# Patient Record
Sex: Male | Born: 1960 | Race: White | Hispanic: No | Smoking: Former smoker
Health system: Southern US, Community
[De-identification: ages and names within clinical notes are randomized; demographics above are authoritative.]

## PROBLEM LIST (undated history)

## (undated) DIAGNOSIS — F32A Depression, unspecified: Secondary | ICD-10-CM

## (undated) DIAGNOSIS — K219 Gastro-esophageal reflux disease without esophagitis: Secondary | ICD-10-CM

## (undated) DIAGNOSIS — E785 Hyperlipidemia, unspecified: Secondary | ICD-10-CM

## (undated) DIAGNOSIS — K449 Diaphragmatic hernia without obstruction or gangrene: Secondary | ICD-10-CM

## (undated) DIAGNOSIS — I2510 Atherosclerotic heart disease of native coronary artery without angina pectoris: Secondary | ICD-10-CM

## (undated) DIAGNOSIS — G4730 Sleep apnea, unspecified: Secondary | ICD-10-CM

## (undated) DIAGNOSIS — F419 Anxiety disorder, unspecified: Secondary | ICD-10-CM

## (undated) DIAGNOSIS — F1910 Other psychoactive substance abuse, uncomplicated: Secondary | ICD-10-CM

## (undated) DIAGNOSIS — G8929 Other chronic pain: Secondary | ICD-10-CM

## (undated) DIAGNOSIS — J449 Chronic obstructive pulmonary disease, unspecified: Secondary | ICD-10-CM

## (undated) DIAGNOSIS — M1990 Unspecified osteoarthritis, unspecified site: Secondary | ICD-10-CM

## (undated) DIAGNOSIS — I10 Essential (primary) hypertension: Secondary | ICD-10-CM

## (undated) DIAGNOSIS — D869 Sarcoidosis, unspecified: Secondary | ICD-10-CM

## (undated) DIAGNOSIS — F329 Major depressive disorder, single episode, unspecified: Secondary | ICD-10-CM

## (undated) DIAGNOSIS — G35 Multiple sclerosis: Secondary | ICD-10-CM

## (undated) DIAGNOSIS — K222 Esophageal obstruction: Secondary | ICD-10-CM

## (undated) HISTORY — DX: Anxiety disorder, unspecified: F41.9

## (undated) HISTORY — DX: Sleep apnea, unspecified: G47.30

## (undated) HISTORY — DX: Diaphragmatic hernia without obstruction or gangrene: K44.9

## (undated) HISTORY — DX: Hyperlipidemia, unspecified: E78.5

## (undated) HISTORY — DX: Major depressive disorder, single episode, unspecified: F32.9

## (undated) HISTORY — DX: Depression, unspecified: F32.A

## (undated) HISTORY — DX: Esophageal obstruction: K22.2

## (undated) HISTORY — DX: Atherosclerotic heart disease of native coronary artery without angina pectoris: I25.10

## (undated) HISTORY — DX: Sarcoidosis, unspecified: D86.9

## (undated) HISTORY — DX: Other chronic pain: G89.29

---

## 2001-07-06 ENCOUNTER — Ambulatory Visit (HOSPITAL_COMMUNITY): Admission: RE | Admit: 2001-07-06 | Discharge: 2001-07-06 | Payer: Self-pay | Admitting: Internal Medicine

## 2001-07-06 ENCOUNTER — Encounter: Payer: Self-pay | Admitting: Internal Medicine

## 2003-10-05 HISTORY — PX: CARDIAC CATHETERIZATION: SHX172

## 2003-10-11 ENCOUNTER — Inpatient Hospital Stay (HOSPITAL_COMMUNITY): Admission: EM | Admit: 2003-10-11 | Discharge: 2003-10-14 | Payer: Self-pay | Admitting: Emergency Medicine

## 2005-05-01 ENCOUNTER — Emergency Department (HOSPITAL_COMMUNITY): Admission: EM | Admit: 2005-05-01 | Discharge: 2005-05-01 | Payer: Self-pay | Admitting: Emergency Medicine

## 2006-01-20 ENCOUNTER — Ambulatory Visit: Payer: Self-pay | Admitting: Orthopedic Surgery

## 2006-02-17 ENCOUNTER — Ambulatory Visit: Payer: Self-pay | Admitting: Orthopedic Surgery

## 2006-04-11 ENCOUNTER — Ambulatory Visit (HOSPITAL_COMMUNITY): Admission: RE | Admit: 2006-04-11 | Discharge: 2006-04-11 | Payer: Self-pay | Admitting: Family Medicine

## 2007-01-13 ENCOUNTER — Inpatient Hospital Stay (HOSPITAL_COMMUNITY): Admission: EM | Admit: 2007-01-13 | Discharge: 2007-01-14 | Payer: Self-pay | Admitting: Emergency Medicine

## 2007-01-20 ENCOUNTER — Emergency Department (HOSPITAL_COMMUNITY): Admission: EM | Admit: 2007-01-20 | Discharge: 2007-01-20 | Payer: Self-pay | Admitting: Emergency Medicine

## 2008-05-20 ENCOUNTER — Encounter: Payer: Self-pay | Admitting: Orthopedic Surgery

## 2008-10-04 DIAGNOSIS — G35 Multiple sclerosis: Secondary | ICD-10-CM

## 2008-10-04 HISTORY — DX: Multiple sclerosis: G35

## 2009-01-26 ENCOUNTER — Inpatient Hospital Stay (HOSPITAL_COMMUNITY): Admission: EM | Admit: 2009-01-26 | Discharge: 2009-01-30 | Payer: Self-pay | Admitting: Emergency Medicine

## 2009-02-05 ENCOUNTER — Encounter (HOSPITAL_COMMUNITY): Admission: RE | Admit: 2009-02-05 | Discharge: 2009-03-07 | Payer: Self-pay | Admitting: Neurology

## 2009-10-04 HISTORY — PX: COLONOSCOPY: SHX174

## 2009-12-15 ENCOUNTER — Encounter: Payer: Self-pay | Admitting: Internal Medicine

## 2009-12-17 ENCOUNTER — Ambulatory Visit: Payer: Self-pay | Admitting: Internal Medicine

## 2009-12-17 ENCOUNTER — Ambulatory Visit (HOSPITAL_COMMUNITY): Admission: RE | Admit: 2009-12-17 | Discharge: 2009-12-17 | Payer: Self-pay | Admitting: Internal Medicine

## 2010-09-15 ENCOUNTER — Encounter
Admission: RE | Admit: 2010-09-15 | Discharge: 2010-09-15 | Payer: Self-pay | Source: Home / Self Care | Attending: Neurology | Admitting: Neurology

## 2010-10-05 ENCOUNTER — Encounter
Admission: RE | Admit: 2010-10-05 | Discharge: 2010-11-03 | Payer: Self-pay | Source: Home / Self Care | Attending: Neurology | Admitting: Neurology

## 2010-11-03 NOTE — Letter (Signed)
Summary: TRIAGE ORDER  TRIAGE ORDER   Imported By: Ave Filter 12/15/2009 11:53:12  _____________________________________________________________________  External Attachment:    Type:   Image     Comment:   External Document

## 2011-01-13 LAB — CBC
HCT: 39.4 % (ref 39.0–52.0)
HCT: 40.6 % (ref 39.0–52.0)
HCT: 41.5 % (ref 39.0–52.0)
Hemoglobin: 14.3 g/dL (ref 13.0–17.0)
MCHC: 35 g/dL (ref 30.0–36.0)
MCHC: 35 g/dL (ref 30.0–36.0)
MCHC: 36 g/dL (ref 30.0–36.0)
MCV: 93 fL (ref 78.0–100.0)
MCV: 93.8 fL (ref 78.0–100.0)
Platelets: 175 10*3/uL (ref 150–400)
Platelets: 202 10*3/uL (ref 150–400)
RBC: 4.33 MIL/uL (ref 4.22–5.81)
RDW: 13.2 % (ref 11.5–15.5)
RDW: 13.3 % (ref 11.5–15.5)
WBC: 7.7 10*3/uL (ref 4.0–10.5)

## 2011-01-13 LAB — COMPREHENSIVE METABOLIC PANEL
ALT: 22 U/L (ref 0–53)
AST: 23 U/L (ref 0–37)
Albumin: 3.7 g/dL (ref 3.5–5.2)
Albumin: 3.9 g/dL (ref 3.5–5.2)
BUN: 11 mg/dL (ref 6–23)
BUN: 14 mg/dL (ref 6–23)
Calcium: 9.2 mg/dL (ref 8.4–10.5)
Chloride: 108 mEq/L (ref 96–112)
Chloride: 110 mEq/L (ref 96–112)
GFR calc Af Amer: >60 mL/min (ref >60)
GFR calc non Af Amer: >60 mL/min (ref >60)
Glucose, Bld: 147 mg/dL — ABNORMAL HIGH (ref 70–99)
Potassium: 3.4 mEq/L — ABNORMAL LOW (ref 3.5–5.1)
Potassium: 3.7 mEq/L (ref 3.5–5.1)
Sodium: 141 mEq/L (ref 135–145)
Total Protein: 6.1 g/dL (ref 6.0–8.3)

## 2011-01-13 LAB — MULTIPLE SCLEROSIS PANEL 2
Albumin Index: 5 (ref <9.0)
IgA MSPROF: <0.001 mg/dL (ref <0.010)
IgG Total CSF: 5.6 mg/dL (ref 0.5–6.1)
IgM Total: 57.8 mg/dL (ref 48.0–271.0)
IgM-CSF: 0.07 mg/dL (ref <0.10)
Myelin basic protein, csf: <2 ng/mL (ref <4.1)

## 2011-01-13 LAB — DIFFERENTIAL
Basophils Absolute: 0 10*3/uL (ref 0.0–0.1)
Basophils Relative: 0 % (ref 0–1)
Basophils Relative: 0 % (ref 0–1)
Basophils Relative: 0 % (ref 0–1)
Eosinophils Absolute: 0 10*3/uL (ref 0.0–0.7)
Eosinophils Absolute: 0.1 10*3/uL (ref 0.0–0.7)
Eosinophils Absolute: 0.1 10*3/uL (ref 0.0–0.7)
Eosinophils Relative: 0 % (ref 0–5)
Eosinophils Relative: 1 % (ref 0–5)
Lymphocytes Relative: 20 % (ref 12–46)
Lymphocytes Relative: 38 % (ref 12–46)
Lymphs Abs: 0.7 10*3/uL (ref 0.7–4.0)
Lymphs Abs: 2.5 10*3/uL (ref 0.7–4.0)
Monocytes Absolute: 0.6 10*3/uL (ref 0.1–1.0)
Monocytes Relative: 2 % — ABNORMAL LOW (ref 3–12)
Neutro Abs: 3.4 10*3/uL (ref 1.7–7.7)
Neutrophils Relative %: 50 % (ref 43–77)
Neutrophils Relative %: 93 % — ABNORMAL HIGH (ref 43–77)

## 2011-01-13 LAB — PROTIME-INR: INR: 1 (ref 0.00–1.49)

## 2011-01-13 LAB — BASIC METABOLIC PANEL
BUN: 11 mg/dL (ref 6–23)
BUN: 8 mg/dL (ref 6–23)
CO2: 23 mEq/L (ref 19–32)
CO2: 26 mEq/L (ref 19–32)
Chloride: 109 mEq/L (ref 96–112)
Creatinine, Ser: 0.92 mg/dL (ref 0.4–1.5)
GFR calc non Af Amer: >60 mL/min (ref >60)
Glucose, Bld: 104 mg/dL — ABNORMAL HIGH (ref 70–99)
Potassium: 3.6 mEq/L (ref 3.5–5.1)
Potassium: 3.7 mEq/L (ref 3.5–5.1)

## 2011-01-13 LAB — ETHANOL: Alcohol, Ethyl (B): <5 mg/dL (ref 0–10)

## 2011-01-13 LAB — APTT: aPTT: 28 seconds (ref 24–37)

## 2011-01-13 LAB — URINALYSIS, ROUTINE W REFLEX MICROSCOPIC
Hgb urine dipstick: NEGATIVE
Nitrite: NEGATIVE
Protein, ur: NEGATIVE mg/dL
Urobilinogen, UA: 0.2 mg/dL (ref 0.0–1.0)
pH: 5.5 (ref 5.0–8.0)

## 2011-01-13 LAB — RPR: RPR Ser Ql: NONREACTIVE

## 2011-01-13 LAB — RAPID URINE DRUG SCREEN, HOSP PERFORMED
Barbiturates: NOT DETECTED
Cocaine: POSITIVE — AB
Opiates: NOT DETECTED

## 2011-01-13 LAB — PROTEIN AND GLUCOSE, CSF
Glucose, CSF: 75 mg/dL (ref 43–76)
Total  Protein, CSF: 41 mg/dL (ref 15–45)

## 2011-01-13 LAB — FERRITIN: Ferritin: 184 ng/mL (ref 22–322)

## 2011-01-13 LAB — IRON: Iron: 69 ug/dL (ref 42–135)

## 2011-01-13 LAB — CSF CELL COUNT WITH DIFFERENTIAL

## 2011-01-13 LAB — ANA: Anti Nuclear Antibody(ANA): NEGATIVE

## 2011-01-13 LAB — TSH: TSH: 1.844 u[IU]/mL (ref 0.350–4.500)

## 2011-01-18 ENCOUNTER — Ambulatory Visit (HOSPITAL_COMMUNITY)
Admission: RE | Admit: 2011-01-18 | Discharge: 2011-01-18 | Disposition: A | Discharge status: Home / Self Care | Payer: Medicare Other | Source: Ambulatory Visit | Attending: Family Medicine | Admitting: Family Medicine

## 2011-01-18 ENCOUNTER — Other Ambulatory Visit (HOSPITAL_COMMUNITY): Payer: Self-pay | Admitting: Family Medicine

## 2011-01-18 DIAGNOSIS — M25511 Pain in right shoulder: Secondary | ICD-10-CM

## 2011-01-18 DIAGNOSIS — T1490XA Injury, unspecified, initial encounter: Secondary | ICD-10-CM

## 2011-01-18 DIAGNOSIS — M25519 Pain in unspecified shoulder: Secondary | ICD-10-CM | POA: Insufficient documentation

## 2011-02-16 NOTE — Consult Note (Signed)
NAME:  Chad Vasquez, Chad Vasquez               ACCOUNT NO.:  [id number]   MEDICAL RECORD NO.:  [id number]          PATIENT TYPE:  INP   LOCATION:  A332                          FACILITY:  [facility]   PHYSICIAN:  Kofi A. Gerilyn Pilgrim, M.D. DATE OF BIRTH:  30-Dec-1960   DATE OF CONSULTATION:  DATE OF DISCHARGE:                                 CONSULTATION   REASON FOR CONSULTATION:  Weakness of the upper extremities and legs.   The patient is a 50 year old white male who reports pain and pain up in  her right shoulder and the right upper extremity.  Additionally he  complains of flexure of the fingers on the right, symptoms have been  present for 3 days.  The patient does report over a 3-year history of  gait impairment which has impaired due his ability to be employed.  He  believes that the gait impairment has gotten worse.   PAST MEDICAL HISTORY:  Significant for:  1. Hypertension.  2. Pneumonia.   ADMISSION MEDICATION:  Atenolol 50 mg once daily.   ALLERGIES:  None known.   SOCIAL HISTORY:  Smokes, occasional drinker.  Apparently no illicit drug  use, although his urine drug screen shows positive marijuana.  The  patient is married.   REVIEW OF SYSTEMS:  As stated in the history of present illness,  otherwise unrevealing.   PHYSICAL EXAMINATION:  Shows a tall somewhat unkempt man in no acute  distress.  Temperature 97.4 pulse 64, respirations 20, blood pressure  136/83.  HEENT EVALUATION:  Neck is supple.  Head is normocephalic, atraumatic.  ABDOMEN:  Soft.  EXTREMITIES:  Some varicosities noted in the legs.  There may also be  evidence of track marks that raise the possibility of IV drug use.  MENTATION:  The patient is awake and alert.  Speech, language and  cognition are intact.  Cranial nerve evaluation:  Pupils are equal,  round, and reactive to light and accommodation.  Extraocular movements  are full.  Facial muscle strength is symmetric.  Tongue is midline.  Uvula midline.  Shoulder  shrugs normal.  MOTOR EXAMINATION:  Shows normal tone, bulk and strength of the upper  extremities.  Legs:  He has a significant left foot drop.  Strength is  graded at 3- on dorsiflexion.  Other motor groups of the legs are  normal.  Reflexes shows brisk responses, especially in the upper  extremities with finger flexures noted indicate spread.  Plantar  reflexes are, however, equivocal.  Coordination shows no rigidity.  There is no parkinsonism.  No tremors of dysmetria.  Gait is wide based,  slightly spastic,   LABORATORY EVALUATION:  Sodium 141, potassium 3.4, chloride 108, CO2 23,  glucose 147, BUN 14, creatinine 0.8, calcium 9.2.  Liver enzymes normal.  Urine drug screen positive for cocaine.  Urinalysis negative.  Head CT  scan is reviewed in person and shows left frontal hypodensity.   ASSESSMENT:  1. Gait impairment.  2. Acute right upper extremity weakness which seems mostly due to      pain, although the suggestion of upper and lower extremity weakness  suggests the possibility of cervical myelopathy.  Issue of stroke      was raised, but I do not think the presentation makes this high on      the differential, although it is always a possibility.  The patient      does seem to have significant hypodensity on imaging, especially in      the left frontal area.  This may indicate an old stroke, chronic      ischemia, or even demyelination process.   RECOMMENDATION:  We will order an MRI of the head and neck, and further  testing will depend on the initial workup.  We will also do extensive  blood testing which has been started already.  We will also add  homocysteine.      Kofi A. Gerilyn Pilgrim, M.D.  Electronically Signed     KAD/MEDQ  D:  01/27/2009  T:  01/27/2009  Job:  [id number]

## 2011-02-16 NOTE — Consult Note (Signed)
NAME:  Chad Vasquez, Chad Vasquez               ACCOUNT NO.:  [id number]   MEDICAL RECORD NO.:  [id number]          PATIENT TYPE:  INP   LOCATION:  A332                          FACILITY:  [facility]   PHYSICIAN:  Kofi A. Gerilyn Pilgrim, M.D. DATE OF BIRTH:  Jan 17, 1961   DATE OF CONSULTATION:  01/28/2009  DATE OF DISCHARGE:                                 CONSULTATION   FOLLOWUP NOTE:   SUBJECTIVE:  The patient has no new complaints, but he continues to have  weakness of the legs, difficulty ambulating.  The right arm pain has  improved, but testing vitamin B12 level it is somewhat on the lower side  at 301, PRP nonreactive, homocysteine level 8.9, ferritin 184, TSH  1.8244.   OBJECTIVE:  Temperature 97.7, respirations 18, heart rate 59, blood  pressure 126/72.  MRI of the cervical spine and brain is reviewed.   IMPRESSION:  There is a large single scene on flare imaging involving he  cervical spine extending the entire C3-C4 segment, more towards the  right.  There are multiple T1 signals seen in the brain with typical  orientation suggestive of MS plaque.  Several of these line up in a  linear fashion perpendicular to the ventricle.  There is also  periventricular lesions seen, especially on posterior horns bilaterally.  A total of approximately 17 discrete white matter signals are seen.  There are also hypodensities noted on T1 imaging, especially involving  the left frontal area where there is a large black hole seen there, seen  on at least three cuts.  There is also one seen in the posterior  occipital parietal area adjacent to the posterior horn.  The findings  are versutus of a multiple sclerosis.   ASSESSMENT/PLAN:  Multiple sclerosis.  Findings were discussed at length  with the patient. We will arrange for the patient to undergo a lumbar  spinal tap.  Would also start the patient on Solu-Medrol 1 g for three  days IV.  He will eventually need to be placed on a long-term  immunomodulating  agent which should be arranged, but there is going to  be significant limitation as the patient does not have insurance.  For  now, we will continue with steroids.      Kofi A. Gerilyn Pilgrim, M.D.  Electronically Signed     KAD/MEDQ  D:  01/28/2009  T:  01/28/2009  Job:  [id number]

## 2011-02-16 NOTE — Discharge Summary (Signed)
NAMEPRENTICE, SACKRIDER               ACCOUNT NO.:  [id number]   MEDICAL RECORD NO.:  [id number]          PATIENT TYPE:  INP   LOCATION:  A332                          FACILITY:  [facility]   PHYSICIAN:  Dorris Singh, DO    DATE OF BIRTH:  11/20/1960   DATE OF ADMISSION:  01/26/2009  DATE OF DISCHARGE:  04/29/2010LH                               DISCHARGE SUMMARY   PRIMARY CARE PHYSICIAN:  Dr. Nobie Putnam.   CONSULTS THAT WERE MADE:  Include Dr. Gerilyn Pilgrim.   ADMISSION DIAGNOSES:  Include:  1. Generalized body weakness.  2. Unsteady gait for several years' duration.  3. History of poorly controlled hypertension.  4. History of polysubstance abuse including tobacco, alcohol, cocaine,      marijuana.  A urine screen was positive for acute cocaine.   DISCHARGE DIAGNOSES:  1. Multiple sclerosis.  2. Unsteady gait.  3. History of poorly controlled hypertension.  4. History of polysubstance abuse.   TESTING THAT WAS DONE:  Includes radiology testing.  CT of the head  without contrast which demonstrated hypoattenuation and subcortical  periventricular deep white matter that could be due to chronic  microvascular disease or demyelinating process.  The appearance has not  markedly changes and there is no acute finding.  He had MRI of the spine  which shows findings suspicious for MS for the cervical cord of C3-C4.  Additional area of suspicion for cord involvement dorsally of T3-T2.  No  significant compressive disk pathology.  MRI of the brain on January 27, 2009, same thing and he had a fluoroscopic-guided lumbar puncture on  January 28, 2009.   HOSPITAL COURSE:  Patient was admitted to the service of [facility] with  the above diagnoses.  Dr. Gerilyn Pilgrim was consulted.  An MRI and MRA were  obtained as mentioned above.  Also, patient received a spinal tap while  he was here.   PERTINENT FINDINGS FOR HIS LABS:  He had a positive urine drug screen  for cocaine and had an RPR done which was  nonreactive.  An ESR which was  2.  His cerebrospinal fluid was within normal range.   HOSPITAL COURSE:  He was started on high-dose steroids.  Dr. Gerilyn Pilgrim  put him on IV Solu-Medrol 1000 mg every 24 hours.  At one point in time,  patient was concerned and he wanted to go home.  Discussed with him the  possibility of him staying for his last dose.  He was on it for 3 days.  Today, patient states that he is feeling much better.  He feels a little  bit more steady in his gait.  Also, he has talked with home health care  and case management.  They have decided to set him up for outpatient PT  here at the hospital.  They have more equipment for him to be able to  work at regaining his gait.   CURRENT VITALS:  For today are as follows:  Temperature 98.4.  Pulse 66.  Respirations 20.  Blood pressure 141/84.  GENERAL:  Patient is a Caucasian male who is well developed, well  nourished, no acute distress.  HEART:  Regular rate and rhythm.  LUNGS:  Clear to auscultation bilaterally.  ABDOMEN:  Soft, nontender, nondistended.  EXTREMITIES:  Positive pulses.   There are no labs ordered for today.   MEDICATIONS THAT HE WILL BE SENT HOME ON:  Include his atenolol 50 mg  p.o. daily.   DISCHARGE INSTRUCTIONS:  Are to increase his activity slowly, to be on a  low-sodium, heart-healthy diet, to see Dr. Gerilyn Pilgrim in 2 to 4 weeks and  Dr. Nobie Putnam within 1 to 7 days.  He is to continue outpatient PT and to  follow up with neurology as recommended.  The patient also mentioned  that he will probably try to obtain disability and he can do this  through his primary care office.   CONDITION AT DISCHARGE:  Stable.   DISPOSITION:  Will be to home.      Dorris Singh, DO  Electronically Signed     CB/MEDQ  D:  01/30/2009  T:  01/30/2009  Job:  [id number]

## 2011-02-16 NOTE — Group Therapy Note (Signed)
NAME:  Chad Vasquez, Chad Vasquez               ACCOUNT NO.:  [id number]   MEDICAL RECORD NO.:  [id number]          PATIENT TYPE:  INP   LOCATION:  A332                          FACILITY:  [facility]   PHYSICIAN:  Kofi A. Gerilyn Pilgrim, M.D. DATE OF BIRTH:  June 20, 1961   DATE OF PROCEDURE:  DATE OF DISCHARGE:                                 PROGRESS NOTE   The patient is sitting up in a chair.  He is awake and alert and does  not have any new complaints, although he reports having a lot of pain  involving the ankles essentially where he has significant varicose veins  there.  He is awake and alert and converses fairly well.  Again, the  ankles had significant varicose veins especially along the left  posterior area.  Spinal fluid results are reviewed and protein is 41,  glucose is 78, WBC 6, RBC 3, other CSF results are pending.  C-reactive  protein 0.   ASSESSMENT AND PLAN:  Multiple sclerosis.  Continue with the Solu-  Medrol.  He is going to get the second dose today, third dose tomorrow.  He could be discharged afterwards.  Continue with physical therapy.      Kofi A. Gerilyn Pilgrim, M.D.  Electronically Signed     KAD/MEDQ  D:  01/29/2009  T:  01/30/2009  Job:  [id number]

## 2011-02-16 NOTE — Group Therapy Note (Signed)
Chad Vasquez, Chad Vasquez               ACCOUNT NO.:  [id number]   MEDICAL RECORD NO.:  [id number]          PATIENT TYPE:  INP   LOCATION:  A332                          FACILITY:  [facility]   PHYSICIAN:  Dorris Singh, DO    DATE OF BIRTH:  06/02/1961   DATE OF PROCEDURE:  01/29/2009  DATE OF DISCHARGE:                                 PROGRESS NOTE   The patient was seen today.  Earlier, he was sitting.  He was  threatening to leave out against medical advice.  I discussed the case  with Dr. Gerilyn Pilgrim who stated that if he did want to leave, he could go  and have his steroid treatments done in the specialty clinic.  I gave  this option to the patient.  He stated that he would stay because he  lives in [place] and did not want to have to travel between here and  [place], so he will go ahead and stay.  I also discussed with him his  treatment plan for MS, and he will need to follow up with Dr. Gerilyn Pilgrim,  as well.  The patient stated an understanding.   CURRENT PHYSICAL EXAMINATION:  VITAL SIGNS:  Temperature 97.9, pulse 93,  respirations 20, blood pressure 90/50.  GENERAL:  The patient is a well-developed, well-nourished Caucasian male  who is in no acute distress.  Upon entering the room, the patient was  trying to ambulate.  He is unsteady in his gait.  HEART:  Regular rate and rhythm.  LUNGS:  Clear to auscultation bilaterally.  ABDOMEN:  Soft, nontender.  EXTREMITIES:  Positive pulses.  No edema, ecchymosis or cyanosis.   CURRENT LABORATORIES:  White count 13.0, hemoglobin 14.2, hematocrit  40.6, platelet count of 202.  Sodium 139, potassium 3.6, chloride 109,  CO2 of 23, glucose 178, BUN 8, creatinine 0.92.   ASSESSMENT AND PLAN:  Multiple sclerosis.  This was found on MRI.  The  patient will continue with current high-dose steroids IV as per  recommended by Dr. Gerilyn Pilgrim.  Tomorrow is his last day.  Also, we will  set up an appointment with him to see Dr. Gerilyn Pilgrim outpatient for his  long-term therapy.  Will continue to monitor him and change any therapy  as necessary.      Dorris Singh, DO  Electronically Signed     CB/MEDQ  D:  01/29/2009  T:  01/29/2009  Job:  [id number]

## 2011-02-16 NOTE — Group Therapy Note (Signed)
NAME:  Chad Vasquez, Chad Vasquez               ACCOUNT NO.:  [id number]   MEDICAL RECORD NO.:  [id number]          PATIENT TYPE:  INP   LOCATION:  A332                          FACILITY:  [facility]   PHYSICIAN:  Kofi A. Gerilyn Pilgrim, M.D. DATE OF BIRTH:  [birth year]   DATE OF PROCEDURE:  DATE OF DISCHARGE:  01/30/2009                                 PROGRESS NOTE   The patient reports that he actually has improved with the Solu-Medrol.  He still continues to have problems ambulating.  He is to receive his  last dose of Solu-Medrol today.  I did discuss with the patient again  that he should follow up for disability as MS is a progressive disease.  He does report that he has had what appears to be multiple spells of  weakness of the legs and upper extremities and with each case he has  gotten worse each time, which is pretty typical and classic for MS.  He  is to follow up in the office less than about 2-4 weeks, so we can  arrange for the patient to get on an immunomodulating therapy.      Kofi A. Gerilyn Pilgrim, M.D.  Electronically Signed     KAD/MEDQ  D:  01/30/2009  T:  01/30/2009  Job:  [id number]

## 2011-02-16 NOTE — H&P (Signed)
NAMEDAESEAN, LAZARZ               ACCOUNT NO.:  [id number]   MEDICAL RECORD NO.:  [id number]          PATIENT TYPE:  INP   LOCATION:  A332                          FACILITY:  [facility]   PHYSICIAN:  Margaretmary Dys, M.D.DATE OF BIRTH:  1961-08-09   DATE OF ADMISSION:  01/26/2009  DATE OF DISCHARGE:  LH                              HISTORY & PHYSICAL   ADMISSION DIAGNOSES:  1. Generalized body weakness.  2. Unsteady gait, of several years' duration.  3. History of poorly controlled hypertension.  4. History of polysubstance abuse, including tobacco, alcohol, cocaine      and marijuana.  Urine drug screen positive for cocaine today.   CHIEF COMPLAINT:  Generalized weakness.   HISTORY OF PRESENT ILLNESS:  Mr. Minahan is a 50 year old male who  presented to the emergency room complaining of generalized weakness, and  sometimes numbness in his lower extremities and also upper extremities.  He also describes, what appears to be, radiating pain of his right  shoulder and his right upper extremity.  Also, he feels that his legs go  numb on him; this all started about 3 years ago when he lost his job at  [employer].   The patient reports that he is really been having a lot of difficulty  getting around.  The patient is currently unemployed.   He has been collecting unemployment checks on and off.   The patient presented to the emergency room today.  Basically, he could  barely walk or get out the bed.   REVIEW OF SYSTEMS:  Is as mentioned.   HISTORY OF PRESENT ILLNESS:  The patient denies any weight loss.  No  headaches, dizziness or lightheadedness.  No nausea or vomiting.  The  patient denies any visual changes or spots in his eyes.  Denies any  double vision.  He has had no fevers or chills.  He denies any cough.   PAST MEDICAL HISTORY:  1. Hypertension.  2. History of pneumonia.   MEDICATIONS:  Tylenol 50 mg p.o. once a day.   ALLERGIES:  NO KNOWN DRUG  ALLERGIES.   SOCIAL HISTORY:  The patient smokes about 1-2 packs of cigarettes a day.  He does use marijuana and cocaine.  The patient lives with a male  partner.  He denies any history of homosexual activities.   FAMILY HISTORY:  No history of multiple sclerosis or ALS in the family.   PHYSICAL EXAMINATION:  The patient was conscious, alert, although  somewhat unkempt and chronically ill-looking.  VITAL SIGNS:  Blood pressure 136/83 with a pulse of 66, respiratory rate  was 18, temperature 97.5 degrees Fahrenheit, oxygen saturation was 98%  on room air.  HEENT:  Normocephalic, atraumatic.  Oral mucosa was moist.  No exudates  were noted.  NECK:  Supple.  No JVD or lymphadenopathy.  LUNGS:  Clear clinically.  Good air entry bilaterally.  HEART:  S1 and S2 regular.  No excessive gallops or rubs.  ABDOMEN:  Soft, nondistended.  Bowel sounds positive.  No masses  palpable.  EXTREMITIES:  Showed some varicose veins bilaterally.  CNS EXAM:  The patient was conscious, alert; was well oriented in time,  place and person.  He had no focal neurological deficits.  The patient  did give a subjective loss of sensation in the left lower extremity  greater than the right.  His reflexes were brisk in both lower  extremities.  The patient does not have any evidence of tremors.  He had  no pronator drift.   LABORATORY/DIAGNOSTIC DATA:  White blood cell count 7.7, hemoglobin  14.6, hematocrit 41.5, platelet count 223.  PT 13.5, INR 1.0.  Sodium  141, potassium 3.4, chloride 108, CO2 23, glucose 147, BUN 14,  creatinine 0.95.  AST 23, ALT 25.  Albumin 3.9, calcium 9.2.  Ammonia  level was reported to be 54.  Urine drug screen was positive for  cocaine.  Alcohol level was noted to be less than 5.  Urinalysis was  negative.  A CT scan of the brain suggestive of demyelinating disease.  There was  also some hypoattenuation in subcortical periventricular deep white  matter.   ASSESSMENT AND PLAN:   This is a 50 year old male who presented to the  emergency room with unsteady gait and generalized body weakness.  1. The patient will be admitted to medical floor.  My concerns are      especially for multiple sclerosis; symptoms are highly suggestive.  2. Will obtain a MRI/MRA of the brain and the neck to further evaluate      this.  3. Will request neurology to see the patient, as the patient may      require a spinal tap for oligoclonal bands.   Will put the patient on SCDs for DVT prophylaxis for now, in case the  patient will have a spinal tap.  Would defer to neurology, Dr. Gerilyn Pilgrim, for any additional testing that  is thus required.  I explained the plan to the patient and he verbalized  full understanding.      Margaretmary Dys, M.D.  Electronically Signed     AM/MEDQ  D:  01/27/2009  T:  01/27/2009  Job:  [id number]

## 2011-02-19 NOTE — Discharge Summary (Signed)
Chad Vasquez, Chad Vasquez               ACCOUNT NO.:  [id number]   MEDICAL RECORD NO.:  [id number]          PATIENT TYPE:  INP   LOCATION:  A220                          FACILITY:  [facility]   PHYSICIAN:  Corrie Mckusick, M.D.  DATE OF BIRTH:  06-Apr-1961   DATE OF ADMISSION:  01/13/2007  DATE OF DISCHARGE:  04/12/2008LH                               DISCHARGE SUMMARY   HISTORY PRESENTING ILLNESS AND PAST MEDICAL HISTORY:  Please see  admission H&P.   HOSPITAL COURSE:  A 50 year old gentleman with cough and questionable  bronchitis in a heavy smoker.  He is admitted to 2A for close monitoring  in telemetry due to the chest tightness.  We ruled him out from a  cardiac standpoint.  EKG was normal on admission.  We restarted his  Vasotec for hypertension.  We gave him Avelox 400 mg IV daily.  He was  started on Xopenex and Spiriva daily worker.  Worked on him not to  smoke.   The day after admission, seen by his primary doctor, Dr. Sherwood Gambler, who felt  like the patient was greatly improved and was ready for discharge.  He  was discharged home on:   1. Avelox 400 mg daily for 10 days.  2. Ventolin tablets two t.i.d.  3. Steroid Dosepak.  4. Albuterol.  5. He also was discharged on his Vasotec 10 mg daily.   DISCHARGE CONDITION:  Improved and stable.  Please see Dr. Sharyon Medicus note  on the day of discharge for details.      Corrie Mckusick, M.D.  Electronically Signed     JCG/MEDQ  D:  02/08/2007  T:  02/08/2007  Job:  [id number]

## 2011-02-19 NOTE — H&P (Signed)
NAMECLEBURN, MAIOLO               ACCOUNT NO.:  [id number]   MEDICAL RECORD NO.:  [id number]          PATIENT TYPE:  INP   LOCATION:  A220                          FACILITY:  [facility]   PHYSICIAN:  Corrie Mckusick, M.D.  DATE OF BIRTH:  1960-12-08   DATE OF ADMISSION:  01/13/2007  DATE OF DISCHARGE:  LH                              HISTORY & PHYSICAL   ADMITTING DIAGNOSIS:  Bronchitis and hypoxia.   HISTORY OF PRESENTING ILLNESS:  This is a 50 year old gentleman with  heavy smoking history who has also got hypertension who presents with  now 6-8 hours of cough, fever and achiness.  He said he woke up this  morning with quite a bit of congestion and cough and felt his whole body  aching.  He also had some mid chest pain that seemed to be more related  to when he coughed.  There was some mild shortness of breath.  There was  no radiation of this pain to the arm or to the neck.  No associated  nausea.  He said it was just more or less tightness and some mild  shortness of breath.   No sick contacts really, although he thought he might have been exposed  to someone with bronchitis a couple of weeks ago.  He did not have the  influenza vaccine this year.  He has also not been taking his  antihypertensives regularly.  He does smoke heavily as stated.   GI, GU review of systems are otherwise negative.   PAST MEDICAL HISTORY:  Hypertension.   PAST SURGICAL HISTORY:  Heart catheterization in January 2005 which  showed some partial, really nonsignificant disease with some mild  narrowing in around the 20% range.   MEDICATIONS:  Vasotec 10 mg daily.   ALLERGIES:  KEFLEX.   He smokes two packs a day as well as cigars, has cut down on his alcohol  intake although he does drink some alcohol, he states.  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   FAMILY HISTORY:  Significant for heart disease with his father having a  CABG in his 19s.  There is also some diabetes in the  family.   PHYSICAL EXAMINATION:  VITAL SIGNS:  Temperature 98.5, blood pressure  154/74 off medications, heart rate 92, respiratory rate 22, O2  saturation 93% on room air.  GENERAL:  When I saw him he was pleasant, talkative, felt somewhat  better after his antibiotics.  HEENT:  Nasopharynx clear with moist mucous membranes.  NECK:  Supple.  No lymphadenopathy.  CHEST:  There are some upper rhonchi heard throughout.  There are no  areas of rales or consolidation.  CARDIOVASCULAR:  Regular rate and rhythm, normal S1 and S2, no murmur,  gallops, rubs.  ABDOMEN:  Soft, nontender, nondistended.  EXTREMITIES:  No edema.  The pulses are +1 bilaterally.   LABORATORIES:  Sodium 135, potassium 3.6, chloride 105, bicarb 23,  glucose 126, BUN 6, creatinine 0.84.  Point-of-care markers were  negative.  White count is elevated at 22.9, hemoglobin of 14.4,  platelets  of 272, ANC of 21.7.  Urinalysis overall clear.  Blood gas on  room air showed a pH of 7.45, pCO2 of 35, pO2 of 59, bicarb of 24.   RADIOLOGY:  The chest x-ray was read as negative.  We decided to a chest  CT to rule out PE which was reportedly negative by the emergency  department.   ASSESSMENT AND PLAN:  This is a 50 year old gentleman with body aches,  cough and questionable bronchitis, possibly due to heavy smoking.   PLAN:  1. Admit to 2A for close monitoring including telemetry due to the      chest tightness.  2. Will rule out from a cardiac standpoint.  It should be noted that      his EKG in the emergency department was completely normal per the      ER physician.  3. Restart his Vasotec at 10 mg daily.  4. On the antibiotics he received Rocephin and azithromycin initially      but will change this to Avelox 400 mg daily due to a CEPHALOSPORIN      allergy.  5. Will start Xopenex nebulizers q.8h.  6. Start Spiriva daily.  7. With do an influenza test as this could be quite possibly the flu.      Will hold off on  using antivirals until this result returns.  8. Will work on him to not smoke.  9. Have talked to him about his alcohol intake.  He has had no history      of DTs and says that he is drinking much less now, so he is not      that concerned that he would have any signs or symptoms of      withdrawal, but we will watch this closely of course.   Will continue follow closely.      Corrie Mckusick, M.D.  Electronically Signed     JCG/MEDQ  D:  01/13/2007  T:  01/14/2007  Job:  [id number]

## 2011-02-19 NOTE — Discharge Summary (Signed)
NAME:  Chad Vasquez, Chad Vasquez                         ACCOUNT NO.:  [id number]   MEDICAL RECORD NO.:  [id number]                   PATIENT TYPE:  INP   LOCATION:  3734                                 FACILITY:  [facility]   PHYSICIAN:  Vida Roller, M.D.                DATE OF BIRTH:  08-23-1961   DATE OF ADMISSION:  10/11/2003  DATE OF DISCHARGE:  10/14/2003                           DISCHARGE SUMMARY - REFERRING   REASON FOR ADMISSION:  Please refer to dictated admission history noted.   LABORATORY DATA:  Serial cardiac markers normal.  Lipid profile:  Total  cholesterol 138, triglycerides 162, HDL 35, LDL 71 (cholesterol/HDL ratio  3.9).  CBC normal at discharge.  Electrolytes and renal function normal.  Normal liver enzymes.   ADMISSION CHEST X-RAY:  Left base atelectasis.  Tiny right upper lobe nodule  (probable granuloma).  Followup 59-month chest x-ray recommended.   HOSPITAL COURSE:  Following presentation to the emergency room with a  complaint of a one-week history of intermittent chest tightness and  progressive exertional dyspnea, patient ruled out for myocardial infarction  with all serial cardiac markers within normal limits.  He was maintained on  aspirin, beta blocker, and intravenous heparin with plans to proceed with  diagnostic coronary angiography.  He was also started on low-dose statin.   The patient remained stable over the weekend with no complaint of chest pain  and underwent coronary angiography on Monday, January 10th (see report for  full details) with procedure performed with Dr. Veneda Melter.  The patient  was found to have mild, noncritical coronary artery disease with preserved  left ventricular function.  There was 20% distal along the LCX, mild LAD and  first diagonal disease, and 50% proximal with 30% mid and distal RCA  disease.  Dr. Chales Abrahams recommended medical therapy and treatment with a proton  pump inhibitor for six weeks.   The patient was cleared for  discharge later that same day, ambulating, and  with no complications of the right groin incision site.   DISCHARGE MEDICATIONS:  1. Protonix 40 mg daily x6 weeks.  2. Coated aspirin 81 mg daily.  3. Metoprolol 50 mg daily.  4. Lotrel 5/10 mg daily.  5. Lipitor 10 mg daily.  6. Nitrostat 0.4 mg p.r.n.   INSTRUCTIONS:  No heavy lifting x2 days.  Maintain a low fat, low  cholesterol diet.  Call the office if there is any swelling/bleeding of the  groin.   The patient is instructed to arrange followup with his primary care  physician, Dr. Patrica Duel, in the next two weeks.  He will return to Dr.  Vida Roller on an as-needed basis.   DISCHARGE DIAGNOSES:  1. Nonischemic chest pain.     A. Normal serial cardiac markers.     B. Nonobstructive coronary artery disease, preserved left ventricular        function.  Coronary angiogram on January  10th.  2. Tiny right apical nodular opacity.     A. Probable calcified granuloma.  Recommend follow-up chest x-ray in        three months.  3. History of hypertension.  4. History of palpitations.     A. Treated with Lopressor.  5. Longstanding tobacco.  6. Mixed dyslipidemia.      Gene Serpe, P.A. [facility]                      Vida Roller, M.D.    GS/MEDQ  D:  10/14/2003  T:  10/14/2003  Job:  [id number]   cc:   Patrica Duel, M.D.  [address]  [place]  [place] [address]  Fax: [phone]
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
## 2011-02-19 NOTE — Cardiovascular Report (Signed)
NAME:  TANIS, BURNLEY                         ACCOUNT NO.:  [id number]   MEDICAL RECORD NO.:  [id number]                   PATIENT TYPE:  INP   LOCATION:  3734                                 FACILITY:  [facility]   PHYSICIAN:  Veneda Melter, M.D.                   DATE OF BIRTH:  [birth year]   DATE OF PROCEDURE:  10/14/2003  DATE OF DISCHARGE:                              CARDIAC CATHETERIZATION   PROCEDURES PERFORMED:  1. Left heart catheterization.  2. Left ventriculogram.  3. Selective coronary angiography.  4. AngioSeal right femoral artery.   DIAGNOSES:  1. Mild coronary artery disease by angiogram.  2. Normal left ventricular systolic function.   HISTORY:  Mr. Ferrando is a 50 year old gentleman with history of tobacco  use, strong family history of coronary disease who presents with severe  substernal chest discomfort.  The patient was admitted to the hospital,  ruled out for acute myocardial infarction.  Presents now for further  assessment.   TECHNIQUE:  Informed consent was obtained.  The patient brought to the  catheterization laboratory.  A 6-French sheath placed in the right femoral  artery using modified Seldinger technique.  A 6-French JL4 and JR4 catheter  was then used to engage the left and right coronary arteries.  Selective  angiography performed in various projections using manual injections of  contrast.  A 6-French pigtail catheter was then advanced in the left  ventricle and left ventriculogram performed using power injections of  contrast.  At the termination of case catheters and sheath were removed and  AngioSeal suture closure device deployed to the right femoral artery until  adequate hemostasis achieved.  The patient tolerated procedure well and was  transferred to floor in stable condition.   FINDINGS:  1. Left main trunk:  Medium caliber vessel.  Mild distal narrowing of 20%.  2. LAD:  This is a medium caliber vessel.  Provides a large first  diagonal     branch in the proximal segment.  The LAD has mild diffuse disease of 20%.  3. Left circumflex artery:  Codominant.  This is a large caliber vessel.     Provides three distal posterior ventricular branches.  The left     circumflex has mild disease of 30% in the mid AV segment.  4. Ramus intermedius:  Small caliber vessel with moderate irregularities.  5. Right coronary artery is dominant.  This is a large caliber vessel that     provides a posterior descending artery in its terminal segment.  The     right coronary artery has moderate concentric narrowing of 50% in the     proximal segment.  There is then further disease of 30% in the mid and     distal segments.  6. LV:  Normal end-systolic and end-diastolic dimensions.  Overall left     ventricular function is well preserved.  Ejection fraction greater than  55%.  No mitral regurgitation.  LV pressure is 120/10.  Aortic is 120/80.     LVEDP equals 20.   ASSESSMENT/PLAN:  Mr. Santucci is a 50 year old gentleman with noncritical  coronary artery disease and well preserved left ventricular function.  Continued medical therapy will be pursued and other cause of chest pain  investigated.                                               Veneda Melter, M.D.    NG/MEDQ  D:  10/14/2003  T:  10/14/2003  Job:  [id number]   cc:   Patrica Duel, M.D.  [address]  [place]  [place] [address]  Fax: [phone]

## 2011-06-14 ENCOUNTER — Other Ambulatory Visit (HOSPITAL_COMMUNITY): Payer: Self-pay | Admitting: Family Medicine

## 2011-06-14 ENCOUNTER — Ambulatory Visit (HOSPITAL_COMMUNITY)
Admission: RE | Admit: 2011-06-14 | Discharge: 2011-06-14 | Disposition: A | Discharge status: Home / Self Care | Payer: Medicare Other | Source: Ambulatory Visit | Attending: Family Medicine | Admitting: Family Medicine

## 2011-06-14 DIAGNOSIS — R079 Chest pain, unspecified: Secondary | ICD-10-CM

## 2011-06-14 DIAGNOSIS — F411 Generalized anxiety disorder: Secondary | ICD-10-CM

## 2011-06-14 DIAGNOSIS — R05 Cough: Secondary | ICD-10-CM | POA: Insufficient documentation

## 2011-06-14 DIAGNOSIS — R0789 Other chest pain: Secondary | ICD-10-CM | POA: Insufficient documentation

## 2011-06-14 DIAGNOSIS — R0602 Shortness of breath: Secondary | ICD-10-CM | POA: Insufficient documentation

## 2011-06-14 DIAGNOSIS — R059 Cough, unspecified: Secondary | ICD-10-CM | POA: Insufficient documentation

## 2011-06-14 DIAGNOSIS — I10 Essential (primary) hypertension: Secondary | ICD-10-CM

## 2011-10-05 DIAGNOSIS — F1910 Other psychoactive substance abuse, uncomplicated: Secondary | ICD-10-CM

## 2011-10-05 HISTORY — DX: Other psychoactive substance abuse, uncomplicated: F19.10

## 2012-02-23 ENCOUNTER — Other Ambulatory Visit (HOSPITAL_COMMUNITY): Payer: Self-pay | Admitting: Family Medicine

## 2012-02-23 DIAGNOSIS — M545 Low back pain, unspecified: Secondary | ICD-10-CM

## 2012-02-23 DIAGNOSIS — I10 Essential (primary) hypertension: Secondary | ICD-10-CM

## 2012-02-23 DIAGNOSIS — E119 Type 2 diabetes mellitus without complications: Secondary | ICD-10-CM

## 2012-02-23 DIAGNOSIS — E785 Hyperlipidemia, unspecified: Secondary | ICD-10-CM

## 2012-02-25 ENCOUNTER — Ambulatory Visit (HOSPITAL_COMMUNITY)
Admission: RE | Admit: 2012-02-25 | Discharge: 2012-02-25 | Disposition: A | Discharge status: Home / Self Care | Payer: Medicare Other | Source: Ambulatory Visit | Attending: Family Medicine | Admitting: Family Medicine

## 2012-02-25 ENCOUNTER — Other Ambulatory Visit (HOSPITAL_COMMUNITY): Payer: Self-pay | Admitting: Family Medicine

## 2012-02-25 DIAGNOSIS — M545 Low back pain, unspecified: Secondary | ICD-10-CM

## 2012-02-25 DIAGNOSIS — M5126 Other intervertebral disc displacement, lumbar region: Secondary | ICD-10-CM | POA: Insufficient documentation

## 2012-02-25 DIAGNOSIS — R209 Unspecified disturbances of skin sensation: Secondary | ICD-10-CM | POA: Insufficient documentation

## 2012-02-25 DIAGNOSIS — M48061 Spinal stenosis, lumbar region without neurogenic claudication: Secondary | ICD-10-CM | POA: Insufficient documentation

## 2012-02-25 MED ORDER — GADOBENATE DIMEGLUMINE 529 MG/ML IV SOLN
20.0000 mL | Freq: Once | INTRAVENOUS | Status: AC | PRN
  Administered 2012-02-25: 20 mL via INTRAVENOUS

## 2012-03-14 ENCOUNTER — Ambulatory Visit: Payer: Medicare Other | Attending: Neurosurgery | Admitting: Physical Therapy

## 2012-03-14 DIAGNOSIS — IMO0001 Reserved for inherently not codable concepts without codable children: Secondary | ICD-10-CM | POA: Insufficient documentation

## 2012-03-14 DIAGNOSIS — M545 Low back pain, unspecified: Secondary | ICD-10-CM | POA: Insufficient documentation

## 2012-03-14 DIAGNOSIS — R5381 Other malaise: Secondary | ICD-10-CM | POA: Insufficient documentation

## 2012-05-07 ENCOUNTER — Emergency Department (HOSPITAL_COMMUNITY): Payer: Medicare Other

## 2012-05-07 ENCOUNTER — Emergency Department (HOSPITAL_COMMUNITY)
Admission: EM | Admit: 2012-05-07 | Discharge: 2012-05-07 | Disposition: A | Discharge status: Home / Self Care | Payer: Medicare Other | Attending: Emergency Medicine | Admitting: Emergency Medicine

## 2012-05-07 ENCOUNTER — Encounter (HOSPITAL_COMMUNITY): Payer: Self-pay | Admitting: Emergency Medicine

## 2012-05-07 ENCOUNTER — Inpatient Hospital Stay (HOSPITAL_COMMUNITY)
Admission: EM | Admit: 2012-05-07 | Discharge: 2012-05-20 | DRG: 167 | Disposition: A | Discharge status: Skilled Nursing Facility | Payer: Medicare Other | Attending: Internal Medicine | Admitting: Internal Medicine

## 2012-05-07 DIAGNOSIS — F1410 Cocaine abuse, uncomplicated: Secondary | ICD-10-CM | POA: Diagnosis present

## 2012-05-07 DIAGNOSIS — K5900 Constipation, unspecified: Secondary | ICD-10-CM | POA: Diagnosis present

## 2012-05-07 DIAGNOSIS — G8929 Other chronic pain: Secondary | ICD-10-CM | POA: Diagnosis present

## 2012-05-07 DIAGNOSIS — R509 Fever, unspecified: Secondary | ICD-10-CM | POA: Insufficient documentation

## 2012-05-07 DIAGNOSIS — G609 Hereditary and idiopathic neuropathy, unspecified: Secondary | ICD-10-CM | POA: Diagnosis present

## 2012-05-07 DIAGNOSIS — Z720 Tobacco use: Secondary | ICD-10-CM

## 2012-05-07 DIAGNOSIS — R531 Weakness: Secondary | ICD-10-CM

## 2012-05-07 DIAGNOSIS — I10 Essential (primary) hypertension: Secondary | ICD-10-CM | POA: Diagnosis present

## 2012-05-07 DIAGNOSIS — M79606 Pain in leg, unspecified: Secondary | ICD-10-CM | POA: Diagnosis present

## 2012-05-07 DIAGNOSIS — W19XXXA Unspecified fall, initial encounter: Secondary | ICD-10-CM | POA: Insufficient documentation

## 2012-05-07 DIAGNOSIS — J449 Chronic obstructive pulmonary disease, unspecified: Secondary | ICD-10-CM | POA: Diagnosis present

## 2012-05-07 DIAGNOSIS — D869 Sarcoidosis, unspecified: Principal | ICD-10-CM | POA: Diagnosis present

## 2012-05-07 DIAGNOSIS — F3289 Other specified depressive episodes: Secondary | ICD-10-CM | POA: Diagnosis present

## 2012-05-07 DIAGNOSIS — R0682 Tachypnea, not elsewhere classified: Secondary | ICD-10-CM | POA: Diagnosis not present

## 2012-05-07 DIAGNOSIS — K739 Chronic hepatitis, unspecified: Secondary | ICD-10-CM | POA: Diagnosis present

## 2012-05-07 DIAGNOSIS — F329 Major depressive disorder, single episode, unspecified: Secondary | ICD-10-CM | POA: Diagnosis present

## 2012-05-07 DIAGNOSIS — G35 Multiple sclerosis: Secondary | ICD-10-CM | POA: Diagnosis present

## 2012-05-07 DIAGNOSIS — F1210 Cannabis abuse, uncomplicated: Secondary | ICD-10-CM | POA: Diagnosis present

## 2012-05-07 DIAGNOSIS — IMO0002 Reserved for concepts with insufficient information to code with codable children: Secondary | ICD-10-CM

## 2012-05-07 DIAGNOSIS — F17200 Nicotine dependence, unspecified, uncomplicated: Secondary | ICD-10-CM | POA: Diagnosis present

## 2012-05-07 DIAGNOSIS — Y92009 Unspecified place in unspecified non-institutional (private) residence as the place of occurrence of the external cause: Secondary | ICD-10-CM | POA: Insufficient documentation

## 2012-05-07 DIAGNOSIS — F1910 Other psychoactive substance abuse, uncomplicated: Secondary | ICD-10-CM | POA: Diagnosis present

## 2012-05-07 DIAGNOSIS — R000 Tachycardia, unspecified: Secondary | ICD-10-CM | POA: Diagnosis not present

## 2012-05-07 DIAGNOSIS — G8220 Paraplegia, unspecified: Secondary | ICD-10-CM | POA: Diagnosis present

## 2012-05-07 DIAGNOSIS — R5381 Other malaise: Secondary | ICD-10-CM | POA: Diagnosis present

## 2012-05-07 DIAGNOSIS — R51 Headache: Secondary | ICD-10-CM

## 2012-05-07 DIAGNOSIS — Z79899 Other long term (current) drug therapy: Secondary | ICD-10-CM | POA: Insufficient documentation

## 2012-05-07 DIAGNOSIS — E876 Hypokalemia: Secondary | ICD-10-CM | POA: Diagnosis not present

## 2012-05-07 HISTORY — DX: Essential (primary) hypertension: I10

## 2012-05-07 HISTORY — DX: Other psychoactive substance abuse, uncomplicated: F19.10

## 2012-05-07 HISTORY — DX: Multiple sclerosis: G35

## 2012-05-07 LAB — CBC WITH DIFFERENTIAL/PLATELET
Band Neutrophils: 0 % (ref 0–10)
Blasts: 0 %
Lymphocytes Relative: 9 % — ABNORMAL LOW (ref 12–46)
Lymphocytes Relative: 8 % — ABNORMAL LOW (ref 12–46)
Lymphs Abs: 1.1 10*3/uL (ref 0.7–4.0)
Lymphs Abs: 0.9 10*3/uL (ref 0.7–4.0)
MCH: 31.8 pg (ref 26.0–34.0)
MCHC: 34.1 g/dL (ref 30.0–36.0)
MCV: 93.1 fL (ref 78.0–100.0)
Myelocytes: 0 %
Neutro Abs: 10.3 10*3/uL — ABNORMAL HIGH (ref 1.7–7.7)
Neutro Abs: 9.7 10*3/uL — ABNORMAL HIGH (ref 1.7–7.7)
Neutrophils Relative %: 82 % — ABNORMAL HIGH (ref 43–77)
Neutrophils Relative %: 86 % — ABNORMAL HIGH (ref 43–77)
Platelets: 180 10*3/uL (ref 150–400)
Promyelocytes Absolute: 0 %
RBC: 4.2 MIL/uL — ABNORMAL LOW (ref 4.22–5.81)
RDW: 13.3 % (ref 11.5–15.5)
WBC: 12.6 10*3/uL — ABNORMAL HIGH (ref 4.0–10.5)

## 2012-05-07 LAB — URINALYSIS, ROUTINE W REFLEX MICROSCOPIC
Bilirubin Urine: NEGATIVE
Leukocytes, UA: NEGATIVE
Nitrite: NEGATIVE
Specific Gravity, Urine: 1.02 (ref 1.005–1.030)
Urobilinogen, UA: 0.2 mg/dL (ref 0.0–1.0)
pH: 6.5 (ref 5.0–8.0)

## 2012-05-07 LAB — COMPREHENSIVE METABOLIC PANEL
ALT: 37 U/L (ref 0–53)
Alkaline Phosphatase: 69 U/L (ref 39–117)
CO2: 25 mEq/L (ref 19–32)
Chloride: 102 mEq/L (ref 96–112)
GFR calc Af Amer: 89 mL/min — ABNORMAL LOW (ref >90)
GFR calc non Af Amer: 77 mL/min — ABNORMAL LOW (ref >90)
Glucose, Bld: 114 mg/dL — ABNORMAL HIGH (ref 70–99)
Potassium: 3.9 mEq/L (ref 3.5–5.1)
Sodium: 138 mEq/L (ref 135–145)

## 2012-05-07 LAB — BASIC METABOLIC PANEL
CO2: 24 mEq/L (ref 19–32)
Calcium: 9.2 mg/dL (ref 8.4–10.5)
Chloride: 101 mEq/L (ref 96–112)
Glucose, Bld: 117 mg/dL — ABNORMAL HIGH (ref 70–99)
Sodium: 135 mEq/L (ref 135–145)

## 2012-05-07 LAB — LACTIC ACID, PLASMA: Lactic Acid, Venous: 1.4 mmol/L (ref 0.5–2.2)

## 2012-05-07 LAB — RAPID URINE DRUG SCREEN, HOSP PERFORMED
Barbiturates: NOT DETECTED
Tetrahydrocannabinol: NOT DETECTED

## 2012-05-07 LAB — PROCALCITONIN: Procalcitonin: 0.35 ng/mL

## 2012-05-07 LAB — D-DIMER, QUANTITATIVE: D-Dimer, Quant: 0.58 ug/mL-FEU — ABNORMAL HIGH (ref 0.00–0.48)

## 2012-05-07 MED ORDER — KETOROLAC TROMETHAMINE 30 MG/ML IJ SOLN
30.0000 mg | Freq: Once | INTRAMUSCULAR | Status: AC
  Administered 2012-05-07: 30 mg via INTRAVENOUS
  Filled 2012-05-07: qty 1

## 2012-05-07 MED ORDER — DOCUSATE SODIUM 100 MG PO CAPS
100.0000 mg | ORAL_CAPSULE | Freq: Two times a day (BID) | ORAL | Status: DC
  Administered 2012-05-07 – 2012-05-20 (×25): 100 mg via ORAL
  Filled 2012-05-07 (×27): qty 1

## 2012-05-07 MED ORDER — ONDANSETRON HCL 4 MG/2ML IJ SOLN
4.0000 mg | Freq: Once | INTRAMUSCULAR | Status: AC
  Administered 2012-05-07: 4 mg via INTRAVENOUS
  Filled 2012-05-07: qty 2

## 2012-05-07 MED ORDER — ACETAMINOPHEN 500 MG PO TABS
1000.0000 mg | ORAL_TABLET | Freq: Once | ORAL | Status: AC
  Administered 2012-05-07: 1000 mg via ORAL
  Filled 2012-05-07: qty 2

## 2012-05-07 MED ORDER — SODIUM CHLORIDE 0.9 % IV SOLN
INTRAVENOUS | Status: DC
  Administered 2012-05-07 – 2012-05-14 (×11): via INTRAVENOUS
  Administered 2012-05-15: 1000 mL via INTRAVENOUS
  Administered 2012-05-15: 23:00:00 via INTRAVENOUS
  Administered 2012-05-16: 20 mL/h via INTRAVENOUS

## 2012-05-07 MED ORDER — MORPHINE SULFATE 4 MG/ML IJ SOLN
2.0000 mg | Freq: Once | INTRAMUSCULAR | Status: AC
  Administered 2012-05-07: 4 mg via INTRAVENOUS
  Filled 2012-05-07: qty 1

## 2012-05-07 MED ORDER — SODIUM CHLORIDE 0.9 % IV BOLUS (SEPSIS)
1000.0000 mL | Freq: Once | INTRAVENOUS | Status: AC
  Administered 2012-05-07: 1000 mL via INTRAVENOUS

## 2012-05-07 MED ORDER — VITAMIN D 1000 UNITS PO TABS
2000.0000 [IU] | ORAL_TABLET | ORAL | Status: DC
  Administered 2012-05-08 – 2012-05-20 (×13): 2000 [IU] via ORAL
  Filled 2012-05-07 (×15): qty 2
  Filled 2012-05-07: qty 1

## 2012-05-07 MED ORDER — CARBAMAZEPINE 200 MG PO TABS
200.0000 mg | ORAL_TABLET | Freq: Three times a day (TID) | ORAL | Status: DC
  Administered 2012-05-07 – 2012-05-20 (×37): 200 mg via ORAL
  Filled 2012-05-07 (×40): qty 1

## 2012-05-07 MED ORDER — HYDROCODONE-ACETAMINOPHEN 10-325 MG PO TABS
1.0000 | ORAL_TABLET | ORAL | Status: DC | PRN
  Administered 2012-05-08 – 2012-05-15 (×11): 1 via ORAL
  Filled 2012-05-07 (×11): qty 1

## 2012-05-07 MED ORDER — METHYLPREDNISOLONE SODIUM SUCC 1000 MG IJ SOLR
INTRAMUSCULAR | Status: AC
  Filled 2012-05-07: qty 8

## 2012-05-07 MED ORDER — IBUPROFEN 400 MG PO TABS
400.0000 mg | ORAL_TABLET | Freq: Once | ORAL | Status: AC
  Administered 2012-05-07: 400 mg via ORAL
  Filled 2012-05-07: qty 1

## 2012-05-07 MED ORDER — GLATIRAMER ACETATE 20 MG/ML ~~LOC~~ KIT
20.0000 mg | PACK | SUBCUTANEOUS | Status: DC
  Filled 2012-05-07 (×3): qty 1

## 2012-05-07 MED ORDER — SODIUM CHLORIDE 0.9 % IV SOLN: INTRAVENOUS | Status: DC

## 2012-05-07 MED ORDER — ASPIRIN EC 81 MG PO TBEC
81.0000 mg | DELAYED_RELEASE_TABLET | Freq: Every day | ORAL | Status: DC
  Administered 2012-05-07 – 2012-05-20 (×13): 81 mg via ORAL
  Filled 2012-05-07 (×14): qty 1

## 2012-05-07 MED ORDER — ONDANSETRON HCL 4 MG PO TABS: 4.0000 mg | ORAL_TABLET | Freq: Four times a day (QID) | ORAL | Status: DC | PRN

## 2012-05-07 MED ORDER — GABAPENTIN 300 MG PO CAPS
600.0000 mg | ORAL_CAPSULE | Freq: Three times a day (TID) | ORAL | Status: DC
  Administered 2012-05-07 – 2012-05-20 (×37): 600 mg via ORAL
  Filled 2012-05-07 (×41): qty 2

## 2012-05-07 MED ORDER — ONDANSETRON HCL 4 MG/2ML IJ SOLN: 4.0000 mg | Freq: Three times a day (TID) | INTRAMUSCULAR | Status: AC | PRN

## 2012-05-07 MED ORDER — ONDANSETRON HCL 4 MG PO TABS: 4.0000 mg | ORAL_TABLET | Freq: Four times a day (QID) | ORAL | Status: AC

## 2012-05-07 MED ORDER — SODIUM CHLORIDE 0.9 % IV SOLN: Freq: Once | INTRAVENOUS | Status: DC

## 2012-05-07 MED ORDER — ONDANSETRON HCL 4 MG/2ML IJ SOLN
4.0000 mg | Freq: Four times a day (QID) | INTRAMUSCULAR | Status: DC | PRN
  Administered 2012-05-14: 4 mg via INTRAVENOUS
  Filled 2012-05-07: qty 2

## 2012-05-07 MED ORDER — SODIUM CHLORIDE 0.9 % IV SOLN
1000.0000 mg | INTRAVENOUS | Status: DC
  Administered 2012-05-07 – 2012-05-11 (×5): 1000 mg via INTRAVENOUS
  Filled 2012-05-07 (×8): qty 8

## 2012-05-07 MED ORDER — ZOLPIDEM TARTRATE 5 MG PO TABS
5.0000 mg | ORAL_TABLET | Freq: Every evening | ORAL | Status: DC | PRN
  Administered 2012-05-07: 5 mg via ORAL
  Filled 2012-05-07 (×2): qty 1

## 2012-05-07 MED ORDER — GABAPENTIN 300 MG PO CAPS: 300.0000 mg | ORAL_CAPSULE | Freq: Three times a day (TID) | ORAL | Status: DC

## 2012-05-07 MED ORDER — ACETAMINOPHEN 325 MG PO TABS
650.0000 mg | ORAL_TABLET | Freq: Four times a day (QID) | ORAL | Status: DC | PRN
  Administered 2012-05-09 – 2012-05-11 (×5): 650 mg via ORAL
  Filled 2012-05-07 (×5): qty 2

## 2012-05-07 MED ORDER — ENOXAPARIN SODIUM 40 MG/0.4ML ~~LOC~~ SOLN
40.0000 mg | SUBCUTANEOUS | Status: DC
  Administered 2012-05-07 – 2012-05-09 (×3): 40 mg via SUBCUTANEOUS
  Filled 2012-05-07 (×3): qty 0.4

## 2012-05-07 MED ORDER — ALUM & MAG HYDROXIDE-SIMETH 200-200-20 MG/5ML PO SUSP: 30.0000 mL | Freq: Four times a day (QID) | ORAL | Status: DC | PRN

## 2012-05-07 MED ORDER — SODIUM CHLORIDE 0.9 % IV SOLN
INTRAVENOUS | Status: AC
  Administered 2012-05-07 – 2012-05-08 (×2): 1000 mL via INTRAVENOUS

## 2012-05-07 MED ORDER — ACETAMINOPHEN 650 MG RE SUPP: 650.0000 mg | Freq: Four times a day (QID) | RECTAL | Status: DC | PRN

## 2012-05-07 MED ORDER — POLYETHYLENE GLYCOL 3350 17 G PO PACK
17.0000 g | PACK | Freq: Every day | ORAL | Status: DC | PRN
  Filled 2012-05-07: qty 1

## 2012-05-07 MED ORDER — SODIUM CHLORIDE 0.9 % IJ SOLN
3.0000 mL | Freq: Two times a day (BID) | INTRAMUSCULAR | Status: DC
  Administered 2012-05-08 – 2012-05-20 (×12): 3 mL via INTRAVENOUS
  Filled 2012-05-07 (×3): qty 3

## 2012-05-07 NOTE — ED Notes (Signed)
Pt's SO to be called if any changes in pt's condition. [name] [phone].

## 2012-05-07 NOTE — ED Notes (Signed)
Correction to urine Drugs of abuse screen, specimen collected via cath.

## 2012-05-07 NOTE — ED Notes (Signed)
Unable to give report to floor a present, RN to call.

## 2012-05-07 NOTE — H&P (Signed)
[facility] History and Physical  Chad Vasquez JXB:[id number] DOB: [birth year] DOA: 05/07/2012  Referring physician: EDP PCP: Colette Ribas, MD   Chief Complaint: Fever, Lower extremity weakness  HPI:  51 yo man with relapsing-remitting Multiple Sclerosis diagnosed in 2010, on long-term steroids and Copaxone and followed by Dr. Sandria Manly with neurology who came into [facility] ED for the second time in the past 24 hours c/o progressive weakness in his lower extremities and fevers. His wife is at the bedside and reports that they cannot take care of him at home in this condition and that he cannot even walk to the bathroom with being carried and EMS had to be called to bring him to hospital. He reports no chest pain, congestion, abdominal pain, dysuria or other signs of infection except for fever and chills. No sick contacts. No Ha or neck stiffness. No recent weight loss. He complains of chronic back pain and pain in his legs, wife concerned that his legs "feel cold".   Review of Systems:  Review of Systems  Constitutional: Positive for fever, chills and malaise/fatigue. Negative for weight loss and diaphoresis.  HENT: Negative for congestion, sore throat and neck pain.   Eyes: Negative for blurred vision, double vision and pain.  Respiratory: Negative for cough, hemoptysis, sputum production and shortness of breath.   Cardiovascular: Positive for leg swelling. Negative for chest pain, orthopnea, claudication and PND.  Gastrointestinal: Positive for constipation. Negative for heartburn, nausea, vomiting and abdominal pain.  Genitourinary: Negative for dysuria, urgency and frequency.  Musculoskeletal: Positive for myalgias.  Skin: Negative for itching and rash.  Neurological: Positive for focal weakness and weakness. Negative for dizziness, tingling, sensory change, seizures, loss of consciousness and headaches.  Endo/Heme/Allergies: Negative.   Psychiatric/Behavioral: Positive for  depression and substance abuse. Negative for suicidal ideas, hallucinations and memory loss. The patient is nervous/anxious. The patient does not have insomnia.      Past Medical History  Diagnosis Date  . Multiple sclerosis    History reviewed. No pertinent past surgical history. Social History:  reports that he has been smoking Cigarettes.  He has been smoking about .5 packs per day. He does not have any smokeless tobacco history on file. He reports that he drinks alcohol. He reports that he uses illicit drugs (Cocaine and Marijuana). Previously was an Journalist, newspaper, 3 children, wife at bedside is an amputee and also has multiple health problems, very limited functional status at home, has been able to ambulate with a walker until about three days ago.  No Known Allergies  Family History  Problem Relation Age of Onset  . Heart failure Mother   . Heart failure Father   . Cancer Sister   . Seizures Brother     Prior to Admission medications   Medication Sig Start Date End Date Taking? Authorizing Provider  carbamazepine (TEGRETOL) 200 MG tablet Take 200 mg by mouth 3 (three) times daily.   Yes Historical Provider, MD  Cholecalciferol (VITAMIN D) 2000 UNITS tablet Take 2,000 Units by mouth every morning.   Yes Historical Provider, MD  gabapentin (NEURONTIN) 300 MG capsule Take 300 mg by mouth 3 (three) times daily.   Yes Historical Provider, MD  glatiramer (COPAXONE) 20 MG/ML injection Inject 20 mg into the skin every morning.   Yes Historical Provider, MD  HYDROcodone-acetaminophen (VICODIN ES) 7.5-750 MG per tablet Take 1 tablet by mouth every 6 (six) hours as needed. For pain   Yes Historical Provider, MD  losartan (COZAAR) 100  MG tablet Take 100 mg by mouth every morning.    Yes Historical Provider, MD  ondansetron (ZOFRAN) 4 MG tablet Take 1 tablet (4 mg total) by mouth every 6 (six) hours. 05/07/12 05/14/12 Yes Terry S. Colon Branch, MD   Physical Exam: Filed Vitals:   05/07/12 1730  05/07/12 1743 05/07/12 1946 05/07/12 2001  BP: 167/91  101/54 118/65  Pulse: 102  98 87  Temp:  103.5 F (39.7 C)  98.7 F (37.1 C)  TempSrc:  Rectal  Oral  Resp:   20   Height:      Weight:      SpO2: 96%  95% 96%     General:  NAD, awake, alert, slightly pressured speech/affect, cooperative with exam  Eyes: PERRL  ENT: normal  Neck: supple, good ROM  Cardiovascular: Tachycardic, regular, no mrg  Respiratory: CTAB  Abdomen: Soft. Non-tender no HSM  Skin: no rashes or lesions  Musculoskeletal: full use of upper extremities, 0/5 strength in LE, cannot wiggle his feet or toes for me or hold against gravity, sensation is intact, right leg is slightly more swollen that left, trace pitting edema in both, palpable DP pulses in both feet, normal cap refill although cool to touch  Psychiatric: slightly agitated but not in distress, difficulty focusing  Neurologic: see above MSK. LE abnormalities, hypo reflexive LE, UE all normal. CN normal.  Labs on Admission:  Basic Metabolic Panel:  Lab 05/07/12 1610 05/07/12 0542  NA 135 138  K 4.1 3.9  CL 101 102  CO2 24 25  GLUCOSE 117* 114*  BUN 13 14  CREATININE 1.04 1.08  CALCIUM 9.2 9.6  MG -- --  PHOS -- --   Liver Function Tests:  Lab 05/07/12 0542  AST 18  ALT 37  ALKPHOS 69  BILITOT 0.2*  PROT 6.9  ALBUMIN 3.8   No results found for this basename: LIPASE:5,AMYLASE:5 in the last 168 hours No results found for this basename: AMMONIA:5 in the last 168 hours CBC:  Lab 05/07/12 1800 05/07/12 0542  WBC 11.3* 12.6*  NEUTROABS 9.7* 10.3*  HGB 13.5 13.4  HCT 39.6 39.1  MCV 93.4 93.1  PLT 159 180    Radiological Exams on Admission: Dg Chest Port 1 View  05/07/2012  *RADIOLOGY REPORT*  Clinical Data: Fever.  Infiltrate.  PORTABLE CHEST - 1 VIEW  Comparison: Multiple priors most recently 05/17/2012 at 1535 hours. Chest CT 01/13/2007.  Findings: Left costophrenic angle excluded from view.  Left basilar subsegmental  atelectasis.  Prominent right pericardial fat pad. Cardiopericardial silhouette is within normal limits.  No airspace disease.  No effusion.  Calcified granuloma in the right upper lobe and punctate calcifications of the mediastinal lymph nodes again noted compatible with old granulomatous disease.  IMPRESSION: No acute cardiopulmonary disease.  Subsegmental left basilar atelectasis.  Original Report Authenticated By: Andreas Newport, M.D.   Dg Chest Port 1 View  05/07/2012  *RADIOLOGY REPORT*  Clinical Data: Weakness.  Fall.  Multiple sclerosis.  Smoker.  PORTABLE CHEST - 1 VIEW  Comparison: 06/14/2011 chest radiograph.  CT chest 01/13/2007  Findings: The normal heart size and pulmonary vascularity. Calcified granulomas in the mid lungs.  Nodular hilar opacities consistent with prominent lymph nodes and granulomas as seen on previous chest CT.  Mild emphysematous changes in the upper lungs. No focal airspace consolidation.  No blunting of costophrenic angles.  No pneumothorax.  Old left rib fracture.  IMPRESSION: Calcified granulomas with calcified hilar lymph nodes. Emphysematous changes.  No  evidence of active pulmonary disease.  Original Report Authenticated By: Marlon Pel, M.D.    EKG: Independently reviewed.   Assessment/Plan Active Problems:  Multiple sclerosis exacerbation  Fever  HTN (hypertension)  Tobacco abuse  Chronic pain  Polysubstance abuse  1. Multiple Sclerosis Exacerbation, in setting of fever and systemic illness  On Copaxone and Prednisone at home  Solu-Medrol 1000mg  q24hours for flare  May need MRI T-spine, L-Spine to r/o transerve myelitis/spinal cord pathology  Neurology Consultation  2. Fever, etiology unknown currently  CXR normal  UA normal  No clinical symptoms indication infection, may be viral illness  Some mild concern for possible DVT in his lower extremities, will check d-dimer and if elevated check LE dopplers  Blood Cultures pending  3.  Substance Abuse  UDS positive for Cocaine, certainly use in last 24 hours could cause fever and worsening of MS symptoms  SW consult for drug abuse  4. Chronic Pain  Increased his home dose of hydrocodone to 10/325  Increased Neurontin, probably significant amount of neuropathic pain, on relatively low dose PTA  Code Status: Full Code Family Communication: Discussed plan of care with patient and his wife Disposition Plan: TBD, PT/OT evaluation, may need rehab depending on his response to high dose steroids  Time spent: 70 minutes  [facility] Pager [phone]  If 7PM-7AM, please contact night-coverage [email or web address] Password [email or web address] 05/07/2012, 8:11 PM

## 2012-05-07 NOTE — ED Provider Notes (Addendum)
History     CSN: [id number]  Arrival date & time 05/07/12  0219   First MD Initiated Contact with Patient 05/07/12 0425      Chief Complaint  Patient presents with  . Fall    (Consider location/radiation/quality/duration/timing/severity/associated sxs/prior treatment) HPI Chad Vasquez is a 51 y.o. male with a h/o MS brought in by ambulance, who presents to the Emergency Department complaining of a fall and weakness. Patient states he was trying to get out of bed and his legs would not support him as he slid to the floor. He states he has had a fever for several days. Recently MS flair with evaluation by neurologist, Dr. Sandria Manly. copaxone and prednisone with improvement in his symptoms ( originally  was not able to walk due to bilat LE's weakness , then was able to walk with a walker while taking the meds). Pt states he stopped the prednisone approx 3 days ago with slow return and worsening of his bilat LE's weakness. States fever earlier tonight was 101 at home for which he took aspirin at 2200. Denies nausea, vomiting, chest pain, shortness of breath.  PCP Dr. Phillips Odor Neurologist Dr. Sandria Manly  Past Medical History  Diagnosis Date  . Multiple sclerosis     History reviewed. No pertinent past surgical history.  No family history on file.  History  Substance Use Topics  . Smoking status: Current Everyday Smoker -- 0.5 packs/day    Types: Cigarettes  . Smokeless tobacco: Not on file  . Alcohol Use: Yes     occ      Review of Systems  Constitutional: Positive for fever. Negative for chills.       10 Systems reviewed and are negative for acute change except as noted in the HPI.  HENT: Negative for congestion.   Eyes: Negative for discharge and redness.  Respiratory: Negative for cough and shortness of breath.   Cardiovascular: Negative for chest pain.  Gastrointestinal: Negative for vomiting and abdominal pain.  Musculoskeletal: Negative for back pain.  Skin: Negative for  rash.  Neurological: Positive for weakness. Negative for syncope, numbness and headaches.  Psychiatric/Behavioral:       No behavior change.    Allergies  Review of patient's allergies indicates no known allergies.  Home Medications   Current Outpatient Rx  Name Route Sig Dispense Refill  . CARBAMAZEPINE 200 MG PO TABS Oral Take 200 mg by mouth 3 (three) times daily.    Marland Kitchen GABAPENTIN 300 MG PO CAPS Oral Take 300 mg by mouth 3 (three) times daily.    Marland Kitchen HYDROCODONE-ACETAMINOPHEN 7.5-750 MG PO TABS Oral Take 1 tablet by mouth every 6 (six) hours.    Marland Kitchen LOSARTAN POTASSIUM 100 MG PO TABS Oral Take 100 mg by mouth daily.      BP 94/73  Pulse 97  Temp 99.9 F (37.7 C) (Oral)  Resp 20  Ht 6\' 4"  (1.93 m)  Wt 255 lb (115.667 kg)  BMI 31.04 kg/m2  SpO2 93%  Physical Exam  Nursing note and vitals reviewed. Constitutional: He is oriented to person, place, and time. He appears well-developed and well-nourished.       Awake, alert, nontoxic appearance.  HENT:  Head: Atraumatic.  Eyes: Right eye exhibits no discharge. Left eye exhibits no discharge.  Neck: Neck supple.  Cardiovascular: Normal heart sounds.   Pulmonary/Chest: Effort normal and breath sounds normal. He exhibits no tenderness.  Abdominal: Soft. Bowel sounds are normal. There is no tenderness. There is no rebound.  Musculoskeletal: Normal range of motion. He exhibits no tenderness.       Baseline ROM, no obvious new focal weakness.Able to stand at the bedside with assistance. Unable to ambulate on his own.  Neurological: He is alert and oriented to person, place, and time.       Mental status and motor strength appears baseline for patient and situation.  Skin: Skin is warm and dry. No rash noted.  Psychiatric: He has a normal mood and affect.    ED Course  Procedures (including critical care time)  Results for orders placed during the hospital encounter of 05/07/12  CBC WITH DIFFERENTIAL      Component Value Range    WBC 12.6 (*) 4.0 - 10.5 K/uL   RBC 4.20 (*) 4.22 - 5.81 MIL/uL   Hemoglobin 13.4  13.0 - 17.0 g/dL   HCT 04.5  40.9 - 81.1 %   MCV 93.1  78.0 - 100.0 fL   MCH 31.9  26.0 - 34.0 pg   MCHC 34.3  30.0 - 36.0 g/dL   RDW 91.4  78.2 - 95.6 %   Platelets 180  150 - 400 K/uL   Neutrophils Relative 82 (*) 43 - 77 %   Neutro Abs 10.3 (*) 1.7 - 7.7 K/uL   Lymphocytes Relative 9 (*) 12 - 46 %   Lymphs Abs 1.1  0.7 - 4.0 K/uL   Monocytes Relative 8  3 - 12 %   Monocytes Absolute 1.0  0.1 - 1.0 K/uL   Eosinophils Relative 2  0 - 5 %   Eosinophils Absolute 0.2  0.0 - 0.7 K/uL   Basophils Relative 0  0 - 1 %   Basophils Absolute 0.0  0.0 - 0.1 K/uL  COMPREHENSIVE METABOLIC PANEL      Component Value Range   Sodium 138  135 - 145 mEq/L   Potassium 3.9  3.5 - 5.1 mEq/L   Chloride 102  96 - 112 mEq/L   CO2 25  19 - 32 mEq/L   Glucose, Bld 114 (*) 70 - 99 mg/dL   BUN 14  6 - 23 mg/dL   Creatinine, Ser 2.13  0.50 - 1.35 mg/dL   Calcium 9.6  8.4 - 08.6 mg/dL   Total Protein 6.9  6.0 - 8.3 g/dL   Albumin 3.8  3.5 - 5.2 g/dL   AST 18  0 - 37 U/L   ALT 37  0 - 53 U/L   Alkaline Phosphatase 69  39 - 117 U/L   Total Bilirubin 0.2 (*) 0.3 - 1.2 mg/dL   GFR calc non Af Amer 77 (*) >90 mL/min   GFR calc Af Amer 89 (*) >90 mL/min   Dg Chest Port 1 View  05/07/2012  *RADIOLOGY REPORT*  Clinical Data: Weakness.  Fall.  Multiple sclerosis.  Smoker.  PORTABLE CHEST - 1 VIEW  Comparison: 06/14/2011 chest radiograph.  CT chest 01/13/2007  Findings: The normal heart size and pulmonary vascularity. Calcified granulomas in the mid lungs.  Nodular hilar opacities consistent with prominent lymph nodes and granulomas as seen on previous chest CT.  Mild emphysematous changes in the upper lungs. No focal airspace consolidation.  No blunting of costophrenic angles.  No pneumothorax.  Old left rib fracture.  IMPRESSION: Calcified granulomas with calcified hilar lymph nodes. Emphysematous changes.  No evidence of active  pulmonary disease.  Original Report Authenticated By: Marlon Pel, M.D.     MDM  Patient with MS here s/p fall and  increased weakness. Given IVF, PO fluids, analgesics and antiemetic. Labs unremarkable except for slightly elevated wbc. Chest xray without acute changes. Patient states he feels better. Dx testing d/w pt.   Questions answered.  Verb understanding, agreeable to d/c home with outpt f/u with Dr. Sandria Manly on Monday. Pt feels improved after observation and/or treatment in ED.Pt stable in ED with no significant deterioration in condition.The patient appears reasonably screened and/or stabilized for discharge and I doubt any other medical condition or other [facility] requiring further screening, evaluation, or treatment in the ED at this time prior to discharge.  MDM Reviewed: nursing note and vitals Interpretation: labs and x-ray            Nicoletta Dress. Colon Branch, MD 05/07/12 2313  Nicoletta Dress. Colon Branch, MD 05/07/12 [phone]

## 2012-05-07 NOTE — ED Notes (Signed)
Report given to floor. Pt ready for transport. VSS. NAD noted at present.

## 2012-05-07 NOTE — ED Notes (Signed)
Patient states was sitting on edge of bed and was trying to pull himself up in bed and teetered off the edge onto his knees.  Patient c/o weakness and was unable to pick himself off floor.  C/o pain from hips down.  Patient states was diagnosed with multiple sclerosis in 2010.

## 2012-05-07 NOTE — ED Notes (Signed)
Pt states that he has lost use of both legs since stopping steroids 2 days ago. Pt states that he can not pick up legs. Pt has pain bilateral hips describes has "sharp pin sticks" Pt slipped off the side of bed this am and he was unable to pull himself off of the floor

## 2012-05-07 NOTE — ED Notes (Signed)
Pt c/o fever, weakness in his legs and headache, onset last Thurs. Pt discharged from this facility this am.

## 2012-05-07 NOTE — ED Provider Notes (Signed)
History     CSN: [id number]  Arrival date & time 05/07/12  1657   First MD Initiated Contact with Patient 05/07/12 1729      Chief Complaint  Patient presents with  . Fever    HPI Pt was seen at 1730.  Per pt, c/o gradual onset and persistence of constant fever since yesterday.  Has been associated with generalized weakness/fatigue.  Denies cough/SOB, no CP/palpitations, no abd pain, no N/V/D, no rash, no sore throat.  Pt also states his bilat LE's have become progressively "weaker" over the past 3 days, since stopping the prednisone as rx by his Neuro MD for recent MS "flair."  States he has been unable to walk due to the weakness in his legs.  Denies saddle anesthesia, no back pain, no incont/retention of bowel or bladder.  States he was eval in the ED last night for same and discharged, but "doesn't feel any better."    Neuro:  Dr. Sandria Manly Past Medical History  Diagnosis Date  . Multiple sclerosis     History reviewed. No pertinent past surgical history.  Family History  Problem Relation Age of Onset  . Heart failure Mother   . Heart failure Father   . Cancer Sister   . Seizures Brother     History  Substance Use Topics  . Smoking status: Current Everyday Smoker -- 0.5 packs/day    Types: Cigarettes  . Smokeless tobacco: Not on file  . Alcohol Use: Yes     occ      Review of Systems ROS: Statement: All systems negative except as marked or noted in the HPI; Constitutional: +fever and chills. ; ; Eyes: Negative for eye pain, redness and discharge. ; ; ENMT: Negative for ear pain, hoarseness, nasal congestion, sinus pressure and sore throat. ; ; Cardiovascular: Negative for chest pain, palpitations, diaphoresis, dyspnea and peripheral edema. ; ; Respiratory: Negative for cough, wheezing and stridor. ; ; Gastrointestinal: Negative for nausea, vomiting, diarrhea, abdominal pain, blood in stool, hematemesis, jaundice and rectal bleeding. . ; ; Genitourinary: Negative for  dysuria, flank pain and hematuria. ; ; Musculoskeletal: Negative for back pain and neck pain. Negative for swelling and trauma.; ; Skin: Negative for pruritus, rash, abrasions, blisters, bruising and skin lesion.; ; Neuro: +weakness.  Negative for headache, lightheadedness and neck stiffness. Negative for altered level of consciousness , altered mental status, extremity weakness, paresthesias, involuntary movement, seizure and syncope.     Allergies  Review of patient's allergies indicates no known allergies.  Home Medications   Current Outpatient Rx  Name Route Sig Dispense Refill  . CARBAMAZEPINE 200 MG PO TABS Oral Take 200 mg by mouth 3 (three) times daily.    Marland Kitchen VITAMIN D 2000 UNITS PO TABS Oral Take 2,000 Units by mouth every morning.    Marland Kitchen GABAPENTIN 300 MG PO CAPS Oral Take 300 mg by mouth 3 (three) times daily.    Marland Kitchen GLATIRAMER ACETATE 20 MG/ML [place] KIT Subcutaneous Inject 20 mg into the skin every morning.    Marland Kitchen HYDROCODONE-ACETAMINOPHEN 7.5-750 MG PO TABS Oral Take 1 tablet by mouth every 6 (six) hours as needed. For pain    . LOSARTAN POTASSIUM 100 MG PO TABS Oral Take 100 mg by mouth every morning.     Marland Kitchen ONDANSETRON HCL 4 MG PO TABS Oral Take 1 tablet (4 mg total) by mouth every 6 (six) hours. 12 tablet 0    BP 167/91  Pulse 102  Temp 103.5 F (39.7 C) (  Rectal)  Resp 20  Ht 6\' 4"  (1.93 m)  Wt 255 lb (115.667 kg)  BMI 31.04 kg/m2  SpO2 96%  Physical Exam 1735: Physical examination:  Nursing notes reviewed; Vital signs and O2 SAT reviewed;  Constitutional: Well developed, Well nourished, In no acute distress; Head:  Normocephalic, atraumatic; Eyes: EOMI, PERRL, No scleral icterus; ENMT: Mouth and pharynx normal, Mucous membranes dry; Neck: Supple, Full range of motion, No lymphadenopathy; Cardiovascular: Regular rate and rhythm, No gallop; Respiratory: Breath sounds clear & equal bilaterally, No wheezes.  Speaking full sentences with ease, Normal respiratory effort/excursion;  Chest: Nontender, Movement normal; Abdomen: Soft, Nontender, Nondistended, Normal bowel sounds;; Extremities: Pulses normal, No tenderness, No edema, No calf edema or asymmetry.; Neuro: AA&Ox3, Major CN grossly intact.  Speech clear. +1/5 weakness bilat LE's, otherwise no gross focal motor deficits in extremities.; Skin: Color normal, Warm, Dry, no rash.   ED Course  Procedures   1740:  Pt states he "doesn't know" if he has had a fever since discharge from the ED this morning.  Has not taken his temp, nor taken tylenol or motrin.  LD ASA "yesterday."  States he is "just too weak" to care for himself at home.  Family concurs, stating that he "can't move around on his own" (ie: getting in and out of bed) because he is "too weak with fever."  Denies any specific complaint of cough, sore throat, rash, N/V/D, CP/SOB, or back pain.  Likely viral illness to account for fever, though pt is on immune modulator medication (copaxone).  Bilat LE's weakness actually started last month and is NOT new today.  Pt describes his symptoms last month as a "MS flair."  He states he was eval by his Neuro MD Love, rx copaxone and prednisone with improvement in his symptoms (ie:  was not able to walk due to bilat LE's weakness initially, then was able to walk with a walker while taking the meds).  Pt states he stopped the prednisone approx 3 days ago with slow return and worsening of his bilat LE's weakness.  Appears likely MS flair at this time.  Long d/w multiple family members at bedside regarding above timeline.  Still continue to state they "won't take him home like this" and are requesting admission.    1815:  T/C to [facility] Dr. Karilyn Cota, case discussed, including:  HPI, pertinent PM/SHx, VS/PE, dx testing, ED course and treatment:  Agreeable to admit, requests to write temporary orders, obtain medical bed.   MDM  MDM Reviewed: previous chart, nursing note and vitals Reviewed previous: labs and x-ray Interpretation: labs  and x-ray   Results for orders placed during the hospital encounter of 05/07/12  CBC WITH DIFFERENTIAL      Component Value Range   WBC 11.3 (*) 4.0 - 10.5 K/uL   RBC 4.24  4.22 - 5.81 MIL/uL   Hemoglobin 13.5  13.0 - 17.0 g/dL   HCT 40.9  81.1 - 91.4 %   MCV 93.4  78.0 - 100.0 fL   MCH 31.8  26.0 - 34.0 pg   MCHC 34.1  30.0 - 36.0 g/dL   RDW 78.2  95.6 - 21.3 %   Platelets 159  150 - 400 K/uL   Neutrophils Relative PENDING  43 - 77 %   Neutro Abs PENDING  1.7 - 7.7 K/uL   Band Neutrophils PENDING  0 - 10 %   Lymphocytes Relative PENDING  12 - 46 %   Lymphs Abs PENDING  0.7 -  4.0 K/uL   Monocytes Relative PENDING  3 - 12 %   Monocytes Absolute PENDING  0.1 - 1.0 K/uL   Eosinophils Relative PENDING  0 - 5 %   Eosinophils Absolute PENDING  0.0 - 0.7 K/uL   Basophils Relative PENDING  0 - 1 %   Basophils Absolute PENDING  0.0 - 0.1 K/uL   WBC Morphology PENDING     RBC Morphology PENDING     Smear Review PENDING     nRBC PENDING  0 /100 WBC   Metamyelocytes Relative PENDING     Myelocytes PENDING     Promyelocytes Absolute PENDING     Blasts PENDING    BASIC METABOLIC PANEL      Component Value Range   Sodium 135  135 - 145 mEq/L   Potassium 4.1  3.5 - 5.1 mEq/L   Chloride 101  96 - 112 mEq/L   CO2 24  19 - 32 mEq/L   Glucose, Bld 117 (*) 70 - 99 mg/dL   BUN 13  6 - 23 mg/dL   Creatinine, Ser 1.61  0.50 - 1.35 mg/dL   Calcium 9.2  8.4 - 09.6 mg/dL   GFR calc non Af Amer 80 (*) >90 mL/min   GFR calc Af Amer >90  >90 mL/min  LACTIC ACID, PLASMA      Component Value Range   Lactic Acid, Venous 1.4  0.5 - 2.2 mmol/L  URINALYSIS, ROUTINE W REFLEX MICROSCOPIC      Component Value Range   Color, Urine YELLOW  YELLOW   APPearance CLEAR  CLEAR   Specific Gravity, Urine 1.020  1.005 - 1.030   pH 6.5  5.0 - 8.0   Glucose, UA NEGATIVE  NEGATIVE mg/dL   Hgb urine dipstick NEGATIVE  NEGATIVE   Bilirubin Urine NEGATIVE  NEGATIVE   Ketones, ur NEGATIVE  NEGATIVE mg/dL    Protein, ur NEGATIVE  NEGATIVE mg/dL   Urobilinogen, UA 0.2  0.0 - 1.0 mg/dL   Nitrite NEGATIVE  NEGATIVE   Leukocytes, UA NEGATIVE  NEGATIVE  URINE RAPID DRUG SCREEN (HOSP PERFORMED)      Component Value Range   Opiates POSITIVE (*) NONE DETECTED   Cocaine POSITIVE (*) NONE DETECTED   Benzodiazepines NONE DETECTED  NONE DETECTED   Amphetamines NONE DETECTED  NONE DETECTED   Tetrahydrocannabinol NONE DETECTED  NONE DETECTED   Barbiturates NONE DETECTED  NONE DETECTED   Dg Chest Port 1 View 05/07/2012  *RADIOLOGY REPORT*  Clinical Data: Fever.  Infiltrate.  PORTABLE CHEST - 1 VIEW  Comparison: Multiple priors most recently 05/17/2012 at 1535 hours. Chest CT 01/13/2007.  Findings: Left costophrenic angle excluded from view.  Left basilar subsegmental atelectasis.  Prominent right pericardial fat pad. Cardiopericardial silhouette is within normal limits.  No airspace disease.  No effusion.  Calcified granuloma in the right upper lobe and punctate calcifications of the mediastinal lymph nodes again noted compatible with old granulomatous disease.  IMPRESSION: No acute cardiopulmonary disease.  Subsegmental left basilar atelectasis.  Original Report Authenticated By: Andreas Newport, M.D.   Dg Chest Port 1 View 05/07/2012  *RADIOLOGY REPORT*  Clinical Data: Weakness.  Fall.  Multiple sclerosis.  Smoker.  PORTABLE CHEST - 1 VIEW  Comparison: 06/14/2011 chest radiograph.  CT chest 01/13/2007  Findings: The normal heart size and pulmonary vascularity. Calcified granulomas in the mid lungs.  Nodular hilar opacities consistent with prominent lymph nodes and granulomas as seen on previous chest CT.  Mild emphysematous changes in  the upper lungs. No focal airspace consolidation.  No blunting of costophrenic angles.  No pneumothorax.  Old left rib fracture.  IMPRESSION: Calcified granulomas with calcified hilar lymph nodes. Emphysematous changes.  No evidence of active pulmonary disease.  Original Report  Authenticated By: Marlon Pel, M.D.             Laray Anger, DO 05/08/12 1454

## 2012-05-08 ENCOUNTER — Inpatient Hospital Stay (HOSPITAL_COMMUNITY): Payer: Medicare Other

## 2012-05-08 DIAGNOSIS — G35 Multiple sclerosis: Secondary | ICD-10-CM

## 2012-05-08 DIAGNOSIS — F1910 Other psychoactive substance abuse, uncomplicated: Secondary | ICD-10-CM

## 2012-05-08 DIAGNOSIS — M79609 Pain in unspecified limb: Secondary | ICD-10-CM

## 2012-05-08 MED ORDER — GLATIRAMER ACETATE 20 MG/ML ~~LOC~~ KIT
20.0000 mg | PACK | Freq: Every day | SUBCUTANEOUS | Status: DC
  Administered 2012-05-09 – 2012-05-20 (×9): 20 mg via SUBCUTANEOUS
  Filled 2012-05-08 (×12): qty 1

## 2012-05-08 MED ORDER — GADOBENATE DIMEGLUMINE 529 MG/ML IV SOLN
20.0000 mL | Freq: Once | INTRAVENOUS | Status: AC | PRN
  Administered 2012-05-08: 20 mL via INTRAVENOUS

## 2012-05-08 NOTE — Consult Note (Signed)
Reason for Consult: Referring Physician:   ZYMEIR SALMINEN is an 51 y.o. male.  HPI:   Past Medical History  Diagnosis Date  . Multiple sclerosis     History reviewed. No pertinent past surgical history.  Family History  Problem Relation Age of Onset  . Heart failure Mother   . Stroke Mother   . Heart failure Father   . Cancer Sister   . Seizures Brother     Social History:  reports that he has been smoking Cigarettes.  He has been smoking about .5 packs per day. He does not have any smokeless tobacco history on file. He reports that he drinks alcohol. He reports that he uses illicit drugs (Cocaine and Marijuana).  Allergies: No Known Allergies  Medications:  Prior to Admission medications   Medication Sig Start Date End Date Taking? Authorizing Provider  carbamazepine (TEGRETOL) 200 MG tablet Take 200 mg by mouth 3 (three) times daily.   Yes Historical Provider, MD  Cholecalciferol (VITAMIN D) 2000 UNITS tablet Take 2,000 Units by mouth every morning.   Yes Historical Provider, MD  gabapentin (NEURONTIN) 300 MG capsule Take 300 mg by mouth 3 (three) times daily.   Yes Historical Provider, MD  glatiramer (COPAXONE) 20 MG/ML injection Inject 20 mg into the skin every morning.   Yes Historical Provider, MD  HYDROcodone-acetaminophen (VICODIN ES) 7.5-750 MG per tablet Take 1 tablet by mouth every 6 (six) hours as needed. For pain   Yes Historical Provider, MD  losartan (COZAAR) 100 MG tablet Take 100 mg by mouth every morning.    Yes Historical Provider, MD  ondansetron (ZOFRAN) 4 MG tablet Take 1 tablet (4 mg total) by mouth every 6 (six) hours. 05/07/12 05/14/12 Yes Terry S. Colon Branch, MD    Scheduled Meds:   . sodium chloride   Intravenous STAT  . acetaminophen  1,000 mg Oral Once  . aspirin EC  81 mg Oral Daily  . carbamazepine  200 mg Oral TID  . cholecalciferol  2,000 Units Oral BH-q7a  . docusate sodium  100 mg Oral BID  . enoxaparin (LOVENOX) injection  40 mg Subcutaneous  Q24H  . gabapentin  600 mg Oral TID  . glatiramer  20 mg Subcutaneous Daily  . ibuprofen  400 mg Oral Once  . methylPREDNISolone (SOLU-MEDROL) injection  1,000 mg Intravenous Q24H  . sodium chloride  3 mL Intravenous Q12H  . DISCONTD: gabapentin  300 mg Oral TID  . DISCONTD: glatiramer  20 mg Subcutaneous BH-q7a   Continuous Infusions:   . sodium chloride 100 mL/hr at 05/08/12 1643  . DISCONTD: sodium chloride     PRN Meds:.acetaminophen, acetaminophen, alum & mag hydroxide-simeth, gadobenate dimeglumine, HYDROcodone-acetaminophen, ondansetron (ZOFRAN) IV, ondansetron (ZOFRAN) IV, ondansetron, polyethylene glycol, zolpidem   Results for orders placed during the hospital encounter of 05/07/12 (from the past 48 hour(s))  CULTURE, BLOOD (ROUTINE X 2)     Status: Normal (Preliminary result)   Collection Time   05/07/12  5:45 PM      Component Value Range Comment   Specimen Description BLOOD LEFT ARM      Special Requests BOTTLES DRAWN AEROBIC AND ANAEROBIC 4CC      Culture NO GROWTH 1 DAY      Report Status PENDING     D-DIMER, QUANTITATIVE     Status: Abnormal   Collection Time   05/07/12  5:46 PM      Component Value Range Comment   D-Dimer, Quant 0.58 (*) 0.00 -  0.48 ug/mL-FEU   URINALYSIS, ROUTINE W REFLEX MICROSCOPIC     Status: Normal   Collection Time   05/07/12  5:55 PM      Component Value Range Comment   Color, Urine YELLOW  YELLOW    APPearance CLEAR  CLEAR    Specific Gravity, Urine 1.020  1.005 - 1.030    pH 6.5  5.0 - 8.0    Glucose, UA NEGATIVE  NEGATIVE mg/dL    Hgb urine dipstick NEGATIVE  NEGATIVE    Bilirubin Urine NEGATIVE  NEGATIVE    Ketones, ur NEGATIVE  NEGATIVE mg/dL    Protein, ur NEGATIVE  NEGATIVE mg/dL    Urobilinogen, UA 0.2  0.0 - 1.0 mg/dL    Nitrite NEGATIVE  NEGATIVE    Leukocytes, UA NEGATIVE  NEGATIVE MICROSCOPIC NOT DONE ON URINES WITH NEGATIVE PROTEIN, BLOOD, LEUKOCYTES, NITRITE, OR GLUCOSE <1000 mg/dL.  URINE RAPID DRUG SCREEN (HOSP  PERFORMED)     Status: Abnormal   Collection Time   05/07/12  5:55 PM      Component Value Range Comment   Opiates POSITIVE (*) NONE DETECTED    Cocaine POSITIVE (*) NONE DETECTED    Benzodiazepines NONE DETECTED  NONE DETECTED    Amphetamines NONE DETECTED  NONE DETECTED    Tetrahydrocannabinol NONE DETECTED  NONE DETECTED    Barbiturates NONE DETECTED  NONE DETECTED   CBC WITH DIFFERENTIAL     Status: Abnormal   Collection Time   05/07/12  6:00 PM      Component Value Range Comment   WBC 11.3 (*) 4.0 - 10.5 K/uL    RBC 4.24  4.22 - 5.81 MIL/uL    Hemoglobin 13.5  13.0 - 17.0 g/dL    HCT 16.1  09.6 - 04.5 %    MCV 93.4  78.0 - 100.0 fL    MCH 31.8  26.0 - 34.0 pg    MCHC 34.1  30.0 - 36.0 g/dL    RDW 40.9  81.1 - 91.4 %    Platelets 159  150 - 400 K/uL    Neutrophils Relative 86 (*) 43 - 77 %    Lymphocytes Relative 8 (*) 12 - 46 %    Monocytes Relative 6  3 - 12 %    Eosinophils Relative 0  0 - 5 %    Basophils Relative 0  0 - 1 %    Band Neutrophils 0  0 - 10 %    Metamyelocytes Relative 0      Myelocytes 0      Promyelocytes Absolute 0      Blasts 0      nRBC 0  0 /100 WBC    Neutro Abs 9.7 (*) 1.7 - 7.7 K/uL    Lymphs Abs 0.9  0.7 - 4.0 K/uL    Monocytes Absolute 0.7  0.1 - 1.0 K/uL    Eosinophils Absolute 0.0  0.0 - 0.7 K/uL    Basophils Absolute 0.0  0.0 - 0.1 K/uL   BASIC METABOLIC PANEL     Status: Abnormal   Collection Time   05/07/12  6:00 PM      Component Value Range Comment   Sodium 135  135 - 145 mEq/L    Potassium 4.1  3.5 - 5.1 mEq/L    Chloride 101  96 - 112 mEq/L    CO2 24  19 - 32 mEq/L    Glucose, Bld 117 (*) 70 - 99 mg/dL    BUN  13  6 - 23 mg/dL    Creatinine, Ser 1.61  0.50 - 1.35 mg/dL    Calcium 9.2  8.4 - 09.6 mg/dL    GFR calc non Af Amer 80 (*) >90 mL/min    GFR calc Af Amer >90  >90 mL/min   LACTIC ACID, PLASMA     Status: Normal   Collection Time   05/07/12  6:00 PM      Component Value Range Comment   Lactic Acid, Venous 1.4  0.5 - 2.2  mmol/L   PROCALCITONIN     Status: Normal   Collection Time   05/07/12  6:00 PM      Component Value Range Comment   Procalcitonin 0.35     CULTURE, BLOOD (ROUTINE X 2)     Status: Normal (Preliminary result)   Collection Time   05/07/12  6:00 PM      Component Value Range Comment   Specimen Description BLOOD RIGHT ARM      Special Requests BOTTLES DRAWN AEROBIC AND ANAEROBIC 6CC      Culture NO GROWTH 1 DAY      Report Status PENDING       Mr Thoracic Spine W Wo Contrast  05/08/2012  *RADIOLOGY REPORT*  Clinical Data: Lower extremity weakness.  Fever.  Multiple sclerosis.  MRI THORACIC SPINE WITHOUT AND WITH CONTRAST  Technique:  Multiplanar and multiecho pulse sequences of the thoracic spine were obtained without and with intravenous contrast.  Contrast: 20mL MULTIHANCE GADOBENATE DIMEGLUMINE 529 MG/ML IV SOLN  Comparison: Cervical MRI dated 09/15/2010  Findings: The patient has a subtle plaque in the left posterior lateral aspect of the thoracic spinal cord at T2, unchanged since the cervical MRI dated 09/15/2010.  There are also small focal plaques in the posterior columns at T10 and centrally in the spinal cord at T10-11.  The spinal cord is not enlarged.  There is no pathologic enhancement of the spinal cord or plaques after contrast administration.  The osseous structures of the thoracic spine are normal.  Discs are normal.  No foraminal or spinal stenosis.  The paraspinal soft tissues are normal.  IMPRESSION:  1.  Plaques in the thoracic spinal cord at T2, T10, and T11, most consistent with multiple sclerosis. 2.  No evidence of transverse myelitis or spinal abscess.  Original Report Authenticated By: Gwynn Burly, M.D.   Mr Lumbar Spine W Wo Contrast  05/08/2012  *RADIOLOGY REPORT*  Clinical Data: Lower extremity weakness.  Fever.  Multiple sclerosis.  MRI LUMBAR SPINE WITHOUT AND WITH CONTRAST  Technique:  Multiplanar and multiecho pulse sequences of the lumbar spine were obtained without  and with intravenous contrast.  Contrast: 20mL MULTIHANCE GADOBENATE DIMEGLUMINE 529 MG/ML IV SOLN  Comparison: MRI dated 02/25/2012  Findings: Scan extends from T11-12 through S2.  Tip of the conus is at L1-2 and appears normal.  T11-12:  Normal.  T12-L1:  Small disc bulge central and slightly to the right without neural impingement without change.  L1-2:  Small broad-based disc bulge with no neural impingement, unchanged.  L2-3:  Far lateral disc bulges to the right and left without neural impingement and without change.  L3-4:  Far lateral annular tears of the right and left without neural impingement, unchanged.  Moderate bilateral facet arthritis. Slight narrowing of the spinal canal, unchanged.  L4-5:  Moderate bilateral facet hypertrophy.  Small annular tear to the left.  No neural impingement.  L5 S1:  5 mm spondylolisthesis with slight bulging  of the uncovered disc with slight narrowing of the neural foramina, right greater than left, without focal neural impingement, unchanged.  IMPRESSION:   Multilevel degenerative disc disease without focal neural impingement.  No change since the prior exam.  No significant enhancement after contrast administration.  Original Report Authenticated By: Gwynn Burly, M.D.   US Venous Img Lower Bilateral  05/08/2012  *RADIOLOGY REPORT*  Clinical Data: Bilateral leg weakness and hip pain, history of smoking, evaluate for DVT  BILATERAL LOWER EXTREMITY VENOUS DUPLEX ULTRASOUND  Technique:  Gray-scale sonography with graded compression, as well as color Doppler and duplex ultrasound, were performed to evaluate the deep venous system of both lower extremities from the level of the common femoral vein through the popliteal and proximal calf veins.  Spectral Doppler was utilized to evaluate flow at rest and with distal augmentation maneuvers.  Comparison:  None.  Findings:  Normal compressibility of bilateral common femoral, superficial femoral, and popliteal veins is  demonstrated, as well as the visualized proximal calf veins.  No filling defects to suggest DVT on grayscale or color Doppler imaging.  Doppler waveforms show normal direction of venous flow, normal respiratory phasicity and response to augmentation.  IMPRESSION: No evidence of deep vein thrombosis, within either lower extremity.  Original Report Authenticated By: Waynard Reeds, M.D.   Dg Chest Port 1 View  05/07/2012  *RADIOLOGY REPORT*  Clinical Data: Fever.  Infiltrate.  PORTABLE CHEST - 1 VIEW  Comparison: Multiple priors most recently 05/17/2012 at 1535 hours. Chest CT 01/13/2007.  Findings: Left costophrenic angle excluded from view.  Left basilar subsegmental atelectasis.  Prominent right pericardial fat pad. Cardiopericardial silhouette is within normal limits.  No airspace disease.  No effusion.  Calcified granuloma in the right upper lobe and punctate calcifications of the mediastinal lymph nodes again noted compatible with old granulomatous disease.  IMPRESSION: No acute cardiopulmonary disease.  Subsegmental left basilar atelectasis.  Original Report Authenticated By: Andreas Newport, M.D.   Dg Chest Port 1 View  05/07/2012  *RADIOLOGY REPORT*  Clinical Data: Weakness.  Fall.  Multiple sclerosis.  Smoker.  PORTABLE CHEST - 1 VIEW  Comparison: 06/14/2011 chest radiograph.  CT chest 01/13/2007  Findings: The normal heart size and pulmonary vascularity. Calcified granulomas in the mid lungs.  Nodular hilar opacities consistent with prominent lymph nodes and granulomas as seen on previous chest CT.  Mild emphysematous changes in the upper lungs. No focal airspace consolidation.  No blunting of costophrenic angles.  No pneumothorax.  Old left rib fracture.  IMPRESSION: Calcified granulomas with calcified hilar lymph nodes. Emphysematous changes.  No evidence of active pulmonary disease.  Original Report Authenticated By: Marlon Pel, M.D.    Review of Systems  Constitutional: Positive for  fever.  Eyes: Negative.   Respiratory: Negative.   Cardiovascular: Negative.   Gastrointestinal: Negative for nausea, vomiting and abdominal pain.  Genitourinary: Negative.   Musculoskeletal: Negative.   Skin: Negative.   Neurological: Positive for headaches.  Endo/Heme/Allergies: Negative.   Psychiatric/Behavioral: Positive for substance abuse.   Blood pressure 138/79, pulse 84, temperature 98.5 F (36.9 C), temperature source Oral, resp. rate 20, height 6\' 4"  (1.93 m), weight 116.075 kg (255 lb 14.4 oz), SpO2 96.00%. Physical Exam  Assessment/Plan: See dict  Alejandra Barna 05/08/2012, 5:38 PM

## 2012-05-08 NOTE — Clinical Social Work Psychosocial (Signed)
Clinical Social Work Department BRIEF PSYCHOSOCIAL ASSESSMENT 05/08/2012  Patient:  Chad Vasquez, Chad Vasquez     Account Number:  [id number]     Admit date:  05/07/2012  Clinical Social Worker:  Santa Genera, CLINICAL SOCIAL WORKER  Date/Time:  05/08/2012 12:00 N  Referred by:  Physician  Date Referred:  05/08/2012 Referred for  Substance Abuse   Other Referral:   Interview type:  Patient Other interview type:    PSYCHOSOCIAL DATA Living Status:  WITH DISABLED ADULT Admitted from facility:   Level of care:   Primary support name:  Flossie Buffy Primary support relationship to patient:  FRIEND Degree of support available:   Friend lives in house w patient and helps w cleaning, transportation, care of animals.  Friend is BKA amputee after fall while trimming tree branches resulted in serious leg fracture which became infected.  Friend wears prosthetic leg.    CURRENT CONCERNS Current Concerns  Substance Abuse   Other Concerns:   Adjustment to MS  Living situation    SOCIAL WORK ASSESSMENT / PLAN CSW met w patient at bedside and attempted to administer SBIRT.  Patient admitted that he had used cocaine and marijuana 4 days ago when a "friend came over and I wanted to try it."  Denies any current alcohol use, says he "used to party" and drink heavily prior to his MS diagnosis. States that he "left off all that stuff" after being diagnosed.  Says he quit drinking alcohol gradually and rarely drinks - estimates several times/year in social settings.  Based on patient's responses, SBIRT score is 5, primarily based on fact that patient states health care provider has told him not to drink or use unprescribed substances recently.  Patient states that his friend uses alcohol and drugs "occasionally."  States that his substance using friends "don't come around any more" because he no longer uses.  Says that since he has stopped working, he cannot afford to purchase drugs or alcohol, so this  has limited his use as well.    Patient's major concerns revolve around adjustment to illness.  States  "they say I can get better, but Im not so sure - the damage has already been done."  Patient feels that he will not regain use of his legs because MS has damaged them, so PT would be fruitless.  Wonders if he will have to enter ALF or some other living arrangement as he will no longer be able to care for self.  Says he has weighed cost of ALF or renting apartment, but can live more cheaply at his current residence (a double wide w a ramp). Has several animals which mean a lot to him, and knows he cannot take these to another living situation.  Has prepped house for handicapped needs w extra railings and modifications to bath/shower area.  Friend lives in spare bedroom at house and assists w care as needed.    Assisted patient w information need by talking w PT regarding potential for rehab of walking.  PT clarified that he might be able to regain strength w PT, patient realizes he may be overdoing exercise.  He attends an outpatient PT rehab clinic and works out on bicycle.    Patient states that he has been somewhat depressed about his prognosis lately, which has made him less willing to participate in exercise - "I give up."  W additional information from PT, patient was encouraged to develop realistic plan for physical rehab, including beginning w in  home PT.  Patient also has access to resources through [facility] nurse care line for additional resources if needed.  Encouarged patient to discuss depression w MD.   Assessment/plan status:  Psychosocial Support/Ongoing Assessment of Needs Other assessment/ plan:   Information/referral to community resources:   Will provide list of ALFs if patient requests.    PATIENT'S/FAMILY'S RESPONSE TO PLAN OF CARE: Patient seems more encouraged about his prognosis and willing to engage in PT rehab to regain strength. Encouraged patient to talk to MD about  depression, as well as using positive thinking, physical exercise and reaching out to social supports for help when needed.  Ron Parker Clinical Social Worker [phone])

## 2012-05-08 NOTE — Consult Note (Signed)
NAMEDEQUAN, KINDRED               ACCOUNT NO.:  [id number]  MEDICAL RECORD NO.:  [id number]  LOCATION:  A303                          FACILITY:  [facility]  PHYSICIAN:  Alam Guterrez A. Gerilyn Pilgrim, M.D. DATE OF BIRTH:  1961/06/19  DATE OF CONSULTATION: DATE OF DISCHARGE:                                CONSULTATION   HISTORY OF PRESENT ILLNESS:  The patient is a 51 year old man who has a known history of multiple sclerosis.  The patient was actually worked up here at [facility].  Three years ago, had a spinal tap which was positive.  For EMS panel, he has had a high IgG index, IgG synthesis rate, and a possibility of color band C.  He is being followed by Dr. Sandria Manly in [place].  The patient apparently has been able to ambulate and does fairly well; however about two months ago, he had an MRI of the leg because of numbness and weakness of the leg.  The patient reports that at that time, he was having what appears to be episodic severe contraction spasm of the right thigh, which impaired his ability to walk.  It happened abruptly, which only lasted for about a minute or so. Intensity was quite severe however.  The patient did have a lumbar and thoracic spine MRI ordered by his primary care provider, which I did review.  It thus shows multilevel foraminal and disk disease, but this is rather moderate and really does not explain his symptomatology.  The patient continued to have symptoms and apparently got worse to a point where he is not able to ambulate about 3 weeks ago.  Dr. Sandria Manly did trace the patient on high-dose Solu-Medrol, which was actually quite helpful, but he still had difficulties ambulating, in fact he has not ambulated without assistive devices over the last month.  The patient was in bed when he developed fever and the patient was actually sleeping.  He woke up and complained that he was not feeling well.  He was hot and had chills, temperature apparently recorded at 102.  Concurrently,  the patient reports that his leg weakness got abruptly worse.  He was not able to move it at all.  The patient was taken to the emergency room where he had evaluation and temperature improved.  This resulted in his leg weakness actually improving and returned back to normal.  He was discharged home, but unfortunately several hours, he had these similar symptoms again where he had high spiking fever associated with paralysis of the legs.  At this time, he was admitted to the hospital for further evaluation.  He had another MRI of the lumbar and thoracic spine, which essentially is unchanged from the previous scan.  The patient has remained afebrile since admitted.  He does not complain of chest pains or shortness of breath, apparently he has had some swelling of the legs. He does report having sort of a midline sagittal headache over the last couple of weeks.  He does report improvement in the leg weakness since his temperature has been down.  PHYSICAL EXAMINATION:  GENERAL:  Shows a pleasant, moderately overweight man in no acute distress. HEENT:  Head is normocephalic, atraumatic.  NECK:  Supple. ABDOMEN:  Somewhat obese, but soft. EXTREMITIES:  Mild edema of the legs. NEUROLOGIC:  Mentation:  He is awake and alert.  Speech, language, and cognition are intact.  Cranial nerve evaluation shows the pupils are 3.5 to 4 mm and reactive.  Visual fields are intact.  Extraocular movements are full.  I see no nystagmus.  Facial muscle strength is symmetric. Tongue is midline.  Uvula midline.  Shoulder shrug is normal.  Motor examination actually shows normal straight upper extremities.  There is no pronator drift.  Bulk and tone are also unremarkable.  Legs show profound weakness.  The patient can barely lift both legs proximally against gravity for about an inch with much prompting and much effort. Dorsiflexion on the right is 4, left is 1.  Reflexes shows evidence of some hyperreflexia.  He  is clearly brisk in the legs.  Plantars are equivocal on the right, downgoing on the left.  He does have somewhat brachial reflex in the upper extremities, especially of the brachioradialis where he has finger flexors.  Coordination, there is no dysmetria, no tremors.  IMPRESSION:  The patient seemed to have dramatically worsening symptoms with a fever, which is a well known phenomenon with multiple sclerosis patient.  The issue however is why he is having fever.  So far, he has had an extensive workup including blood cultures, urinalysis, and chest x-ray, and everything has essentially been negative.  The patient does have a history of polysubstance drug abuse and urinalysis has been positive for pain and opioids.  RECOMMENDATIONS:  Given the new complaints of headaches, we will go ahead and do an MRI of the brain with and without contrast.  We will also do the MRI of the cervical spine.  Additional blood testing will be done.  This will include the vitamin B12 level and also sed rate, may also consider ANA.  Agree with the steroids, which have been started.  I recommend doing this for 5 days given the profound weakness.  We will continue to follow the patient.  Thanks for this consultation.     Derk Doubek A. Gerilyn Pilgrim, M.D.     KAD/MEDQ  D:  05/08/2012  T:  05/08/2012  Job:  [id number]

## 2012-05-08 NOTE — Progress Notes (Signed)
Subjective: This man, who has long-standing multiple sclerosis, was admitted with fever and weakness in both his legs. He also has a history of polysubstance drug abuse. Since yesterday, his fever has resolved for the time being. His legs also feel better and his strength is improving.           Physical Exam: Blood pressure 121/71, pulse 71, temperature 98.1 F (36.7 C), temperature source Oral, resp. rate 20, height 6\' 4"  (1.93 m), weight 116.075 kg (255 lb 14.4 oz), SpO2 97.00%. He looks systemically well. He does not appear to be toxic or septic. Heart sounds are present and normal. Lung fields are clear. Abdomen is soft and nontender. Both his legs are clearly reduced and power, 1/5 bilaterally. There is no clonus. There does not appear to be any back pain on bilateral hip flexion.   Investigations:  Recent Results (from the past 240 hour(s))  CULTURE, BLOOD (ROUTINE X 2)     Status: Normal (Preliminary result)   Collection Time   05/07/12  5:45 PM      Component Value Range Status Comment   Specimen Description BLOOD LEFT ARM   Final    Special Requests BOTTLES DRAWN AEROBIC AND ANAEROBIC 4CC   Final    Culture PENDING   Incomplete    Report Status PENDING   Incomplete   CULTURE, BLOOD (ROUTINE X 2)     Status: Normal (Preliminary result)   Collection Time   05/07/12  6:00 PM      Component Value Range Status Comment   Specimen Description BLOOD RIGHT ARM   Final    Special Requests BOTTLES DRAWN AEROBIC AND ANAEROBIC [facility]   Final    Culture PENDING   Incomplete    Report Status PENDING   Incomplete      Basic Metabolic Panel:  Basename 05/07/12 1800 05/07/12 0542  NA 135 138  K 4.1 3.9  CL 101 102  CO2 24 25  GLUCOSE 117* 114*  BUN 13 14  CREATININE 1.04 1.08  CALCIUM 9.2 9.6  MG -- --  PHOS -- --   Liver Function Tests:  [facility] 05/07/12 0542  AST 18  ALT 37  ALKPHOS 69  BILITOT 0.2*  PROT 6.9  ALBUMIN 3.8     CBC:  Basename 05/07/12 1800  05/07/12 0542  WBC 11.3* 12.6*  NEUTROABS 9.7* 10.3*  HGB 13.5 13.4  HCT 39.6 39.1  MCV 93.4 93.1  PLT 159 180    Dg Chest Port 1 View  05/07/2012  *RADIOLOGY REPORT*  Clinical Data: Fever.  Infiltrate.  PORTABLE CHEST - 1 VIEW  Comparison: Multiple priors most recently 05/17/2012 at 1535 hours. Chest CT 01/13/2007.  Findings: Left costophrenic angle excluded from view.  Left basilar subsegmental atelectasis.  Prominent right pericardial fat pad. Cardiopericardial silhouette is within normal limits.  No airspace disease.  No effusion.  Calcified granuloma in the right upper lobe and punctate calcifications of the mediastinal lymph nodes again noted compatible with old granulomatous disease.  IMPRESSION: No acute cardiopulmonary disease.  Subsegmental left basilar atelectasis.  Original Report Authenticated By: Andreas Newport, M.D.   Dg Chest Port 1 View  05/07/2012  *RADIOLOGY REPORT*  Clinical Data: Weakness.  Fall.  Multiple sclerosis.  Smoker.  PORTABLE CHEST - 1 VIEW  Comparison: 06/14/2011 chest radiograph.  CT chest 01/13/2007  Findings: The normal heart size and pulmonary vascularity. Calcified granulomas in the mid lungs.  Nodular hilar opacities consistent with prominent lymph nodes  and granulomas as seen on previous chest CT.  Mild emphysematous changes in the upper lungs. No focal airspace consolidation.  No blunting of costophrenic angles.  No pneumothorax.  Old left rib fracture.  IMPRESSION: Calcified granulomas with calcified hilar lymph nodes. Emphysematous changes.  No evidence of active pulmonary disease.  Original Report Authenticated By: Marlon Pel, M.D.      Medications: I have reviewed the patient's current medications.  Impression: 1. Multiple sclerosis exacerbation. 2. Fever, unclear etiology. 3. Hypertension. 4. Polysubstance drug abuse, positive for cocaine and marijuana. 5. Tobacco abuse.     Plan: 1. MRI of the lumbar and thoracic spine to make sure  there is no evidence of abscess or any other pathology causing fever and weakness in his legs. 2. Await neurology consultation.     LOS: 1 day   Wilson Singer Pager [phone]  05/08/2012, 7:48 AM

## 2012-05-08 NOTE — Progress Notes (Signed)
Utilization Review Complete  

## 2012-05-08 NOTE — Evaluation (Signed)
Physical Therapy Evaluation Patient Details Name: Chad Vasquez MRN: [id number] DOB: 10-Jan-1961 Today's Date: 05/08/2012 Time: 4098-1191 PT Time Calculation (min): 53 min  PT Assessment / Plan / Recommendation Clinical Impression  Pt seen for recent exacerbation of LE weakness.  He describes having had a high fever with sudden onset of inability to move his legs.  Today, although he has significant weakness, especially in the LLE, he feels that he is back to his baseline.  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    PT Assessment  Patient needs continued PT services    Follow Up Recommendations  Home health PT    Barriers to Discharge None      Equipment Recommendations  Defer to next venue    Recommendations for Other Services     Frequency Min 3X/week    Precautions / Restrictions Precautions Precautions: Fall Restrictions Weight Bearing Restrictions: No   Pertinent Vitals/Pain       Mobility  Bed Mobility Bed Mobility: Rolling Right;Rolling Left;Left Sidelying to Sit Rolling Right: 6: Modified independent (Device/Increase time) Rolling Left: 6: Modified independent (Device/Increase time) Left Sidelying to Sit: 6: Modified independent (Device/Increase time) Transfers Transfers: Sit to Stand;Stand to Sit Sit to Stand: 5: Supervision Stand to Sit: 5: Supervision Ambulation/Gait Ambulation/Gait Assistance: 5: Supervision Ambulation Distance (Feet): 25 Feet (25' x 2, 15' x 1) Assistive device: Rolling walker Gait Pattern: Step-through pattern;Decreased dorsiflexion - left Stairs: No Wheelchair Mobility Wheelchair Mobility: No    Exercises General Exercises - Lower Extremity Short Arc Quad: AAROM;10 reps;Both;Supine Heel Slides: AAROM;Both;10 reps;Supine Hip ABduction/ADduction: AAROM;Both;10 reps;Supine   PT Diagnosis: Difficulty walking;Generalized weakness  PT Problem List: Decreased  strength;Decreased activity tolerance;Decreased mobility PT Treatment Interventions: Gait training;Therapeutic activities;Therapeutic exercise;Patient/family education   PT Goals Acute Rehab PT Goals PT Goal Formulation: With patient Time For Goal Achievement: 05/08/12 Potential to Achieve Goals: Good Pt will Ambulate: 16 - 50 feet;with modified independence;with rolling walker PT Goal: Ambulate - Progress: Goal set today  Visit Information  Last PT Received On: 05/08/12    Subjective Data  Subjective: I couldn't even stand up yesterday Patient Stated Goal: able to do all ADLs independently   Prior Functioning  Home Living Lives With: Significant other Available Help at Discharge: Family Type of Home: House Home Access: Level entry Home Layout: One level Bathroom Shower/Tub: Tub/shower unit Home Adaptive Equipment: Walker - rolling;Bedside commode/3-in-1;Wheelchair - manual Prior Function Level of Independence: Independent with assistive device(s) Able to Take Stairs?: No Driving: Yes Vocation: On disability Comments: has had MS since 2010 Communication Communication: No difficulties    Cognition  Overall Cognitive Status: Appears within functional limits for tasks assessed/performed Arousal/Alertness: Awake/alert Orientation Level: Appears intact for tasks assessed Behavior During Session: [facility] for tasks performed    Extremity/Trunk Assessment Right Upper Extremity Assessment RUE ROM/Strength/Tone: Within functional levels Left Upper Extremity Assessment LUE ROM/Strength/Tone: Within functional levels Right Lower Extremity Assessment RLE ROM/Strength/Tone: Deficits RLE ROM/Strength/Tone Deficits: hip strength=2+/5, knee and ankle = 4/5 RLE Sensation: WFL - Light Touch Left Lower Extremity Assessment LLE ROM/Strength/Tone: Deficits LLE ROM/Strength/Tone Deficits: general strength = 2/5 LLE Sensation: WFL - Light Touch Trunk Assessment Trunk Assessment: Normal     Balance Balance Balance Assessed: Yes Dynamic Sitting Balance Dynamic Sitting - Balance Support: No upper extremity supported Dynamic Sitting - Level of Assistance: 7: Independent  End of Session PT - End of Session Equipment Utilized During Treatment:  Gait belt Activity Tolerance: Patient tolerated treatment well Patient left: in chair;with call bell/phone within reach Nurse Communication: Mobility status  GP     Konrad Penta 05/08/2012, 12:01 PM

## 2012-05-08 NOTE — Care Management Note (Signed)
    Page 1 of 1   05/15/2012     2:56:12 PM   CARE MANAGEMENT NOTE 05/15/2012  Patient:  Chad Vasquez, Chad Vasquez   Account Number:  [id number]  Date Initiated:  05/08/2012  Documentation initiated by:  Rosemary Holms  Subjective/Objective Assessment:   Pt admitted with exaserbation of MS and bilateral leg paralysis. High fever. Pt lives at home with spouse. Concerned over the cause of fever.     Action/Plan:   Spoke to pt at bedside. Discussed PT's recommendation of HHPT which he is agreeable to. Has used [facility] RN in the past and would like [facility] PT at DC.   Anticipated DC Date:     Anticipated DC Plan:  ACUTE TO ACUTE TRANS      DC Planning Services  CM consult      Choice offered to / List presented to:          [facility] arranged  HH-2 PT      [facility] agency  [facility]   Status of service:  Completed, signed off Medicare Important Message given?   (If response is "NO", the following Medicare IM given date fields will be blank) Date Medicare IM given:   Date Additional Medicare IM given:    Discharge Disposition:  ACUTE TO ACUTE TRANS  Per UR Regulation:    If discussed at Long Length of Stay Meetings, dates discussed:    Comments:  05/15/12 Pt transferred to [facility] over weekend  05/10/12 Rosemary Holms RN BSN CM  Continue to monitor pt's hospital course.  05/08/12 1400 Zebulen Simonis Leanord Hawking RN BSN CM

## 2012-05-09 ENCOUNTER — Inpatient Hospital Stay (HOSPITAL_COMMUNITY): Payer: Medicare Other

## 2012-05-09 LAB — URINE CULTURE

## 2012-05-09 LAB — VITAMIN B12: Vitamin B-12: 264 pg/mL (ref 211–911)

## 2012-05-09 MED ORDER — GADOBENATE DIMEGLUMINE 529 MG/ML IV SOLN
20.0000 mL | Freq: Once | INTRAVENOUS | Status: AC | PRN
  Administered 2012-05-09: 20 mL via INTRAVENOUS

## 2012-05-09 NOTE — Progress Notes (Signed)
Notified Dr. Phillips Odor of patient's increased temperature.  Patient alert and able to respond to questions.  No new orders received at this time.  Will continue to monitor patient.

## 2012-05-09 NOTE — Progress Notes (Signed)
Subjective: Interval History:   Objective: Vital signs in last 24 hours: Temp:  [98.2 F (36.8 C)-99 F (37.2 C)] 98.2 F (36.8 C) (08/06 0505) Pulse Rate:  [81-84] 83  (08/06 0505) Resp:  [19-20] 19  (08/06 0505) BP: (127-138)/(78-82) 135/82 mmHg (08/06 0505) SpO2:  [95 %-97 %] 95 % (08/06 0505)  Intake/Output from previous day: 08/05 0701 - 08/06 0700 In: 3640 [I.V.:3640] Out: 1300 [Urine:1300] Intake/Output this shift:   Nutritional status: General    Lab Results:  Basename 05/07/12 1800 05/07/12 0542  WBC 11.3* 12.6*  HGB 13.5 13.4  HCT 39.6 39.1  PLT 159 180  NA 135 138  K 4.1 3.9  CL 101 102  CO2 24 25  GLUCOSE 117* 114*  BUN 13 14  CREATININE 1.04 1.08  CALCIUM 9.2 9.6  LABA1C -- --   Lipid Panel No results found for this basename: CHOL,TRIG,HDL,CHOLHDL,VLDL,LDLCALC in the last 72 hours  Studies/Results: Mr Thoracic Spine W Wo Contrast  05/08/2012  *RADIOLOGY REPORT*  Clinical Data: Lower extremity weakness.  Fever.  Multiple sclerosis.  MRI THORACIC SPINE WITHOUT AND WITH CONTRAST  Technique:  Multiplanar and multiecho pulse sequences of the thoracic spine were obtained without and with intravenous contrast.  Contrast: 20mL MULTIHANCE GADOBENATE DIMEGLUMINE 529 MG/ML IV SOLN  Comparison: Cervical MRI dated 09/15/2010  Findings: The patient has a subtle plaque in the left posterior lateral aspect of the thoracic spinal cord at T2, unchanged since the cervical MRI dated 09/15/2010.  There are also small focal plaques in the posterior columns at T10 and centrally in the spinal cord at T10-11.  The spinal cord is not enlarged.  There is no pathologic enhancement of the spinal cord or plaques after contrast administration.  The osseous structures of the thoracic spine are normal.  Discs are normal.  No foraminal or spinal stenosis.  The paraspinal soft tissues are normal.  IMPRESSION:  1.  Plaques in the thoracic spinal cord at T2, T10, and T11, most consistent with  multiple sclerosis. 2.  No evidence of transverse myelitis or spinal abscess.  Original Report Authenticated By: Gwynn Burly, M.D.   Mr Lumbar Spine W Wo Contrast  05/08/2012  *RADIOLOGY REPORT*  Clinical Data: Lower extremity weakness.  Fever.  Multiple sclerosis.  MRI LUMBAR SPINE WITHOUT AND WITH CONTRAST  Technique:  Multiplanar and multiecho pulse sequences of the lumbar spine were obtained without and with intravenous contrast.  Contrast: 20mL MULTIHANCE GADOBENATE DIMEGLUMINE 529 MG/ML IV SOLN  Comparison: MRI dated 02/25/2012  Findings: Scan extends from T11-12 through S2.  Tip of the conus is at L1-2 and appears normal.  T11-12:  Normal.  T12-L1:  Small disc bulge central and slightly to the right without neural impingement without change.  L1-2:  Small broad-based disc bulge with no neural impingement, unchanged.  L2-3:  Far lateral disc bulges to the right and left without neural impingement and without change.  L3-4:  Far lateral annular tears of the right and left without neural impingement, unchanged.  Moderate bilateral facet arthritis. Slight narrowing of the spinal canal, unchanged.  L4-5:  Moderate bilateral facet hypertrophy.  Small annular tear to the left.  No neural impingement.  L5 S1:  5 mm spondylolisthesis with slight bulging of the uncovered disc with slight narrowing of the neural foramina, right greater than left, without focal neural impingement, unchanged.  IMPRESSION:   Multilevel degenerative disc disease without focal neural impingement.  No change since the prior exam.  No significant  enhancement after contrast administration.  Original Report Authenticated By: Gwynn Burly, M.D.   US Venous Img Lower Bilateral  05/08/2012  *RADIOLOGY REPORT*  Clinical Data: Bilateral leg weakness and hip pain, history of smoking, evaluate for DVT  BILATERAL LOWER EXTREMITY VENOUS DUPLEX ULTRASOUND  Technique:  Gray-scale sonography with graded compression, as well as color Doppler and  duplex ultrasound, were performed to evaluate the deep venous system of both lower extremities from the level of the common femoral vein through the popliteal and proximal calf veins.  Spectral Doppler was utilized to evaluate flow at rest and with distal augmentation maneuvers.  Comparison:  None.  Findings:  Normal compressibility of bilateral common femoral, superficial femoral, and popliteal veins is demonstrated, as well as the visualized proximal calf veins.  No filling defects to suggest DVT on grayscale or color Doppler imaging.  Doppler waveforms show normal direction of venous flow, normal respiratory phasicity and response to augmentation.  IMPRESSION: No evidence of deep vein thrombosis, within either lower extremity.  Original Report Authenticated By: Waynard Reeds, M.D.   Dg Chest Port 1 View  05/07/2012  *RADIOLOGY REPORT*  Clinical Data: Fever.  Infiltrate.  PORTABLE CHEST - 1 VIEW  Comparison: Multiple priors most recently 05/17/2012 at 1535 hours. Chest CT 01/13/2007.  Findings: Left costophrenic angle excluded from view.  Left basilar subsegmental atelectasis.  Prominent right pericardial fat pad. Cardiopericardial silhouette is within normal limits.  No airspace disease.  No effusion.  Calcified granuloma in the right upper lobe and punctate calcifications of the mediastinal lymph nodes again noted compatible with old granulomatous disease.  IMPRESSION: No acute cardiopulmonary disease.  Subsegmental left basilar atelectasis.  Original Report Authenticated By: Andreas Newport, M.D.    Medications: \ Scheduled Meds:   . sodium chloride   Intravenous STAT  . aspirin EC  81 mg Oral Daily  . carbamazepine  200 mg Oral TID  . cholecalciferol  2,000 Units Oral BH-q7a  . docusate sodium  100 mg Oral BID  . enoxaparin (LOVENOX) injection  40 mg Subcutaneous Q24H  . gabapentin  600 mg Oral TID  . glatiramer  20 mg Subcutaneous Daily  . methylPREDNISolone (SOLU-MEDROL) injection  1,000 mg  Intravenous Q24H  . sodium chloride  3 mL Intravenous Q12H  . DISCONTD: glatiramer  20 mg Subcutaneous BH-q7a   Continuous Infusions:   . sodium chloride 100 mL/hr at 05/09/12 0341   PRN Meds:.acetaminophen, acetaminophen, alum & mag hydroxide-simeth, gadobenate dimeglumine, HYDROcodone-acetaminophen, ondansetron (ZOFRAN) IV, ondansetron, polyethylene glycol, zolpidem  Assessment/Plan: See dict   LOS: 2 days   Anderson Coppock

## 2012-05-09 NOTE — Progress Notes (Signed)
NAMEJACKOB, Chad Vasquez               ACCOUNT NO.:  [id number]  MEDICAL RECORD NO.:  [id number]  LOCATION:  [facility]                         FACILITY:  [facility]  PHYSICIAN:  Leyton Magoon A. Gerilyn Pilgrim, M.D. DATE OF BIRTH:  [birth year]  DATE OF PROCEDURE: DATE OF DISCHARGE:  05/07/2012                                PROGRESS NOTE   The patient reports that he is feeling better.  His strength has improved.  No new complaints are reported.  He is sitting up in a chair today, watching television.  PHYSICAL EXAMINATION:  He is awake and alert.  Speech, language, and cognition are intact.  Cranial nerves 2-12 are fine.  Neck is supple. He has good strength in upper extremities.  Legs do show improved strength.  He has 4+ proximally on both sides.  Coordination shows no dysmetria or tremor.  Sed rate is 30.  All the labs are pending.  Head CT, MRI and cervical spine MRI also are pending.  ASSESSMENT AND PLAN:  Fever-induced quadriplegia, I suspect this is due to effects of temperature worsening his multiple sclerosis. The source of his fever; however, this is unknown and concerning.  We will follow up the MRI of the cervical spine and brain.  We also may need to entertain the possibility of doing a spinal tap. Fever work up has come back negative. So far he has been afebrile, off antibiotics.  Again, I will suggest that he will be placed on at least 3 days and as much as 4 days of high dose Solu-Medrol.     Edilia Ghuman A. Gerilyn Pilgrim, M.D.     KAD/MEDQ  D:  05/09/2012  T:  05/09/2012  Job:  [id number]

## 2012-05-09 NOTE — Progress Notes (Signed)
Subjective: This man, who has long-standing multiple sclerosis, was admitted with fever and weakness in both his legs. He also has a history of polysubstance drug abuse. He is now sitting up in a chair and he is clearly improving in terms of his strength. He was started on intravenous high dose steroids which I think are clearly helping. He had an MRI scan of his thoracic and lumbar spine, this shows the presence of plaques at T2, T10 and T11. There is no evidence of spinal abscess of transverse myelitis in the thoracic or lumbar spine.           Physical Exam: Blood pressure 135/82, pulse 83, temperature 98.2 F (36.8 C), temperature source Oral, resp. rate 19, height 6\' 4"  (1.93 m), weight 116.075 kg (255 lb 14.4 oz), SpO2 95.00%. He looks systemically well. He does not appear to be toxic or septic. Heart sounds are present and normal. Lung fields are clear. Abdomen is soft and nontender. Both his legs are clearly reduced and power, but improved, now 2/5 bilaterally. There is no clonus. There does not appear to be any back pain on bilateral hip flexion.   Investigations:  Recent Results (from the past 240 hour(s))  CULTURE, BLOOD (ROUTINE X 2)     Status: Normal (Preliminary result)   Collection Time   05/07/12  5:45 PM      Component Value Range Status Comment   Specimen Description BLOOD LEFT ARM   Final    Special Requests BOTTLES DRAWN AEROBIC AND ANAEROBIC 4CC   Final    Culture NO GROWTH 1 DAY   Final    Report Status PENDING   Incomplete   CULTURE, BLOOD (ROUTINE X 2)     Status: Normal (Preliminary result)   Collection Time   05/07/12  6:00 PM      Component Value Range Status Comment   Specimen Description BLOOD RIGHT ARM   Final    Special Requests BOTTLES DRAWN AEROBIC AND ANAEROBIC 6CC   Final    Culture NO GROWTH 1 DAY   Final    Report Status PENDING   Incomplete      Basic Metabolic Panel:  Basename 05/07/12 1800 05/07/12 0542  NA 135 138  K 4.1 3.9    CL 101 102  CO2 24 25  GLUCOSE 117* 114*  BUN 13 14  CREATININE 1.04 1.08  CALCIUM 9.2 9.6  MG -- --  PHOS -- --   Liver Function Tests:  [facility] 05/07/12 0542  AST 18  ALT 37  ALKPHOS 69  BILITOT 0.2*  PROT 6.9  ALBUMIN 3.8     CBC:  Basename 05/07/12 1800 05/07/12 0542  WBC 11.3* 12.6*  NEUTROABS 9.7* 10.3*  HGB 13.5 13.4  HCT 39.6 39.1  MCV 93.4 93.1  PLT 159 180    Mr Thoracic Spine W Wo Contrast  05/08/2012  *RADIOLOGY REPORT*  Clinical Data: Lower extremity weakness.  Fever.  Multiple sclerosis.  MRI THORACIC SPINE WITHOUT AND WITH CONTRAST  Technique:  Multiplanar and multiecho pulse sequences of the thoracic spine were obtained without and with intravenous contrast.  Contrast: 20mL MULTIHANCE GADOBENATE DIMEGLUMINE 529 MG/ML IV SOLN  Comparison: Cervical MRI dated 09/15/2010  Findings: The patient has a subtle plaque in the left posterior lateral aspect of the thoracic spinal cord at T2, unchanged since the cervical MRI dated 09/15/2010.  There are also small focal plaques in the posterior columns at T10 and centrally in the spinal  cord at T10-11.  The spinal cord is not enlarged.  There is no pathologic enhancement of the spinal cord or plaques after contrast administration.  The osseous structures of the thoracic spine are normal.  Discs are normal.  No foraminal or spinal stenosis.  The paraspinal soft tissues are normal.  IMPRESSION:  1.  Plaques in the thoracic spinal cord at T2, T10, and T11, most consistent with multiple sclerosis. 2.  No evidence of transverse myelitis or spinal abscess.  Original Report Authenticated By: Gwynn Burly, M.D.   Mr Lumbar Spine W Wo Contrast  05/08/2012  *RADIOLOGY REPORT*  Clinical Data: Lower extremity weakness.  Fever.  Multiple sclerosis.  MRI LUMBAR SPINE WITHOUT AND WITH CONTRAST  Technique:  Multiplanar and multiecho pulse sequences of the lumbar spine were obtained without and with intravenous contrast.  Contrast: 20mL  MULTIHANCE GADOBENATE DIMEGLUMINE 529 MG/ML IV SOLN  Comparison: MRI dated 02/25/2012  Findings: Scan extends from T11-12 through S2.  Tip of the conus is at L1-2 and appears normal.  T11-12:  Normal.  T12-L1:  Small disc bulge central and slightly to the right without neural impingement without change.  L1-2:  Small broad-based disc bulge with no neural impingement, unchanged.  L2-3:  Far lateral disc bulges to the right and left without neural impingement and without change.  L3-4:  Far lateral annular tears of the right and left without neural impingement, unchanged.  Moderate bilateral facet arthritis. Slight narrowing of the spinal canal, unchanged.  L4-5:  Moderate bilateral facet hypertrophy.  Small annular tear to the left.  No neural impingement.  L5 S1:  5 mm spondylolisthesis with slight bulging of the uncovered disc with slight narrowing of the neural foramina, right greater than left, without focal neural impingement, unchanged.  IMPRESSION:   Multilevel degenerative disc disease without focal neural impingement.  No change since the prior exam.  No significant enhancement after contrast administration.  Original Report Authenticated By: Gwynn Burly, M.D.   US Venous Img Lower Bilateral  05/08/2012  *RADIOLOGY REPORT*  Clinical Data: Bilateral leg weakness and hip pain, history of smoking, evaluate for DVT  BILATERAL LOWER EXTREMITY VENOUS DUPLEX ULTRASOUND  Technique:  Gray-scale sonography with graded compression, as well as color Doppler and duplex ultrasound, were performed to evaluate the deep venous system of both lower extremities from the level of the common femoral vein through the popliteal and proximal calf veins.  Spectral Doppler was utilized to evaluate flow at rest and with distal augmentation maneuvers.  Comparison:  None.  Findings:  Normal compressibility of bilateral common femoral, superficial femoral, and popliteal veins is demonstrated, as well as the visualized proximal  calf veins.  No filling defects to suggest DVT on grayscale or color Doppler imaging.  Doppler waveforms show normal direction of venous flow, normal respiratory phasicity and response to augmentation.  IMPRESSION: No evidence of deep vein thrombosis, within either lower extremity.  Original Report Authenticated By: Waynard Reeds, M.D.   Dg Chest Port 1 View  05/07/2012  *RADIOLOGY REPORT*  Clinical Data: Fever.  Infiltrate.  PORTABLE CHEST - 1 VIEW  Comparison: Multiple priors most recently 05/17/2012 at 1535 hours. Chest CT 01/13/2007.  Findings: Left costophrenic angle excluded from view.  Left basilar subsegmental atelectasis.  Prominent right pericardial fat pad. Cardiopericardial silhouette is within normal limits.  No airspace disease.  No effusion.  Calcified granuloma in the right upper lobe and punctate calcifications of the mediastinal lymph nodes again noted compatible  with old granulomatous disease.  IMPRESSION: No acute cardiopulmonary disease.  Subsegmental left basilar atelectasis.  Original Report Authenticated By: Andreas Newport, M.D.      Medications: I have reviewed the patient's current medications.  Impression: 1. Multiple sclerosis exacerbation, improving. 2. Fever, unclear etiology, appears to have resolved. 3. Hypertension. 4. Polysubstance drug abuse, positive for cocaine and marijuana. 5. Tobacco abuse.     Plan: 1. Await MRI of the brain and cervical spine. 2. Continue with intravenous high-dose steroids. Appreciate neurology consultation.     LOS: 2 days   Wilson Singer Pager [phone]  05/09/2012, 8:45 AM

## 2012-05-09 NOTE — Evaluation (Signed)
Occupational Therapy Evaluation Patient Details Name: Chad Vasquez MRN: [id number] DOB: 1960-10-31 Today's Date: 05/09/2012 Time: 1478-2956 OT Time Calculation (min): 16 min  OT Assessment / Plan / Recommendation Clinical Impression  Patient is a 51 y/o s/p MS Exacerbation presenting to acute OT with all education complete. Recommended patient purchase shower seat for use during showers. No further OT services needed at this time; will sign off.    OT Assessment  Patient does not need any further OT services    Follow Up Recommendations  No OT follow up       Equipment Recommendations  Tub/shower seat          Precautions / Restrictions Precautions Precautions: Fall   Pertinent Vitals/Pain No reports of pain.    ADL  Grooming: Modified independent;Simulated Where Assessed - Grooming: Unsupported sitting Upper Body Bathing: Set up;Simulated Where Assessed - Upper Body Bathing: Unsupported sitting Lower Body Bathing: Supervision/safety;Simulated Where Assessed - Lower Body Bathing: Unsupported sit to stand Upper Body Dressing: Simulated;Modified independent Where Assessed - Upper Body Dressing: Unsupported sitting Lower Body Dressing: Performed;Supervision/safety Where Assessed - Lower Body Dressing: Unsupported sit to stand Toilet Transfer: Performed Toilet Transfer Method: Sit to Barista:  (to bed then back to recliner) Toileting - Clothing Manipulation and Hygiene: Simulated;Supervision/safety Where Assessed - Toileting Clothing Manipulation and Hygiene: Standing Transfers/Ambulation Related to ADLs: Patient transfers at a Supervision level with RW.        Visit Information  Last OT Received On: 05/09/12 Assistance Needed: +1    Subjective Data  Subjective: "I'm doing ok." Patient Stated Goal: To go home.   Prior Functioning  Vision/Perception  Home Living Lives With: Friend(s) Available Help at Discharge: Other (Comment) (friend  is an amputee. Can not help much.) Type of Home: House Home Access: Ramped entrance Home Layout: One level Bathroom Shower/Tub: Tub/shower unit;Curtain Firefighter: Standard Bathroom Accessibility: Yes How Accessible: Accessible via walker Home Adaptive Equipment: Walker - four wheeled Prior Function Level of Independence: Independent with assistive device(s) Able to Take Stairs?: No Driving: Yes Vocation: On disability Communication Communication: No difficulties Dominant Hand: Left   Vision - Assessment Eye Alignment: Within Functional Limits  Cognition  Overall Cognitive Status: Appears within functional limits for tasks assessed/performed Arousal/Alertness: Awake/alert Orientation Level: Appears intact for tasks assessed Behavior During Session: [facility] for tasks performed    Extremity/Trunk Assessment Right Upper Extremity Assessment RUE ROM/Strength/Tone: Within functional levels (MMT: 5/5) Left Upper Extremity Assessment LUE ROM/Strength/Tone: Within functional levels (MMT: 5/5)   Mobility Transfers Transfers: Sit to Stand;Stand to Sit Sit to Stand: 5: Supervision;From chair/3-in-1;With upper extremity assist Stand to Sit: 5: Supervision;To chair/3-in-1;With upper extremity assist         End of Session OT - End of Session Activity Tolerance: Patient tolerated treatment well Patient left: in chair;with call bell/phone within reach    [facility], OTR/L 05/09/2012, 2:27 PM

## 2012-05-10 DIAGNOSIS — I10 Essential (primary) hypertension: Secondary | ICD-10-CM

## 2012-05-10 MED ORDER — IBUPROFEN 800 MG PO TABS
800.0000 mg | ORAL_TABLET | Freq: Once | ORAL | Status: AC
  Administered 2012-05-10: 800 mg via ORAL
  Filled 2012-05-10: qty 1

## 2012-05-10 NOTE — Progress Notes (Signed)
Subjective: Interval History:   Objective: Vital signs in last 24 hours: Temp:  [98.1 F (36.7 C)-102 F (38.9 C)] 98.1 F (36.7 C) (08/07 0558) Pulse Rate:  [74-101] 74  (08/07 0558) Resp:  [20] 20  (08/07 0558) BP: (130-179)/(76-81) 130/76 mmHg (08/07 0558) SpO2:  [93 %-98 %] 93 % (08/07 0558)  Intake/Output from previous day: 08/06 0701 - 08/07 0700 In: 2368.3 [I.V.:2368.3] Out: 3000 [Urine:3000] Intake/Output this shift:   Nutritional status: General    Lab Results:  Basename 05/07/12 1800  WBC 11.3*  HGB 13.5  HCT 39.6  PLT 159  NA 135  K 4.1  CL 101  CO2 24  GLUCOSE 117*  BUN 13  CREATININE 1.04  CALCIUM 9.2  LABA1C --   Lipid Panel No results found for this basename: CHOL,TRIG,HDL,CHOLHDL,VLDL,LDLCALC in the last 72 hours  Studies/Results: Mr Laqueta Jean Wo Contrast  05/09/2012  *RADIOLOGY REPORT*  Clinical Data:  50 year old male with lower extremity weakness. History of multiple sclerosis.  MRI HEAD WITHOUT AND WITH CONTRAST MRI CERVICAL SPINE WITHOUT AND WITH CONTRAST  Technique:  Multiplanar, multiecho pulse sequences of the brain and surrounding structures, and cervical spine, to include the craniocervical junction and cervicothoracic junction, were obtained without and with intravenous contrast.  Contrast: 20mL MULTIHANCE GADOBENATE DIMEGLUMINE 529 MG/ML IV SOLN  Comparison:  Thoracic spine study performed the previous day and reported separately.  Brain and cervical MRI 09/15/2010 and earlier.  MRI HEAD  Findings:  Normal cerebral volume. No restricted diffusion to suggest acute infarction.  No midline shift, mass effect, evidence of mass lesion, ventriculomegaly, extra-axial collection or acute intracranial hemorrhage.  Cervicomedullary junction and pituitary are within normal limits.  Major intracranial vascular flow voids are stable, dominant left vertebral artery.  Normal bone marrow signal.  No abnormal enhancement identified.  Chronically advanced T2 and  FLAIR hyperintense foci throughout the brain are stable since 2011. Cerebral hemisphere lesions are oriented perpendicular to the lateral ventricles as before, typical in appearance for multiple sclerosis.  There is involvement of the brain stem as before. Cervical spine findings are below.  Orbit soft tissues are within normal limits.  Stable minor paranasal sinus mucosal thickening.  Mastoids remain clear. Negative scalp soft tissues.  IMPRESSION: 1.  Stable MRI appearance of chronic demyelinating disease of the brain.  No enhancement or evidence of acute demyelination. 2.  Cervical spine findings are below. 3.  No new intracranial abnormality.  MRI CERVICAL SPINE  Findings: Stable cervical vertebral height and alignment with preserved lordosis. No marrow edema or evidence of acute osseous abnormality.  Visualized paraspinal soft tissues are within normal limits.  Chronic T2 and STIR hyperintense right lateral spinal cord plaque at C3 is re-identified and not significantly changed.  Elsewhere the cervical spinal cord signal is within normal limits.  Upper thoracic spinal cord plaque is partially visible on the left at T2 and is also chronic.  No abnormal spinal cord or intradural enhancement to suggest acute demyelination.  Stable chronic predominately mild cervical spine degenerative changes.  There is minimal to mild spinal stenosis at C6-C7 with moderate to severe bilateral foraminal stenosis again noted, related to disc bulge and ligament flavum hypertrophy.  There is a mild chronic disc bulge at C3-C4 with chronic uncovertebral hypertrophy and mild to moderate to bilateral foraminal stenosis.  IMPRESSION: 1.  Stable chronic demyelinating lesions in the spinal cord at C3 and T2.  No new spinal cord signal abnormality.  No enhancement to suggest acute demyelination. 2.  Stable mild for age cervical degenerative changes.  Original Report Authenticated By: Harley Hallmark, M.D.   Mr Cervical Spine W Wo  Contrast  05/09/2012  *RADIOLOGY REPORT*  Clinical Data:  51 year old male with lower extremity weakness. History of multiple sclerosis.  MRI HEAD WITHOUT AND WITH CONTRAST MRI CERVICAL SPINE WITHOUT AND WITH CONTRAST  Technique:  Multiplanar, multiecho pulse sequences of the brain and surrounding structures, and cervical spine, to include the craniocervical junction and cervicothoracic junction, were obtained without and with intravenous contrast.  Contrast: 20mL MULTIHANCE GADOBENATE DIMEGLUMINE 529 MG/ML IV SOLN  Comparison:  Thoracic spine study performed the previous day and reported separately.  Brain and cervical MRI 09/15/2010 and earlier.  MRI HEAD  Findings:  Normal cerebral volume. No restricted diffusion to suggest acute infarction.  No midline shift, mass effect, evidence of mass lesion, ventriculomegaly, extra-axial collection or acute intracranial hemorrhage.  Cervicomedullary junction and pituitary are within normal limits.  Major intracranial vascular flow voids are stable, dominant left vertebral artery.  Normal bone marrow signal.  No abnormal enhancement identified.  Chronically advanced T2 and FLAIR hyperintense foci throughout the brain are stable since 2011. Cerebral hemisphere lesions are oriented perpendicular to the lateral ventricles as before, typical in appearance for multiple sclerosis.  There is involvement of the brain stem as before. Cervical spine findings are below.  Orbit soft tissues are within normal limits.  Stable minor paranasal sinus mucosal thickening.  Mastoids remain clear. Negative scalp soft tissues.  IMPRESSION: 1.  Stable MRI appearance of chronic demyelinating disease of the brain.  No enhancement or evidence of acute demyelination. 2.  Cervical spine findings are below. 3.  No new intracranial abnormality.  MRI CERVICAL SPINE  Findings: Stable cervical vertebral height and alignment with preserved lordosis. No marrow edema or evidence of acute osseous abnormality.   Visualized paraspinal soft tissues are within normal limits.  Chronic T2 and STIR hyperintense right lateral spinal cord plaque at C3 is re-identified and not significantly changed.  Elsewhere the cervical spinal cord signal is within normal limits.  Upper thoracic spinal cord plaque is partially visible on the left at T2 and is also chronic.  No abnormal spinal cord or intradural enhancement to suggest acute demyelination.  Stable chronic predominately mild cervical spine degenerative changes.  There is minimal to mild spinal stenosis at C6-C7 with moderate to severe bilateral foraminal stenosis again noted, related to disc bulge and ligament flavum hypertrophy.  There is a mild chronic disc bulge at C3-C4 with chronic uncovertebral hypertrophy and mild to moderate to bilateral foraminal stenosis.  IMPRESSION: 1.  Stable chronic demyelinating lesions in the spinal cord at C3 and T2.  No new spinal cord signal abnormality.  No enhancement to suggest acute demyelination. 2.  Stable mild for age cervical degenerative changes.  Original Report Authenticated By: Harley Hallmark, M.D.   US Venous Img Lower Bilateral  05/08/2012  *RADIOLOGY REPORT*  Clinical Data: Bilateral leg weakness and hip pain, history of smoking, evaluate for DVT  BILATERAL LOWER EXTREMITY VENOUS DUPLEX ULTRASOUND  Technique:  Gray-scale sonography with graded compression, as well as color Doppler and duplex ultrasound, were performed to evaluate the deep venous system of both lower extremities from the level of the common femoral vein through the popliteal and proximal calf veins.  Spectral Doppler was utilized to evaluate flow at rest and with distal augmentation maneuvers.  Comparison:  None.  Findings:  Normal compressibility of bilateral common femoral, superficial femoral, and popliteal veins  is demonstrated, as well as the visualized proximal calf veins.  No filling defects to suggest DVT on grayscale or color Doppler imaging.  Doppler  waveforms show normal direction of venous flow, normal respiratory phasicity and response to augmentation.  IMPRESSION: No evidence of deep vein thrombosis, within either lower extremity.  Original Report Authenticated By: Waynard Reeds, M.D.    Medications: \ Scheduled Meds:    . aspirin EC  81 mg Oral Daily  . carbamazepine  200 mg Oral TID  . cholecalciferol  2,000 Units Oral BH-q7a  . docusate sodium  100 mg Oral BID  . enoxaparin (LOVENOX) injection  40 mg Subcutaneous Q24H  . gabapentin  600 mg Oral TID  . glatiramer  20 mg Subcutaneous Daily  . methylPREDNISolone (SOLU-MEDROL) injection  1,000 mg Intravenous Q24H  . sodium chloride  3 mL Intravenous Q12H   Continuous Infusions:    . sodium chloride 100 mL/hr at 05/09/12 1525   PRN Meds:.acetaminophen, acetaminophen, alum & mag hydroxide-simeth, gadobenate dimeglumine, HYDROcodone-acetaminophen, ondansetron (ZOFRAN) IV, ondansetron, polyethylene glycol, zolpidem  Assessment/Plan: See dict   LOS: 3 days   Jamille Yoshino

## 2012-05-10 NOTE — Progress Notes (Signed)
NAMEJOHNROSS, NABOZNY               ACCOUNT NO.:  [id number]  MEDICAL RECORD NO.:  [id number]  LOCATION:  [facility]                         FACILITY:  [facility]  PHYSICIAN:  Nai Borromeo A. Gerilyn Pilgrim, M.D. DATE OF BIRTH:  05/15/1961  DATE OF PROCEDURE: DATE OF DISCHARGE:  05/07/2012                                PROGRESS NOTE   HISTORY OF PRESENT ILLNESS:  The patient unfortunately had another spell of fever overnight.  He spiked to 102.  This was associated with marked worsening of neurological status.  He reports significant weakness and just did not feel right after the temperature spike again.  He has been worked up extensively and so far the workup has been unremarkable.  The patient did have MRI in which is reviewed and shows multiple findings consistent with MS, but nothing enhances, and no evidence of abscess or other processes.  Essentially, the finding is unchanged from previous scan.  PHYSICAL EXAMINATION:  Examination today, neck is supple.  Head is normocephalic, atraumatic.  Abdomen is soft.  Pupils are reactive.  He has full extraocular movements.  Visual fields are fine.  He has good strength in upper extremities.  Bulk and tone are also normal.  There is no dysmetria.  In the legs, he has actually gotten weaker since yesterday.  He is 3/5 in the legs.  ASSESSMENT AND PLAN:  Fever of unknown etiology.  This is causing the patient's neurological exam to deteriorate after he has these fevers. This is known phenomena with multiple sclerosis.  The patient is on IV Solu-Medrol to treat his multiple sclerosis.  Given that the patient's workup thus far has been negative in the regards to source of the fever, we will go ahead and arrange for spinal tap.  We will need to discontinue his Lovenox for now.  We will send a spinal tap for routine testing, herpes, PCR, cryptococcal antigen, and VDRL.     Aneth Schlagel A. Gerilyn Pilgrim, M.D.     KAD/MEDQ  D:  05/10/2012  T:  05/10/2012  Job:  [id number]

## 2012-05-10 NOTE — Progress Notes (Signed)
Subjective: This man had a fever yesterday again.  MRI of the spine is essentially unremarkable, there is no evidence of abscess.           Physical Exam: Blood pressure 130/76, pulse 74, temperature 98.1 F (36.7 C), temperature source Oral, resp. rate 20, height 6\' 4"  (1.93 m), weight 116.075 kg (255 lb 14.4 oz), SpO2 93.00%. He looks systemically well. He does not appear to be toxic or septic. Heart sounds are present and normal. Lung fields are clear. Abdomen is soft and nontender. Both his legs are clearly reduced and power, but improved, now 2/5 bilaterally. There is no clonus. There does not appear to be any back pain on bilateral hip flexion.   Investigations:  Recent Results (from the past 240 hour(s))  CULTURE, BLOOD (ROUTINE X 2)     Status: Normal (Preliminary result)   Collection Time   05/07/12  5:45 PM      Component Value Range Status Comment   Specimen Description BLOOD LEFT ARM   Final    Special Requests BOTTLES DRAWN AEROBIC AND ANAEROBIC 4CC   Final    Culture NO GROWTH 2 DAYS   Final    Report Status PENDING   Incomplete   URINE CULTURE     Status: Normal   Collection Time   05/07/12  5:55 PM      Component Value Range Status Comment   Specimen Description URINE, CATHETERIZED   Final    Special Requests NONE   Final    Culture  Setup Time 05/07/2012 21:53   Final    Colony Count NO GROWTH   Final    Culture NO GROWTH   Final    Report Status 05/09/2012 FINAL   Final   CULTURE, BLOOD (ROUTINE X 2)     Status: Normal (Preliminary result)   Collection Time   05/07/12  6:00 PM      Component Value Range Status Comment   Specimen Description BLOOD RIGHT ARM   Final    Special Requests BOTTLES DRAWN AEROBIC AND ANAEROBIC 6CC   Final    Culture NO GROWTH 2 DAYS   Final    Report Status PENDING   Incomplete      Basic Metabolic Panel:  Basename 05/07/12 1800  NA 135  K 4.1  CL 101  CO2 24  GLUCOSE 117*  BUN 13  CREATININE 1.04  CALCIUM 9.2    MG --  PHOS --     CBC:  Basename 05/07/12 1800  WBC 11.3*  NEUTROABS 9.7*  HGB 13.5  HCT 39.6  MCV 93.4  PLT 159    Mr Laqueta Jean Wo Contrast  05/09/2012  *RADIOLOGY REPORT*  Clinical Data:  51 year old male with lower extremity weakness. History of multiple sclerosis.  MRI HEAD WITHOUT AND WITH CONTRAST MRI CERVICAL SPINE WITHOUT AND WITH CONTRAST  Technique:  Multiplanar, multiecho pulse sequences of the brain and surrounding structures, and cervical spine, to include the craniocervical junction and cervicothoracic junction, were obtained without and with intravenous contrast.  Contrast: 20mL MULTIHANCE GADOBENATE DIMEGLUMINE 529 MG/ML IV SOLN  Comparison:  Thoracic spine study performed the previous day and reported separately.  Brain and cervical MRI 09/15/2010 and earlier.  MRI HEAD  Findings:  Normal cerebral volume. No restricted diffusion to suggest acute infarction.  No midline shift, mass effect, evidence of mass lesion, ventriculomegaly, extra-axial collection or acute intracranial hemorrhage.  Cervicomedullary junction and pituitary are within normal limits.  Major  intracranial vascular flow voids are stable, dominant left vertebral artery.  Normal bone marrow signal.  No abnormal enhancement identified.  Chronically advanced T2 and FLAIR hyperintense foci throughout the brain are stable since 2011. Cerebral hemisphere lesions are oriented perpendicular to the lateral ventricles as before, typical in appearance for multiple sclerosis.  There is involvement of the brain stem as before. Cervical spine findings are below.  Orbit soft tissues are within normal limits.  Stable minor paranasal sinus mucosal thickening.  Mastoids remain clear. Negative scalp soft tissues.  IMPRESSION: 1.  Stable MRI appearance of chronic demyelinating disease of the brain.  No enhancement or evidence of acute demyelination. 2.  Cervical spine findings are below. 3.  No new intracranial abnormality.  MRI CERVICAL  SPINE  Findings: Stable cervical vertebral height and alignment with preserved lordosis. No marrow edema or evidence of acute osseous abnormality.  Visualized paraspinal soft tissues are within normal limits.  Chronic T2 and STIR hyperintense right lateral spinal cord plaque at C3 is re-identified and not significantly changed.  Elsewhere the cervical spinal cord signal is within normal limits.  Upper thoracic spinal cord plaque is partially visible on the left at T2 and is also chronic.  No abnormal spinal cord or intradural enhancement to suggest acute demyelination.  Stable chronic predominately mild cervical spine degenerative changes.  There is minimal to mild spinal stenosis at C6-C7 with moderate to severe bilateral foraminal stenosis again noted, related to disc bulge and ligament flavum hypertrophy.  There is a mild chronic disc bulge at C3-C4 with chronic uncovertebral hypertrophy and mild to moderate to bilateral foraminal stenosis.  IMPRESSION: 1.  Stable chronic demyelinating lesions in the spinal cord at C3 and T2.  No new spinal cord signal abnormality.  No enhancement to suggest acute demyelination. 2.  Stable mild for age cervical degenerative changes.  Original Report Authenticated By: Harley Hallmark, M.D.   Mr Cervical Spine W Wo Contrast  05/09/2012  *RADIOLOGY REPORT*  Clinical Data:  51 year old male with lower extremity weakness. History of multiple sclerosis.  MRI HEAD WITHOUT AND WITH CONTRAST MRI CERVICAL SPINE WITHOUT AND WITH CONTRAST  Technique:  Multiplanar, multiecho pulse sequences of the brain and surrounding structures, and cervical spine, to include the craniocervical junction and cervicothoracic junction, were obtained without and with intravenous contrast.  Contrast: 20mL MULTIHANCE GADOBENATE DIMEGLUMINE 529 MG/ML IV SOLN  Comparison:  Thoracic spine study performed the previous day and reported separately.  Brain and cervical MRI 09/15/2010 and earlier.  MRI HEAD  Findings:   Normal cerebral volume. No restricted diffusion to suggest acute infarction.  No midline shift, mass effect, evidence of mass lesion, ventriculomegaly, extra-axial collection or acute intracranial hemorrhage.  Cervicomedullary junction and pituitary are within normal limits.  Major intracranial vascular flow voids are stable, dominant left vertebral artery.  Normal bone marrow signal.  No abnormal enhancement identified.  Chronically advanced T2 and FLAIR hyperintense foci throughout the brain are stable since 2011. Cerebral hemisphere lesions are oriented perpendicular to the lateral ventricles as before, typical in appearance for multiple sclerosis.  There is involvement of the brain stem as before. Cervical spine findings are below.  Orbit soft tissues are within normal limits.  Stable minor paranasal sinus mucosal thickening.  Mastoids remain clear. Negative scalp soft tissues.  IMPRESSION: 1.  Stable MRI appearance of chronic demyelinating disease of the brain.  No enhancement or evidence of acute demyelination. 2.  Cervical spine findings are below. 3.  No new intracranial abnormality.  MRI CERVICAL SPINE  Findings: Stable cervical vertebral height and alignment with preserved lordosis. No marrow edema or evidence of acute osseous abnormality.  Visualized paraspinal soft tissues are within normal limits.  Chronic T2 and STIR hyperintense right lateral spinal cord plaque at C3 is re-identified and not significantly changed.  Elsewhere the cervical spinal cord signal is within normal limits.  Upper thoracic spinal cord plaque is partially visible on the left at T2 and is also chronic.  No abnormal spinal cord or intradural enhancement to suggest acute demyelination.  Stable chronic predominately mild cervical spine degenerative changes.  There is minimal to mild spinal stenosis at C6-C7 with moderate to severe bilateral foraminal stenosis again noted, related to disc bulge and ligament flavum hypertrophy.  There  is a mild chronic disc bulge at C3-C4 with chronic uncovertebral hypertrophy and mild to moderate to bilateral foraminal stenosis.  IMPRESSION: 1.  Stable chronic demyelinating lesions in the spinal cord at C3 and T2.  No new spinal cord signal abnormality.  No enhancement to suggest acute demyelination. 2.  Stable mild for age cervical degenerative changes.  Original Report Authenticated By: Harley Hallmark, M.D.   Mr Thoracic Spine W Wo Contrast  05/08/2012  *RADIOLOGY REPORT*  Clinical Data: Lower extremity weakness.  Fever.  Multiple sclerosis.  MRI THORACIC SPINE WITHOUT AND WITH CONTRAST  Technique:  Multiplanar and multiecho pulse sequences of the thoracic spine were obtained without and with intravenous contrast.  Contrast: 20mL MULTIHANCE GADOBENATE DIMEGLUMINE 529 MG/ML IV SOLN  Comparison: Cervical MRI dated 09/15/2010  Findings: The patient has a subtle plaque in the left posterior lateral aspect of the thoracic spinal cord at T2, unchanged since the cervical MRI dated 09/15/2010.  There are also small focal plaques in the posterior columns at T10 and centrally in the spinal cord at T10-11.  The spinal cord is not enlarged.  There is no pathologic enhancement of the spinal cord or plaques after contrast administration.  The osseous structures of the thoracic spine are normal.  Discs are normal.  No foraminal or spinal stenosis.  The paraspinal soft tissues are normal.  IMPRESSION:  1.  Plaques in the thoracic spinal cord at T2, T10, and T11, most consistent with multiple sclerosis. 2.  No evidence of transverse myelitis or spinal abscess.  Original Report Authenticated By: Gwynn Burly, M.D.   Mr Lumbar Spine W Wo Contrast  05/08/2012  *RADIOLOGY REPORT*  Clinical Data: Lower extremity weakness.  Fever.  Multiple sclerosis.  MRI LUMBAR SPINE WITHOUT AND WITH CONTRAST  Technique:  Multiplanar and multiecho pulse sequences of the lumbar spine were obtained without and with intravenous contrast.   Contrast: 20mL MULTIHANCE GADOBENATE DIMEGLUMINE 529 MG/ML IV SOLN  Comparison: MRI dated 02/25/2012  Findings: Scan extends from T11-12 through S2.  Tip of the conus is at L1-2 and appears normal.  T11-12:  Normal.  T12-L1:  Small disc bulge central and slightly to the right without neural impingement without change.  L1-2:  Small broad-based disc bulge with no neural impingement, unchanged.  L2-3:  Far lateral disc bulges to the right and left without neural impingement and without change.  L3-4:  Far lateral annular tears of the right and left without neural impingement, unchanged.  Moderate bilateral facet arthritis. Slight narrowing of the spinal canal, unchanged.  L4-5:  Moderate bilateral facet hypertrophy.  Small annular tear to the left.  No neural impingement.  L5 S1:  5 mm spondylolisthesis with slight bulging of the uncovered  disc with slight narrowing of the neural foramina, right greater than left, without focal neural impingement, unchanged.  IMPRESSION:   Multilevel degenerative disc disease without focal neural impingement.  No change since the prior exam.  No significant enhancement after contrast administration.  Original Report Authenticated By: Gwynn Burly, M.D.   US Venous Img Lower Bilateral  05/08/2012  *RADIOLOGY REPORT*  Clinical Data: Bilateral leg weakness and hip pain, history of smoking, evaluate for DVT  BILATERAL LOWER EXTREMITY VENOUS DUPLEX ULTRASOUND  Technique:  Gray-scale sonography with graded compression, as well as color Doppler and duplex ultrasound, were performed to evaluate the deep venous system of both lower extremities from the level of the common femoral vein through the popliteal and proximal calf veins.  Spectral Doppler was utilized to evaluate flow at rest and with distal augmentation maneuvers.  Comparison:  None.  Findings:  Normal compressibility of bilateral common femoral, superficial femoral, and popliteal veins is demonstrated, as well as the  visualized proximal calf veins.  No filling defects to suggest DVT on grayscale or color Doppler imaging.  Doppler waveforms show normal direction of venous flow, normal respiratory phasicity and response to augmentation.  IMPRESSION: No evidence of deep vein thrombosis, within either lower extremity.  Original Report Authenticated By: Waynard Reeds, M.D.      Medications: I have reviewed the patient's current medications.  Impression: 1. Multiple sclerosis exacerbation, improving. 2. Fever, unclear etiology. 3. Hypertension. 4. Polysubstance drug abuse, positive for cocaine and marijuana. 5. Tobacco abuse.     Plan: 1. Continue current therapy. 2. Consider lumbar puncture.     LOS: 3 days   Wilson Singer Pager [phone]  05/10/2012, 7:37 AM

## 2012-05-11 ENCOUNTER — Inpatient Hospital Stay (HOSPITAL_COMMUNITY): Payer: Medicare Other

## 2012-05-11 LAB — CSF CELL COUNT WITH DIFFERENTIAL: WBC, CSF: 4 /mm3 (ref 0–5)

## 2012-05-11 MED ORDER — DEXTROSE 5 % IV SOLN
2.0000 g | Freq: Two times a day (BID) | INTRAVENOUS | Status: DC
  Administered 2012-05-11 – 2012-05-13 (×4): 2 g via INTRAVENOUS
  Filled 2012-05-11 (×10): qty 2

## 2012-05-11 MED ORDER — SODIUM CHLORIDE 0.9 % IV SOLN
1.0000 g | Freq: Four times a day (QID) | INTRAVENOUS | Status: DC
  Filled 2012-05-11 (×4): qty 1000

## 2012-05-11 MED ORDER — ACETAMINOPHEN 650 MG RE SUPP
650.0000 mg | RECTAL | Status: DC | PRN
  Filled 2012-05-11: qty 1

## 2012-05-11 MED ORDER — VANCOMYCIN HCL 1000 MG IV SOLR
1500.0000 mg | Freq: Once | INTRAVENOUS | Status: AC
  Administered 2012-05-11: 1500 mg via INTRAVENOUS
  Filled 2012-05-11: qty 1500

## 2012-05-11 MED ORDER — MORPHINE SULFATE 4 MG/ML IJ SOLN
4.0000 mg | INTRAMUSCULAR | Status: DC | PRN
  Administered 2012-05-11 – 2012-05-13 (×4): 4 mg via INTRAVENOUS
  Filled 2012-05-11 (×4): qty 1

## 2012-05-11 MED ORDER — ACETAMINOPHEN 325 MG PO TABS
650.0000 mg | ORAL_TABLET | Freq: Four times a day (QID) | ORAL | Status: DC | PRN
  Administered 2012-05-11 – 2012-05-18 (×10): 650 mg via ORAL
  Filled 2012-05-11 (×11): qty 2

## 2012-05-11 MED ORDER — SODIUM CHLORIDE 0.9 % IV SOLN
2.0000 g | Freq: Four times a day (QID) | INTRAVENOUS | Status: DC
  Administered 2012-05-11 – 2012-05-12 (×4): 2 g via INTRAVENOUS
  Filled 2012-05-11 (×16): qty 2000

## 2012-05-11 MED ORDER — SODIUM CHLORIDE 0.9 % IV SOLN
1250.0000 mg | Freq: Three times a day (TID) | INTRAVENOUS | Status: DC
  Administered 2012-05-12 – 2012-05-14 (×7): 1250 mg via INTRAVENOUS
  Filled 2012-05-11 (×17): qty 1250

## 2012-05-11 MED ORDER — DEXTROSE 5 % IV SOLN
2.0000 g | INTRAVENOUS | Status: DC
  Filled 2012-05-11: qty 2

## 2012-05-11 NOTE — Progress Notes (Signed)
Subjective: Interval History:   Objective: Vital signs in last 24 hours: Temp:  [97.7 F (36.5 C)-103.5 F (39.7 C)] 103.5 F (39.7 C) (08/08 1709) Pulse Rate:  [72-91] 85  (08/08 1400) Resp:  [18-20] 18  (08/08 1400) BP: (125-157)/(73-77) 157/76 mmHg (08/08 1400) SpO2:  [95 %-96 %] 96 % (08/08 1400)  Intake/Output from previous day: 08/07 0701 - 08/08 0700 In: 3263.3 [P.O.:960; I.V.:2303.3] Out: 3800 [Urine:3800] Intake/Output this shift: Total I/O In: 840 [P.O.:840] Out: 750 [Urine:750] Nutritional status: General    Lab Results: No results found for this basename: WBC:2,HGB:2,HCT:2,PLT:2,NA:2,K:2,CL:2,CO2:2,GLUCOSE:2,BUN:2,CREATININE:2,CALCIUM:2,LABA1C in the last 72 hours Lipid Panel No results found for this basename: CHOL,TRIG,HDL,CHOLHDL,VLDL,LDLCALC in the last 72 hours  Studies/Results: Dg Fluoro Guide Lumbar Puncture  05/11/2012  *RADIOLOGY REPORT*  Clinical Data: Multiple Sclerosis, fever and headaches.  DIAGNOSTIC LUMBAR PUNCTURE UNDER FLUOROSCOPIC GUIDANCE  Fluoroscopy time:  1.8. minutes.  Technique:  Informed consent was obtained from the patient prior to the procedure, including potential complications of headache, allergy, and pain. Lovenox was withheld for 1.5 days.  With the patient prone oblique, the lower back was prepped with Betadine. 1% Lidocaine was used for local anesthesia.  Lumbar puncture was performed at the L4-L5 level using a 12.7 cm 20  gauge needle with return of blood tinged CSF with an opening pressure of 19.2. cm water.   Only three ml of CSF were obtained for laboratory studies, even after placing the  patient in reverse Trendelenburg position. The patient tolerated the procedure well and there were no apparent complications.  IMPRESSION: Slightly elevated opening pressure of 19.2 cm water.  Approximately 3 ml of blood-tinged CSF was obtained for pathological analysis.  Original Report Authenticated By: Brandon Melnick, M.D.    Medications:  \ Scheduled Meds:    . ampicillin (OMNIPEN) IV  2 g Intravenous Q6H  . aspirin EC  81 mg Oral Daily  . carbamazepine  200 mg Oral TID  . cefTRIAXone (ROCEPHIN)  IV  2 g Intravenous Q12H  . cholecalciferol  2,000 Units Oral BH-q7a  . docusate sodium  100 mg Oral BID  . gabapentin  600 mg Oral TID  . glatiramer  20 mg Subcutaneous Daily  . methylPREDNISolone (SOLU-MEDROL) injection  1,000 mg Intravenous Q24H  . sodium chloride  3 mL Intravenous Q12H  . vancomycin  1,250 mg Intravenous Q8H  . vancomycin  1,500 mg Intravenous Once  . DISCONTD: ampicillin (OMNIPEN) IV  1 g Intravenous Q6H  . DISCONTD: cefTRIAXone (ROCEPHIN)  IV  2 g Intravenous Q24H   Continuous Infusions:    . sodium chloride 100 mL/hr at 05/11/12 0630   PRN Meds:.acetaminophen, acetaminophen, alum & mag hydroxide-simeth, HYDROcodone-acetaminophen, morphine injection, ondansetron (ZOFRAN) IV, ondansetron, polyethylene glycol, zolpidem, DISCONTD: acetaminophen, DISCONTD: acetaminophen  Assessment/Plan: See dict   LOS: 4 days   Endiya Klahr

## 2012-05-11 NOTE — Progress Notes (Signed)
Pt states that he just doesn't feel up to having any PT today.  He states that the fever he had yesterday at about noon has exhausted him.  He anticipates having an LP today.  Will try again tomorrow.

## 2012-05-11 NOTE — Progress Notes (Signed)
NAMESACRAMENTO, MONDS               ACCOUNT NO.:  [id number]  MEDICAL RECORD NO.:  [id number]  LOCATION:  A303                          FACILITY:  [facility]  PHYSICIAN:  Ilyaas Musto A. Gerilyn Pilgrim, M.D. DATE OF BIRTH:  1961-09-04  DATE OF PROCEDURE: DATE OF DISCHARGE:  05/07/2012                                PROGRESS NOTE   HISTORY OF PRESENT ILLNESS:  The patient unfortunately has had recurrent fevers.  Today, he spiked as high as 103.5, and he has had few of these spikes.  Again unfortunately, whenever he has this, he becomes paralyzed in his legs, especially above 102.  He reports markedly severe leg weakness today.  The patient did undergo a spinal tap, but unfortunately the procedure was difficult, and he only got 1 mL per the lab although the radiologist does indicate 3 mL were obtained.  I did speak to the lab, but they were only able to run 1 test, and I instructed them to do a cell count.  The WBCs 4, RBCs 5000, and the tap was a traumatic tap.  PHYSICAL EXAMINATION:  Today, he is awake and alert.  Neck is supple. Head is normocephalic, atraumatic.  Visual fields are full.  Extraocular movements are intact.  Facial muscle strength is symmetric.  He has good strength, tone and bulk in the upper extremities.  He is profoundly weak in the legs.  Strength graded as 1/5.  Again, he has been spiking temperatures throughout the day.  The patient has been placed on antibiotics by Dr. Karilyn Cota.  ASSESSMENT AND PLAN:  Fever of unknown origin.  Spinal fluid analysis is very limited but does not support evidence of infection with a normal wbc's.  The worsen neurological function is due to the patient's repeated fevers and not necessarily a primary neurological problem.  His MRI essentially shows stable diminishing lesions from his multiple sclerosis.  I think he is still reasonable to complete the 4- to 5-day course of Solu-Medrol.  Given that we have not found the source of his infection, I think  we should consider additional subspecialty input, particularly from infectious disease and possibly even oncology.  I will discuss this with Dr. Karilyn Cota.  Consider possible transfer to [facility].     Oneisha Ammons A. Gerilyn Pilgrim, M.D.     KAD/MEDQ  D:  05/11/2012  T:  05/11/2012  Job:  [id number]

## 2012-05-11 NOTE — Progress Notes (Signed)
ANTIBIOTIC CONSULT NOTE - INITIAL  Pharmacy Consult for Vancomycin Indication: ? Meningitis  No Known Allergies  Patient Measurements: Height: 6\' 4"  (193 cm) Weight: 255 lb 14.4 oz (116.075 kg) IBW/kg (Calculated) : 86.8    Vital Signs: Temp: 97.7 F (36.5 C) (08/08 0533) Temp src: Oral (08/08 0533) BP: 125/77 mmHg (08/08 0533) Pulse Rate: 72  (08/08 0533) Intake/Output from previous day: 08/07 0701 - 08/08 0700 In: 3263.3 [P.O.:960; I.V.:2303.3] Out: 3800 [Urine:3800] Intake/Output from this shift:    Labs: No results found for this basename: WBC:3,HGB:3,PLT:3,LABCREA:3,CREATININE:3 in the last 72 hours Estimated Creatinine Clearance: 114.4 ml/min (by C-G formula based on Cr of 1.04). No results found for this basename: VANCOTROUGH:2,VANCOPEAK:2,VANCORANDOM:2,GENTTROUGH:2,GENTPEAK:2,GENTRANDOM:2,TOBRATROUGH:2,TOBRAPEAK:2,TOBRARND:2,AMIKACINPEAK:2,AMIKACINTROU:2,AMIKACIN:2, in the last 72 hours   Microbiology: Recent Results (from the past 720 hour(s))  CULTURE, BLOOD (ROUTINE X 2)     Status: Normal (Preliminary result)   Collection Time   05/07/12  5:45 PM      Component Value Range Status Comment   Specimen Description BLOOD LEFT ARM   Final    Special Requests BOTTLES DRAWN AEROBIC AND ANAEROBIC 4CC   Final    Culture NO GROWTH 4 DAYS   Final    Report Status PENDING   Incomplete   URINE CULTURE     Status: Normal   Collection Time   05/07/12  5:55 PM      Component Value Range Status Comment   Specimen Description URINE, CATHETERIZED   Final    Special Requests NONE   Final    Culture  Setup Time 05/07/2012 21:53   Final    Colony Count NO GROWTH   Final    Culture NO GROWTH   Final    Report Status 05/09/2012 FINAL   Final   CULTURE, BLOOD (ROUTINE X 2)     Status: Normal (Preliminary result)   Collection Time   05/07/12  6:00 PM      Component Value Range Status Comment   Specimen Description BLOOD RIGHT ARM   Final    Special Requests BOTTLES DRAWN AEROBIC  AND ANAEROBIC 6CC   Final    Culture NO GROWTH 4 DAYS   Final    Report Status PENDING   Incomplete   CULTURE, BLOOD (ROUTINE X 2)     Status: Normal (Preliminary result)   Collection Time   05/10/12  5:05 PM      Component Value Range Status Comment   Specimen Description BLOOD LEFT HAND   Final    Special Requests BOTTLES DRAWN AEROBIC AND ANAEROBIC 12CC   Final    Culture NO GROWTH 1 DAY   Final    Report Status PENDING   Incomplete   CULTURE, BLOOD (ROUTINE X 2)     Status: Normal (Preliminary result)   Collection Time   05/10/12  5:10 PM      Component Value Range Status Comment   Specimen Description BLOOD RIGHT ANTECUBITAL   Final    Special Requests BOTTLES DRAWN AEROBIC AND ANAEROBIC 12CC   Final    Culture NO GROWTH 1 DAY   Final    Report Status PENDING   Incomplete     Medical History: Past Medical History  Diagnosis Date  . Multiple sclerosis     Medications:  Scheduled:    . ampicillin (OMNIPEN) IV  1 g Intravenous Q6H  . aspirin EC  81 mg Oral Daily  . carbamazepine  200 mg Oral TID  . cefTRIAXone (ROCEPHIN)  IV  2  g Intravenous Q24H  . cholecalciferol  2,000 Units Oral BH-q7a  . docusate sodium  100 mg Oral BID  . gabapentin  600 mg Oral TID  . glatiramer  20 mg Subcutaneous Daily  . ibuprofen  800 mg Oral Once  . methylPREDNISolone (SOLU-MEDROL) injection  1,000 mg Intravenous Q24H  . sodium chloride  3 mL Intravenous Q12H  . vancomycin  1,250 mg Intravenous Q8H  . vancomycin  1,500 mg Intravenous Once   Assessment: Okay for Protocol Estimated Creatinine Clearance: 114.4 ml/min (by C-G formula based on Cr of 1.04).  Goal of Therapy:  Vancomycin trough level 15-20 mcg/ml  Plan:  Vancomycin 1500mg  x 1, then 1250mg  IV every 8 hours. Measure antibiotic drug levels at steady state Follow up culture results  Mady Gemma 05/11/2012,3:04 PM

## 2012-05-11 NOTE — Progress Notes (Signed)
Subjective: This man does not appear to have had a fever overnight that was documented. He is due to have lumbar puncture today.           Physical Exam: Blood pressure 125/77, pulse 72, temperature 97.7 F (36.5 C), temperature source Oral, resp. rate 20, height 6\' 4"  (1.93 m), weight 116.075 kg (255 lb 14.4 oz), SpO2 96.00%. He looks systemically well. He does not appear to be toxic or septic. Heart sounds are present and normal. Lung fields are clear. Abdomen is soft and nontender. Both his legs are clearly reduced and power, but improved, now 2/5 bilaterally. There is no clonus. There does not appear to be any back pain on bilateral hip flexion.   Investigations:  Recent Results (from the past 240 hour(s))  CULTURE, BLOOD (ROUTINE X 2)     Status: Normal (Preliminary result)   Collection Time   05/07/12  5:45 PM      Component Value Range Status Comment   Specimen Description BLOOD LEFT ARM   Final    Special Requests BOTTLES DRAWN AEROBIC AND ANAEROBIC 4CC   Final    Culture NO GROWTH 3 DAYS   Final    Report Status PENDING   Incomplete   URINE CULTURE     Status: Normal   Collection Time   05/07/12  5:55 PM      Component Value Range Status Comment   Specimen Description URINE, CATHETERIZED   Final    Special Requests NONE   Final    Culture  Setup Time 05/07/2012 21:53   Final    Colony Count NO GROWTH   Final    Culture NO GROWTH   Final    Report Status 05/09/2012 FINAL   Final   CULTURE, BLOOD (ROUTINE X 2)     Status: Normal (Preliminary result)   Collection Time   05/07/12  6:00 PM      Component Value Range Status Comment   Specimen Description BLOOD RIGHT ARM   Final    Special Requests BOTTLES DRAWN AEROBIC AND ANAEROBIC 6CC   Final    Culture NO GROWTH 3 DAYS   Final    Report Status PENDING   Incomplete      Basic Metabolic Panel:    CBC:   Mr Laqueta Jean Wo Contrast  05/09/2012  *RADIOLOGY REPORT*  Clinical Data:  51 year old male with lower  extremity weakness. History of multiple sclerosis.  MRI HEAD WITHOUT AND WITH CONTRAST MRI CERVICAL SPINE WITHOUT AND WITH CONTRAST  Technique:  Multiplanar, multiecho pulse sequences of the brain and surrounding structures, and cervical spine, to include the craniocervical junction and cervicothoracic junction, were obtained without and with intravenous contrast.  Contrast: 20mL MULTIHANCE GADOBENATE DIMEGLUMINE 529 MG/ML IV SOLN  Comparison:  Thoracic spine study performed the previous day and reported separately.  Brain and cervical MRI 09/15/2010 and earlier.  MRI HEAD  Findings:  Normal cerebral volume. No restricted diffusion to suggest acute infarction.  No midline shift, mass effect, evidence of mass lesion, ventriculomegaly, extra-axial collection or acute intracranial hemorrhage.  Cervicomedullary junction and pituitary are within normal limits.  Major intracranial vascular flow voids are stable, dominant left vertebral artery.  Normal bone marrow signal.  No abnormal enhancement identified.  Chronically advanced T2 and FLAIR hyperintense foci throughout the brain are stable since 2011. Cerebral hemisphere lesions are oriented perpendicular to the lateral ventricles as before, typical in appearance for multiple sclerosis.  There is involvement of  the brain stem as before. Cervical spine findings are below.  Orbit soft tissues are within normal limits.  Stable minor paranasal sinus mucosal thickening.  Mastoids remain clear. Negative scalp soft tissues.  IMPRESSION: 1.  Stable MRI appearance of chronic demyelinating disease of the brain.  No enhancement or evidence of acute demyelination. 2.  Cervical spine findings are below. 3.  No new intracranial abnormality.  MRI CERVICAL SPINE  Findings: Stable cervical vertebral height and alignment with preserved lordosis. No marrow edema or evidence of acute osseous abnormality.  Visualized paraspinal soft tissues are within normal limits.  Chronic T2 and STIR  hyperintense right lateral spinal cord plaque at C3 is re-identified and not significantly changed.  Elsewhere the cervical spinal cord signal is within normal limits.  Upper thoracic spinal cord plaque is partially visible on the left at T2 and is also chronic.  No abnormal spinal cord or intradural enhancement to suggest acute demyelination.  Stable chronic predominately mild cervical spine degenerative changes.  There is minimal to mild spinal stenosis at C6-C7 with moderate to severe bilateral foraminal stenosis again noted, related to disc bulge and ligament flavum hypertrophy.  There is a mild chronic disc bulge at C3-C4 with chronic uncovertebral hypertrophy and mild to moderate to bilateral foraminal stenosis.  IMPRESSION: 1.  Stable chronic demyelinating lesions in the spinal cord at C3 and T2.  No new spinal cord signal abnormality.  No enhancement to suggest acute demyelination. 2.  Stable mild for age cervical degenerative changes.  Original Report Authenticated By: Harley Hallmark, M.D.   Mr Cervical Spine W Wo Contrast  05/09/2012  *RADIOLOGY REPORT*  Clinical Data:  51 year old male with lower extremity weakness. History of multiple sclerosis.  MRI HEAD WITHOUT AND WITH CONTRAST MRI CERVICAL SPINE WITHOUT AND WITH CONTRAST  Technique:  Multiplanar, multiecho pulse sequences of the brain and surrounding structures, and cervical spine, to include the craniocervical junction and cervicothoracic junction, were obtained without and with intravenous contrast.  Contrast: 20mL MULTIHANCE GADOBENATE DIMEGLUMINE 529 MG/ML IV SOLN  Comparison:  Thoracic spine study performed the previous day and reported separately.  Brain and cervical MRI 09/15/2010 and earlier.  MRI HEAD  Findings:  Normal cerebral volume. No restricted diffusion to suggest acute infarction.  No midline shift, mass effect, evidence of mass lesion, ventriculomegaly, extra-axial collection or acute intracranial hemorrhage.  Cervicomedullary  junction and pituitary are within normal limits.  Major intracranial vascular flow voids are stable, dominant left vertebral artery.  Normal bone marrow signal.  No abnormal enhancement identified.  Chronically advanced T2 and FLAIR hyperintense foci throughout the brain are stable since 2011. Cerebral hemisphere lesions are oriented perpendicular to the lateral ventricles as before, typical in appearance for multiple sclerosis.  There is involvement of the brain stem as before. Cervical spine findings are below.  Orbit soft tissues are within normal limits.  Stable minor paranasal sinus mucosal thickening.  Mastoids remain clear. Negative scalp soft tissues.  IMPRESSION: 1.  Stable MRI appearance of chronic demyelinating disease of the brain.  No enhancement or evidence of acute demyelination. 2.  Cervical spine findings are below. 3.  No new intracranial abnormality.  MRI CERVICAL SPINE  Findings: Stable cervical vertebral height and alignment with preserved lordosis. No marrow edema or evidence of acute osseous abnormality.  Visualized paraspinal soft tissues are within normal limits.  Chronic T2 and STIR hyperintense right lateral spinal cord plaque at C3 is re-identified and not significantly changed.  Elsewhere the cervical spinal cord  signal is within normal limits.  Upper thoracic spinal cord plaque is partially visible on the left at T2 and is also chronic.  No abnormal spinal cord or intradural enhancement to suggest acute demyelination.  Stable chronic predominately mild cervical spine degenerative changes.  There is minimal to mild spinal stenosis at C6-C7 with moderate to severe bilateral foraminal stenosis again noted, related to disc bulge and ligament flavum hypertrophy.  There is a mild chronic disc bulge at C3-C4 with chronic uncovertebral hypertrophy and mild to moderate to bilateral foraminal stenosis.  IMPRESSION: 1.  Stable chronic demyelinating lesions in the spinal cord at C3 and T2.  No new  spinal cord signal abnormality.  No enhancement to suggest acute demyelination. 2.  Stable mild for age cervical degenerative changes.  Original Report Authenticated By: Harley Hallmark, M.D.      Medications: I have reviewed the patient's current medications.  Impression: 1. Multiple sclerosis exacerbation, improving. 2. Fever, unclear etiology. 3. Hypertension. 4. Polysubstance drug abuse, positive for cocaine and marijuana. 5. Tobacco abuse.     Plan: 1. Continue current therapy. 2. Lumbar puncture today.     LOS: 4 days   Wilson Singer Pager [phone]  05/11/2012, 8:15 AM

## 2012-05-12 ENCOUNTER — Inpatient Hospital Stay (HOSPITAL_COMMUNITY): Payer: Medicare Other

## 2012-05-12 ENCOUNTER — Telehealth: Payer: Self-pay | Admitting: Physician Assistant

## 2012-05-12 DIAGNOSIS — G8929 Other chronic pain: Secondary | ICD-10-CM

## 2012-05-12 LAB — CULTURE, BLOOD (ROUTINE X 2)
Culture: NO GROWTH
Culture: NO GROWTH

## 2012-05-12 LAB — COMPREHENSIVE METABOLIC PANEL
ALT: 229 U/L — ABNORMAL HIGH (ref 0–53)
AST: 53 U/L — ABNORMAL HIGH (ref 0–37)
Albumin: 2.8 g/dL — ABNORMAL LOW (ref 3.5–5.2)
Alkaline Phosphatase: 92 U/L (ref 39–117)
Calcium: 8.8 mg/dL (ref 8.4–10.5)
Potassium: 3.9 mEq/L (ref 3.5–5.1)
Sodium: 138 mEq/L (ref 135–145)
Total Protein: 6.3 g/dL (ref 6.0–8.3)

## 2012-05-12 LAB — CBC
MCHC: 35 g/dL (ref 30.0–36.0)
RDW: 13.3 % (ref 11.5–15.5)
WBC: 11.5 10*3/uL — ABNORMAL HIGH (ref 4.0–10.5)

## 2012-05-12 LAB — SEDIMENTATION RATE: Sed Rate: 42 mm/hr — ABNORMAL HIGH (ref 0–16)

## 2012-05-12 MED ORDER — IBUPROFEN 600 MG PO TABS
600.0000 mg | ORAL_TABLET | Freq: Four times a day (QID) | ORAL | Status: DC | PRN
  Filled 2012-05-12: qty 1

## 2012-05-12 MED ORDER — INSULIN ASPART 100 UNIT/ML ~~LOC~~ SOLN
0.0000 [IU] | Freq: Every day | SUBCUTANEOUS | Status: DC
  Administered 2012-05-14: 2 [IU] via SUBCUTANEOUS

## 2012-05-12 MED ORDER — PANTOPRAZOLE SODIUM 40 MG PO TBEC
40.0000 mg | DELAYED_RELEASE_TABLET | Freq: Two times a day (BID) | ORAL | Status: DC
  Administered 2012-05-13 – 2012-05-20 (×14): 40 mg via ORAL
  Filled 2012-05-12 (×13): qty 1

## 2012-05-12 MED ORDER — LOSARTAN POTASSIUM 50 MG PO TABS
100.0000 mg | ORAL_TABLET | Freq: Every day | ORAL | Status: DC
  Administered 2012-05-12 – 2012-05-16 (×4): 100 mg via ORAL
  Filled 2012-05-12 (×6): qty 2

## 2012-05-12 MED ORDER — IOHEXOL 300 MG/ML  SOLN
100.0000 mL | Freq: Once | INTRAMUSCULAR | Status: AC | PRN
  Administered 2012-05-12: 100 mL via INTRAVENOUS

## 2012-05-12 MED ORDER — INSULIN ASPART 100 UNIT/ML ~~LOC~~ SOLN
0.0000 [IU] | Freq: Three times a day (TID) | SUBCUTANEOUS | Status: DC
  Administered 2012-05-16: 2 [IU] via SUBCUTANEOUS
  Administered 2012-05-18: 3 [IU] via SUBCUTANEOUS
  Administered 2012-05-18 – 2012-05-19 (×4): 2 [IU] via SUBCUTANEOUS

## 2012-05-12 NOTE — Progress Notes (Addendum)
This patient with multiple sclerosis, on high dose intravenous steroids, continues to have fevers. The source of his fevers have not been clear so far. One set of blood cultures have shown no growth after 5 days and another set, taken when he had a fever, have shown no growth after 2 days. Urine culture is negative. CSF taken via lumbar puncture yesterday does not show any clear evidence of infection. His white blood cell count is 11-12 range and his ESR is not particularly impressive at 30 and 42. He does not appear to be toxic/septic clinically. In view of negative investigations so far, a CT scan of his abdomen and chest were done today. The CT scan of the abdomen is unremarkable but a CT chest scan does show pathologic mediastinal and hilar lymphadenopathy.  Impression: 1. Fever of unclear etiology. 2. Multiple sclerosis with current high dose intravenous steroids. I've spoken with neurology, Dr Gerilyn Pilgrim, who feels that patient has had almost 5 days of high-dose steroids and we can stop them now. 3. Pathologic mediastinal and hilar lymphadenopathy. 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 I will also check HIV status. He would benefit from cardiothoracic consultation and also infectious disease consultation for further management. He is now being transferred to [facility] with this in mind.

## 2012-05-12 NOTE — Progress Notes (Signed)
[facility] PROGRESS NOTE  JQUAN EGELSTON JYN:[id number] DOB: 19-Jun-1961 DOA: 05/07/2012   Assessment/Plan: Patient Active Hospital Problem List: Multiple sclerosis exacerbation (05/07/2012) -currently stable has completed a five-day course of Solu-Medrol. We'll do physical therapy to see him.  Fever (05/07/2012) -quite concerning that he is having night sweats but high fevers has been on empiric treatment with vancomycin Rocephin and he continues to spike fevers. A CT scan of the chest concerning for lymphadenopathy or check an HIV if the HIV comes back negative I will go ahead and consult cardiothoracic for biopsy of the lymphadenopathy. -Blood cultures x2 continue to be negative CSF cultures are negative urine cultures are negative, TSH is within normal limits the ESR continues to be high.  HTN (hypertension) (05/07/2012) -blood pressure borderline high. Resume her medication. This was not recently started.  Chronic pain (05/07/2012) -continue narcotics and ibuprofen. Get physical therapy.  Polysubstance abuse (05/07/2012) -counseling.   Code Status: Full code Family Communication: [phone] Disposition Plan: To determine     LOS: 5 days   Procedures:    Antibiotics:  Vancomycin and Rocephin   Subjective: Patient relates he still has lower extremity pain.  Objective: Filed Vitals:   05/12/12 1413 05/12/12 1532 05/12/12 1627 05/12/12 1631  BP: 119/71 158/81 179/84 167/84  Pulse: 71 77 84   Temp: 97.8 F (36.6 C) 98.1 F (36.7 C) 98.3 F (36.8 C)   TempSrc: Oral Oral Oral   Resp: 20 20 20    Height:   6\' 4"  (1.93 m)   Weight:      SpO2: 98% 98% 98%     Intake/Output Summary (Last 24 hours) at 05/12/12 1656 Last data filed at 05/12/12 0830  Gross per 24 hour  Intake    240 ml  Output   2700 ml  Net  -2460 ml   Weight change:   Exam:  General: Alert, awake, oriented x3, in no acute distress.  HEENT: No bruits, no goiter.  Heart: Regular rate and  rhythm, without murmurs, rubs, gallops.  Lungs: Good air movement, bilateral air movement.  Abdomen: Soft, nontender, nondistended, positive bowel sounds.  Neuro: Grossly intact, nonfocal. No hyperreflexia the   Data Reviewed: Basic Metabolic Panel:  Lab 05/12/12 9528 05/07/12 1800 05/07/12 0542  NA 138 135 138  K 3.9 4.1 --  CL 103 101 102  CO2 24 24 25   GLUCOSE 324* 117* 114*  BUN 19 13 14   CREATININE 0.95 1.04 1.08  CALCIUM 8.8 9.2 9.6  MG -- -- --  PHOS -- -- --   Liver Function Tests:  Lab 05/12/12 0513 05/07/12 0542  AST 53* 18  ALT 229* 37  ALKPHOS 92 69  BILITOT 0.3 0.2*  PROT 6.3 6.9  ALBUMIN 2.8* 3.8   No results found for this basename: LIPASE:5,AMYLASE:5 in the last 168 hours No results found for this basename: AMMONIA:5 in the last 168 hours CBC:  Lab 05/12/12 0513 05/07/12 1800 05/07/12 0542  WBC 11.5* 11.3* 12.6*  NEUTROABS -- 9.7* 10.3*  HGB 12.2* 13.5 13.4  HCT 34.9* 39.6 39.1  MCV 91.6 93.4 93.1  PLT 187 159 180   Cardiac Enzymes: No results found for this basename: CKTOTAL:5,CKMB:5,CKMBINDEX:5,TROPONINI:5 in the last 168 hours BNP: No components found with this basename: POCBNP:5 CBG: No results found for this basename: GLUCAP:5 in the last 168 hours  Recent Results (from the past 240 hour(s))  CULTURE, BLOOD (ROUTINE X 2)     Status: Normal   Collection Time  05/07/12  5:45 PM      Component Value Range Status Comment   Specimen Description BLOOD LEFT ARM   Final    Special Requests BOTTLES DRAWN AEROBIC AND ANAEROBIC 4CC   Final    Culture NO GROWTH 5 DAYS   Final    Report Status 05/12/2012 FINAL   Final   URINE CULTURE     Status: Normal   Collection Time   05/07/12  5:55 PM      Component Value Range Status Comment   Specimen Description URINE, CATHETERIZED   Final    Special Requests NONE   Final    Culture  Setup Time 05/07/2012 21:53   Final    Colony Count NO GROWTH   Final    Culture NO GROWTH   Final    Report Status  05/09/2012 FINAL   Final   CULTURE, BLOOD (ROUTINE X 2)     Status: Normal   Collection Time   05/07/12  6:00 PM      Component Value Range Status Comment   Specimen Description BLOOD RIGHT ARM   Final    Special Requests BOTTLES DRAWN AEROBIC AND ANAEROBIC 6CC   Final    Culture NO GROWTH 5 DAYS   Final    Report Status 05/12/2012 FINAL   Final   CULTURE, BLOOD (ROUTINE X 2)     Status: Normal (Preliminary result)   Collection Time   05/10/12  5:05 PM      Component Value Range Status Comment   Specimen Description BLOOD LEFT HAND   Final    Special Requests BOTTLES DRAWN AEROBIC AND ANAEROBIC 12CC   Final    Culture NO GROWTH 2 DAYS   Final    Report Status PENDING   Incomplete   CULTURE, BLOOD (ROUTINE X 2)     Status: Normal (Preliminary result)   Collection Time   05/10/12  5:10 PM      Component Value Range Status Comment   Specimen Description BLOOD RIGHT ANTECUBITAL   Final    Special Requests BOTTLES DRAWN AEROBIC AND ANAEROBIC 12CC   Final    Culture NO GROWTH 2 DAYS   Final    Report Status PENDING   Incomplete      Studies: Ct Chest W Contrast  05/12/2012  *RADIOLOGY REPORT*  Clinical Data:  Fever unknown origin.  Chronic back pain.  Multiple sclerosis.  Weakness.  CT CHEST, ABDOMEN AND PELVIS WITH CONTRAST  Technique:  Multidetector CT imaging of the chest, abdomen and pelvis was performed following the standard protocol during bolus administration of intravenous contrast.  Contrast: 1 OMNIPAQUE IOHEXOL 300 MG/ML  SOLN (100 ml)  Comparison:  Multiple exams, including 05/07/2012  CT CHEST  Findings:  Right paratracheal node measures 1.3 cm in short axis, image 17 of series 3.  An AP window lymph node measures 1.6 cm in short axis, image 24 of series 3.  Right hilar adenopathy noted with one right hilar node measuring 1.5 cm in short axis, image 32 of series 3.  Left hilar adenopathy noted with several small calcifications, and also scattered infrahilar nodes.  Conglomerate  subcarinal adenopathy measures 2.2 cm in short axis, image 30 of series 3.  Scattered small paraesophageal lymph nodes are present in the lower thorax.  Heart size is within normal limits.  A small hiatal hernia is present.  A 4 mm densely calcified right upper lobe pulmonary nodule on image 14 of series 4 is compatible with  calcified granuloma.  Centrilobular emphysema noted.  There is mild atelectasis or scarring in the posterior basal segments of both lower lobes. Several additional small calcified right upper lobe granulomas are present.  There are several small calcified granulomas in the left lung.  IMPRESSION:  1.  Pathologic mediastinal and hilar adenopathy.  No airway thickening or pleural nodularity to suggest pulmonary parenchymal findings of sarcoidosis.  The appearance could reflect lymphoma. Tissue diagnosis is likely warranted. 2.  Small hiatal hernia. 3.  Old granulomatous disease.  CT ABDOMEN AND PELVIS  Findings:  Diffuse mild low density in the liver suggests diffuse mild hepatic steatosis.  The spleen, adrenal glands, and pancreas appear unremarkable.  The gallbladder and biliary system appear unremarkable.  Small peripancreatic lymph nodes do not appear pathologically enlarged by size criteria.  The right kidney appears normal.  Several small hypodense lesions in the left kidney are technically too small to characterize although statistically likely represent cysts.  The renal calculi referenced on the prior ultrasound examination from 07/06/2001 are not observed on today's exam. No pathologic pelvic adenopathy is identified.  The appendix is within normal limits.  No significant colonic diverticular disease observed.  No dilated bowel noted.  Urinary bladder appears unremarkable.  Degenerative grade 1 anterolisthesis of L5 on S1 is noted.  IMPRESSION:  1.  The suspected diffuse mild hepatic steatosis. 2.  Several small hypodense lesions in the left kidney are likely cyst but technically too  small to characterize. 3.  Degenerative grade 1 anterolisthesis of L5 on S1.  Original Report Authenticated By: Dellia Cloud, M.D.   Mr Laqueta Jean Wo Contrast  05/09/2012  *RADIOLOGY REPORT*  Clinical Data:  51 year old male with lower extremity weakness. History of multiple sclerosis.  MRI HEAD WITHOUT AND WITH CONTRAST MRI CERVICAL SPINE WITHOUT AND WITH CONTRAST  Technique:  Multiplanar, multiecho pulse sequences of the brain and surrounding structures, and cervical spine, to include the craniocervical junction and cervicothoracic junction, were obtained without and with intravenous contrast.  Contrast: 20mL MULTIHANCE GADOBENATE DIMEGLUMINE 529 MG/ML IV SOLN  Comparison:  Thoracic spine study performed the previous day and reported separately.  Brain and cervical MRI 09/15/2010 and earlier.  MRI HEAD  Findings:  Normal cerebral volume. No restricted diffusion to suggest acute infarction.  No midline shift, mass effect, evidence of mass lesion, ventriculomegaly, extra-axial collection or acute intracranial hemorrhage.  Cervicomedullary junction and pituitary are within normal limits.  Major intracranial vascular flow voids are stable, dominant left vertebral artery.  Normal bone marrow signal.  No abnormal enhancement identified.  Chronically advanced T2 and FLAIR hyperintense foci throughout the brain are stable since 2011. Cerebral hemisphere lesions are oriented perpendicular to the lateral ventricles as before, typical in appearance for multiple sclerosis.  There is involvement of the brain stem as before. Cervical spine findings are below.  Orbit soft tissues are within normal limits.  Stable minor paranasal sinus mucosal thickening.  Mastoids remain clear. Negative scalp soft tissues.  IMPRESSION: 1.  Stable MRI appearance of chronic demyelinating disease of the brain.  No enhancement or evidence of acute demyelination. 2.  Cervical spine findings are below. 3.  No new intracranial abnormality.  MRI  CERVICAL SPINE  Findings: Stable cervical vertebral height and alignment with preserved lordosis. No marrow edema or evidence of acute osseous abnormality.  Visualized paraspinal soft tissues are within normal limits.  Chronic T2 and STIR hyperintense right lateral spinal cord plaque at C3 is re-identified and not significantly changed.  Elsewhere  the cervical spinal cord signal is within normal limits.  Upper thoracic spinal cord plaque is partially visible on the left at T2 and is also chronic.  No abnormal spinal cord or intradural enhancement to suggest acute demyelination.  Stable chronic predominately mild cervical spine degenerative changes.  There is minimal to mild spinal stenosis at C6-C7 with moderate to severe bilateral foraminal stenosis again noted, related to disc bulge and ligament flavum hypertrophy.  There is a mild chronic disc bulge at C3-C4 with chronic uncovertebral hypertrophy and mild to moderate to bilateral foraminal stenosis.  IMPRESSION: 1.  Stable chronic demyelinating lesions in the spinal cord at C3 and T2.  No new spinal cord signal abnormality.  No enhancement to suggest acute demyelination. 2.  Stable mild for age cervical degenerative changes.  Original Report Authenticated By: Harley Hallmark, M.D.   Mr Cervical Spine W Wo Contrast  05/09/2012  *RADIOLOGY REPORT*  Clinical Data:  52 year old male with lower extremity weakness. History of multiple sclerosis.  MRI HEAD WITHOUT AND WITH CONTRAST MRI CERVICAL SPINE WITHOUT AND WITH CONTRAST  Technique:  Multiplanar, multiecho pulse sequences of the brain and surrounding structures, and cervical spine, to include the craniocervical junction and cervicothoracic junction, were obtained without and with intravenous contrast.  Contrast: 20mL MULTIHANCE GADOBENATE DIMEGLUMINE 529 MG/ML IV SOLN  Comparison:  Thoracic spine study performed the previous day and reported separately.  Brain and cervical MRI 09/15/2010 and earlier.  MRI HEAD   Findings:  Normal cerebral volume. No restricted diffusion to suggest acute infarction.  No midline shift, mass effect, evidence of mass lesion, ventriculomegaly, extra-axial collection or acute intracranial hemorrhage.  Cervicomedullary junction and pituitary are within normal limits.  Major intracranial vascular flow voids are stable, dominant left vertebral artery.  Normal bone marrow signal.  No abnormal enhancement identified.  Chronically advanced T2 and FLAIR hyperintense foci throughout the brain are stable since 2011. Cerebral hemisphere lesions are oriented perpendicular to the lateral ventricles as before, typical in appearance for multiple sclerosis.  There is involvement of the brain stem as before. Cervical spine findings are below.  Orbit soft tissues are within normal limits.  Stable minor paranasal sinus mucosal thickening.  Mastoids remain clear. Negative scalp soft tissues.  IMPRESSION: 1.  Stable MRI appearance of chronic demyelinating disease of the brain.  No enhancement or evidence of acute demyelination. 2.  Cervical spine findings are below. 3.  No new intracranial abnormality.  MRI CERVICAL SPINE  Findings: Stable cervical vertebral height and alignment with preserved lordosis. No marrow edema or evidence of acute osseous abnormality.  Visualized paraspinal soft tissues are within normal limits.  Chronic T2 and STIR hyperintense right lateral spinal cord plaque at C3 is re-identified and not significantly changed.  Elsewhere the cervical spinal cord signal is within normal limits.  Upper thoracic spinal cord plaque is partially visible on the left at T2 and is also chronic.  No abnormal spinal cord or intradural enhancement to suggest acute demyelination.  Stable chronic predominately mild cervical spine degenerative changes.  There is minimal to mild spinal stenosis at C6-C7 with moderate to severe bilateral foraminal stenosis again noted, related to disc bulge and ligament flavum  hypertrophy.  There is a mild chronic disc bulge at C3-C4 with chronic uncovertebral hypertrophy and mild to moderate to bilateral foraminal stenosis.  IMPRESSION: 1.  Stable chronic demyelinating lesions in the spinal cord at C3 and T2.  No new spinal cord signal abnormality.  No enhancement to suggest acute  demyelination. 2.  Stable mild for age cervical degenerative changes.  Original Report Authenticated By: Harley Hallmark, M.D.   Mr Thoracic Spine W Wo Contrast  05/08/2012  *RADIOLOGY REPORT*  Clinical Data: Lower extremity weakness.  Fever.  Multiple sclerosis.  MRI THORACIC SPINE WITHOUT AND WITH CONTRAST  Technique:  Multiplanar and multiecho pulse sequences of the thoracic spine were obtained without and with intravenous contrast.  Contrast: 20mL MULTIHANCE GADOBENATE DIMEGLUMINE 529 MG/ML IV SOLN  Comparison: Cervical MRI dated 09/15/2010  Findings: The patient has a subtle plaque in the left posterior lateral aspect of the thoracic spinal cord at T2, unchanged since the cervical MRI dated 09/15/2010.  There are also small focal plaques in the posterior columns at T10 and centrally in the spinal cord at T10-11.  The spinal cord is not enlarged.  There is no pathologic enhancement of the spinal cord or plaques after contrast administration.  The osseous structures of the thoracic spine are normal.  Discs are normal.  No foraminal or spinal stenosis.  The paraspinal soft tissues are normal.  IMPRESSION:  1.  Plaques in the thoracic spinal cord at T2, T10, and T11, most consistent with multiple sclerosis. 2.  No evidence of transverse myelitis or spinal abscess.  Original Report Authenticated By: Gwynn Burly, M.D.   Mr Lumbar Spine W Wo Contrast  05/08/2012  *RADIOLOGY REPORT*  Clinical Data: Lower extremity weakness.  Fever.  Multiple sclerosis.  MRI LUMBAR SPINE WITHOUT AND WITH CONTRAST  Technique:  Multiplanar and multiecho pulse sequences of the lumbar spine were obtained without and with  intravenous contrast.  Contrast: 20mL MULTIHANCE GADOBENATE DIMEGLUMINE 529 MG/ML IV SOLN  Comparison: MRI dated 02/25/2012  Findings: Scan extends from T11-12 through S2.  Tip of the conus is at L1-2 and appears normal.  T11-12:  Normal.  T12-L1:  Small disc bulge central and slightly to the right without neural impingement without change.  L1-2:  Small broad-based disc bulge with no neural impingement, unchanged.  L2-3:  Far lateral disc bulges to the right and left without neural impingement and without change.  L3-4:  Far lateral annular tears of the right and left without neural impingement, unchanged.  Moderate bilateral facet arthritis. Slight narrowing of the spinal canal, unchanged.  L4-5:  Moderate bilateral facet hypertrophy.  Small annular tear to the left.  No neural impingement.  L5 S1:  5 mm spondylolisthesis with slight bulging of the uncovered disc with slight narrowing of the neural foramina, right greater than left, without focal neural impingement, unchanged.  IMPRESSION:   Multilevel degenerative disc disease without focal neural impingement.  No change since the prior exam.  No significant enhancement after contrast administration.  Original Report Authenticated By: Gwynn Burly, M.D.   Ct Abdomen Pelvis W Contrast  05/12/2012  *RADIOLOGY REPORT*  Clinical Data:  Fever unknown origin.  Chronic back pain.  Multiple sclerosis.  Weakness.  CT CHEST, ABDOMEN AND PELVIS WITH CONTRAST  Technique:  Multidetector CT imaging of the chest, abdomen and pelvis was performed following the standard protocol during bolus administration of intravenous contrast.  Contrast: 1 OMNIPAQUE IOHEXOL 300 MG/ML  SOLN (100 ml)  Comparison:  Multiple exams, including 05/07/2012  CT CHEST  Findings:  Right paratracheal node measures 1.3 cm in short axis, image 17 of series 3.  An AP window lymph node measures 1.6 cm in short axis, image 24 of series 3.  Right hilar adenopathy noted with one right hilar node measuring  1.5  cm in short axis, image 32 of series 3.  Left hilar adenopathy noted with several small calcifications, and also scattered infrahilar nodes.  Conglomerate subcarinal adenopathy measures 2.2 cm in short axis, image 30 of series 3.  Scattered small paraesophageal lymph nodes are present in the lower thorax.  Heart size is within normal limits.  A small hiatal hernia is present.  A 4 mm densely calcified right upper lobe pulmonary nodule on image 14 of series 4 is compatible with calcified granuloma.  Centrilobular emphysema noted.  There is mild atelectasis or scarring in the posterior basal segments of both lower lobes. Several additional small calcified right upper lobe granulomas are present.  There are several small calcified granulomas in the left lung.  IMPRESSION:  1.  Pathologic mediastinal and hilar adenopathy.  No airway thickening or pleural nodularity to suggest pulmonary parenchymal findings of sarcoidosis.  The appearance could reflect lymphoma. Tissue diagnosis is likely warranted. 2.  Small hiatal hernia. 3.  Old granulomatous disease.  CT ABDOMEN AND PELVIS  Findings:  Diffuse mild low density in the liver suggests diffuse mild hepatic steatosis.  The spleen, adrenal glands, and pancreas appear unremarkable.  The gallbladder and biliary system appear unremarkable.  Small peripancreatic lymph nodes do not appear pathologically enlarged by size criteria.  The right kidney appears normal.  Several small hypodense lesions in the left kidney are technically too small to characterize although statistically likely represent cysts.  The renal calculi referenced on the prior ultrasound examination from 07/06/2001 are not observed on today's exam. No pathologic pelvic adenopathy is identified.  The appendix is within normal limits.  No significant colonic diverticular disease observed.  No dilated bowel noted.  Urinary bladder appears unremarkable.  Degenerative grade 1 anterolisthesis of L5 on S1 is noted.   IMPRESSION:  1.  The suspected diffuse mild hepatic steatosis. 2.  Several small hypodense lesions in the left kidney are likely cyst but technically too small to characterize. 3.  Degenerative grade 1 anterolisthesis of L5 on S1.  Original Report Authenticated By: Dellia Cloud, M.D.   US Venous Img Lower Bilateral  05/08/2012  *RADIOLOGY REPORT*  Clinical Data: Bilateral leg weakness and hip pain, history of smoking, evaluate for DVT  BILATERAL LOWER EXTREMITY VENOUS DUPLEX ULTRASOUND  Technique:  Gray-scale sonography with graded compression, as well as color Doppler and duplex ultrasound, were performed to evaluate the deep venous system of both lower extremities from the level of the common femoral vein through the popliteal and proximal calf veins.  Spectral Doppler was utilized to evaluate flow at rest and with distal augmentation maneuvers.  Comparison:  None.  Findings:  Normal compressibility of bilateral common femoral, superficial femoral, and popliteal veins is demonstrated, as well as the visualized proximal calf veins.  No filling defects to suggest DVT on grayscale or color Doppler imaging.  Doppler waveforms show normal direction of venous flow, normal respiratory phasicity and response to augmentation.  IMPRESSION: No evidence of deep vein thrombosis, within either lower extremity.  Original Report Authenticated By: Waynard Reeds, M.D.   Dg Chest Port 1 View  05/07/2012  *RADIOLOGY REPORT*  Clinical Data: Fever.  Infiltrate.  PORTABLE CHEST - 1 VIEW  Comparison: Multiple priors most recently 05/17/2012 at 1535 hours. Chest CT 01/13/2007.  Findings: Left costophrenic angle excluded from view.  Left basilar subsegmental atelectasis.  Prominent right pericardial fat pad. Cardiopericardial silhouette is within normal limits.  No airspace disease.  No effusion.  Calcified granuloma  in the right upper lobe and punctate calcifications of the mediastinal lymph nodes again noted compatible  with old granulomatous disease.  IMPRESSION: No acute cardiopulmonary disease.  Subsegmental left basilar atelectasis.  Original Report Authenticated By: Andreas Newport, M.D.   Dg Chest Port 1 View  05/07/2012  *RADIOLOGY REPORT*  Clinical Data: Weakness.  Fall.  Multiple sclerosis.  Smoker.  PORTABLE CHEST - 1 VIEW  Comparison: 06/14/2011 chest radiograph.  CT chest 01/13/2007  Findings: The normal heart size and pulmonary vascularity. Calcified granulomas in the mid lungs.  Nodular hilar opacities consistent with prominent lymph nodes and granulomas as seen on previous chest CT.  Mild emphysematous changes in the upper lungs. No focal airspace consolidation.  No blunting of costophrenic angles.  No pneumothorax.  Old left rib fracture.  IMPRESSION: Calcified granulomas with calcified hilar lymph nodes. Emphysematous changes.  No evidence of active pulmonary disease.  Original Report Authenticated By: Marlon Pel, M.D.   Dg Fluoro Guide Lumbar Puncture  05/11/2012  *RADIOLOGY REPORT*  Clinical Data: Multiple Sclerosis, fever and headaches.  DIAGNOSTIC LUMBAR PUNCTURE UNDER FLUOROSCOPIC GUIDANCE  Fluoroscopy time:  1.8. minutes.  Technique:  Informed consent was obtained from the patient prior to the procedure, including potential complications of headache, allergy, and pain. Lovenox was withheld for 1.5 days.  With the patient prone oblique, the lower back was prepped with Betadine. 1% Lidocaine was used for local anesthesia.  Lumbar puncture was performed at the L4-L5 level using a 12.7 cm 20  gauge needle with return of blood tinged CSF with an opening pressure of 19.2. cm water.   Only three ml of CSF were obtained for laboratory studies, even after placing the  patient in reverse Trendelenburg position. The patient tolerated the procedure well and there were no apparent complications.  IMPRESSION: Slightly elevated opening pressure of 19.2 cm water.  Approximately 3 ml of blood-tinged CSF was  obtained for pathological analysis.  Original Report Authenticated By: Brandon Melnick, M.D.    Scheduled Meds:   . aspirin EC  81 mg Oral Daily  . carbamazepine  200 mg Oral TID  . cefTRIAXone (ROCEPHIN)  IV  2 g Intravenous Q12H  . cholecalciferol  2,000 Units Oral [id number]  . docusate sodium  100 mg Oral BID  . gabapentin  600 mg Oral TID  . glatiramer  20 mg Subcutaneous Daily  . sodium chloride  3 mL Intravenous Q12H  . vancomycin  1,250 mg Intravenous Q8H  . vancomycin  1,500 mg Intravenous Once  . DISCONTD: ampicillin (OMNIPEN) IV  2 g Intravenous Q6H  . DISCONTD: methylPREDNISolone (SOLU-MEDROL) injection  1,000 mg Intravenous Q24H   Continuous Infusions:   . sodium chloride 100 mL/hr at 05/11/12 2112    Lambert Keto, MD  [facility] Pager [phone]  If 7PM-7AM, please contact night-coverage [email or web address] Password [facility] 05/12/2012, 4:56 PM

## 2012-05-12 NOTE — Progress Notes (Signed)
Subjective: Interval History:   Objective: Vital signs in last 24 hours: Temp:  [97.8 F (36.6 C)-103.5 F (39.7 C)] 97.8 F (36.6 C) (08/09 0526) Pulse Rate:  [73-117] 73  (08/09 0526) Resp:  [18-22] 20  (08/09 0526) BP: (103-157)/(68-76) 103/68 mmHg (08/09 0526) SpO2:  [92 %-96 %] 95 % (08/09 0526) Weight:  [113.263 kg (249 lb 11.2 oz)] 113.263 kg (249 lb 11.2 oz) (08/09 0526)  Intake/Output from previous day: 08/08 0701 - 08/09 0700 In: 1080 [P.O.:1080] Out: 2625 [Urine:2625] Intake/Output this shift: Total I/O In: -  Out: 825 [Urine:825] Nutritional status: General    Lab Results:  Basename 05/12/12 0513  WBC 11.5*  HGB 12.2*  HCT 34.9*  PLT 187  NA 138  K 3.9  CL 103  CO2 24  GLUCOSE 324*  BUN 19  CREATININE 0.95  CALCIUM 8.8  LABA1C --   Lipid Panel No results found for this basename: CHOL,TRIG,HDL,CHOLHDL,VLDL,LDLCALC in the last 72 hours  Studies/Results: Ct Chest W Contrast  05/12/2012  *RADIOLOGY REPORT*  Clinical Data:  Fever unknown origin.  Chronic back pain.  Multiple sclerosis.  Weakness.  CT CHEST, ABDOMEN AND PELVIS WITH CONTRAST  Technique:  Multidetector CT imaging of the chest, abdomen and pelvis was performed following the standard protocol during bolus administration of intravenous contrast.  Contrast: 1 OMNIPAQUE IOHEXOL 300 MG/ML  SOLN (100 ml)  Comparison:  Multiple exams, including 05/07/2012  CT CHEST  Findings:  Right paratracheal node measures 1.3 cm in short axis, image 17 of series 3.  An AP window lymph node measures 1.6 cm in short axis, image 24 of series 3.  Right hilar adenopathy noted with one right hilar node measuring 1.5 cm in short axis, image 32 of series 3.  Left hilar adenopathy noted with several small calcifications, and also scattered infrahilar nodes.  Conglomerate subcarinal adenopathy measures 2.2 cm in short axis, image 30 of series 3.  Scattered small paraesophageal lymph nodes are present in the lower thorax.  Heart  size is within normal limits.  A small hiatal hernia is present.  A 4 mm densely calcified right upper lobe pulmonary nodule on image 14 of series 4 is compatible with calcified granuloma.  Centrilobular emphysema noted.  There is mild atelectasis or scarring in the posterior basal segments of both lower lobes. Several additional small calcified right upper lobe granulomas are present.  There are several small calcified granulomas in the left lung.  IMPRESSION:  1.  Pathologic mediastinal and hilar adenopathy.  No airway thickening or pleural nodularity to suggest pulmonary parenchymal findings of sarcoidosis.  The appearance could reflect lymphoma. Tissue diagnosis is likely warranted. 2.  Small hiatal hernia. 3.  Old granulomatous disease.  CT ABDOMEN AND PELVIS  Findings:  Diffuse mild low density in the liver suggests diffuse mild hepatic steatosis.  The spleen, adrenal glands, and pancreas appear unremarkable.  The gallbladder and biliary system appear unremarkable.  Small peripancreatic lymph nodes do not appear pathologically enlarged by size criteria.  The right kidney appears normal.  Several small hypodense lesions in the left kidney are technically too small to characterize although statistically likely represent cysts.  The renal calculi referenced on the prior ultrasound examination from 07/06/2001 are not observed on today's exam. No pathologic pelvic adenopathy is identified.  The appendix is within normal limits.  No significant colonic diverticular disease observed.  No dilated bowel noted.  Urinary bladder appears unremarkable.  Degenerative grade 1 anterolisthesis of L5 on S1 is  noted.  IMPRESSION:  1.  The suspected diffuse mild hepatic steatosis. 2.  Several small hypodense lesions in the left kidney are likely cyst but technically too small to characterize. 3.  Degenerative grade 1 anterolisthesis of L5 on S1.  Original Report Authenticated By: Dellia Cloud, M.D.   Ct Abdomen Pelvis W  Contrast  05/12/2012  *RADIOLOGY REPORT*  Clinical Data:  Fever unknown origin.  Chronic back pain.  Multiple sclerosis.  Weakness.  CT CHEST, ABDOMEN AND PELVIS WITH CONTRAST  Technique:  Multidetector CT imaging of the chest, abdomen and pelvis was performed following the standard protocol during bolus administration of intravenous contrast.  Contrast: 1 OMNIPAQUE IOHEXOL 300 MG/ML  SOLN (100 ml)  Comparison:  Multiple exams, including 05/07/2012  CT CHEST  Findings:  Right paratracheal node measures 1.3 cm in short axis, image 17 of series 3.  An AP window lymph node measures 1.6 cm in short axis, image 24 of series 3.  Right hilar adenopathy noted with one right hilar node measuring 1.5 cm in short axis, image 32 of series 3.  Left hilar adenopathy noted with several small calcifications, and also scattered infrahilar nodes.  Conglomerate subcarinal adenopathy measures 2.2 cm in short axis, image 30 of series 3.  Scattered small paraesophageal lymph nodes are present in the lower thorax.  Heart size is within normal limits.  A small hiatal hernia is present.  A 4 mm densely calcified right upper lobe pulmonary nodule on image 14 of series 4 is compatible with calcified granuloma.  Centrilobular emphysema noted.  There is mild atelectasis or scarring in the posterior basal segments of both lower lobes. Several additional small calcified right upper lobe granulomas are present.  There are several small calcified granulomas in the left lung.  IMPRESSION:  1.  Pathologic mediastinal and hilar adenopathy.  No airway thickening or pleural nodularity to suggest pulmonary parenchymal findings of sarcoidosis.  The appearance could reflect lymphoma. Tissue diagnosis is likely warranted. 2.  Small hiatal hernia. 3.  Old granulomatous disease.  CT ABDOMEN AND PELVIS  Findings:  Diffuse mild low density in the liver suggests diffuse mild hepatic steatosis.  The spleen, adrenal glands, and pancreas appear unremarkable.  The  gallbladder and biliary system appear unremarkable.  Small peripancreatic lymph nodes do not appear pathologically enlarged by size criteria.  The right kidney appears normal.  Several small hypodense lesions in the left kidney are technically too small to characterize although statistically likely represent cysts.  The renal calculi referenced on the prior ultrasound examination from 07/06/2001 are not observed on today's exam. No pathologic pelvic adenopathy is identified.  The appendix is within normal limits.  No significant colonic diverticular disease observed.  No dilated bowel noted.  Urinary bladder appears unremarkable.  Degenerative grade 1 anterolisthesis of L5 on S1 is noted.  IMPRESSION:  1.  The suspected diffuse mild hepatic steatosis. 2.  Several small hypodense lesions in the left kidney are likely cyst but technically too small to characterize. 3.  Degenerative grade 1 anterolisthesis of L5 on S1.  Original Report Authenticated By: Dellia Cloud, M.D.   Dg Fluoro Guide Lumbar Puncture  05/11/2012  *RADIOLOGY REPORT*  Clinical Data: Multiple Sclerosis, fever and headaches.  DIAGNOSTIC LUMBAR PUNCTURE UNDER FLUOROSCOPIC GUIDANCE  Fluoroscopy time:  1.8. minutes.  Technique:  Informed consent was obtained from the patient prior to the procedure, including potential complications of headache, allergy, and pain. Lovenox was withheld for 1.5 days.  With the patient  prone oblique, the lower back was prepped with Betadine. 1% Lidocaine was used for local anesthesia.  Lumbar puncture was performed at the L4-L5 level using a 12.7 cm 20  gauge needle with return of blood tinged CSF with an opening pressure of 19.2. cm water.   Only three ml of CSF were obtained for laboratory studies, even after placing the  patient in reverse Trendelenburg position. The patient tolerated the procedure well and there were no apparent complications.  IMPRESSION: Slightly elevated opening pressure of 19.2 cm water.   Approximately 3 ml of blood-tinged CSF was obtained for pathological analysis.  Original Report Authenticated By: Brandon Melnick, M.D.    Medications: \ Scheduled Meds:    . ampicillin (OMNIPEN) IV  2 g Intravenous Q6H  . aspirin EC  81 mg Oral Daily  . carbamazepine  200 mg Oral TID  . cefTRIAXone (ROCEPHIN)  IV  2 g Intravenous Q12H  . cholecalciferol  2,000 Units Oral BH-q7a  . docusate sodium  100 mg Oral BID  . gabapentin  600 mg Oral TID  . glatiramer  20 mg Subcutaneous Daily  . methylPREDNISolone (SOLU-MEDROL) injection  1,000 mg Intravenous Q24H  . sodium chloride  3 mL Intravenous Q12H  . vancomycin  1,250 mg Intravenous Q8H  . vancomycin  1,500 mg Intravenous Once  . DISCONTD: ampicillin (OMNIPEN) IV  1 g Intravenous Q6H  . DISCONTD: cefTRIAXone (ROCEPHIN)  IV  2 g Intravenous Q24H   Continuous Infusions:    . sodium chloride 100 mL/hr at 05/11/12 2112   PRN Meds:.acetaminophen, acetaminophen, alum & mag hydroxide-simeth, HYDROcodone-acetaminophen, iohexol, morphine injection, ondansetron (ZOFRAN) IV, ondansetron, polyethylene glycol, zolpidem, DISCONTD: acetaminophen, DISCONTD: acetaminophen  Assessment/Plan: See dict   LOS: 5 days   Arnie Clingenpeel

## 2012-05-12 NOTE — Progress Notes (Signed)
[facility] [place] A. Gerilyn Pilgrim, MD     [email or web address]         SUBJECTIVE: The patient reports that he is feeling a lot better today. He has only had one fever spike overnight a 103 but today he has done very well. His legs have improved.  OBJECTIVE:  The patient is awake and alert. He is lucid and coherent. He continues to have good strength in the upper extremities as usual. Today he actually has very good strength in the legs compared to yesterday. His leg strength on the right is essentially 5/5. The left is 4+/5.  The patient's chest CT scan shows left adenoma suspicious for lymphoma. Abdominal CT is unremarkable.  ASSESSMENT/PLAN: Fever of unknown origin. The lymphadenopathy involving the chest is suspicious for lymphoma. The patient continues to have rapidly fluctuating neurological symptoms related to his fever. This is likely a known phenomenon from his MS. I will go ahead and discontinue the high dose unmeasurable for his MS. The patient will likely need tissue biopsy. I did discuss the case with the hospitalist. Also suggested HIV testing. The hospitalist relates to me that he is being transferred to [facility] cone of further diagnosis and treatment.

## 2012-05-12 NOTE — Telephone Encounter (Signed)
Telephone call with [facility].  Conversation with Dr. Wynona Neat, is a 51 y.o. male,   MRN: [id number]  -  DOB - 1960-10-08  Outpatient Primary MD for the patient is Colette Ribas, MD   @V @   51 year old male with a history of MS was admitted to [facility] approximately 3 days ago unable to walk and with Fever of unknown etiology (102 - 103.5).  No source was found in work up.  Lumbar puncture was bloody but showed no source of infection.  CT Chest revealed pathologic mediastinal lymphadenopathy.  Patient coming to [facility] for ID consultation and Cardio-Thoracic surgery evaluation & possible biopsy.  I have requested a Med/Surg bed on [facility].   Chad Downs, PA-C [facility] Pager: [phone]

## 2012-05-12 NOTE — Progress Notes (Signed)
Subjective: This man unfortunately continues to get fevers. CSF is unremarkable, not particularly indicative of meningitis or encephalitis. It was a bloody tap.Marland Kitchen           Physical Exam: Blood pressure 103/68, pulse 73, temperature 97.8 F (36.6 C), temperature source Oral, resp. rate 20, height 6\' 4"  (1.93 m), weight 113.263 kg (249 lb 11.2 oz), SpO2 95.00%. He looks systemically well. He does not appear to be toxic or septic. Heart sounds are present and normal. Lung fields are clear. Abdomen is soft and nontender. Both his legs are clearly reduced and power, but improved, now 2/5 bilaterally. There is no clonus. There does not appear to be any back pain on bilateral hip flexion.   Investigations:  Recent Results (from the past 240 hour(s))  CULTURE, BLOOD (ROUTINE X 2)     Status: Normal   Collection Time   05/07/12  5:45 PM      Component Value Range Status Comment   Specimen Description BLOOD LEFT ARM   Final    Special Requests BOTTLES DRAWN AEROBIC AND ANAEROBIC 4CC   Final    Culture NO GROWTH 5 DAYS   Final    Report Status 05/12/2012 FINAL   Final   URINE CULTURE     Status: Normal   Collection Time   05/07/12  5:55 PM      Component Value Range Status Comment   Specimen Description URINE, CATHETERIZED   Final    Special Requests NONE   Final    Culture  Setup Time 05/07/2012 21:53   Final    Colony Count NO GROWTH   Final    Culture NO GROWTH   Final    Report Status 05/09/2012 FINAL   Final   CULTURE, BLOOD (ROUTINE X 2)     Status: Normal   Collection Time   05/07/12  6:00 PM      Component Value Range Status Comment   Specimen Description BLOOD RIGHT ARM   Final    Special Requests BOTTLES DRAWN AEROBIC AND ANAEROBIC 6CC   Final    Culture NO GROWTH 5 DAYS   Final    Report Status 05/12/2012 FINAL   Final   CULTURE, BLOOD (ROUTINE X 2)     Status: Normal (Preliminary result)   Collection Time   05/10/12  5:05 PM      Component Value Range Status Comment    Specimen Description BLOOD LEFT HAND   Final    Special Requests BOTTLES DRAWN AEROBIC AND ANAEROBIC 12CC   Final    Culture NO GROWTH 2 DAYS   Final    Report Status PENDING   Incomplete   CULTURE, BLOOD (ROUTINE X 2)     Status: Normal (Preliminary result)   Collection Time   05/10/12  5:10 PM      Component Value Range Status Comment   Specimen Description BLOOD RIGHT ANTECUBITAL   Final    Special Requests BOTTLES DRAWN AEROBIC AND ANAEROBIC 12CC   Final    Culture NO GROWTH 2 DAYS   Final    Report Status PENDING   Incomplete      Basic Metabolic Panel:    CBC:   Dg Fluoro Guide Lumbar Puncture  05/11/2012  *RADIOLOGY REPORT*  Clinical Data: Multiple Sclerosis, fever and headaches.  DIAGNOSTIC LUMBAR PUNCTURE UNDER FLUOROSCOPIC GUIDANCE  Fluoroscopy time:  1.8. minutes.  Technique:  Informed consent was obtained from the patient prior to the procedure, including  potential complications of headache, allergy, and pain. Lovenox was withheld for 1.5 days.  With the patient prone oblique, the lower back was prepped with Betadine. 1% Lidocaine was used for local anesthesia.  Lumbar puncture was performed at the L4-L5 level using a 12.7 cm 20  gauge needle with return of blood tinged CSF with an opening pressure of 19.2. cm water.   Only three ml of CSF were obtained for laboratory studies, even after placing the  patient in reverse Trendelenburg position. The patient tolerated the procedure well and there were no apparent complications.  IMPRESSION: Slightly elevated opening pressure of 19.2 cm water.  Approximately 3 ml of blood-tinged CSF was obtained for pathological analysis.  Original Report Authenticated By: Brandon Melnick, M.D.      Medications: I have reviewed the patient's current medications.  Impression: 1. Multiple sclerosis exacerbation, stable. 2. Fever, unclear etiology. Blood cultures have been negative so far. No hard evidence for meningitis or encephalitis. 3.  Hypertension. 4. Polysubstance drug abuse, positive for cocaine and marijuana. 5. Tobacco abuse.     Plan: 1. CT scan of the abdomen and chest looking for any other infectious source such as an abscess. 2. If the above investigations are negative, I would strongly consider transferring this patient to [facility] where infectious disease consultants can also see this patient and give further recommendations.     LOS: 5 days   Wilson Singer Pager [phone]  05/12/2012, 8:37 AM

## 2012-05-13 DIAGNOSIS — R599 Enlarged lymph nodes, unspecified: Secondary | ICD-10-CM

## 2012-05-13 LAB — GLUCOSE, CAPILLARY: Glucose-Capillary: 128 mg/dL — ABNORMAL HIGH (ref 70–99)

## 2012-05-13 LAB — CBC
HCT: 38.4 % — ABNORMAL LOW (ref 39.0–52.0)
Hemoglobin: 13.1 g/dL (ref 13.0–17.0)
MCV: 91.4 fL (ref 78.0–100.0)
Platelets: 190 10*3/uL (ref 150–400)
RBC: 4.2 MIL/uL — ABNORMAL LOW (ref 4.22–5.81)
WBC: 9.9 10*3/uL (ref 4.0–10.5)

## 2012-05-13 LAB — COMPREHENSIVE METABOLIC PANEL
AST: 56 U/L — ABNORMAL HIGH (ref 0–37)
CO2: 21 mEq/L (ref 19–32)
Chloride: 105 mEq/L (ref 96–112)
Creatinine, Ser: 0.85 mg/dL (ref 0.50–1.35)
GFR calc Af Amer: >90 mL/min (ref >90)
GFR calc non Af Amer: >90 mL/min (ref >90)
Glucose, Bld: 98 mg/dL (ref 70–99)
Total Bilirubin: 0.4 mg/dL (ref 0.3–1.2)

## 2012-05-13 LAB — VITAMIN B1: Vitamin B1 (Thiamine): 12 nmol/L (ref 8–30)

## 2012-05-13 MED ORDER — HYDROMORPHONE HCL PF 1 MG/ML IJ SOLN
1.0000 mg | INTRAMUSCULAR | Status: DC | PRN
  Administered 2012-05-13 – 2012-05-15 (×8): 1 mg via INTRAVENOUS
  Filled 2012-05-13 (×8): qty 1

## 2012-05-13 MED ORDER — PIPERACILLIN-TAZOBACTAM 3.375 G IVPB
3.3750 g | Freq: Three times a day (TID) | INTRAVENOUS | Status: DC
  Administered 2012-05-13 – 2012-05-16 (×9): 3.375 g via INTRAVENOUS
  Filled 2012-05-13 (×11): qty 50

## 2012-05-13 NOTE — Progress Notes (Signed)
ANTIBIOTIC CONSULT NOTE - F/U  Pharmacy Consult for Vancomycin and Zosyn  Indication: ? Meningitis  No Known Allergies  Patient Measurements: Height: 6\' 4"  (193 cm) Weight: 255 lb 11.7 oz (116 kg) IBW/kg (Calculated) : 86.8    Vital Signs: Temp: 98.7 F (37.1 C) (08/10 0500) Temp src: Oral (08/10 0500) BP: 189/83 mmHg (08/10 0500) Pulse Rate: 108  (08/10 0500) Intake/Output from previous day: 08/09 0701 - 08/10 0700 In: 1440 [P.O.:240; IV Piggyback:1200] Out: 2225 [Urine:2225] Intake/Output from this shift: Total I/O In: 5390 [I.V.:5140; IV Piggyback:250] Out: -   Labs:  Basename 05/13/12 0612 05/12/12 0513  WBC 9.9 11.5*  HGB 13.1 12.2*  PLT 190 187  LABCREA -- --  CREATININE 0.85 0.95   Estimated Creatinine Clearance: 140 ml/min (by C-G formula based on Cr of 0.85).  Basename 05/12/12 1511  VANCOTROUGH 18.1  VANCOPEAK --  VANCORANDOM --  GENTTROUGH --  GENTPEAK --  GENTRANDOM --  TOBRATROUGH --  TOBRAPEAK --  TOBRARND --  AMIKACINPEAK --  AMIKACINTROU --  AMIKACIN --     Microbiology: Recent Results (from the past 720 hour(s))  CULTURE, BLOOD (ROUTINE X 2)     Status: Normal   Collection Time   05/07/12  5:45 PM      Component Value Range Status Comment   Specimen Description BLOOD LEFT ARM   Final    Special Requests BOTTLES DRAWN AEROBIC AND ANAEROBIC 4CC   Final    Culture NO GROWTH 5 DAYS   Final    Report Status 05/12/2012 FINAL   Final   URINE CULTURE     Status: Normal   Collection Time   05/07/12  5:55 PM      Component Value Range Status Comment   Specimen Description URINE, CATHETERIZED   Final    Special Requests NONE   Final    Culture  Setup Time 05/07/2012 21:53   Final    Colony Count NO GROWTH   Final    Culture NO GROWTH   Final    Report Status 05/09/2012 FINAL   Final   CULTURE, BLOOD (ROUTINE X 2)     Status: Normal   Collection Time   05/07/12  6:00 PM      Component Value Range Status Comment   Specimen Description BLOOD  RIGHT ARM   Final    Special Requests BOTTLES DRAWN AEROBIC AND ANAEROBIC 6CC   Final    Culture NO GROWTH 5 DAYS   Final    Report Status 05/12/2012 FINAL   Final   CULTURE, BLOOD (ROUTINE X 2)     Status: Normal (Preliminary result)   Collection Time   05/10/12  5:05 PM      Component Value Range Status Comment   Specimen Description BLOOD LEFT HAND   Final    Special Requests BOTTLES DRAWN AEROBIC AND ANAEROBIC 12CC   Final    Culture NO GROWTH 2 DAYS   Final    Report Status PENDING   Incomplete   CULTURE, BLOOD (ROUTINE X 2)     Status: Normal (Preliminary result)   Collection Time   05/10/12  5:10 PM      Component Value Range Status Comment   Specimen Description BLOOD RIGHT ANTECUBITAL   Final    Special Requests BOTTLES DRAWN AEROBIC AND ANAEROBIC 12CC   Final    Culture NO GROWTH 2 DAYS   Final    Report Status PENDING   Incomplete  Medical History: Past Medical History  Diagnosis Date  . Multiple sclerosis     Medications:  Scheduled:     . aspirin EC  81 mg Oral Daily  . carbamazepine  200 mg Oral TID  . cholecalciferol  2,000 Units Oral BH-q7a  . docusate sodium  100 mg Oral BID  . gabapentin  600 mg Oral TID  . glatiramer  20 mg Subcutaneous Daily  . insulin aspart  0-15 Units Subcutaneous TID WC  . insulin aspart  0-5 Units Subcutaneous QHS  . losartan  100 mg Oral Daily  . pantoprazole  40 mg Oral BID AC  . piperacillin-tazobactam (ZOSYN)  IV  3.375 g Intravenous Q8H  . sodium chloride  3 mL Intravenous Q12H  . vancomycin  1,250 mg Intravenous Q8H  . DISCONTD: ampicillin (OMNIPEN) IV  2 g Intravenous Q6H  . DISCONTD: cefTRIAXone (ROCEPHIN)  IV  2 g Intravenous Q12H  . DISCONTD: methylPREDNISolone (SOLU-MEDROL) injection  1,000 mg Intravenous Q24H   Assessment: Pt was on Vanc and Rocephin for fevers and night sweats of unknown etiology. Rocephin now d/c and zosyn initiated per pharmacy consult. Pt is afebrile today, WBC down to 9.9.   Estimated  Creatinine Clearance: 140 ml/min (by C-G formula based on Cr of 0.85).  Goal of Therapy:  Vancomycin trough level 15-20 mcg/ml  Plan:  1) Continue Vancomycin 1500mg  x 1, then 1250mg  IV every 8 hours. 2) Initiate Zosyn 3.375g q8h  3) Follow up Cx results  4) Monitor Renal function   Faraz Ponciano 05/13/2012,10:31 AM

## 2012-05-13 NOTE — Consult Note (Signed)
Reason for Consult:mediastinal adenopathy and FUO Referring Physician: Dr. Penny Pia is an 51 y.o. male.  HPI: 51 yo with history of multiple sclerosis. Admitted earlier in week with high fevers and profound LE weakness. At baseline prior to acute illness he was able to ambulate with a walker. On day of admission he could not move legs and upper body was severely weakened as well. He also was having fevers up to 103. W/u including MRi of brain and spine showed no evidence of infection. Cultures have been negative. A CT of chest done yesterday showed mediastinal and hilar adenopathy. Lungs were clear. Patient states that since admission he has regained some of his strength altough not quite back to baseline. He has continued to spike fevers.  Past Medical History  Diagnosis Date  . Multiple sclerosis     History reviewed. No pertinent past surgical history.  Family History  Problem Relation Age of Onset  . Heart failure Mother   . Stroke Mother   . Heart failure Father   . Cancer Sister   . Seizures Brother     Social History:  reports that he has been smoking Cigarettes.  He has been smoking about .5 packs per day. He does not have any smokeless tobacco history on file. He reports that he drinks alcohol. He reports that he uses illicit drugs (Cocaine and Marijuana).  Allergies: No Known Allergies  Medications:  Prior to Admission:  Prescriptions prior to admission  Medication Sig Dispense Refill  . carbamazepine (TEGRETOL) 200 MG tablet Take 200 mg by mouth 3 (three) times daily.      . Cholecalciferol (VITAMIN D) 2000 UNITS tablet Take 2,000 Units by mouth every morning.      . gabapentin (NEURONTIN) 300 MG capsule Take 300 mg by mouth 3 (three) times daily.      Marland Kitchen glatiramer (COPAXONE) 20 MG/ML injection Inject 20 mg into the skin every morning.      Marland Kitchen HYDROcodone-acetaminophen (VICODIN ES) 7.5-750 MG per tablet Take 1 tablet by mouth every 6 (six) hours as needed.  For pain      . losartan (COZAAR) 100 MG tablet Take 100 mg by mouth every morning.       . ondansetron (ZOFRAN) 4 MG tablet Take 1 tablet (4 mg total) by mouth every 6 (six) hours.  12 tablet  0    Results for orders placed during the hospital encounter of 05/07/12 (from the past 48 hour(s))  CBC     Status: Abnormal   Collection Time   05/12/12  5:13 AM      Component Value Range Comment   WBC 11.5 (*) 4.0 - 10.5 K/uL    RBC 3.81 (*) 4.22 - 5.81 MIL/uL    Hemoglobin 12.2 (*) 13.0 - 17.0 g/dL    HCT 40.9 (*) 81.1 - 52.0 %    MCV 91.6  78.0 - 100.0 fL    MCH 32.0  26.0 - 34.0 pg    MCHC 35.0  30.0 - 36.0 g/dL    RDW 91.4  78.2 - 95.6 %    Platelets 187  150 - 400 K/uL   SEDIMENTATION RATE     Status: Abnormal   Collection Time   05/12/12  5:13 AM      Component Value Range Comment   Sed Rate 42 (*) 0 - 16 mm/hr   COMPREHENSIVE METABOLIC PANEL     Status: Abnormal   Collection Time   05/12/12  5:13 AM      Component Value Range Comment   Sodium 138  135 - 145 mEq/L    Potassium 3.9  3.5 - 5.1 mEq/L    Chloride 103  96 - 112 mEq/L    CO2 24  19 - 32 mEq/L    Glucose, Bld 324 (*) 70 - 99 mg/dL    BUN 19  6 - 23 mg/dL    Creatinine, Ser 1.47  0.50 - 1.35 mg/dL    Calcium 8.8  8.4 - 82.9 mg/dL    Total Protein 6.3  6.0 - 8.3 g/dL    Albumin 2.8 (*) 3.5 - 5.2 g/dL    AST 53 (*) 0 - 37 U/L    ALT 229 (*) 0 - 53 U/L    Alkaline Phosphatase 92  39 - 117 U/L    Total Bilirubin 0.3  0.3 - 1.2 mg/dL    GFR calc non Af Amer >90  >90 mL/min    GFR calc Af Amer >90  >90 mL/min   VANCOMYCIN, TROUGH     Status: Normal   Collection Time   05/12/12  3:11 PM      Component Value Range Comment   Vancomycin Tr 18.1  10.0 - 20.0 ug/mL   HIV ANTIBODY (ROUTINE TESTING)     Status: Normal   Collection Time   05/12/12  3:11 PM      Component Value Range Comment   HIV NON REACTIVE  NON REACTIVE   HIV ANTIBODY (ROUTINE TESTING)     Status: Normal   Collection Time   05/12/12  5:12 PM      Component  Value Range Comment   HIV NON REACTIVE  NON REACTIVE   HEMOGLOBIN A1C     Status: Abnormal   Collection Time   05/12/12  5:12 PM      Component Value Range Comment   Hemoglobin A1C 6.3 (*) <5.7 %    Mean Plasma Glucose 134 (*) <117 mg/dL   GLUCOSE, CAPILLARY     Status: Abnormal   Collection Time   05/13/12  2:17 AM      Component Value Range Comment   Glucose-Capillary 128 (*) 70 - 99 mg/dL    Comment 1 Notify RN      Comment 2 Documented in Chart     CBC     Status: Abnormal   Collection Time   05/13/12  6:12 AM      Component Value Range Comment   WBC 9.9  4.0 - 10.5 K/uL    RBC 4.20 (*) 4.22 - 5.81 MIL/uL    Hemoglobin 13.1  13.0 - 17.0 g/dL    HCT 56.2 (*) 13.0 - 52.0 %    MCV 91.4  78.0 - 100.0 fL    MCH 31.2  26.0 - 34.0 pg    MCHC 34.1  30.0 - 36.0 g/dL    RDW 86.5  78.4 - 69.6 %    Platelets 190  150 - 400 K/uL   COMPREHENSIVE METABOLIC PANEL     Status: Abnormal   Collection Time   05/13/12  6:12 AM      Component Value Range Comment   Sodium 140  135 - 145 mEq/L    Potassium 4.2  3.5 - 5.1 mEq/L    Chloride 105  96 - 112 mEq/L    CO2 21  19 - 32 mEq/L    Glucose, Bld 98  70 - 99 mg/dL  BUN 16  6 - 23 mg/dL    Creatinine, Ser 1.61  0.50 - 1.35 mg/dL    Calcium 8.7  8.4 - 09.6 mg/dL    Total Protein 6.4  6.0 - 8.3 g/dL    Albumin 2.8 (*) 3.5 - 5.2 g/dL    AST 56 (*) 0 - 37 U/L    ALT 203 (*) 0 - 53 U/L    Alkaline Phosphatase 84  39 - 117 U/L    Total Bilirubin 0.4  0.3 - 1.2 mg/dL    GFR calc non Af Amer >90  >90 mL/min    GFR calc Af Amer >90  >90 mL/min   GLUCOSE, CAPILLARY     Status: Abnormal   Collection Time   05/13/12  8:00 AM      Component Value Range Comment   Glucose-Capillary 110 (*) 70 - 99 mg/dL   GLUCOSE, CAPILLARY     Status: Normal   Collection Time   05/13/12 11:53 AM      Component Value Range Comment   Glucose-Capillary 97  70 - 99 mg/dL     Ct Chest W Contrast  05/12/2012  *RADIOLOGY REPORT*  Clinical Data:  Fever unknown origin.   Chronic back pain.  Multiple sclerosis.  Weakness.  CT CHEST, ABDOMEN AND PELVIS WITH CONTRAST  Technique:  Multidetector CT imaging of the chest, abdomen and pelvis was performed following the standard protocol during bolus administration of intravenous contrast.  Contrast: 1 OMNIPAQUE IOHEXOL 300 MG/ML  SOLN (100 ml)  Comparison:  Multiple exams, including 05/07/2012  CT CHEST  Findings:  Right paratracheal node measures 1.3 cm in short axis, image 17 of series 3.  An AP window lymph node measures 1.6 cm in short axis, image 24 of series 3.  Right hilar adenopathy noted with one right hilar node measuring 1.5 cm in short axis, image 32 of series 3.  Left hilar adenopathy noted with several small calcifications, and also scattered infrahilar nodes.  Conglomerate subcarinal adenopathy measures 2.2 cm in short axis, image 30 of series 3.  Scattered small paraesophageal lymph nodes are present in the lower thorax.  Heart size is within normal limits.  A small hiatal hernia is present.  A 4 mm densely calcified right upper lobe pulmonary nodule on image 14 of series 4 is compatible with calcified granuloma.  Centrilobular emphysema noted.  There is mild atelectasis or scarring in the posterior basal segments of both lower lobes. Several additional small calcified right upper lobe granulomas are present.  There are several small calcified granulomas in the left lung.  IMPRESSION:  1.  Pathologic mediastinal and hilar adenopathy.  No airway thickening or pleural nodularity to suggest pulmonary parenchymal findings of sarcoidosis.  The appearance could reflect lymphoma. Tissue diagnosis is likely warranted. 2.  Small hiatal hernia. 3.  Old granulomatous disease.  CT ABDOMEN AND PELVIS  Findings:  Diffuse mild low density in the liver suggests diffuse mild hepatic steatosis.  The spleen, adrenal glands, and pancreas appear unremarkable.  The gallbladder and biliary system appear unremarkable.  Small peripancreatic lymph  nodes do not appear pathologically enlarged by size criteria.  The right kidney appears normal.  Several small hypodense lesions in the left kidney are technically too small to characterize although statistically likely represent cysts.  The renal calculi referenced on the prior ultrasound examination from 07/06/2001 are not observed on today's exam. No pathologic pelvic adenopathy is identified.  The appendix is within normal limits.  No  significant colonic diverticular disease observed.  No dilated bowel noted.  Urinary bladder appears unremarkable.  Degenerative grade 1 anterolisthesis of L5 on S1 is noted.  IMPRESSION:  1.  The suspected diffuse mild hepatic steatosis. 2.  Several small hypodense lesions in the left kidney are likely cyst but technically too small to characterize. 3.  Degenerative grade 1 anterolisthesis of L5 on S1.  Original Report Authenticated By: Dellia Cloud, M.D.   Ct Abdomen Pelvis W Contrast  05/12/2012  *RADIOLOGY REPORT*  Clinical Data:  Fever unknown origin.  Chronic back pain.  Multiple sclerosis.  Weakness.  CT CHEST, ABDOMEN AND PELVIS WITH CONTRAST  Technique:  Multidetector CT imaging of the chest, abdomen and pelvis was performed following the standard protocol during bolus administration of intravenous contrast.  Contrast: 1 OMNIPAQUE IOHEXOL 300 MG/ML  SOLN (100 ml)  Comparison:  Multiple exams, including 05/07/2012  CT CHEST  Findings:  Right paratracheal node measures 1.3 cm in short axis, image 17 of series 3.  An AP window lymph node measures 1.6 cm in short axis, image 24 of series 3.  Right hilar adenopathy noted with one right hilar node measuring 1.5 cm in short axis, image 32 of series 3.  Left hilar adenopathy noted with several small calcifications, and also scattered infrahilar nodes.  Conglomerate subcarinal adenopathy measures 2.2 cm in short axis, image 30 of series 3.  Scattered small paraesophageal lymph nodes are present in the lower thorax.  Heart  size is within normal limits.  A small hiatal hernia is present.  A 4 mm densely calcified right upper lobe pulmonary nodule on image 14 of series 4 is compatible with calcified granuloma.  Centrilobular emphysema noted.  There is mild atelectasis or scarring in the posterior basal segments of both lower lobes. Several additional small calcified right upper lobe granulomas are present.  There are several small calcified granulomas in the left lung.  IMPRESSION:  1.  Pathologic mediastinal and hilar adenopathy.  No airway thickening or pleural nodularity to suggest pulmonary parenchymal findings of sarcoidosis.  The appearance could reflect lymphoma. Tissue diagnosis is likely warranted. 2.  Small hiatal hernia. 3.  Old granulomatous disease.  CT ABDOMEN AND PELVIS  Findings:  Diffuse mild low density in the liver suggests diffuse mild hepatic steatosis.  The spleen, adrenal glands, and pancreas appear unremarkable.  The gallbladder and biliary system appear unremarkable.  Small peripancreatic lymph nodes do not appear pathologically enlarged by size criteria.  The right kidney appears normal.  Several small hypodense lesions in the left kidney are technically too small to characterize although statistically likely represent cysts.  The renal calculi referenced on the prior ultrasound examination from 07/06/2001 are not observed on today's exam. No pathologic pelvic adenopathy is identified.  The appendix is within normal limits.  No significant colonic diverticular disease observed.  No dilated bowel noted.  Urinary bladder appears unremarkable.  Degenerative grade 1 anterolisthesis of L5 on S1 is noted.  IMPRESSION:  1.  The suspected diffuse mild hepatic steatosis. 2.  Several small hypodense lesions in the left kidney are likely cyst but technically too small to characterize. 3.  Degenerative grade 1 anterolisthesis of L5 on S1.  Original Report Authenticated By: Dellia Cloud, M.D.    Review of Systems   Constitutional: Positive for fever, chills and malaise/fatigue.  Eyes: Negative.   Respiratory: Negative.   Cardiovascular: Negative.   Musculoskeletal: Positive for joint pain.  Neurological: Positive for weakness and headaches.  MS Headache Profound leg weakness   Blood pressure 117/71, pulse 76, temperature 97.5 F (36.4 C), temperature source Oral, resp. rate 18, height 6\' 4"  (1.93 m), weight 255 lb 11.7 oz (116 kg), SpO2 96.00%. Physical Exam  Vitals reviewed. Constitutional: He is oriented to person, place, and time. He appears well-nourished. No distress.  HENT:  Head: Normocephalic and atraumatic.  Eyes: EOM are normal. Pupils are equal, round, and reactive to light.  Neck: Neck supple.  Cardiovascular: Normal rate, regular rhythm, normal heart sounds and intact distal pulses.   No murmur heard. Respiratory: Breath sounds normal. He has no wheezes. He has no rales.  GI: Soft. There is no tenderness.  Musculoskeletal: He exhibits edema.  Lymphadenopathy:    He has no cervical adenopathy.  Neurological: He is alert and oriented to person, place, and time.       Bilateral leg weakness 4/5 dorsi and plantar flexion  Skin: Skin is warm and dry.    CT CHEST  Findings: Right paratracheal node measures 1.3 cm in short axis,  image 17 of series 3. An AP window lymph node measures 1.6 cm in  short axis, image 24 of series 3. Right hilar adenopathy noted  with one right hilar node measuring 1.5 cm in short axis, image 32  of series 3. Left hilar adenopathy noted with several small  calcifications, and also scattered infrahilar nodes. Conglomerate  subcarinal adenopathy measures 2.2 cm in short axis, image 30 of  series 3. Scattered small paraesophageal lymph nodes are present  in the lower thorax.  Heart size is within normal limits. A small hiatal hernia is  present.  A 4 mm densely calcified right upper lobe pulmonary nodule on image  14 of series 4 is compatible  with calcified granuloma.  Centrilobular emphysema noted. There is mild atelectasis or  scarring in the posterior basal segments of both lower lobes.  Several additional small calcified right upper lobe granulomas are  present. There are several small calcified granulomas in the left  lung.  IMPRESSION:  1. Pathologic mediastinal and hilar adenopathy. No airway  thickening or pleural nodularity to suggest pulmonary parenchymal  findings of sarcoidosis. The appearance could reflect lymphoma.  Tissue diagnosis is likely warranted.  2. Small hiatal hernia.  3. Old granulomatous disease.  CT ABDOMEN AND PELVIS  Findings: Diffuse mild low density in the liver suggests diffuse  mild hepatic steatosis. The spleen, adrenal glands, and pancreas  appear unremarkable.  The gallbladder and biliary system appear unremarkable.  Small peripancreatic lymph nodes do not appear pathologically  enlarged by size criteria.  The right kidney appears normal. Several small hypodense lesions  in the left kidney are technically too small to characterize  although statistically likely represent cysts. The renal calculi  referenced on the prior ultrasound examination from 07/06/2001 are  not observed on today's exam. No pathologic pelvic adenopathy is  identified.  The appendix is within normal limits. No significant colonic  diverticular disease observed. No dilated bowel noted.  Urinary bladder appears unremarkable. Degenerative grade 1  anterolisthesis of L5 on S1 is noted.  IMPRESSION:  1. The suspected diffuse mild hepatic steatosis.  2. Several small hypodense lesions in the left kidney are likely  cyst but technically too small to characterize.  3. Degenerative grade 1 anterolisthesis of L5 on S1.     Assessment/Plan: 51 yo with multiple sclerosis who presents with high fevers of unknown origin and worsening of his baseline LE weakness. Source of fevers  is unknown. Workup has not revealed a  definitive source of the fevers but has identified enlarged mediastinal lymph nodes raising the suspicion of lymphoma as a possible source.  He needs biopsy of the mediastinal nodes. Although EBUS is technically feasible, it is unlikely to provide a diagnosis in this case. In my opinion the best option is to proceed with mediastinoscopy to allow [place] to ensure we have adequate tissue to confirm or deny the possibility of lymphoma.  I have discussed with the patient and his wife the general nature of the procedure, need for general anesthesia, and incision to be used. I have discussed the expected hospital stay, overall recovery and short and long term outcomes. They understand the risks include but are not limited to death, stroke, MI, DVT/PE, bleeding, possible need for transfusion, infections, recurrent nerve injury, pneumothorax, esophageal injury. They do understand there is no guarantee that the procedure will provide a definitive diagnosis. He accepts the risks and wishes to proceed.  Will plan mediastinoscopy Monday 8/12.  Lolah Coghlan C 05/13/2012, 4:54 PM

## 2012-05-13 NOTE — Progress Notes (Signed)
Report called to Chad Vasquez at [facility] on 5500. Patient transferred to [facility] via [facility] in stable condition.

## 2012-05-13 NOTE — Progress Notes (Signed)
[facility] PROGRESS NOTE  Chad Vasquez NWG:[id number] DOB: 09/27/1961 DOA: 05/07/2012   Assessment/Plan: Patient Active Hospital Problem List: Multiple sclerosis exacerbation (05/07/2012) -currently stable has completed a five-day course of Solu-Medrol. We'll do physical therapy to see him.  Fever (05/07/2012) -quite concerning that he is having night sweats but high fevers has been on empiric treatment with vancomycin Rocephin and he continues to spike fevers. A CT scan of the chest concerning for lymphadenopathy, HIV negative. -consult cardiothoracic for biopsy of the lymphadenopathy. -Blood cultures x2 continue to be negative CSF cultures are negative urine cultures are negative, TSH is within normal limits the ESR continues to be high. -ibuprofen for fever.  HTN (hypertension) (05/07/2012) -blood pressure borderline high. Resume her medication. This was not recently started.  Chronic pain (05/07/2012) -continue narcotics and ibuprofen. Get physical therapy. -start IV narcotics.  Polysubstance abuse (05/07/2012) -counseling.   Code Status: Full code Family Communication: [phone] Disposition Plan: To determine     LOS: 6 days   Procedures:    Antibiotics:  Vancomycin and zosyn   Subjective: Patient relates he still has lower extremity pain.  Objective: Filed Vitals:   05/12/12 1631 05/12/12 2058 05/13/12 0500 05/13/12 0800  BP: 167/84 157/81 189/83   Pulse:  91 108   Temp:  99.9 F (37.7 C) 98.7 F (37.1 C)   TempSrc:  Oral Oral   Resp:  20 18   Height:      Weight:    116 kg (255 lb 11.7 oz)  SpO2:  93% 94%     Intake/Output Summary (Last 24 hours) at 05/13/12 1023 Last data filed at 05/13/12 0826  Gross per 24 hour  Intake   6830 ml  Output   1400 ml  Net   5430 ml   Weight change:   Exam:  General: Alert, awake, oriented x3, in no acute distress.  HEENT: No bruits, no goiter.  Heart: Regular rate and rhythm, without murmurs, rubs,  gallops.  Lungs: Good air movement, bilateral air movement.  Abdomen: Soft, nontender, nondistended, positive bowel sounds.  Neuro: Grossly intact, nonfocal. No hyperreflexia the   Data Reviewed: Basic Metabolic Panel:  Lab 05/13/12 2841 05/12/12 0513 05/07/12 1800 05/07/12 0542  NA 140 138 135 138  K 4.2 3.9 -- --  CL 105 103 101 102  CO2 21 24 24 25   GLUCOSE 98 324* 117* 114*  BUN 16 19 13 14   CREATININE 0.85 0.95 1.04 1.08  CALCIUM 8.7 8.8 9.2 9.6  MG -- -- -- --  PHOS -- -- -- --   Liver Function Tests:  Lab 05/13/12 0612 05/12/12 0513 05/07/12 0542  AST 56* 53* 18  ALT 203* 229* 37  ALKPHOS 84 92 69  BILITOT 0.4 0.3 0.2*  PROT 6.4 6.3 6.9  ALBUMIN 2.8* 2.8* 3.8   No results found for this basename: LIPASE:5,AMYLASE:5 in the last 168 hours No results found for this basename: AMMONIA:5 in the last 168 hours CBC:  Lab 05/13/12 0612 05/12/12 0513 05/07/12 1800 05/07/12 0542  WBC 9.9 11.5* 11.3* 12.6*  NEUTROABS -- -- 9.7* 10.3*  HGB 13.1 12.2* 13.5 13.4  HCT 38.4* 34.9* 39.6 39.1  MCV 91.4 91.6 93.4 93.1  PLT 190 187 159 180   Cardiac Enzymes: No results found for this basename: CKTOTAL:5,CKMB:5,CKMBINDEX:5,TROPONINI:5 in the last 168 hours BNP: No components found with this basename: POCBNP:5 CBG:  Lab 05/13/12 0800 05/13/12 0217  GLUCAP 110* 128*    Recent Results (from the past  240 hour(s))  CULTURE, BLOOD (ROUTINE X 2)     Status: Normal   Collection Time   05/07/12  5:45 PM      Component Value Range Status Comment   Specimen Description BLOOD LEFT ARM   Final    Special Requests BOTTLES DRAWN AEROBIC AND ANAEROBIC 4CC   Final    Culture NO GROWTH 5 DAYS   Final    Report Status 05/12/2012 FINAL   Final   URINE CULTURE     Status: Normal   Collection Time   05/07/12  5:55 PM      Component Value Range Status Comment   Specimen Description URINE, CATHETERIZED   Final    Special Requests NONE   Final    Culture  Setup Time 05/07/2012 21:53   Final      Colony Count NO GROWTH   Final    Culture NO GROWTH   Final    Report Status 05/09/2012 FINAL   Final   CULTURE, BLOOD (ROUTINE X 2)     Status: Normal   Collection Time   05/07/12  6:00 PM      Component Value Range Status Comment   Specimen Description BLOOD RIGHT ARM   Final    Special Requests BOTTLES DRAWN AEROBIC AND ANAEROBIC 6CC   Final    Culture NO GROWTH 5 DAYS   Final    Report Status 05/12/2012 FINAL   Final   CULTURE, BLOOD (ROUTINE X 2)     Status: Normal (Preliminary result)   Collection Time   05/10/12  5:05 PM      Component Value Range Status Comment   Specimen Description BLOOD LEFT HAND   Final    Special Requests BOTTLES DRAWN AEROBIC AND ANAEROBIC 12CC   Final    Culture NO GROWTH 2 DAYS   Final    Report Status PENDING   Incomplete   CULTURE, BLOOD (ROUTINE X 2)     Status: Normal (Preliminary result)   Collection Time   05/10/12  5:10 PM      Component Value Range Status Comment   Specimen Description BLOOD RIGHT ANTECUBITAL   Final    Special Requests BOTTLES DRAWN AEROBIC AND ANAEROBIC 12CC   Final    Culture NO GROWTH 2 DAYS   Final    Report Status PENDING   Incomplete      Studies: Ct Chest W Contrast  05/12/2012  *RADIOLOGY REPORT*  Clinical Data:  Fever unknown origin.  Chronic back pain.  Multiple sclerosis.  Weakness.  CT CHEST, ABDOMEN AND PELVIS WITH CONTRAST  Technique:  Multidetector CT imaging of the chest, abdomen and pelvis was performed following the standard protocol during bolus administration of intravenous contrast.  Contrast: 1 OMNIPAQUE IOHEXOL 300 MG/ML  SOLN (100 ml)  Comparison:  Multiple exams, including 05/07/2012  CT CHEST  Findings:  Right paratracheal node measures 1.3 cm in short axis, image 17 of series 3.  An AP window lymph node measures 1.6 cm in short axis, image 24 of series 3.  Right hilar adenopathy noted with one right hilar node measuring 1.5 cm in short axis, image 32 of series 3.  Left hilar adenopathy noted with several  small calcifications, and also scattered infrahilar nodes.  Conglomerate subcarinal adenopathy measures 2.2 cm in short axis, image 30 of series 3.  Scattered small paraesophageal lymph nodes are present in the lower thorax.  Heart size is within normal limits.  A small hiatal hernia is  present.  A 4 mm densely calcified right upper lobe pulmonary nodule on image 14 of series 4 is compatible with calcified granuloma.  Centrilobular emphysema noted.  There is mild atelectasis or scarring in the posterior basal segments of both lower lobes. Several additional small calcified right upper lobe granulomas are present.  There are several small calcified granulomas in the left lung.  IMPRESSION:  1.  Pathologic mediastinal and hilar adenopathy.  No airway thickening or pleural nodularity to suggest pulmonary parenchymal findings of sarcoidosis.  The appearance could reflect lymphoma. Tissue diagnosis is likely warranted. 2.  Small hiatal hernia. 3.  Old granulomatous disease.  CT ABDOMEN AND PELVIS  Findings:  Diffuse mild low density in the liver suggests diffuse mild hepatic steatosis.  The spleen, adrenal glands, and pancreas appear unremarkable.  The gallbladder and biliary system appear unremarkable.  Small peripancreatic lymph nodes do not appear pathologically enlarged by size criteria.  The right kidney appears normal.  Several small hypodense lesions in the left kidney are technically too small to characterize although statistically likely represent cysts.  The renal calculi referenced on the prior ultrasound examination from 07/06/2001 are not observed on today's exam. No pathologic pelvic adenopathy is identified.  The appendix is within normal limits.  No significant colonic diverticular disease observed.  No dilated bowel noted.  Urinary bladder appears unremarkable.  Degenerative grade 1 anterolisthesis of L5 on S1 is noted.  IMPRESSION:  1.  The suspected diffuse mild hepatic steatosis. 2.  Several small  hypodense lesions in the left kidney are likely cyst but technically too small to characterize. 3.  Degenerative grade 1 anterolisthesis of L5 on S1.  Original Report Authenticated By: Dellia Cloud, M.D.   Mr Laqueta Jean Wo Contrast  05/09/2012  *RADIOLOGY REPORT*  Clinical Data:  51 year old male with lower extremity weakness. History of multiple sclerosis.  MRI HEAD WITHOUT AND WITH CONTRAST MRI CERVICAL SPINE WITHOUT AND WITH CONTRAST  Technique:  Multiplanar, multiecho pulse sequences of the brain and surrounding structures, and cervical spine, to include the craniocervical junction and cervicothoracic junction, were obtained without and with intravenous contrast.  Contrast: 20mL MULTIHANCE GADOBENATE DIMEGLUMINE 529 MG/ML IV SOLN  Comparison:  Thoracic spine study performed the previous day and reported separately.  Brain and cervical MRI 09/15/2010 and earlier.  MRI HEAD  Findings:  Normal cerebral volume. No restricted diffusion to suggest acute infarction.  No midline shift, mass effect, evidence of mass lesion, ventriculomegaly, extra-axial collection or acute intracranial hemorrhage.  Cervicomedullary junction and pituitary are within normal limits.  Major intracranial vascular flow voids are stable, dominant left vertebral artery.  Normal bone marrow signal.  No abnormal enhancement identified.  Chronically advanced T2 and FLAIR hyperintense foci throughout the brain are stable since 2011. Cerebral hemisphere lesions are oriented perpendicular to the lateral ventricles as before, typical in appearance for multiple sclerosis.  There is involvement of the brain stem as before. Cervical spine findings are below.  Orbit soft tissues are within normal limits.  Stable minor paranasal sinus mucosal thickening.  Mastoids remain clear. Negative scalp soft tissues.  IMPRESSION: 1.  Stable MRI appearance of chronic demyelinating disease of the brain.  No enhancement or evidence of acute demyelination. 2.   Cervical spine findings are below. 3.  No new intracranial abnormality.  MRI CERVICAL SPINE  Findings: Stable cervical vertebral height and alignment with preserved lordosis. No marrow edema or evidence of acute osseous abnormality.  Visualized paraspinal soft tissues are within normal limits.  Chronic T2 and STIR hyperintense right lateral spinal cord plaque at C3 is re-identified and not significantly changed.  Elsewhere the cervical spinal cord signal is within normal limits.  Upper thoracic spinal cord plaque is partially visible on the left at T2 and is also chronic.  No abnormal spinal cord or intradural enhancement to suggest acute demyelination.  Stable chronic predominately mild cervical spine degenerative changes.  There is minimal to mild spinal stenosis at C6-C7 with moderate to severe bilateral foraminal stenosis again noted, related to disc bulge and ligament flavum hypertrophy.  There is a mild chronic disc bulge at C3-C4 with chronic uncovertebral hypertrophy and mild to moderate to bilateral foraminal stenosis.  IMPRESSION: 1.  Stable chronic demyelinating lesions in the spinal cord at C3 and T2.  No new spinal cord signal abnormality.  No enhancement to suggest acute demyelination. 2.  Stable mild for age cervical degenerative changes.  Original Report Authenticated By: Harley Hallmark, M.D.   Mr Cervical Spine W Wo Contrast  05/09/2012  *RADIOLOGY REPORT*  Clinical Data:  51 year old male with lower extremity weakness. History of multiple sclerosis.  MRI HEAD WITHOUT AND WITH CONTRAST MRI CERVICAL SPINE WITHOUT AND WITH CONTRAST  Technique:  Multiplanar, multiecho pulse sequences of the brain and surrounding structures, and cervical spine, to include the craniocervical junction and cervicothoracic junction, were obtained without and with intravenous contrast.  Contrast: 20mL MULTIHANCE GADOBENATE DIMEGLUMINE 529 MG/ML IV SOLN  Comparison:  Thoracic spine study performed the previous day and  reported separately.  Brain and cervical MRI 09/15/2010 and earlier.  MRI HEAD  Findings:  Normal cerebral volume. No restricted diffusion to suggest acute infarction.  No midline shift, mass effect, evidence of mass lesion, ventriculomegaly, extra-axial collection or acute intracranial hemorrhage.  Cervicomedullary junction and pituitary are within normal limits.  Major intracranial vascular flow voids are stable, dominant left vertebral artery.  Normal bone marrow signal.  No abnormal enhancement identified.  Chronically advanced T2 and FLAIR hyperintense foci throughout the brain are stable since 2011. Cerebral hemisphere lesions are oriented perpendicular to the lateral ventricles as before, typical in appearance for multiple sclerosis.  There is involvement of the brain stem as before. Cervical spine findings are below.  Orbit soft tissues are within normal limits.  Stable minor paranasal sinus mucosal thickening.  Mastoids remain clear. Negative scalp soft tissues.  IMPRESSION: 1.  Stable MRI appearance of chronic demyelinating disease of the brain.  No enhancement or evidence of acute demyelination. 2.  Cervical spine findings are below. 3.  No new intracranial abnormality.  MRI CERVICAL SPINE  Findings: Stable cervical vertebral height and alignment with preserved lordosis. No marrow edema or evidence of acute osseous abnormality.  Visualized paraspinal soft tissues are within normal limits.  Chronic T2 and STIR hyperintense right lateral spinal cord plaque at C3 is re-identified and not significantly changed.  Elsewhere the cervical spinal cord signal is within normal limits.  Upper thoracic spinal cord plaque is partially visible on the left at T2 and is also chronic.  No abnormal spinal cord or intradural enhancement to suggest acute demyelination.  Stable chronic predominately mild cervical spine degenerative changes.  There is minimal to mild spinal stenosis at C6-C7 with moderate to severe bilateral  foraminal stenosis again noted, related to disc bulge and ligament flavum hypertrophy.  There is a mild chronic disc bulge at C3-C4 with chronic uncovertebral hypertrophy and mild to moderate to bilateral foraminal stenosis.  IMPRESSION: 1.  Stable chronic demyelinating lesions in  the spinal cord at C3 and T2.  No new spinal cord signal abnormality.  No enhancement to suggest acute demyelination. 2.  Stable mild for age cervical degenerative changes.  Original Report Authenticated By: Harley Hallmark, M.D.   Mr Thoracic Spine W Wo Contrast  05/08/2012  *RADIOLOGY REPORT*  Clinical Data: Lower extremity weakness.  Fever.  Multiple sclerosis.  MRI THORACIC SPINE WITHOUT AND WITH CONTRAST  Technique:  Multiplanar and multiecho pulse sequences of the thoracic spine were obtained without and with intravenous contrast.  Contrast: 20mL MULTIHANCE GADOBENATE DIMEGLUMINE 529 MG/ML IV SOLN  Comparison: Cervical MRI dated 09/15/2010  Findings: The patient has a subtle plaque in the left posterior lateral aspect of the thoracic spinal cord at T2, unchanged since the cervical MRI dated 09/15/2010.  There are also small focal plaques in the posterior columns at T10 and centrally in the spinal cord at T10-11.  The spinal cord is not enlarged.  There is no pathologic enhancement of the spinal cord or plaques after contrast administration.  The osseous structures of the thoracic spine are normal.  Discs are normal.  No foraminal or spinal stenosis.  The paraspinal soft tissues are normal.  IMPRESSION:  1.  Plaques in the thoracic spinal cord at T2, T10, and T11, most consistent with multiple sclerosis. 2.  No evidence of transverse myelitis or spinal abscess.  Original Report Authenticated By: Gwynn Burly, M.D.   Mr Lumbar Spine W Wo Contrast  05/08/2012  *RADIOLOGY REPORT*  Clinical Data: Lower extremity weakness.  Fever.  Multiple sclerosis.  MRI LUMBAR SPINE WITHOUT AND WITH CONTRAST  Technique:  Multiplanar and multiecho  pulse sequences of the lumbar spine were obtained without and with intravenous contrast.  Contrast: 20mL MULTIHANCE GADOBENATE DIMEGLUMINE 529 MG/ML IV SOLN  Comparison: MRI dated 02/25/2012  Findings: Scan extends from T11-12 through S2.  Tip of the conus is at L1-2 and appears normal.  T11-12:  Normal.  T12-L1:  Small disc bulge central and slightly to the right without neural impingement without change.  L1-2:  Small broad-based disc bulge with no neural impingement, unchanged.  L2-3:  Far lateral disc bulges to the right and left without neural impingement and without change.  L3-4:  Far lateral annular tears of the right and left without neural impingement, unchanged.  Moderate bilateral facet arthritis. Slight narrowing of the spinal canal, unchanged.  L4-5:  Moderate bilateral facet hypertrophy.  Small annular tear to the left.  No neural impingement.  L5 S1:  5 mm spondylolisthesis with slight bulging of the uncovered disc with slight narrowing of the neural foramina, right greater than left, without focal neural impingement, unchanged.  IMPRESSION:   Multilevel degenerative disc disease without focal neural impingement.  No change since the prior exam.  No significant enhancement after contrast administration.  Original Report Authenticated By: Gwynn Burly, M.D.   Ct Abdomen Pelvis W Contrast  05/12/2012  *RADIOLOGY REPORT*  Clinical Data:  Fever unknown origin.  Chronic back pain.  Multiple sclerosis.  Weakness.  CT CHEST, ABDOMEN AND PELVIS WITH CONTRAST  Technique:  Multidetector CT imaging of the chest, abdomen and pelvis was performed following the standard protocol during bolus administration of intravenous contrast.  Contrast: 1 OMNIPAQUE IOHEXOL 300 MG/ML  SOLN (100 ml)  Comparison:  Multiple exams, including 05/07/2012  CT CHEST  Findings:  Right paratracheal node measures 1.3 cm in short axis, image 17 of series 3.  An AP window lymph node measures 1.6 cm in  short axis, image 24 of series  3.  Right hilar adenopathy noted with one right hilar node measuring 1.5 cm in short axis, image 32 of series 3.  Left hilar adenopathy noted with several small calcifications, and also scattered infrahilar nodes.  Conglomerate subcarinal adenopathy measures 2.2 cm in short axis, image 30 of series 3.  Scattered small paraesophageal lymph nodes are present in the lower thorax.  Heart size is within normal limits.  A small hiatal hernia is present.  A 4 mm densely calcified right upper lobe pulmonary nodule on image 14 of series 4 is compatible with calcified granuloma.  Centrilobular emphysema noted.  There is mild atelectasis or scarring in the posterior basal segments of both lower lobes. Several additional small calcified right upper lobe granulomas are present.  There are several small calcified granulomas in the left lung.  IMPRESSION:  1.  Pathologic mediastinal and hilar adenopathy.  No airway thickening or pleural nodularity to suggest pulmonary parenchymal findings of sarcoidosis.  The appearance could reflect lymphoma. Tissue diagnosis is likely warranted. 2.  Small hiatal hernia. 3.  Old granulomatous disease.  CT ABDOMEN AND PELVIS  Findings:  Diffuse mild low density in the liver suggests diffuse mild hepatic steatosis.  The spleen, adrenal glands, and pancreas appear unremarkable.  The gallbladder and biliary system appear unremarkable.  Small peripancreatic lymph nodes do not appear pathologically enlarged by size criteria.  The right kidney appears normal.  Several small hypodense lesions in the left kidney are technically too small to characterize although statistically likely represent cysts.  The renal calculi referenced on the prior ultrasound examination from 07/06/2001 are not observed on today's exam. No pathologic pelvic adenopathy is identified.  The appendix is within normal limits.  No significant colonic diverticular disease observed.  No dilated bowel noted.  Urinary bladder appears  unremarkable.  Degenerative grade 1 anterolisthesis of L5 on S1 is noted.  IMPRESSION:  1.  The suspected diffuse mild hepatic steatosis. 2.  Several small hypodense lesions in the left kidney are likely cyst but technically too small to characterize. 3.  Degenerative grade 1 anterolisthesis of L5 on S1.  Original Report Authenticated By: Dellia Cloud, M.D.   US Venous Img Lower Bilateral  05/08/2012  *RADIOLOGY REPORT*  Clinical Data: Bilateral leg weakness and hip pain, history of smoking, evaluate for DVT  BILATERAL LOWER EXTREMITY VENOUS DUPLEX ULTRASOUND  Technique:  Gray-scale sonography with graded compression, as well as color Doppler and duplex ultrasound, were performed to evaluate the deep venous system of both lower extremities from the level of the common femoral vein through the popliteal and proximal calf veins.  Spectral Doppler was utilized to evaluate flow at rest and with distal augmentation maneuvers.  Comparison:  None.  Findings:  Normal compressibility of bilateral common femoral, superficial femoral, and popliteal veins is demonstrated, as well as the visualized proximal calf veins.  No filling defects to suggest DVT on grayscale or color Doppler imaging.  Doppler waveforms show normal direction of venous flow, normal respiratory phasicity and response to augmentation.  IMPRESSION: No evidence of deep vein thrombosis, within either lower extremity.  Original Report Authenticated By: Waynard Reeds, M.D.   Dg Chest Port 1 View  05/07/2012  *RADIOLOGY REPORT*  Clinical Data: Fever.  Infiltrate.  PORTABLE CHEST - 1 VIEW  Comparison: Multiple priors most recently 05/17/2012 at 1535 hours. Chest CT 01/13/2007.  Findings: Left costophrenic angle excluded from view.  Left basilar subsegmental atelectasis.  Prominent  right pericardial fat pad. Cardiopericardial silhouette is within normal limits.  No airspace disease.  No effusion.  Calcified granuloma in the right upper lobe and  punctate calcifications of the mediastinal lymph nodes again noted compatible with old granulomatous disease.  IMPRESSION: No acute cardiopulmonary disease.  Subsegmental left basilar atelectasis.  Original Report Authenticated By: Andreas Newport, M.D.   Dg Chest Port 1 View  05/07/2012  *RADIOLOGY REPORT*  Clinical Data: Weakness.  Fall.  Multiple sclerosis.  Smoker.  PORTABLE CHEST - 1 VIEW  Comparison: 06/14/2011 chest radiograph.  CT chest 01/13/2007  Findings: The normal heart size and pulmonary vascularity. Calcified granulomas in the mid lungs.  Nodular hilar opacities consistent with prominent lymph nodes and granulomas as seen on previous chest CT.  Mild emphysematous changes in the upper lungs. No focal airspace consolidation.  No blunting of costophrenic angles.  No pneumothorax.  Old left rib fracture.  IMPRESSION: Calcified granulomas with calcified hilar lymph nodes. Emphysematous changes.  No evidence of active pulmonary disease.  Original Report Authenticated By: Marlon Pel, M.D.   Dg Fluoro Guide Lumbar Puncture  05/11/2012  *RADIOLOGY REPORT*  Clinical Data: Multiple Sclerosis, fever and headaches.  DIAGNOSTIC LUMBAR PUNCTURE UNDER FLUOROSCOPIC GUIDANCE  Fluoroscopy time:  1.8. minutes.  Technique:  Informed consent was obtained from the patient prior to the procedure, including potential complications of headache, allergy, and pain. Lovenox was withheld for 1.5 days.  With the patient prone oblique, the lower back was prepped with Betadine. 1% Lidocaine was used for local anesthesia.  Lumbar puncture was performed at the L4-L5 level using a 12.7 cm 20  gauge needle with return of blood tinged CSF with an opening pressure of 19.2. cm water.   Only three ml of CSF were obtained for laboratory studies, even after placing the  patient in reverse Trendelenburg position. The patient tolerated the procedure well and there were no apparent complications.  IMPRESSION: Slightly elevated opening  pressure of 19.2 cm water.  Approximately 3 ml of blood-tinged CSF was obtained for pathological analysis.  Original Report Authenticated By: Brandon Melnick, M.D.    Scheduled Meds:    . aspirin EC  81 mg Oral Daily  . carbamazepine  200 mg Oral TID  . cholecalciferol  2,000 Units Oral [id number]  . docusate sodium  100 mg Oral BID  . gabapentin  600 mg Oral TID  . glatiramer  20 mg Subcutaneous Daily  . insulin aspart  0-15 Units Subcutaneous TID WC  . insulin aspart  0-5 Units Subcutaneous QHS  . losartan  100 mg Oral Daily  . pantoprazole  40 mg Oral BID AC  . sodium chloride  3 mL Intravenous Q12H  . vancomycin  1,250 mg Intravenous Q8H  . DISCONTD: ampicillin (OMNIPEN) IV  2 g Intravenous Q6H  . DISCONTD: cefTRIAXone (ROCEPHIN)  IV  2 g Intravenous Q12H  . DISCONTD: methylPREDNISolone (SOLU-MEDROL) injection  1,000 mg Intravenous Q24H   Continuous Infusions:    . sodium chloride 100 mL/hr at 05/11/12 2112    Lambert Keto, MD  [facility] Pager [phone]  If 7PM-7AM, please contact night-coverage [email or web address] Password [facility] 05/13/2012, 10:23 AM

## 2012-05-14 LAB — ABO/RH: ABO/RH(D): A NEG

## 2012-05-14 LAB — GLUCOSE, CAPILLARY
Glucose-Capillary: 102 mg/dL — ABNORMAL HIGH (ref 70–99)
Glucose-Capillary: 114 mg/dL — ABNORMAL HIGH (ref 70–99)
Glucose-Capillary: 101 mg/dL — ABNORMAL HIGH (ref 70–99)

## 2012-05-14 LAB — TYPE AND SCREEN
ABO/RH(D): A NEG
Antibody Screen: NEGATIVE

## 2012-05-14 MED ORDER — IBUPROFEN 600 MG PO TABS
600.0000 mg | ORAL_TABLET | Freq: Three times a day (TID) | ORAL | Status: DC
  Administered 2012-05-14 – 2012-05-19 (×16): 600 mg via ORAL
  Filled 2012-05-14 (×21): qty 1

## 2012-05-14 MED ORDER — VANCOMYCIN HCL 1000 MG IV SOLR
1250.0000 mg | Freq: Three times a day (TID) | INTRAVENOUS | Status: DC
  Administered 2012-05-14 – 2012-05-16 (×6): 1250 mg via INTRAVENOUS
  Filled 2012-05-14 (×8): qty 1250

## 2012-05-14 NOTE — Progress Notes (Signed)
  Subjective: Fevers again last PM  Objective: Vital signs in last 24 hours: Temp:  [97.5 F (36.4 C)-102.6 F (39.2 C)] 102 F (38.9 C) (08/11 0452) Pulse Rate:  [76-103] 99  (08/11 1025) Cardiac Rhythm:  [-] Normal sinus rhythm (08/11 0800) Resp:  [18-23] 19  (08/11 0452) BP: (117-173)/(71-83) 135/78 mmHg (08/11 1025) SpO2:  [93 %-96 %] 93 % (08/11 0452)  Hemodynamic parameters for last 24 hours:    Intake/Output from previous day: 08/10 0701 - 08/11 0700 In: 7146.7 [P.O.:480; I.V.:6116.7; IV Piggyback:550] Out: 3000 [Urine:3000] Intake/Output this shift:    General appearance: alert and no distress Heart: regular rate and rhythm Lungs: clear to auscultation bilaterally  Lab Results:  [facility] 05/13/12 0612 05/12/12 0513  WBC 9.9 11.5*  HGB 13.1 12.2*  HCT 38.4* 34.9*  PLT 190 187   BMET:  Basename 05/13/12 0612 05/12/12 0513  NA 140 138  K 4.2 3.9  CL 105 103  CO2 21 24  GLUCOSE 98 324*  BUN 16 19  CREATININE 0.85 0.95  CALCIUM 8.7 8.8    PT/INR: No results found for this basename: LABPROT,INR in the last 72 hours ABG No results found for this basename: phart, pco2, po2, hco3, tco2, acidbasedef, o2sat   CBG (last 3)   Basename 05/14/12 1200 05/14/12 0808 05/13/12 2116  GLUCAP 114* 102* 100*    Assessment/Plan: S/P   For mediastinoscopy tomorrow Patient aware of risks and benefits All questions answered preop orders written   LOS: 7 days    HENDRICKSON,STEVEN C 05/14/2012

## 2012-05-14 NOTE — Progress Notes (Signed)
[facility] PROGRESS NOTE  Chad Vasquez ZOX:[id number] DOB: 1961/09/20 DOA: 05/07/2012   Assessment/Plan: Patient Active Hospital Problem List: Multiple sclerosis exacerbation (05/07/2012) -currently stable has completed a five-day course of Solu-Medrol. We'll do physical therapy to see him.  Fever (05/07/2012) -quite concerning that he is having night sweats but high fevers has been on empiric treatment with vancomycin zosyn. He continues to spike fevers. A CT scan of the chest concerning for lymphadenopathy, HIV negative. -Cardiothoracic for biopsy 8/12 -Blood cultures x2 continue to be negative CSF cultures are negative urine cultures are negative, TSH is within normal limits the ESR continues to be high. -schedule ibuprofen for fever.  HTN (hypertension) (05/07/2012) -blood pressure borderline high. Resume her medication. This was not recently started.  Chronic pain (05/07/2012) -continue narcotics and ibuprofen. Get physical therapy. -start IV narcotics.  Polysubstance abuse (05/07/2012) -counseling.  Code Status: Full code Family Communication: [phone] Disposition Plan: To determine     LOS: 7 days   Procedures:    Antibiotics:  Vancomycin and zosyn   Subjective: Patient relates pain is improves continue to have fevers  Objective: Filed Vitals:   05/13/12 0800 05/13/12 1500 05/13/12 2100 05/14/12 0452  BP:  117/71 173/83 156/77  Pulse:  76 100 103  Temp:  97.5 F (36.4 C) 102.6 F (39.2 C) 102 F (38.9 C)  TempSrc:  Oral Oral Oral  Resp:  18 23 19   Height:      Weight: 116 kg (255 lb 11.7 oz)     SpO2:  96% 95% 93%    Intake/Output Summary (Last 24 hours) at 05/14/12 0951 Last data filed at 05/14/12 0321  Gross per 24 hour  Intake 1516.67 ml  Output   2400 ml  Net -883.33 ml   Weight change:   Exam:  General: Alert, awake, oriented x3, in no acute distress.  HEENT: No bruits, no goiter.  Heart: Regular rate and rhythm, without  murmurs, rubs, gallops.  Lungs: Good air movement, bilateral air movement.    Data Reviewed: Basic Metabolic Panel:  Lab 05/13/12 5621 05/12/12 0513 05/07/12 1800  NA 140 138 135  K 4.2 3.9 --  CL 105 103 101  CO2 21 24 24   GLUCOSE 98 324* 117*  BUN 16 19 13   CREATININE 0.85 0.95 1.04  CALCIUM 8.7 8.8 9.2  MG -- -- --  PHOS -- -- --   Liver Function Tests:  Lab 05/13/12 0612 05/12/12 0513  AST 56* 53*  ALT 203* 229*  ALKPHOS 84 92  BILITOT 0.4 0.3  PROT 6.4 6.3  ALBUMIN 2.8* 2.8*   No results found for this basename: LIPASE:5,AMYLASE:5 in the last 168 hours No results found for this basename: AMMONIA:5 in the last 168 hours CBC:  Lab 05/13/12 0612 05/12/12 0513 05/07/12 1800  WBC 9.9 11.5* 11.3*  NEUTROABS -- -- 9.7*  HGB 13.1 12.2* 13.5  HCT 38.4* 34.9* 39.6  MCV 91.4 91.6 93.4  PLT 190 187 159   Cardiac Enzymes: No results found for this basename: CKTOTAL:5,CKMB:5,CKMBINDEX:5,TROPONINI:5 in the last 168 hours BNP: No components found with this basename: POCBNP:5 CBG:  Lab 05/14/12 0808 05/13/12 2116 05/13/12 1659 05/13/12 1153 05/13/12 0800  GLUCAP 102* 100* 79 97 110*    Recent Results (from the past 240 hour(s))  CULTURE, BLOOD (ROUTINE X 2)     Status: Normal   Collection Time   05/07/12  5:45 PM      Component Value Range Status Comment   Specimen Description  BLOOD LEFT ARM   Final    Special Requests BOTTLES DRAWN AEROBIC AND ANAEROBIC 4CC   Final    Culture NO GROWTH 5 DAYS   Final    Report Status 05/12/2012 FINAL   Final   URINE CULTURE     Status: Normal   Collection Time   05/07/12  5:55 PM      Component Value Range Status Comment   Specimen Description URINE, CATHETERIZED   Final    Special Requests NONE   Final    Culture  Setup Time 05/07/2012 21:53   Final    Colony Count NO GROWTH   Final    Culture NO GROWTH   Final    Report Status 05/09/2012 FINAL   Final   CULTURE, BLOOD (ROUTINE X 2)     Status: Normal   Collection Time    05/07/12  6:00 PM      Component Value Range Status Comment   Specimen Description BLOOD RIGHT ARM   Final    Special Requests BOTTLES DRAWN AEROBIC AND ANAEROBIC 6CC   Final    Culture NO GROWTH 5 DAYS   Final    Report Status 05/12/2012 FINAL   Final   CULTURE, BLOOD (ROUTINE X 2)     Status: Normal (Preliminary result)   Collection Time   05/10/12  5:05 PM      Component Value Range Status Comment   Specimen Description BLOOD LEFT HAND   Final    Special Requests BOTTLES DRAWN AEROBIC AND ANAEROBIC 12CC   Final    Culture NO GROWTH 3 DAYS   Final    Report Status PENDING   Incomplete   CULTURE, BLOOD (ROUTINE X 2)     Status: Normal (Preliminary result)   Collection Time   05/10/12  5:10 PM      Component Value Range Status Comment   Specimen Description BLOOD RIGHT ANTECUBITAL   Final    Special Requests BOTTLES DRAWN AEROBIC AND ANAEROBIC 12CC   Final    Culture NO GROWTH 3 DAYS   Final    Report Status PENDING   Incomplete      Studies: Ct Chest W Contrast  05/12/2012  *RADIOLOGY REPORT*  Clinical Data:  Fever unknown origin.  Chronic back pain.  Multiple sclerosis.  Weakness.  CT CHEST, ABDOMEN AND PELVIS WITH CONTRAST  Technique:  Multidetector CT imaging of the chest, abdomen and pelvis was performed following the standard protocol during bolus administration of intravenous contrast.  Contrast: 1 OMNIPAQUE IOHEXOL 300 MG/ML  SOLN (100 ml)  Comparison:  Multiple exams, including 05/07/2012  CT CHEST  Findings:  Right paratracheal node measures 1.3 cm in short axis, image 17 of series 3.  An AP window lymph node measures 1.6 cm in short axis, image 24 of series 3.  Right hilar adenopathy noted with one right hilar node measuring 1.5 cm in short axis, image 32 of series 3.  Left hilar adenopathy noted with several small calcifications, and also scattered infrahilar nodes.  Conglomerate subcarinal adenopathy measures 2.2 cm in short axis, image 30 of series 3.  Scattered small paraesophageal  lymph nodes are present in the lower thorax.  Heart size is within normal limits.  A small hiatal hernia is present.  A 4 mm densely calcified right upper lobe pulmonary nodule on image 14 of series 4 is compatible with calcified granuloma.  Centrilobular emphysema noted.  There is mild atelectasis or scarring in the posterior basal segments  of both lower lobes. Several additional small calcified right upper lobe granulomas are present.  There are several small calcified granulomas in the left lung.  IMPRESSION:  1.  Pathologic mediastinal and hilar adenopathy.  No airway thickening or pleural nodularity to suggest pulmonary parenchymal findings of sarcoidosis.  The appearance could reflect lymphoma. Tissue diagnosis is likely warranted. 2.  Small hiatal hernia. 3.  Old granulomatous disease.  CT ABDOMEN AND PELVIS  Findings:  Diffuse mild low density in the liver suggests diffuse mild hepatic steatosis.  The spleen, adrenal glands, and pancreas appear unremarkable.  The gallbladder and biliary system appear unremarkable.  Small peripancreatic lymph nodes do not appear pathologically enlarged by size criteria.  The right kidney appears normal.  Several small hypodense lesions in the left kidney are technically too small to characterize although statistically likely represent cysts.  The renal calculi referenced on the prior ultrasound examination from 07/06/2001 are not observed on today's exam. No pathologic pelvic adenopathy is identified.  The appendix is within normal limits.  No significant colonic diverticular disease observed.  No dilated bowel noted.  Urinary bladder appears unremarkable.  Degenerative grade 1 anterolisthesis of L5 on S1 is noted.  IMPRESSION:  1.  The suspected diffuse mild hepatic steatosis. 2.  Several small hypodense lesions in the left kidney are likely cyst but technically too small to characterize. 3.  Degenerative grade 1 anterolisthesis of L5 on S1.  Original Report Authenticated  By: Dellia Cloud, M.D.   Mr Laqueta Jean Wo Contrast  05/09/2012  *RADIOLOGY REPORT*  Clinical Data:  52 year old male with lower extremity weakness. History of multiple sclerosis.  MRI HEAD WITHOUT AND WITH CONTRAST MRI CERVICAL SPINE WITHOUT AND WITH CONTRAST  Technique:  Multiplanar, multiecho pulse sequences of the brain and surrounding structures, and cervical spine, to include the craniocervical junction and cervicothoracic junction, were obtained without and with intravenous contrast.  Contrast: 20mL MULTIHANCE GADOBENATE DIMEGLUMINE 529 MG/ML IV SOLN  Comparison:  Thoracic spine study performed the previous day and reported separately.  Brain and cervical MRI 09/15/2010 and earlier.  MRI HEAD  Findings:  Normal cerebral volume. No restricted diffusion to suggest acute infarction.  No midline shift, mass effect, evidence of mass lesion, ventriculomegaly, extra-axial collection or acute intracranial hemorrhage.  Cervicomedullary junction and pituitary are within normal limits.  Major intracranial vascular flow voids are stable, dominant left vertebral artery.  Normal bone marrow signal.  No abnormal enhancement identified.  Chronically advanced T2 and FLAIR hyperintense foci throughout the brain are stable since 2011. Cerebral hemisphere lesions are oriented perpendicular to the lateral ventricles as before, typical in appearance for multiple sclerosis.  There is involvement of the brain stem as before. Cervical spine findings are below.  Orbit soft tissues are within normal limits.  Stable minor paranasal sinus mucosal thickening.  Mastoids remain clear. Negative scalp soft tissues.  IMPRESSION: 1.  Stable MRI appearance of chronic demyelinating disease of the brain.  No enhancement or evidence of acute demyelination. 2.  Cervical spine findings are below. 3.  No new intracranial abnormality.  MRI CERVICAL SPINE  Findings: Stable cervical vertebral height and alignment with preserved lordosis. No marrow  edema or evidence of acute osseous abnormality.  Visualized paraspinal soft tissues are within normal limits.  Chronic T2 and STIR hyperintense right lateral spinal cord plaque at C3 is re-identified and not significantly changed.  Elsewhere the cervical spinal cord signal is within normal limits.  Upper thoracic spinal cord plaque is partially visible  on the left at T2 and is also chronic.  No abnormal spinal cord or intradural enhancement to suggest acute demyelination.  Stable chronic predominately mild cervical spine degenerative changes.  There is minimal to mild spinal stenosis at C6-C7 with moderate to severe bilateral foraminal stenosis again noted, related to disc bulge and ligament flavum hypertrophy.  There is a mild chronic disc bulge at C3-C4 with chronic uncovertebral hypertrophy and mild to moderate to bilateral foraminal stenosis.  IMPRESSION: 1.  Stable chronic demyelinating lesions in the spinal cord at C3 and T2.  No new spinal cord signal abnormality.  No enhancement to suggest acute demyelination. 2.  Stable mild for age cervical degenerative changes.  Original Report Authenticated By: Harley Hallmark, M.D.   Mr Cervical Spine W Wo Contrast  05/09/2012  *RADIOLOGY REPORT*  Clinical Data:  51 year old male with lower extremity weakness. History of multiple sclerosis.  MRI HEAD WITHOUT AND WITH CONTRAST MRI CERVICAL SPINE WITHOUT AND WITH CONTRAST  Technique:  Multiplanar, multiecho pulse sequences of the brain and surrounding structures, and cervical spine, to include the craniocervical junction and cervicothoracic junction, were obtained without and with intravenous contrast.  Contrast: 20mL MULTIHANCE GADOBENATE DIMEGLUMINE 529 MG/ML IV SOLN  Comparison:  Thoracic spine study performed the previous day and reported separately.  Brain and cervical MRI 09/15/2010 and earlier.  MRI HEAD  Findings:  Normal cerebral volume. No restricted diffusion to suggest acute infarction.  No midline shift,  mass effect, evidence of mass lesion, ventriculomegaly, extra-axial collection or acute intracranial hemorrhage.  Cervicomedullary junction and pituitary are within normal limits.  Major intracranial vascular flow voids are stable, dominant left vertebral artery.  Normal bone marrow signal.  No abnormal enhancement identified.  Chronically advanced T2 and FLAIR hyperintense foci throughout the brain are stable since 2011. Cerebral hemisphere lesions are oriented perpendicular to the lateral ventricles as before, typical in appearance for multiple sclerosis.  There is involvement of the brain stem as before. Cervical spine findings are below.  Orbit soft tissues are within normal limits.  Stable minor paranasal sinus mucosal thickening.  Mastoids remain clear. Negative scalp soft tissues.  IMPRESSION: 1.  Stable MRI appearance of chronic demyelinating disease of the brain.  No enhancement or evidence of acute demyelination. 2.  Cervical spine findings are below. 3.  No new intracranial abnormality.  MRI CERVICAL SPINE  Findings: Stable cervical vertebral height and alignment with preserved lordosis. No marrow edema or evidence of acute osseous abnormality.  Visualized paraspinal soft tissues are within normal limits.  Chronic T2 and STIR hyperintense right lateral spinal cord plaque at C3 is re-identified and not significantly changed.  Elsewhere the cervical spinal cord signal is within normal limits.  Upper thoracic spinal cord plaque is partially visible on the left at T2 and is also chronic.  No abnormal spinal cord or intradural enhancement to suggest acute demyelination.  Stable chronic predominately mild cervical spine degenerative changes.  There is minimal to mild spinal stenosis at C6-C7 with moderate to severe bilateral foraminal stenosis again noted, related to disc bulge and ligament flavum hypertrophy.  There is a mild chronic disc bulge at C3-C4 with chronic uncovertebral hypertrophy and mild to  moderate to bilateral foraminal stenosis.  IMPRESSION: 1.  Stable chronic demyelinating lesions in the spinal cord at C3 and T2.  No new spinal cord signal abnormality.  No enhancement to suggest acute demyelination. 2.  Stable mild for age cervical degenerative changes.  Original Report Authenticated By: Ulla Potash III,  M.D.   Mr Thoracic Spine W Wo Contrast  05/08/2012  *RADIOLOGY REPORT*  Clinical Data: Lower extremity weakness.  Fever.  Multiple sclerosis.  MRI THORACIC SPINE WITHOUT AND WITH CONTRAST  Technique:  Multiplanar and multiecho pulse sequences of the thoracic spine were obtained without and with intravenous contrast.  Contrast: 20mL MULTIHANCE GADOBENATE DIMEGLUMINE 529 MG/ML IV SOLN  Comparison: Cervical MRI dated 09/15/2010  Findings: The patient has a subtle plaque in the left posterior lateral aspect of the thoracic spinal cord at T2, unchanged since the cervical MRI dated 09/15/2010.  There are also small focal plaques in the posterior columns at T10 and centrally in the spinal cord at T10-11.  The spinal cord is not enlarged.  There is no pathologic enhancement of the spinal cord or plaques after contrast administration.  The osseous structures of the thoracic spine are normal.  Discs are normal.  No foraminal or spinal stenosis.  The paraspinal soft tissues are normal.  IMPRESSION:  1.  Plaques in the thoracic spinal cord at T2, T10, and T11, most consistent with multiple sclerosis. 2.  No evidence of transverse myelitis or spinal abscess.  Original Report Authenticated By: Gwynn Burly, M.D.   Mr Lumbar Spine W Wo Contrast  05/08/2012  *RADIOLOGY REPORT*  Clinical Data: Lower extremity weakness.  Fever.  Multiple sclerosis.  MRI LUMBAR SPINE WITHOUT AND WITH CONTRAST  Technique:  Multiplanar and multiecho pulse sequences of the lumbar spine were obtained without and with intravenous contrast.  Contrast: 20mL MULTIHANCE GADOBENATE DIMEGLUMINE 529 MG/ML IV SOLN  Comparison: MRI dated  02/25/2012  Findings: Scan extends from T11-12 through S2.  Tip of the conus is at L1-2 and appears normal.  T11-12:  Normal.  T12-L1:  Small disc bulge central and slightly to the right without neural impingement without change.  L1-2:  Small broad-based disc bulge with no neural impingement, unchanged.  L2-3:  Far lateral disc bulges to the right and left without neural impingement and without change.  L3-4:  Far lateral annular tears of the right and left without neural impingement, unchanged.  Moderate bilateral facet arthritis. Slight narrowing of the spinal canal, unchanged.  L4-5:  Moderate bilateral facet hypertrophy.  Small annular tear to the left.  No neural impingement.  L5 S1:  5 mm spondylolisthesis with slight bulging of the uncovered disc with slight narrowing of the neural foramina, right greater than left, without focal neural impingement, unchanged.  IMPRESSION:   Multilevel degenerative disc disease without focal neural impingement.  No change since the prior exam.  No significant enhancement after contrast administration.  Original Report Authenticated By: Gwynn Burly, M.D.   Ct Abdomen Pelvis W Contrast  05/12/2012  *RADIOLOGY REPORT*  Clinical Data:  Fever unknown origin.  Chronic back pain.  Multiple sclerosis.  Weakness.  CT CHEST, ABDOMEN AND PELVIS WITH CONTRAST  Technique:  Multidetector CT imaging of the chest, abdomen and pelvis was performed following the standard protocol during bolus administration of intravenous contrast.  Contrast: 1 OMNIPAQUE IOHEXOL 300 MG/ML  SOLN (100 ml)  Comparison:  Multiple exams, including 05/07/2012  CT CHEST  Findings:  Right paratracheal node measures 1.3 cm in short axis, image 17 of series 3.  An AP window lymph node measures 1.6 cm in short axis, image 24 of series 3.  Right hilar adenopathy noted with one right hilar node measuring 1.5 cm in short axis, image 32 of series 3.  Left hilar adenopathy noted with several small calcifications, and  also scattered infrahilar nodes.  Conglomerate subcarinal adenopathy measures 2.2 cm in short axis, image 30 of series 3.  Scattered small paraesophageal lymph nodes are present in the lower thorax.  Heart size is within normal limits.  A small hiatal hernia is present.  A 4 mm densely calcified right upper lobe pulmonary nodule on image 14 of series 4 is compatible with calcified granuloma.  Centrilobular emphysema noted.  There is mild atelectasis or scarring in the posterior basal segments of both lower lobes. Several additional small calcified right upper lobe granulomas are present.  There are several small calcified granulomas in the left lung.  IMPRESSION:  1.  Pathologic mediastinal and hilar adenopathy.  No airway thickening or pleural nodularity to suggest pulmonary parenchymal findings of sarcoidosis.  The appearance could reflect lymphoma. Tissue diagnosis is likely warranted. 2.  Small hiatal hernia. 3.  Old granulomatous disease.  CT ABDOMEN AND PELVIS  Findings:  Diffuse mild low density in the liver suggests diffuse mild hepatic steatosis.  The spleen, adrenal glands, and pancreas appear unremarkable.  The gallbladder and biliary system appear unremarkable.  Small peripancreatic lymph nodes do not appear pathologically enlarged by size criteria.  The right kidney appears normal.  Several small hypodense lesions in the left kidney are technically too small to characterize although statistically likely represent cysts.  The renal calculi referenced on the prior ultrasound examination from 07/06/2001 are not observed on today's exam. No pathologic pelvic adenopathy is identified.  The appendix is within normal limits.  No significant colonic diverticular disease observed.  No dilated bowel noted.  Urinary bladder appears unremarkable.  Degenerative grade 1 anterolisthesis of L5 on S1 is noted.  IMPRESSION:  1.  The suspected diffuse mild hepatic steatosis. 2.  Several small hypodense lesions in the left  kidney are likely cyst but technically too small to characterize. 3.  Degenerative grade 1 anterolisthesis of L5 on S1.  Original Report Authenticated By: Dellia Cloud, M.D.   US Venous Img Lower Bilateral  05/08/2012  *RADIOLOGY REPORT*  Clinical Data: Bilateral leg weakness and hip pain, history of smoking, evaluate for DVT  BILATERAL LOWER EXTREMITY VENOUS DUPLEX ULTRASOUND  Technique:  Gray-scale sonography with graded compression, as well as color Doppler and duplex ultrasound, were performed to evaluate the deep venous system of both lower extremities from the level of the common femoral vein through the popliteal and proximal calf veins.  Spectral Doppler was utilized to evaluate flow at rest and with distal augmentation maneuvers.  Comparison:  None.  Findings:  Normal compressibility of bilateral common femoral, superficial femoral, and popliteal veins is demonstrated, as well as the visualized proximal calf veins.  No filling defects to suggest DVT on grayscale or color Doppler imaging.  Doppler waveforms show normal direction of venous flow, normal respiratory phasicity and response to augmentation.  IMPRESSION: No evidence of deep vein thrombosis, within either lower extremity.  Original Report Authenticated By: Waynard Reeds, M.D.   Dg Chest Port 1 View  05/07/2012  *RADIOLOGY REPORT*  Clinical Data: Fever.  Infiltrate.  PORTABLE CHEST - 1 VIEW  Comparison: Multiple priors most recently 05/17/2012 at 1535 hours. Chest CT 01/13/2007.  Findings: Left costophrenic angle excluded from view.  Left basilar subsegmental atelectasis.  Prominent right pericardial fat pad. Cardiopericardial silhouette is within normal limits.  No airspace disease.  No effusion.  Calcified granuloma in the right upper lobe and punctate calcifications of the mediastinal lymph nodes again noted compatible with old granulomatous  disease.  IMPRESSION: No acute cardiopulmonary disease.  Subsegmental left basilar  atelectasis.  Original Report Authenticated By: Andreas Newport, M.D.   Dg Chest Port 1 View  05/07/2012  *RADIOLOGY REPORT*  Clinical Data: Weakness.  Fall.  Multiple sclerosis.  Smoker.  PORTABLE CHEST - 1 VIEW  Comparison: 06/14/2011 chest radiograph.  CT chest 01/13/2007  Findings: The normal heart size and pulmonary vascularity. Calcified granulomas in the mid lungs.  Nodular hilar opacities consistent with prominent lymph nodes and granulomas as seen on previous chest CT.  Mild emphysematous changes in the upper lungs. No focal airspace consolidation.  No blunting of costophrenic angles.  No pneumothorax.  Old left rib fracture.  IMPRESSION: Calcified granulomas with calcified hilar lymph nodes. Emphysematous changes.  No evidence of active pulmonary disease.  Original Report Authenticated By: Marlon Pel, M.D.   Dg Fluoro Guide Lumbar Puncture  05/11/2012  *RADIOLOGY REPORT*  Clinical Data: Multiple Sclerosis, fever and headaches.  DIAGNOSTIC LUMBAR PUNCTURE UNDER FLUOROSCOPIC GUIDANCE  Fluoroscopy time:  1.8. minutes.  Technique:  Informed consent was obtained from the patient prior to the procedure, including potential complications of headache, allergy, and pain. Lovenox was withheld for 1.5 days.  With the patient prone oblique, the lower back was prepped with Betadine. 1% Lidocaine was used for local anesthesia.  Lumbar puncture was performed at the L4-L5 level using a 12.7 cm 20  gauge needle with return of blood tinged CSF with an opening pressure of 19.2. cm water.   Only three ml of CSF were obtained for laboratory studies, even after placing the  patient in reverse Trendelenburg position. The patient tolerated the procedure well and there were no apparent complications.  IMPRESSION: Slightly elevated opening pressure of 19.2 cm water.  Approximately 3 ml of blood-tinged CSF was obtained for pathological analysis.  Original Report Authenticated By: Brandon Melnick, M.D.    Scheduled  Meds:    . aspirin EC  81 mg Oral Daily  . carbamazepine  200 mg Oral TID  . cholecalciferol  2,000 Units Oral [id number]  . docusate sodium  100 mg Oral BID  . gabapentin  600 mg Oral TID  . glatiramer  20 mg Subcutaneous Daily  . ibuprofen  600 mg Oral TID  . insulin aspart  0-15 Units Subcutaneous TID WC  . insulin aspart  0-5 Units Subcutaneous QHS  . losartan  100 mg Oral Daily  . pantoprazole  40 mg Oral BID AC  . piperacillin-tazobactam (ZOSYN)  IV  3.375 g Intravenous Q8H  . sodium chloride  3 mL Intravenous Q12H  . vancomycin  1,250 mg Intravenous Q8H  . DISCONTD: cefTRIAXone (ROCEPHIN)  IV  2 g Intravenous Q12H  . DISCONTD: vancomycin  1,250 mg Intravenous Q8H   Continuous Infusions:    . sodium chloride 100 mL/hr at 05/13/12 2331    Lambert Keto, MD  [facility] [facility] Pager [phone]  If 7PM-7AM, please contact night-coverage [email or web address] Password [facility] 05/14/2012, 9:51 AM

## 2012-05-15 ENCOUNTER — Inpatient Hospital Stay (HOSPITAL_COMMUNITY): Payer: Medicare Other | Admitting: Anesthesiology

## 2012-05-15 ENCOUNTER — Inpatient Hospital Stay (HOSPITAL_COMMUNITY): Payer: Medicare Other

## 2012-05-15 ENCOUNTER — Encounter (HOSPITAL_COMMUNITY): Payer: Self-pay | Admitting: Anesthesiology

## 2012-05-15 ENCOUNTER — Encounter (HOSPITAL_COMMUNITY)
Admission: EM | Disposition: A | Discharge status: Skilled Nursing Facility | Payer: Self-pay | Source: Home / Self Care | Attending: Internal Medicine

## 2012-05-15 DIAGNOSIS — R599 Enlarged lymph nodes, unspecified: Secondary | ICD-10-CM

## 2012-05-15 HISTORY — PX: MEDIASTINOSCOPY: SHX5086

## 2012-05-15 LAB — GLUCOSE, CAPILLARY
Glucose-Capillary: 134 mg/dL — ABNORMAL HIGH (ref 70–99)
Glucose-Capillary: 127 mg/dL — ABNORMAL HIGH (ref 70–99)
Glucose-Capillary: 118 mg/dL — ABNORMAL HIGH (ref 70–99)
Glucose-Capillary: 132 mg/dL — ABNORMAL HIGH (ref 70–99)

## 2012-05-15 LAB — CULTURE, BLOOD (ROUTINE X 2)
Culture: NO GROWTH
Culture: NO GROWTH

## 2012-05-15 LAB — HIV-1 RNA QUANT-NO REFLEX-BLD: HIV 1 RNA Quant: <20 copies/mL (ref <20)

## 2012-05-15 SURGERY — MEDIASTINOSCOPY
Anesthesia: General | Site: Neck | Wound class: Clean

## 2012-05-15 MED ORDER — LACTATED RINGERS IV SOLN
INTRAVENOUS | Status: DC | PRN
  Administered 2012-05-15 (×3): via INTRAVENOUS

## 2012-05-15 MED ORDER — FENTANYL CITRATE 0.05 MG/ML IJ SOLN
INTRAMUSCULAR | Status: DC | PRN
  Administered 2012-05-15: 100 ug via INTRAVENOUS
  Administered 2012-05-15: 50 ug via INTRAVENOUS

## 2012-05-15 MED ORDER — 0.9 % SODIUM CHLORIDE (POUR BTL) OPTIME
TOPICAL | Status: DC | PRN
  Administered 2012-05-15: 50 mL

## 2012-05-15 MED ORDER — LIDOCAINE HCL (CARDIAC) 20 MG/ML IV SOLN
INTRAVENOUS | Status: DC | PRN
  Administered 2012-05-15: 50 mg via INTRAVENOUS

## 2012-05-15 MED ORDER — MIDAZOLAM HCL 5 MG/5ML IJ SOLN
INTRAMUSCULAR | Status: DC | PRN
  Administered 2012-05-15: 2 mg via INTRAVENOUS

## 2012-05-15 MED ORDER — HYDROMORPHONE HCL PF 1 MG/ML IJ SOLN
0.2500 mg | INTRAMUSCULAR | Status: DC | PRN
  Administered 2012-05-15 (×2): 0.5 mg via INTRAVENOUS

## 2012-05-15 MED ORDER — NEOSTIGMINE METHYLSULFATE 1 MG/ML IJ SOLN
INTRAMUSCULAR | Status: DC | PRN
  Administered 2012-05-15: 5 mg via INTRAVENOUS

## 2012-05-15 MED ORDER — ACETAMINOPHEN 10 MG/ML IV SOLN
INTRAVENOUS | Status: AC
  Filled 2012-05-15: qty 100

## 2012-05-15 MED ORDER — HYDROCODONE-ACETAMINOPHEN 10-325 MG PO TABS
1.0000 | ORAL_TABLET | ORAL | Status: DC | PRN
  Administered 2012-05-15: 1 via ORAL
  Administered 2012-05-15 – 2012-05-16 (×2): 2 via ORAL
  Administered 2012-05-17: 1 via ORAL
  Filled 2012-05-15: qty 2
  Filled 2012-05-15: qty 1
  Filled 2012-05-15 (×2): qty 2

## 2012-05-15 MED ORDER — ONDANSETRON HCL 4 MG/2ML IJ SOLN
INTRAMUSCULAR | Status: DC | PRN
  Administered 2012-05-15: 4 mg via INTRAVENOUS

## 2012-05-15 MED ORDER — PROPOFOL 10 MG/ML IV EMUL
INTRAVENOUS | Status: DC | PRN
  Administered 2012-05-15: 150 mg via INTRAVENOUS

## 2012-05-15 MED ORDER — PHENYLEPHRINE HCL 10 MG/ML IJ SOLN
INTRAMUSCULAR | Status: DC | PRN
  Administered 2012-05-15: 120 ug via INTRAVENOUS
  Administered 2012-05-15: 80 ug via INTRAVENOUS
  Administered 2012-05-15 (×2): 120 ug via INTRAVENOUS
  Administered 2012-05-15: 80 ug via INTRAVENOUS

## 2012-05-15 MED ORDER — CHLORHEXIDINE GLUCONATE CLOTH 2 % EX PADS
6.0000 | MEDICATED_PAD | Freq: Every day | CUTANEOUS | Status: DC
  Administered 2012-05-15: 6 via TOPICAL

## 2012-05-15 MED ORDER — ACETAMINOPHEN 10 MG/ML IV SOLN
INTRAVENOUS | Status: DC | PRN
  Administered 2012-05-15: 1000 mg via INTRAVENOUS

## 2012-05-15 MED ORDER — VECURONIUM BROMIDE 10 MG IV SOLR
INTRAVENOUS | Status: DC | PRN
  Administered 2012-05-15: 2 mg via INTRAVENOUS
  Administered 2012-05-15: 5 mg via INTRAVENOUS

## 2012-05-15 MED ORDER — GLYCOPYRROLATE 0.2 MG/ML IJ SOLN
INTRAMUSCULAR | Status: DC | PRN
  Administered 2012-05-15: 1 mg via INTRAVENOUS

## 2012-05-15 MED ORDER — HYDROMORPHONE HCL PF 1 MG/ML IJ SOLN
2.0000 mg | INTRAMUSCULAR | Status: DC | PRN
  Administered 2012-05-15 – 2012-05-17 (×5): 2 mg via INTRAVENOUS
  Filled 2012-05-15: qty 1
  Filled 2012-05-15 (×3): qty 2
  Filled 2012-05-15: qty 1
  Filled 2012-05-15: qty 2

## 2012-05-15 MED ORDER — HYDROMORPHONE HCL PF 1 MG/ML IJ SOLN
INTRAMUSCULAR | Status: AC
  Filled 2012-05-15: qty 1

## 2012-05-15 MED ORDER — EPHEDRINE SULFATE 50 MG/ML IJ SOLN
INTRAMUSCULAR | Status: DC | PRN
  Administered 2012-05-15 (×4): 10 mg via INTRAVENOUS
  Administered 2012-05-15: 20 mg via INTRAVENOUS
  Administered 2012-05-15: 10 mg via INTRAVENOUS

## 2012-05-15 SURGICAL SUPPLY — 47 items
ADH SKN CLS APL DERMABOND .7 (GAUZE/BANDAGES/DRESSINGS) ×2
APPLIER CLIP LOGIC TI 5 (MISCELLANEOUS) ×2 IMPLANT
APR CLP MED LRG 33X5 (MISCELLANEOUS) ×1
BLADE SURG 15 STRL LF DISP TIS (BLADE) ×1 IMPLANT
BLADE SURG 15 STRL SS (BLADE) ×2
CANISTER SUCTION 2500CC (MISCELLANEOUS) ×2 IMPLANT
CLIP TI MEDIUM 6 (CLIP) IMPLANT
CLOTH BEACON ORANGE TIMEOUT ST (SAFETY) ×2 IMPLANT
CONT SPEC 4OZ CLIKSEAL STRL BL (MISCELLANEOUS) ×4 IMPLANT
COVER SURGICAL LIGHT HANDLE (MISCELLANEOUS) ×4 IMPLANT
DERMABOND ADVANCED (GAUZE/BANDAGES/DRESSINGS) ×2
DERMABOND ADVANCED .7 DNX12 (GAUZE/BANDAGES/DRESSINGS) ×2 IMPLANT
DRAPE LAPAROTOMY T 102X78X121 (DRAPES) ×2 IMPLANT
ELECT REM PT RETURN 9FT ADLT (ELECTROSURGICAL) ×2
ELECTRODE REM PT RTRN 9FT ADLT (ELECTROSURGICAL) ×1 IMPLANT
GAUZE SPONGE 4X4 16PLY XRAY LF (GAUZE/BANDAGES/DRESSINGS) ×2 IMPLANT
GLOVE BIOGEL PI IND STRL 6 (GLOVE) ×1 IMPLANT
GLOVE BIOGEL PI INDICATOR 6 (GLOVE) ×1
GLOVE EUDERMIC 7 POWDERFREE (GLOVE) ×2 IMPLANT
GOWN PREVENTION PLUS XLARGE (GOWN DISPOSABLE) ×2 IMPLANT
GOWN STRL NON-REIN LRG LVL3 (GOWN DISPOSABLE) ×2 IMPLANT
HEMOSTAT SURGICEL 2X14 (HEMOSTASIS) IMPLANT
KIT BASIN OR (CUSTOM PROCEDURE TRAY) ×2 IMPLANT
KIT ROOM TURNOVER OR (KITS) ×2 IMPLANT
NS IRRIG 1000ML POUR BTL (IV SOLUTION) ×2 IMPLANT
PACK SURGICAL SETUP 50X90 (CUSTOM PROCEDURE TRAY) ×2 IMPLANT
PAD ARMBOARD 7.5X6 YLW CONV (MISCELLANEOUS) ×4 IMPLANT
PENCIL BUTTON HOLSTER BLD 10FT (ELECTRODE) ×2 IMPLANT
SPONGE GAUZE 4X4 12PLY (GAUZE/BANDAGES/DRESSINGS) ×2 IMPLANT
SPONGE INTESTINAL PEANUT (DISPOSABLE) IMPLANT
SUT SILK 2 0 TIES 10X30 (SUTURE) IMPLANT
SUT SILK 3 0 (SUTURE) ×2
SUT SILK 3-0 18XBRD TIE 12 (SUTURE) ×1 IMPLANT
SUT VIC AB 2-0 CT1 27 (SUTURE) ×2
SUT VIC AB 2-0 CT1 TAPERPNT 27 (SUTURE) ×1 IMPLANT
SUT VIC AB 3-0 SH 18 (SUTURE) IMPLANT
SUT VIC AB 3-0 SH 27 (SUTURE) ×2
SUT VIC AB 3-0 SH 27X BRD (SUTURE) ×1 IMPLANT
SUT VICRYL 4-0 PS2 18IN ABS (SUTURE) IMPLANT
SWAB COLLECTION DEVICE MRSA (MISCELLANEOUS) IMPLANT
SYRINGE 10CC LL (SYRINGE) ×2 IMPLANT
TOWEL OR 17X24 6PK STRL BLUE (TOWEL DISPOSABLE) ×2 IMPLANT
TOWEL OR 17X26 10 PK STRL BLUE (TOWEL DISPOSABLE) ×2 IMPLANT
TRAY FOLEY IC TEMP SENS 14FR (CATHETERS) ×2 IMPLANT
TUBE ANAEROBIC SPECIMEN COL (MISCELLANEOUS) IMPLANT
TUBE CONNECTING 12X1/4 (SUCTIONS) ×2 IMPLANT
WATER STERILE IRR 1000ML POUR (IV SOLUTION) ×2 IMPLANT

## 2012-05-15 NOTE — Preoperative (Signed)
Beta Blockers   Reason not to administer Beta Blockers:Not Applicable 

## 2012-05-15 NOTE — Anesthesia Postprocedure Evaluation (Signed)
  Anesthesia Post-op Note  Patient: Chad Vasquez  Procedure(s) Performed: Procedure(s) (LRB): MEDIASTINOSCOPY (N/A)  Patient Location: PACU  Anesthesia Type: General  Level of Consciousness: awake and alert   Airway and Oxygen Therapy: Patient Spontanous Breathing and Patient connected to nasal cannula oxygen  Post-op Pain: mild  Post-op Assessment: Post-op Vital signs reviewed, Patient's Cardiovascular Status Stable, Respiratory Function Stable, Patent Airway and No signs of Nausea or vomiting  Post-op Vital Signs: Reviewed and stable  Complications: No apparent anesthesia complications

## 2012-05-15 NOTE — Brief Op Note (Signed)
05/07/2012 - 05/15/2012  9:52 AM  PA TIENT:  Chad Vasquez  51 y.o. male  PRE-OPERATIVE DIAGNOSIS:  Mediastinal adenopathy  POST-OPERATIVE DIAGNOSIS:  mediastinal adenopathy  PROCEDURE:  Procedure(s) (LRB): MEDIASTINOSCOPY (N/A)  SURGEON:  Surgeon(s) and Role:    * Loreli Slot, MD - Primary  ANESTHESIA:   general  EBL:  Total I/O In: 2200 [I.V.:2200] Out: 25 [Blood:25]  BLOOD ADMINISTERED:none  SPECIMEN:  Source of Specimen:  Level 2R, 4R x 2, & lymph nodes  DISPOSITION OF SPECIMEN:  PATH and MICRO  COUNTS:  YES  PLAN OF CARE: Return to inpatient unit  PATIENT DISPOSITION:  PACU - hemodynamically stable.   Delay start of Pharmacological VTE agent (>24hrs) due to surgical blood loss or risk of bleeding: Yes

## 2012-05-15 NOTE — Progress Notes (Signed)
Physical Therapy Note: pt currently in OR for mediastinoscopy and unable to participate in therapy at this time. Will attempt tomorrow if cleared and ordered post op. Thanks Delaney Meigs, PT [phone]

## 2012-05-15 NOTE — Interval H&P Note (Signed)
History and Physical Interval Note:  05/15/2012 7:28 AM  Chad Vasquez  has presented today for surgery, with the diagnosis of mediastinum  The various methods of treatment have been discussed with the patient and family. After consideration of risks, benefits and other options for treatment, the patient has consented to  Procedure(s) (LRB): MEDIASTINOSCOPY (N/A) as a surgical intervention .  The patient's history has been reviewed, patient examined, no change in status, stable for surgery.  I have reviewed the patient's chart and labs.  Questions were answered to the patient's satisfaction.     Chad Vasquez

## 2012-05-15 NOTE — H&P (View-Only) (Signed)
  Subjective: Fevers again last PM  Objective: Vital signs in last 24 hours: Temp:  [97.5 F (36.4 C)-102.6 F (39.2 C)] 102 F (38.9 C) (08/11 0452) Pulse Rate:  [76-103] 99  (08/11 1025) Cardiac Rhythm:  [-] Normal sinus rhythm (08/11 0800) Resp:  [18-23] 19  (08/11 0452) BP: (117-173)/(71-83) 135/78 mmHg (08/11 1025) SpO2:  [93 %-96 %] 93 % (08/11 0452)  Hemodynamic parameters for last 24 hours:    Intake/Output from previous day: 08/10 0701 - 08/11 0700 In: 7146.7 [P.O.:480; I.V.:6116.7; IV Piggyback:550] Out: 3000 [Urine:3000] Intake/Output this shift:    General appearance: alert and no distress Heart: regular rate and rhythm Lungs: clear to auscultation bilaterally  Lab Results:  Basename 05/13/12 0612 05/12/12 0513  WBC 9.9 11.5*  HGB 13.1 12.2*  HCT 38.4* 34.9*  PLT 190 187   BMET:  Basename 05/13/12 0612 05/12/12 0513  NA 140 138  K 4.2 3.9  CL 105 103  CO2 21 24  GLUCOSE 98 324*  BUN 16 19  CREATININE 0.85 0.95  CALCIUM 8.7 8.8    PT/INR: No results found for this basename: LABPROT,INR in the last 72 hours ABG No results found for this basename: phart, pco2, po2, hco3, tco2, acidbasedef, o2sat   CBG (last 3)   Basename 05/14/12 1200 05/14/12 0808 05/13/12 2116  GLUCAP 114* 102* 100*    Assessment/Plan: S/P   For mediastinoscopy tomorrow Patient aware of risks and benefits All questions answered preop orders written   LOS: 7 days    Junah Yam C 05/14/2012   

## 2012-05-15 NOTE — Transfer of Care (Signed)
Immediate Anesthesia Transfer of Care Note  Patient: Chad Vasquez  Procedure(s) Performed: Procedure(s) (LRB): MEDIASTINOSCOPY (N/A)  Patient Location: PACU  Anesthesia Type: General  Level of Consciousness: awake, alert  and oriented  Airway & Oxygen Therapy: Patient Spontanous Breathing and Patient connected to face mask oxygen  Post-op Assessment: Report given to PACU RN, Post -op Vital signs reviewed and stable and Patient moving all extremities X 4  Post vital signs: Reviewed and stable  Complications: No apparent anesthesia complications

## 2012-05-15 NOTE — Anesthesia Preprocedure Evaluation (Addendum)
Anesthesia Evaluation  Patient identified by MRN, date of birth, ID band Patient awake    Reviewed: Allergy & Precautions, H&P , NPO status , Patient's Chart, lab work & pertinent test results, reviewed documented beta blocker date and time   Airway Mallampati: III TM Distance: >3 FB Neck ROM: Full    Dental No notable dental hx. (+) Dental Advidsory Given and Teeth Intact   Pulmonary Current Smoker,  breath sounds clear to auscultation  Pulmonary exam normal       Cardiovascular hypertension, On Medications Rhythm:Regular Rate:Normal     Neuro/Psych Multiple Sclerosis negative psych ROS   GI/Hepatic negative GI ROS, Neg liver ROS,   Endo/Other  negative endocrine ROS  Renal/GU negative Renal ROS  negative genitourinary   Musculoskeletal   Abdominal   Peds  Hematology negative hematology ROS (+)   Anesthesia Other Findings   Reproductive/Obstetrics negative OB ROS                        Anesthesia Physical Anesthesia Plan  ASA: III  Anesthesia Plan: General   Post-op Pain Management:    Induction: Intravenous  Airway Management Planned: Oral ETT  Additional Equipment: Arterial line  Intra-op Plan:   Post-operative Plan:   Informed Consent: I have reviewed the patients History and Physical, chart, labs and discussed the procedure including the risks, benefits and alternatives for the proposed anesthesia with the patient or authorized representative who has indicated his/her understanding and acceptance.   Dental Advisory Given  Plan Discussed with: CRNA, Anesthesiologist and Surgeon  Anesthesia Plan Comments:        Anesthesia Quick Evaluation

## 2012-05-15 NOTE — Anesthesia Procedure Notes (Signed)
Procedure Name: Intubation Date/Time: 05/15/2012 7:58 AM Performed by: Carmela Rima Pre-anesthesia Checklist: Emergency Drugs available, Patient identified, Timeout performed, Suction available and Patient being monitored Patient Re-evaluated:Patient Re-evaluated prior to inductionOxygen Delivery Method: Circle system utilized Preoxygenation: Pre-oxygenation with 100% oxygen Intubation Type: IV induction Ventilation: Mask ventilation without difficulty Laryngoscope Size: Mac and 4 Grade View: Grade II Tube size: 7.5 mm Number of attempts: 1 Placement Confirmation: ETT inserted through vocal cords under direct vision,  breath sounds checked- equal and bilateral and positive ETCO2 Secured at: 23 cm Tube secured with: Tape Dental Injury: Teeth and Oropharynx as per pre-operative assessment

## 2012-05-15 NOTE — Progress Notes (Signed)
Pt. Moaning, grimacing and shaking, stating having pain in bilat. Hips.  Reported he had the same episode this am which is only relieved by dilaudid & Vicodin together.  Pt. Recently had dilaudid at 1315 so MD made aware since was too close to next dose.  New orders placed and carried out.  Will continue to monitor.  Forbes Cellar, RN

## 2012-05-15 NOTE — Progress Notes (Signed)
Pt starting shaking and complaining of pain in his legs. Per pt he can not move his legs and his hips are very painful. Per co-workers, pt has these spasms at the same time every morning. RN gave pt  1 vicodin PO and 1 mg of dilaudid IV. RN will continue to monitor pt.

## 2012-05-15 NOTE — Progress Notes (Signed)
[facility] PROGRESS NOTE  Chad Vasquez QMV:[id number] DOB: 1961-02-06 DOA: 05/07/2012   Assessment/Plan: Patient Active Hospital Problem List: Multiple sclerosis exacerbation (05/07/2012) -currently stable has completed a five-day course of Solu-Medrol.  -We'll do physical therapy to see him.  Fever (05/07/2012) -empiric treatment with vancomycin zosyn.  -He continues to spike fevers. Improved with ibuprofen. -A CT scan of the chest concerning for lymphadenopathy, HIV negative. -Cardiothoracic s/p biopsy 8/12 -Blood cultures x2 continue to be negative CSF cultures are negative urine cultures are negative, TSH is within normal limits the ESR continues to be high.  HTN (hypertension) (05/07/2012) -blood pressure borderline high.  -Resume her medication.   Chronic pain (05/07/2012) -continue narcotics and ibuprofen. Get physical therapy. -start IV narcotics.  Polysubstance abuse (05/07/2012) -counseling.  Code Status: Full code Family Communication: [phone] Disposition Plan: To determine     LOS: 8 days   Procedures:    Antibiotics:  Vancomycin and zosyn   Subjective: Patient relates pain is improves   Objective: Filed Vitals:   05/15/12 1100 05/15/12 1115 05/15/12 1130 05/15/12 1210  BP: 105/57 112/56 115/63 111/74  Pulse: 92 87 84 85  Temp:    97.3 F (36.3 C)  TempSrc:    Oral  Resp: 16 14 15 18   Height:      Weight:      SpO2: 92% 91% 94% 93%    Intake/Output Summary (Last 24 hours) at 05/15/12 1231 Last data filed at 05/15/12 0931  Gross per 24 hour  Intake 6193.33 ml  Output   2025 ml  Net 4168.33 ml   Weight change:   Exam:  General: Alert, awake, oriented x3, in no acute distress.  HEENT: No bruits, no goiter.  Scar in neck. Heart: Regular rate and rhythm, without murmurs, rubs, gallops.  Lungs: Good air movement, bilateral air movement.    Data Reviewed: Basic Metabolic Panel:  Lab 05/13/12 0272 05/12/12 0513  NA 140 138    K 4.2 3.9  CL 105 103  CO2 21 24  GLUCOSE 98 324*  BUN 16 19  CREATININE 0.85 0.95  CALCIUM 8.7 8.8  MG -- --  PHOS -- --   Liver Function Tests:  Lab 05/13/12 0612 05/12/12 0513  AST 56* 53*  ALT 203* 229*  ALKPHOS 84 92  BILITOT 0.4 0.3  PROT 6.4 6.3  ALBUMIN 2.8* 2.8*   No results found for this basename: LIPASE:5,AMYLASE:5 in the last 168 hours No results found for this basename: AMMONIA:5 in the last 168 hours CBC:  Lab 05/13/12 0612 05/12/12 0513  WBC 9.9 11.5*  NEUTROABS -- --  HGB 13.1 12.2*  HCT 38.4* 34.9*  MCV 91.4 91.6  PLT 190 187   Cardiac Enzymes: No results found for this basename: CKTOTAL:5,CKMB:5,CKMBINDEX:5,TROPONINI:5 in the last 168 hours BNP: No components found with this basename: POCBNP:5 CBG:  Lab 05/15/12 0947 05/15/12 0743 05/14/12 2141 05/14/12 1644 05/14/12 1200  GLUCAP 127* 134* 121* 101* 114*    Recent Results (from the past 240 hour(s))  CULTURE, BLOOD (ROUTINE X 2)     Status: Normal   Collection Time   05/07/12  5:45 PM      Component Value Range Status Comment   Specimen Description BLOOD LEFT ARM   Final    Special Requests BOTTLES DRAWN AEROBIC AND ANAEROBIC 4CC   Final    Culture NO GROWTH 5 DAYS   Final    Report Status 05/12/2012 FINAL   Final   URINE CULTURE  Status: Normal   Collection Time   05/07/12  5:55 PM      Component Value Range Status Comment   Specimen Description URINE, CATHETERIZED   Final    Special Requests NONE   Final    Culture  Setup Time 05/07/2012 21:53   Final    Colony Count NO GROWTH   Final    Culture NO GROWTH   Final    Report Status 05/09/2012 FINAL   Final   CULTURE, BLOOD (ROUTINE X 2)     Status: Normal   Collection Time   05/07/12  6:00 PM      Component Value Range Status Comment   Specimen Description BLOOD RIGHT ARM   Final    Special Requests BOTTLES DRAWN AEROBIC AND ANAEROBIC 6CC   Final    Culture NO GROWTH 5 DAYS   Final    Report Status 05/12/2012 FINAL   Final    CULTURE, BLOOD (ROUTINE X 2)     Status: Normal   Collection Time   05/10/12  5:05 PM      Component Value Range Status Comment   Specimen Description BLOOD LEFT HAND   Final    Special Requests BOTTLES DRAWN AEROBIC AND ANAEROBIC 12CC   Final    Culture NO GROWTH 5 DAYS   Final    Report Status 05/15/2012 FINAL   Final   CULTURE, BLOOD (ROUTINE X 2)     Status: Normal   Collection Time   05/10/12  5:10 PM      Component Value Range Status Comment   Specimen Description BLOOD RIGHT ANTECUBITAL   Final    Special Requests BOTTLES DRAWN AEROBIC AND ANAEROBIC 12CC   Final    Culture NO GROWTH 5 DAYS   Final    Report Status 05/15/2012 FINAL   Final      Studies: Ct Chest W Contrast  05/12/2012  *RADIOLOGY REPORT*  Clinical Data:  Fever unknown origin.  Chronic back pain.  Multiple sclerosis.  Weakness.  CT CHEST, ABDOMEN AND PELVIS WITH CONTRAST  Technique:  Multidetector CT imaging of the chest, abdomen and pelvis was performed following the standard protocol during bolus administration of intravenous contrast.  Contrast: 1 OMNIPAQUE IOHEXOL 300 MG/ML  SOLN (100 ml)  Comparison:  Multiple exams, including 05/07/2012  CT CHEST  Findings:  Right paratracheal node measures 1.3 cm in short axis, image 17 of series 3.  An AP window lymph node measures 1.6 cm in short axis, image 24 of series 3.  Right hilar adenopathy noted with one right hilar node measuring 1.5 cm in short axis, image 32 of series 3.  Left hilar adenopathy noted with several small calcifications, and also scattered infrahilar nodes.  Conglomerate subcarinal adenopathy measures 2.2 cm in short axis, image 30 of series 3.  Scattered small paraesophageal lymph nodes are present in the lower thorax.  Heart size is within normal limits.  A small hiatal hernia is present.  A 4 mm densely calcified right upper lobe pulmonary nodule on image 14 of series 4 is compatible with calcified granuloma.  Centrilobular emphysema noted.  There is mild  atelectasis or scarring in the posterior basal segments of both lower lobes. Several additional small calcified right upper lobe granulomas are present.  There are several small calcified granulomas in the left lung.  IMPRESSION:  1.  Pathologic mediastinal and hilar adenopathy.  No airway thickening or pleural nodularity to suggest pulmonary parenchymal findings of sarcoidosis.  The  appearance could reflect lymphoma. Tissue diagnosis is likely warranted. 2.  Small hiatal hernia. 3.  Old granulomatous disease.  CT ABDOMEN AND PELVIS  Findings:  Diffuse mild low density in the liver suggests diffuse mild hepatic steatosis.  The spleen, adrenal glands, and pancreas appear unremarkable.  The gallbladder and biliary system appear unremarkable.  Small peripancreatic lymph nodes do not appear pathologically enlarged by size criteria.  The right kidney appears normal.  Several small hypodense lesions in the left kidney are technically too small to characterize although statistically likely represent cysts.  The renal calculi referenced on the prior ultrasound examination from 07/06/2001 are not observed on today's exam. No pathologic pelvic adenopathy is identified.  The appendix is within normal limits.  No significant colonic diverticular disease observed.  No dilated bowel noted.  Urinary bladder appears unremarkable.  Degenerative grade 1 anterolisthesis of L5 on S1 is noted.  IMPRESSION:  1.  The suspected diffuse mild hepatic steatosis. 2.  Several small hypodense lesions in the left kidney are likely cyst but technically too small to characterize. 3.  Degenerative grade 1 anterolisthesis of L5 on S1.  Original Report Authenticated By: Dellia Cloud, M.D.   Mr Laqueta Jean Wo Contrast  05/09/2012  *RADIOLOGY REPORT*  Clinical Data:  51 year old male with lower extremity weakness. History of multiple sclerosis.  MRI HEAD WITHOUT AND WITH CONTRAST MRI CERVICAL SPINE WITHOUT AND WITH CONTRAST  Technique:   Multiplanar, multiecho pulse sequences of the brain and surrounding structures, and cervical spine, to include the craniocervical junction and cervicothoracic junction, were obtained without and with intravenous contrast.  Contrast: 20mL MULTIHANCE GADOBENATE DIMEGLUMINE 529 MG/ML IV SOLN  Comparison:  Thoracic spine study performed the previous day and reported separately.  Brain and cervical MRI 09/15/2010 and earlier.  MRI HEAD  Findings:  Normal cerebral volume. No restricted diffusion to suggest acute infarction.  No midline shift, mass effect, evidence of mass lesion, ventriculomegaly, extra-axial collection or acute intracranial hemorrhage.  Cervicomedullary junction and pituitary are within normal limits.  Major intracranial vascular flow voids are stable, dominant left vertebral artery.  Normal bone marrow signal.  No abnormal enhancement identified.  Chronically advanced T2 and FLAIR hyperintense foci throughout the brain are stable since 2011. Cerebral hemisphere lesions are oriented perpendicular to the lateral ventricles as before, typical in appearance for multiple sclerosis.  There is involvement of the brain stem as before. Cervical spine findings are below.  Orbit soft tissues are within normal limits.  Stable minor paranasal sinus mucosal thickening.  Mastoids remain clear. Negative scalp soft tissues.  IMPRESSION: 1.  Stable MRI appearance of chronic demyelinating disease of the brain.  No enhancement or evidence of acute demyelination. 2.  Cervical spine findings are below. 3.  No new intracranial abnormality.  MRI CERVICAL SPINE  Findings: Stable cervical vertebral height and alignment with preserved lordosis. No marrow edema or evidence of acute osseous abnormality.  Visualized paraspinal soft tissues are within normal limits.  Chronic T2 and STIR hyperintense right lateral spinal cord plaque at C3 is re-identified and not significantly changed.  Elsewhere the cervical spinal cord signal is  within normal limits.  Upper thoracic spinal cord plaque is partially visible on the left at T2 and is also chronic.  No abnormal spinal cord or intradural enhancement to suggest acute demyelination.  Stable chronic predominately mild cervical spine degenerative changes.  There is minimal to mild spinal stenosis at C6-C7 with moderate to severe bilateral foraminal stenosis again noted, related to  disc bulge and ligament flavum hypertrophy.  There is a mild chronic disc bulge at C3-C4 with chronic uncovertebral hypertrophy and mild to moderate to bilateral foraminal stenosis.  IMPRESSION: 1.  Stable chronic demyelinating lesions in the spinal cord at C3 and T2.  No new spinal cord signal abnormality.  No enhancement to suggest acute demyelination. 2.  Stable mild for age cervical degenerative changes.  Original Report Authenticated By: Harley Hallmark, M.D.   Mr Cervical Spine W Wo Contrast  05/09/2012  *RADIOLOGY REPORT*  Clinical Data:  51 year old male with lower extremity weakness. History of multiple sclerosis.  MRI HEAD WITHOUT AND WITH CONTRAST MRI CERVICAL SPINE WITHOUT AND WITH CONTRAST  Technique:  Multiplanar, multiecho pulse sequences of the brain and surrounding structures, and cervical spine, to include the craniocervical junction and cervicothoracic junction, were obtained without and with intravenous contrast.  Contrast: 20mL MULTIHANCE GADOBENATE DIMEGLUMINE 529 MG/ML IV SOLN  Comparison:  Thoracic spine study performed the previous day and reported separately.  Brain and cervical MRI 09/15/2010 and earlier.  MRI HEAD  Findings:  Normal cerebral volume. No restricted diffusion to suggest acute infarction.  No midline shift, mass effect, evidence of mass lesion, ventriculomegaly, extra-axial collection or acute intracranial hemorrhage.  Cervicomedullary junction and pituitary are within normal limits.  Major intracranial vascular flow voids are stable, dominant left vertebral artery.  Normal bone  marrow signal.  No abnormal enhancement identified.  Chronically advanced T2 and FLAIR hyperintense foci throughout the brain are stable since 2011. Cerebral hemisphere lesions are oriented perpendicular to the lateral ventricles as before, typical in appearance for multiple sclerosis.  There is involvement of the brain stem as before. Cervical spine findings are below.  Orbit soft tissues are within normal limits.  Stable minor paranasal sinus mucosal thickening.  Mastoids remain clear. Negative scalp soft tissues.  IMPRESSION: 1.  Stable MRI appearance of chronic demyelinating disease of the brain.  No enhancement or evidence of acute demyelination. 2.  Cervical spine findings are below. 3.  No new intracranial abnormality.  MRI CERVICAL SPINE  Findings: Stable cervical vertebral height and alignment with preserved lordosis. No marrow edema or evidence of acute osseous abnormality.  Visualized paraspinal soft tissues are within normal limits.  Chronic T2 and STIR hyperintense right lateral spinal cord plaque at C3 is re-identified and not significantly changed.  Elsewhere the cervical spinal cord signal is within normal limits.  Upper thoracic spinal cord plaque is partially visible on the left at T2 and is also chronic.  No abnormal spinal cord or intradural enhancement to suggest acute demyelination.  Stable chronic predominately mild cervical spine degenerative changes.  There is minimal to mild spinal stenosis at C6-C7 with moderate to severe bilateral foraminal stenosis again noted, related to disc bulge and ligament flavum hypertrophy.  There is a mild chronic disc bulge at C3-C4 with chronic uncovertebral hypertrophy and mild to moderate to bilateral foraminal stenosis.  IMPRESSION: 1.  Stable chronic demyelinating lesions in the spinal cord at C3 and T2.  No new spinal cord signal abnormality.  No enhancement to suggest acute demyelination. 2.  Stable mild for age cervical degenerative changes.  Original  Report Authenticated By: Harley Hallmark, M.D.   Mr Thoracic Spine W Wo Contrast  05/08/2012  *RADIOLOGY REPORT*  Clinical Data: Lower extremity weakness.  Fever.  Multiple sclerosis.  MRI THORACIC SPINE WITHOUT AND WITH CONTRAST  Technique:  Multiplanar and multiecho pulse sequences of the thoracic spine were obtained without and with intravenous  contrast.  Contrast: 20mL MULTIHANCE GADOBENATE DIMEGLUMINE 529 MG/ML IV SOLN  Comparison: Cervical MRI dated 09/15/2010  Findings: The patient has a subtle plaque in the left posterior lateral aspect of the thoracic spinal cord at T2, unchanged since the cervical MRI dated 09/15/2010.  There are also small focal plaques in the posterior columns at T10 and centrally in the spinal cord at T10-11.  The spinal cord is not enlarged.  There is no pathologic enhancement of the spinal cord or plaques after contrast administration.  The osseous structures of the thoracic spine are normal.  Discs are normal.  No foraminal or spinal stenosis.  The paraspinal soft tissues are normal.  IMPRESSION:  1.  Plaques in the thoracic spinal cord at T2, T10, and T11, most consistent with multiple sclerosis. 2.  No evidence of transverse myelitis or spinal abscess.  Original Report Authenticated By: Gwynn Burly, M.D.   Mr Lumbar Spine W Wo Contrast  05/08/2012  *RADIOLOGY REPORT*  Clinical Data: Lower extremity weakness.  Fever.  Multiple sclerosis.  MRI LUMBAR SPINE WITHOUT AND WITH CONTRAST  Technique:  Multiplanar and multiecho pulse sequences of the lumbar spine were obtained without and with intravenous contrast.  Contrast: 20mL MULTIHANCE GADOBENATE DIMEGLUMINE 529 MG/ML IV SOLN  Comparison: MRI dated 02/25/2012  Findings: Scan extends from T11-12 through S2.  Tip of the conus is at L1-2 and appears normal.  T11-12:  Normal.  T12-L1:  Small disc bulge central and slightly to the right without neural impingement without change.  L1-2:  Small broad-based disc bulge with no neural  impingement, unchanged.  L2-3:  Far lateral disc bulges to the right and left without neural impingement and without change.  L3-4:  Far lateral annular tears of the right and left without neural impingement, unchanged.  Moderate bilateral facet arthritis. Slight narrowing of the spinal canal, unchanged.  L4-5:  Moderate bilateral facet hypertrophy.  Small annular tear to the left.  No neural impingement.  L5 S1:  5 mm spondylolisthesis with slight bulging of the uncovered disc with slight narrowing of the neural foramina, right greater than left, without focal neural impingement, unchanged.  IMPRESSION:   Multilevel degenerative disc disease without focal neural impingement.  No change since the prior exam.  No significant enhancement after contrast administration.  Original Report Authenticated By: Gwynn Burly, M.D.   Ct Abdomen Pelvis W Contrast  05/12/2012  *RADIOLOGY REPORT*  Clinical Data:  Fever unknown origin.  Chronic back pain.  Multiple sclerosis.  Weakness.  CT CHEST, ABDOMEN AND PELVIS WITH CONTRAST  Technique:  Multidetector CT imaging of the chest, abdomen and pelvis was performed following the standard protocol during bolus administration of intravenous contrast.  Contrast: 1 OMNIPAQUE IOHEXOL 300 MG/ML  SOLN (100 ml)  Comparison:  Multiple exams, including 05/07/2012  CT CHEST  Findings:  Right paratracheal node measures 1.3 cm in short axis, image 17 of series 3.  An AP window lymph node measures 1.6 cm in short axis, image 24 of series 3.  Right hilar adenopathy noted with one right hilar node measuring 1.5 cm in short axis, image 32 of series 3.  Left hilar adenopathy noted with several small calcifications, and also scattered infrahilar nodes.  Conglomerate subcarinal adenopathy measures 2.2 cm in short axis, image 30 of series 3.  Scattered small paraesophageal lymph nodes are present in the lower thorax.  Heart size is within normal limits.  A small hiatal hernia is present.  A 4 mm  densely  calcified right upper lobe pulmonary nodule on image 14 of series 4 is compatible with calcified granuloma.  Centrilobular emphysema noted.  There is mild atelectasis or scarring in the posterior basal segments of both lower lobes. Several additional small calcified right upper lobe granulomas are present.  There are several small calcified granulomas in the left lung.  IMPRESSION:  1.  Pathologic mediastinal and hilar adenopathy.  No airway thickening or pleural nodularity to suggest pulmonary parenchymal findings of sarcoidosis.  The appearance could reflect lymphoma. Tissue diagnosis is likely warranted. 2.  Small hiatal hernia. 3.  Old granulomatous disease.  CT ABDOMEN AND PELVIS  Findings:  Diffuse mild low density in the liver suggests diffuse mild hepatic steatosis.  The spleen, adrenal glands, and pancreas appear unremarkable.  The gallbladder and biliary system appear unremarkable.  Small peripancreatic lymph nodes do not appear pathologically enlarged by size criteria.  The right kidney appears normal.  Several small hypodense lesions in the left kidney are technically too small to characterize although statistically likely represent cysts.  The renal calculi referenced on the prior ultrasound examination from 07/06/2001 are not observed on today's exam. No pathologic pelvic adenopathy is identified.  The appendix is within normal limits.  No significant colonic diverticular disease observed.  No dilated bowel noted.  Urinary bladder appears unremarkable.  Degenerative grade 1 anterolisthesis of L5 on S1 is noted.  IMPRESSION:  1.  The suspected diffuse mild hepatic steatosis. 2.  Several small hypodense lesions in the left kidney are likely cyst but technically too small to characterize. 3.  Degenerative grade 1 anterolisthesis of L5 on S1.  Original Report Authenticated By: Dellia Cloud, M.D.   US Venous Img Lower Bilateral  05/08/2012  *RADIOLOGY REPORT*  Clinical Data: Bilateral leg  weakness and hip pain, history of smoking, evaluate for DVT  BILATERAL LOWER EXTREMITY VENOUS DUPLEX ULTRASOUND  Technique:  Gray-scale sonography with graded compression, as well as color Doppler and duplex ultrasound, were performed to evaluate the deep venous system of both lower extremities from the level of the common femoral vein through the popliteal and proximal calf veins.  Spectral Doppler was utilized to evaluate flow at rest and with distal augmentation maneuvers.  Comparison:  None.  Findings:  Normal compressibility of bilateral common femoral, superficial femoral, and popliteal veins is demonstrated, as well as the visualized proximal calf veins.  No filling defects to suggest DVT on grayscale or color Doppler imaging.  Doppler waveforms show normal direction of venous flow, normal respiratory phasicity and response to augmentation.  IMPRESSION: No evidence of deep vein thrombosis, within either lower extremity.  Original Report Authenticated By: Waynard Reeds, M.D.   Dg Chest Port 1 View  05/07/2012  *RADIOLOGY REPORT*  Clinical Data: Fever.  Infiltrate.  PORTABLE CHEST - 1 VIEW  Comparison: Multiple priors most recently 05/17/2012 at 1535 hours. Chest CT 01/13/2007.  Findings: Left costophrenic angle excluded from view.  Left basilar subsegmental atelectasis.  Prominent right pericardial fat pad. Cardiopericardial silhouette is within normal limits.  No airspace disease.  No effusion.  Calcified granuloma in the right upper lobe and punctate calcifications of the mediastinal lymph nodes again noted compatible with old granulomatous disease.  IMPRESSION: No acute cardiopulmonary disease.  Subsegmental left basilar atelectasis.  Original Report Authenticated By: Andreas Newport, M.D.   Dg Chest Port 1 View  05/07/2012  *RADIOLOGY REPORT*  Clinical Data: Weakness.  Fall.  Multiple sclerosis.  Smoker.  PORTABLE CHEST - 1 VIEW  Comparison: 06/14/2011 chest radiograph.  CT chest 01/13/2007  Findings:  The normal heart size and pulmonary vascularity. Calcified granulomas in the mid lungs.  Nodular hilar opacities consistent with prominent lymph nodes and granulomas as seen on previous chest CT.  Mild emphysematous changes in the upper lungs. No focal airspace consolidation.  No blunting of costophrenic angles.  No pneumothorax.  Old left rib fracture.  IMPRESSION: Calcified granulomas with calcified hilar lymph nodes. Emphysematous changes.  No evidence of active pulmonary disease.  Original Report Authenticated By: Marlon Pel, M.D.   Dg Fluoro Guide Lumbar Puncture  05/11/2012  *RADIOLOGY REPORT*  Clinical Data: Multiple Sclerosis, fever and headaches.  DIAGNOSTIC LUMBAR PUNCTURE UNDER FLUOROSCOPIC GUIDANCE  Fluoroscopy time:  1.8. minutes.  Technique:  Informed consent was obtained from the patient prior to the procedure, including potential complications of headache, allergy, and pain. Lovenox was withheld for 1.5 days.  With the patient prone oblique, the lower back was prepped with Betadine. 1% Lidocaine was used for local anesthesia.  Lumbar puncture was performed at the L4-L5 level using a 12.7 cm 20  gauge needle with return of blood tinged CSF with an opening pressure of 19.2. cm water.   Only three ml of CSF were obtained for laboratory studies, even after placing the  patient in reverse Trendelenburg position. The patient tolerated the procedure well and there were no apparent complications.  IMPRESSION: Slightly elevated opening pressure of 19.2 cm water.  Approximately 3 ml of blood-tinged CSF was obtained for pathological analysis.  Original Report Authenticated By: Brandon Melnick, M.D.    Scheduled Meds:    . aspirin EC  81 mg Oral Daily  . carbamazepine  200 mg Oral TID  . Chlorhexidine Gluconate Cloth  6 each Topical Q0600  . cholecalciferol  2,000 Units Oral BH-q7a  . docusate sodium  100 mg Oral BID  . gabapentin  600 mg Oral TID  . glatiramer  20 mg Subcutaneous Daily  .  HYDROmorphone      . ibuprofen  600 mg Oral TID  . insulin aspart  0-15 Units Subcutaneous TID WC  . insulin aspart  0-5 Units Subcutaneous QHS  . losartan  100 mg Oral Daily  . pantoprazole  40 mg Oral BID AC  . piperacillin-tazobactam (ZOSYN)  IV  3.375 g Intravenous Q8H  . sodium chloride  3 mL Intravenous Q12H  . vancomycin  1,250 mg Intravenous Q8H   Continuous Infusions:    . sodium chloride 100 mL/hr at 05/15/12 1610    Lambert Keto, MD  [facility] Pager [phone]  If 7PM-7AM, please contact night-coverage [email or web address] Password [facility] 05/15/2012, 12:31 PM

## 2012-05-16 ENCOUNTER — Encounter (HOSPITAL_COMMUNITY): Payer: Self-pay | Admitting: Thoracic Surgery (Cardiothoracic Vascular Surgery)

## 2012-05-16 DIAGNOSIS — R509 Fever, unspecified: Secondary | ICD-10-CM

## 2012-05-16 DIAGNOSIS — G35 Multiple sclerosis: Secondary | ICD-10-CM

## 2012-05-16 LAB — RENAL FUNCTION PANEL
Albumin: 2.5 g/dL — ABNORMAL LOW (ref 3.5–5.2)
BUN: 10 mg/dL (ref 6–23)
Chloride: 100 mEq/L (ref 96–112)
Creatinine, Ser: 0.81 mg/dL (ref 0.50–1.35)

## 2012-05-16 LAB — GLUCOSE, CAPILLARY: Glucose-Capillary: 115 mg/dL — ABNORMAL HIGH (ref 70–99)

## 2012-05-16 MED ORDER — CHLORHEXIDINE GLUCONATE CLOTH 2 % EX PADS: 6.0000 | MEDICATED_PAD | Freq: Once | CUTANEOUS | Status: DC

## 2012-05-16 NOTE — Progress Notes (Signed)
PT started having some extreme pain in his hips.PT started having spasm. Pt was given 2 mg of dilaudid IV and 2 norcos PO. Pt has these episodes around the same time every morning. RN will continue to monitor pt

## 2012-05-16 NOTE — Progress Notes (Addendum)
1 Day Post-Op Procedure(s) (LRB): MEDIASTINOSCOPY (N/A)  Subjective: Patient with complaints of some shortness of breath with exertion.  Objective: Vital signs in last 24 hours: Patient Vitals for the past 24 hrs:  BP Temp Temp src Pulse Resp SpO2  05/16/12 0534 142/76 mmHg 99.1 F (37.3 C) Oral 107  19  93 %  05/15/12 2100 112/71 mmHg 97.8 F (36.6 C) Oral 83  18  93 %  05/15/12 1720 142/82 mmHg 101.4 F (38.6 C) Oral 110  18  93 %  05/15/12 1538 162/93 mmHg 97.4 F (36.3 C) Oral 112  24  91 %  05/15/12 1355 134/82 mmHg 98.2 F (36.8 C) Oral 89  20  94 %  05/15/12 1210 111/74 mmHg 97.3 F (36.3 C) Oral 85  18  93 %  05/15/12 1130 115/63 mmHg - - 84  15  94 %  05/15/12 1115 112/56 mmHg - - 87  14  91 %  05/15/12 1100 105/57 mmHg - - 92  16  92 %  05/15/12 1045 98/60 mmHg - - 93  16  92 %      Intake/Output from previous day: 08/12 0701 - 08/13 0700 In: 4545 [I.V.:4545] Out: 845 [Urine:820; Blood:25]   Physical Exam:  Cardiovascular: RRR Pulmonary: Clear to auscultation bilaterally; no rales, wheezes, or rhonchi. Abdomen: Soft, non tender, bowel sounds present. Extremities:SCDs in place Wound: Clean and dry.  No erythema or signs of infection.   Lab Results: CBC:No results found for this basename: WBC:2,HGB:2,HCT:2,PLT:2 in the last 72 hours BMET: No results found for this basename: NA:2,K:2,CL:2,CO2:2,GLUCOSE:2,BUN:2,CREATININE:2,CALCIUM:2 in the last 72 hours  PT/INR:  Basename 05/14/12 1841  LABPROT 15.2  INR 1.18   ABG:  INR: Will add last result for INR, ABG once components are confirmed Will add last 4 CBG results once components are confirmed  Assessment/Plan:  1.S/p mediastinoscopy-Pathology sowed negative for malignancy 2.Fever of unknown origin. 3.Management per medicine.  ZIMMERMAN,DONIELLE MPA-C 05/16/2012   I have seen and examined the patient and agree with the assessment and plan as outlined.  Britten Seyfried H 05/16/2012 1:37 PM

## 2012-05-16 NOTE — Progress Notes (Signed)
[facility] PROGRESS NOTE  Chad Vasquez ZOX:[id number] DOB: August 04, 1961 DOA: 05/07/2012   Assessment/Plan: Patient Active Hospital Problem List: Multiple sclerosis exacerbation (05/07/2012) -completed a five-day course of Solu-Medrol.  -CIR consult  Fever (05/07/2012) -Empiric treatment with vancomycin zosyn stop, completed a 8 day course. -Improved with ibuprofen. -A CT scan of the chest concerning for lymphadenopathy, HIV negative. -Cardiothoracic s/p biopsy 8/12 negative lymphoma. Negative cultures till date. -Blood cultures x2 continue to be negative CSF cultures are negative urine cultures are negative. -TSH is within normal limits. -Check ANA, RF, he relates no joint pain or effusion. No rashes. -no new drugs that I know will cause fever.  HTN (hypertension) (05/07/2012) -blood pressure borderline high.  -Resume her medication.   Chronic pain (05/07/2012) -continue narcotics and ibuprofen. Get physical therapy. -start IV narcotics.  Polysubstance abuse (05/07/2012) -counseling.  Code Status: Full code Family Communication: [phone] Disposition Plan: To determine     LOS: 9 days   Procedures:    Antibiotics:  Vancomycin and zosyn completed a 8 days course   Subjective: Still having fevers.  Objective: Filed Vitals:   05/15/12 1720 05/15/12 2100 05/16/12 0534 05/16/12 1158  BP: 142/82 112/71 142/76   Pulse: 110 83 107   Temp: 101.4 F (38.6 C) 97.8 F (36.6 C) 99.1 F (37.3 C)   TempSrc: Oral Oral Oral   Resp: 18 18 19    Height:      Weight:      SpO2: 93% 93% 93% 89%    Intake/Output Summary (Last 24 hours) at 05/16/12 1200 Last data filed at 05/16/12 0559  Gross per 24 hour  Intake   2345 ml  Output    820 ml  Net   1525 ml   Weight change:   Exam:  General: Alert, awake, oriented x3, in no acute distress.  HEENT: No bruits, no goiter.  Scar in neck looks clean. Heart: Regular rate and rhythm, without murmurs, rubs, gallops.    Lungs: Good air movement, bilateral air movement.    Data Reviewed: Basic Metabolic Panel:  Lab 05/13/12 5621 05/12/12 0513  NA 140 138  K 4.2 3.9  CL 105 103  CO2 21 24  GLUCOSE 98 324*  BUN 16 19  CREATININE 0.85 0.95  CALCIUM 8.7 8.8  MG -- --  PHOS -- --   Liver Function Tests:  Lab 05/13/12 0612 05/12/12 0513  AST 56* 53*  ALT 203* 229*  ALKPHOS 84 92  BILITOT 0.4 0.3  PROT 6.4 6.3  ALBUMIN 2.8* 2.8*   No results found for this basename: LIPASE:5,AMYLASE:5 in the last 168 hours No results found for this basename: AMMONIA:5 in the last 168 hours CBC:  Lab 05/13/12 0612 05/12/12 0513  WBC 9.9 11.5*  NEUTROABS -- --  HGB 13.1 12.2*  HCT 38.4* 34.9*  MCV 91.4 91.6  PLT 190 187   Cardiac Enzymes: No results found for this basename: CKTOTAL:5,CKMB:5,CKMBINDEX:5,TROPONINI:5 in the last 168 hours BNP: No components found with this basename: POCBNP:5 CBG:  Lab 05/16/12 1129 05/16/12 0742 05/15/12 2154 05/15/12 1715 05/15/12 1237  GLUCAP 115* 104* 132* 93 118*    Recent Results (from the past 240 hour(s))  CULTURE, BLOOD (ROUTINE X 2)     Status: Normal   Collection Time   05/07/12  5:45 PM      Component Value Range Status Comment   Specimen Description BLOOD LEFT ARM   Final    Special Requests BOTTLES DRAWN AEROBIC AND ANAEROBIC 4CC  Final    Culture NO GROWTH 5 DAYS   Final    Report Status 05/12/2012 FINAL   Final   URINE CULTURE     Status: Normal   Collection Time   05/07/12  5:55 PM      Component Value Range Status Comment   Specimen Description URINE, CATHETERIZED   Final    Special Requests NONE   Final    Culture  Setup Time 05/07/2012 21:53   Final    Colony Count NO GROWTH   Final    Culture NO GROWTH   Final    Report Status 05/09/2012 FINAL   Final   CULTURE, BLOOD (ROUTINE X 2)     Status: Normal   Collection Time   05/07/12  6:00 PM      Component Value Range Status Comment   Specimen Description BLOOD RIGHT ARM   Final    Special  Requests BOTTLES DRAWN AEROBIC AND ANAEROBIC 6CC   Final    Culture NO GROWTH 5 DAYS   Final    Report Status 05/12/2012 FINAL   Final   CULTURE, BLOOD (ROUTINE X 2)     Status: Normal   Collection Time   05/10/12  5:05 PM      Component Value Range Status Comment   Specimen Description BLOOD LEFT HAND   Final    Special Requests BOTTLES DRAWN AEROBIC AND ANAEROBIC 12CC   Final    Culture NO GROWTH 5 DAYS   Final    Report Status 05/15/2012 FINAL   Final   CULTURE, BLOOD (ROUTINE X 2)     Status: Normal   Collection Time   05/10/12  5:10 PM      Component Value Range Status Comment   Specimen Description BLOOD RIGHT ANTECUBITAL   Final    Special Requests BOTTLES DRAWN AEROBIC AND ANAEROBIC 12CC   Final    Culture NO GROWTH 5 DAYS   Final    Report Status 05/15/2012 FINAL   Final   TISSUE CULTURE     Status: Normal (Preliminary result)   Collection Time   05/15/12  7:30 AM      Component Value Range Status Comment   Specimen Description TISSUE LYMPH NODE   Final    Special Requests NO 2/MEDIASTINAL   Final    Gram Stain     Final    Value: FEW WBC PRESENT,BOTH PMN AND MONONUCLEAR     NO SQUAMOUS EPITHELIAL CELLS SEEN     NO ORGANISMS SEEN   Culture NO GROWTH   Final    Report Status PENDING   Incomplete      Studies: Ct Chest W Contrast  05/12/2012  *RADIOLOGY REPORT*  Clinical Data:  Fever unknown origin.  Chronic back pain.  Multiple sclerosis.  Weakness.  CT CHEST, ABDOMEN AND PELVIS WITH CONTRAST  Technique:  Multidetector CT imaging of the chest, abdomen and pelvis was performed following the standard protocol during bolus administration of intravenous contrast.  Contrast: 1 OMNIPAQUE IOHEXOL 300 MG/ML  SOLN (100 ml)  Comparison:  Multiple exams, including 05/07/2012  CT CHEST  Findings:  Right paratracheal node measures 1.3 cm in short axis, image 17 of series 3.  An AP window lymph node measures 1.6 cm in short axis, image 24 of series 3.  Right hilar adenopathy noted with one  right hilar node measuring 1.5 cm in short axis, image 32 of series 3.  Left hilar adenopathy noted with several small calcifications,  and also scattered infrahilar nodes.  Conglomerate subcarinal adenopathy measures 2.2 cm in short axis, image 30 of series 3.  Scattered small paraesophageal lymph nodes are present in the lower thorax.  Heart size is within normal limits.  A small hiatal hernia is present.  A 4 mm densely calcified right upper lobe pulmonary nodule on image 14 of series 4 is compatible with calcified granuloma.  Centrilobular emphysema noted.  There is mild atelectasis or scarring in the posterior basal segments of both lower lobes. Several additional small calcified right upper lobe granulomas are present.  There are several small calcified granulomas in the left lung.  IMPRESSION:  1.  Pathologic mediastinal and hilar adenopathy.  No airway thickening or pleural nodularity to suggest pulmonary parenchymal findings of sarcoidosis.  The appearance could reflect lymphoma. Tissue diagnosis is likely warranted. 2.  Small hiatal hernia. 3.  Old granulomatous disease.  CT ABDOMEN AND PELVIS  Findings:  Diffuse mild low density in the liver suggests diffuse mild hepatic steatosis.  The spleen, adrenal glands, and pancreas appear unremarkable.  The gallbladder and biliary system appear unremarkable.  Small peripancreatic lymph nodes do not appear pathologically enlarged by size criteria.  The right kidney appears normal.  Several small hypodense lesions in the left kidney are technically too small to characterize although statistically likely represent cysts.  The renal calculi referenced on the prior ultrasound examination from 07/06/2001 are not observed on today's exam. No pathologic pelvic adenopathy is identified.  The appendix is within normal limits.  No significant colonic diverticular disease observed.  No dilated bowel noted.  Urinary bladder appears unremarkable.  Degenerative grade 1  anterolisthesis of L5 on S1 is noted.  IMPRESSION:  1.  The suspected diffuse mild hepatic steatosis. 2.  Several small hypodense lesions in the left kidney are likely cyst but technically too small to characterize. 3.  Degenerative grade 1 anterolisthesis of L5 on S1.  Original Report Authenticated By: Dellia Cloud, M.D.   Mr Laqueta Jean Wo Contrast  05/09/2012  *RADIOLOGY REPORT*  Clinical Data:  51 year old male with lower extremity weakness. History of multiple sclerosis.  MRI HEAD WITHOUT AND WITH CONTRAST MRI CERVICAL SPINE WITHOUT AND WITH CONTRAST  Technique:  Multiplanar, multiecho pulse sequences of the brain and surrounding structures, and cervical spine, to include the craniocervical junction and cervicothoracic junction, were obtained without and with intravenous contrast.  Contrast: 20mL MULTIHANCE GADOBENATE DIMEGLUMINE 529 MG/ML IV SOLN  Comparison:  Thoracic spine study performed the previous day and reported separately.  Brain and cervical MRI 09/15/2010 and earlier.  MRI HEAD  Findings:  Normal cerebral volume. No restricted diffusion to suggest acute infarction.  No midline shift, mass effect, evidence of mass lesion, ventriculomegaly, extra-axial collection or acute intracranial hemorrhage.  Cervicomedullary junction and pituitary are within normal limits.  Major intracranial vascular flow voids are stable, dominant left vertebral artery.  Normal bone marrow signal.  No abnormal enhancement identified.  Chronically advanced T2 and FLAIR hyperintense foci throughout the brain are stable since 2011. Cerebral hemisphere lesions are oriented perpendicular to the lateral ventricles as before, typical in appearance for multiple sclerosis.  There is involvement of the brain stem as before. Cervical spine findings are below.  Orbit soft tissues are within normal limits.  Stable minor paranasal sinus mucosal thickening.  Mastoids remain clear. Negative scalp soft tissues.  IMPRESSION: 1.  Stable  MRI appearance of chronic demyelinating disease of the brain.  No enhancement or evidence of acute demyelination. 2.  Cervical spine findings are below. 3.  No new intracranial abnormality.  MRI CERVICAL SPINE  Findings: Stable cervical vertebral height and alignment with preserved lordosis. No marrow edema or evidence of acute osseous abnormality.  Visualized paraspinal soft tissues are within normal limits.  Chronic T2 and STIR hyperintense right lateral spinal cord plaque at C3 is re-identified and not significantly changed.  Elsewhere the cervical spinal cord signal is within normal limits.  Upper thoracic spinal cord plaque is partially visible on the left at T2 and is also chronic.  No abnormal spinal cord or intradural enhancement to suggest acute demyelination.  Stable chronic predominately mild cervical spine degenerative changes.  There is minimal to mild spinal stenosis at C6-C7 with moderate to severe bilateral foraminal stenosis again noted, related to disc bulge and ligament flavum hypertrophy.  There is a mild chronic disc bulge at C3-C4 with chronic uncovertebral hypertrophy and mild to moderate to bilateral foraminal stenosis.  IMPRESSION: 1.  Stable chronic demyelinating lesions in the spinal cord at C3 and T2.  No new spinal cord signal abnormality.  No enhancement to suggest acute demyelination. 2.  Stable mild for age cervical degenerative changes.  Original Report Authenticated By: Harley Hallmark, M.D.   Mr Cervical Spine W Wo Contrast  05/09/2012  *RADIOLOGY REPORT*  Clinical Data:  51 year old male with lower extremity weakness. History of multiple sclerosis.  MRI HEAD WITHOUT AND WITH CONTRAST MRI CERVICAL SPINE WITHOUT AND WITH CONTRAST  Technique:  Multiplanar, multiecho pulse sequences of the brain and surrounding structures, and cervical spine, to include the craniocervical junction and cervicothoracic junction, were obtained without and with intravenous contrast.  Contrast: 20mL  MULTIHANCE GADOBENATE DIMEGLUMINE 529 MG/ML IV SOLN  Comparison:  Thoracic spine study performed the previous day and reported separately.  Brain and cervical MRI 09/15/2010 and earlier.  MRI HEAD  Findings:  Normal cerebral volume. No restricted diffusion to suggest acute infarction.  No midline shift, mass effect, evidence of mass lesion, ventriculomegaly, extra-axial collection or acute intracranial hemorrhage.  Cervicomedullary junction and pituitary are within normal limits.  Major intracranial vascular flow voids are stable, dominant left vertebral artery.  Normal bone marrow signal.  No abnormal enhancement identified.  Chronically advanced T2 and FLAIR hyperintense foci throughout the brain are stable since 2011. Cerebral hemisphere lesions are oriented perpendicular to the lateral ventricles as before, typical in appearance for multiple sclerosis.  There is involvement of the brain stem as before. Cervical spine findings are below.  Orbit soft tissues are within normal limits.  Stable minor paranasal sinus mucosal thickening.  Mastoids remain clear. Negative scalp soft tissues.  IMPRESSION: 1.  Stable MRI appearance of chronic demyelinating disease of the brain.  No enhancement or evidence of acute demyelination. 2.  Cervical spine findings are below. 3.  No new intracranial abnormality.  MRI CERVICAL SPINE  Findings: Stable cervical vertebral height and alignment with preserved lordosis. No marrow edema or evidence of acute osseous abnormality.  Visualized paraspinal soft tissues are within normal limits.  Chronic T2 and STIR hyperintense right lateral spinal cord plaque at C3 is re-identified and not significantly changed.  Elsewhere the cervical spinal cord signal is within normal limits.  Upper thoracic spinal cord plaque is partially visible on the left at T2 and is also chronic.  No abnormal spinal cord or intradural enhancement to suggest acute demyelination.  Stable chronic predominately mild  cervical spine degenerative changes.  There is minimal to mild spinal stenosis at C6-C7 with moderate to  severe bilateral foraminal stenosis again noted, related to disc bulge and ligament flavum hypertrophy.  There is a mild chronic disc bulge at C3-C4 with chronic uncovertebral hypertrophy and mild to moderate to bilateral foraminal stenosis.  IMPRESSION: 1.  Stable chronic demyelinating lesions in the spinal cord at C3 and T2.  No new spinal cord signal abnormality.  No enhancement to suggest acute demyelination. 2.  Stable mild for age cervical degenerative changes.  Original Report Authenticated By: Harley Hallmark, M.D.   Mr Thoracic Spine W Wo Contrast  05/08/2012  *RADIOLOGY REPORT*  Clinical Data: Lower extremity weakness.  Fever.  Multiple sclerosis.  MRI THORACIC SPINE WITHOUT AND WITH CONTRAST  Technique:  Multiplanar and multiecho pulse sequences of the thoracic spine were obtained without and with intravenous contrast.  Contrast: 20mL MULTIHANCE GADOBENATE DIMEGLUMINE 529 MG/ML IV SOLN  Comparison: Cervical MRI dated 09/15/2010  Findings: The patient has a subtle plaque in the left posterior lateral aspect of the thoracic spinal cord at T2, unchanged since the cervical MRI dated 09/15/2010.  There are also small focal plaques in the posterior columns at T10 and centrally in the spinal cord at T10-11.  The spinal cord is not enlarged.  There is no pathologic enhancement of the spinal cord or plaques after contrast administration.  The osseous structures of the thoracic spine are normal.  Discs are normal.  No foraminal or spinal stenosis.  The paraspinal soft tissues are normal.  IMPRESSION:  1.  Plaques in the thoracic spinal cord at T2, T10, and T11, most consistent with multiple sclerosis. 2.  No evidence of transverse myelitis or spinal abscess.  Original Report Authenticated By: Gwynn Burly, M.D.   Mr Lumbar Spine W Wo Contrast  05/08/2012  *RADIOLOGY REPORT*  Clinical Data: Lower extremity  weakness.  Fever.  Multiple sclerosis.  MRI LUMBAR SPINE WITHOUT AND WITH CONTRAST  Technique:  Multiplanar and multiecho pulse sequences of the lumbar spine were obtained without and with intravenous contrast.  Contrast: 20mL MULTIHANCE GADOBENATE DIMEGLUMINE 529 MG/ML IV SOLN  Comparison: MRI dated 02/25/2012  Findings: Scan extends from T11-12 through S2.  Tip of the conus is at L1-2 and appears normal.  T11-12:  Normal.  T12-L1:  Small disc bulge central and slightly to the right without neural impingement without change.  L1-2:  Small broad-based disc bulge with no neural impingement, unchanged.  L2-3:  Far lateral disc bulges to the right and left without neural impingement and without change.  L3-4:  Far lateral annular tears of the right and left without neural impingement, unchanged.  Moderate bilateral facet arthritis. Slight narrowing of the spinal canal, unchanged.  L4-5:  Moderate bilateral facet hypertrophy.  Small annular tear to the left.  No neural impingement.  L5 S1:  5 mm spondylolisthesis with slight bulging of the uncovered disc with slight narrowing of the neural foramina, right greater than left, without focal neural impingement, unchanged.  IMPRESSION:   Multilevel degenerative disc disease without focal neural impingement.  No change since the prior exam.  No significant enhancement after contrast administration.  Original Report Authenticated By: Gwynn Burly, M.D.   Ct Abdomen Pelvis W Contrast  05/12/2012  *RADIOLOGY REPORT*  Clinical Data:  Fever unknown origin.  Chronic back pain.  Multiple sclerosis.  Weakness.  CT CHEST, ABDOMEN AND PELVIS WITH CONTRAST  Technique:  Multidetector CT imaging of the chest, abdomen and pelvis was performed following the standard protocol during bolus administration of intravenous contrast.  Contrast: 1  OMNIPAQUE IOHEXOL 300 MG/ML  SOLN (100 ml)  Comparison:  Multiple exams, including 05/07/2012  CT CHEST  Findings:  Right paratracheal node  measures 1.3 cm in short axis, image 17 of series 3.  An AP window lymph node measures 1.6 cm in short axis, image 24 of series 3.  Right hilar adenopathy noted with one right hilar node measuring 1.5 cm in short axis, image 32 of series 3.  Left hilar adenopathy noted with several small calcifications, and also scattered infrahilar nodes.  Conglomerate subcarinal adenopathy measures 2.2 cm in short axis, image 30 of series 3.  Scattered small paraesophageal lymph nodes are present in the lower thorax.  Heart size is within normal limits.  A small hiatal hernia is present.  A 4 mm densely calcified right upper lobe pulmonary nodule on image 14 of series 4 is compatible with calcified granuloma.  Centrilobular emphysema noted.  There is mild atelectasis or scarring in the posterior basal segments of both lower lobes. Several additional small calcified right upper lobe granulomas are present.  There are several small calcified granulomas in the left lung.  IMPRESSION:  1.  Pathologic mediastinal and hilar adenopathy.  No airway thickening or pleural nodularity to suggest pulmonary parenchymal findings of sarcoidosis.  The appearance could reflect lymphoma. Tissue diagnosis is likely warranted. 2.  Small hiatal hernia. 3.  Old granulomatous disease.  CT ABDOMEN AND PELVIS  Findings:  Diffuse mild low density in the liver suggests diffuse mild hepatic steatosis.  The spleen, adrenal glands, and pancreas appear unremarkable.  The gallbladder and biliary system appear unremarkable.  Small peripancreatic lymph nodes do not appear pathologically enlarged by size criteria.  The right kidney appears normal.  Several small hypodense lesions in the left kidney are technically too small to characterize although statistically likely represent cysts.  The renal calculi referenced on the prior ultrasound examination from 07/06/2001 are not observed on today's exam. No pathologic pelvic adenopathy is identified.  The appendix is  within normal limits.  No significant colonic diverticular disease observed.  No dilated bowel noted.  Urinary bladder appears unremarkable.  Degenerative grade 1 anterolisthesis of L5 on S1 is noted.  IMPRESSION:  1.  The suspected diffuse mild hepatic steatosis. 2.  Several small hypodense lesions in the left kidney are likely cyst but technically too small to characterize. 3.  Degenerative grade 1 anterolisthesis of L5 on S1.  Original Report Authenticated By: Dellia Cloud, M.D.   US Venous Img Lower Bilateral  05/08/2012  *RADIOLOGY REPORT*  Clinical Data: Bilateral leg weakness and hip pain, history of smoking, evaluate for DVT  BILATERAL LOWER EXTREMITY VENOUS DUPLEX ULTRASOUND  Technique:  Gray-scale sonography with graded compression, as well as color Doppler and duplex ultrasound, were performed to evaluate the deep venous system of both lower extremities from the level of the common femoral vein through the popliteal and proximal calf veins.  Spectral Doppler was utilized to evaluate flow at rest and with distal augmentation maneuvers.  Comparison:  None.  Findings:  Normal compressibility of bilateral common femoral, superficial femoral, and popliteal veins is demonstrated, as well as the visualized proximal calf veins.  No filling defects to suggest DVT on grayscale or color Doppler imaging.  Doppler waveforms show normal direction of venous flow, normal respiratory phasicity and response to augmentation.  IMPRESSION: No evidence of deep vein thrombosis, within either lower extremity.  Original Report Authenticated By: Waynard Reeds, M.D.   Dg Chest [facility]  05/07/2012  *RADIOLOGY REPORT*  Clinical Data: Fever.  Infiltrate.  PORTABLE CHEST - 1 VIEW  Comparison: Multiple priors most recently 05/17/2012 at 1535 hours. Chest CT 01/13/2007.  Findings: Left costophrenic angle excluded from view.  Left basilar subsegmental atelectasis.  Prominent right pericardial fat pad. Cardiopericardial  silhouette is within normal limits.  No airspace disease.  No effusion.  Calcified granuloma in the right upper lobe and punctate calcifications of the mediastinal lymph nodes again noted compatible with old granulomatous disease.  IMPRESSION: No acute cardiopulmonary disease.  Subsegmental left basilar atelectasis.  Original Report Authenticated By: Andreas Newport, M.D.   Dg Chest Port 1 View  05/07/2012  *RADIOLOGY REPORT*  Clinical Data: Weakness.  Fall.  Multiple sclerosis.  Smoker.  PORTABLE CHEST - 1 VIEW  Comparison: 06/14/2011 chest radiograph.  CT chest 01/13/2007  Findings: The normal heart size and pulmonary vascularity. Calcified granulomas in the mid lungs.  Nodular hilar opacities consistent with prominent lymph nodes and granulomas as seen on previous chest CT.  Mild emphysematous changes in the upper lungs. No focal airspace consolidation.  No blunting of costophrenic angles.  No pneumothorax.  Old left rib fracture.  IMPRESSION: Calcified granulomas with calcified hilar lymph nodes. Emphysematous changes.  No evidence of active pulmonary disease.  Original Report Authenticated By: Marlon Pel, M.D.   Dg Fluoro Guide Lumbar Puncture  05/11/2012  *RADIOLOGY REPORT*  Clinical Data: Multiple Sclerosis, fever and headaches.  DIAGNOSTIC LUMBAR PUNCTURE UNDER FLUOROSCOPIC GUIDANCE  Fluoroscopy time:  1.8. minutes.  Technique:  Informed consent was obtained from the patient prior to the procedure, including potential complications of headache, allergy, and pain. Lovenox was withheld for 1.5 days.  With the patient prone oblique, the lower back was prepped with Betadine. 1% Lidocaine was used for local anesthesia.  Lumbar puncture was performed at the L4-L5 level using a 12.7 cm 20  gauge needle with return of blood tinged CSF with an opening pressure of 19.2. cm water.   Only three ml of CSF were obtained for laboratory studies, even after placing the  patient in reverse Trendelenburg position.  The patient tolerated the procedure well and there were no apparent complications.  IMPRESSION: Slightly elevated opening pressure of 19.2 cm water.  Approximately 3 ml of blood-tinged CSF was obtained for pathological analysis.  Original Report Authenticated By: Brandon Melnick, M.D.    Scheduled Meds:    . aspirin EC  81 mg Oral Daily  . carbamazepine  200 mg Oral TID  . Chlorhexidine Gluconate Cloth  6 each Topical Once  . cholecalciferol  2,000 Units Oral [id number]  . docusate sodium  100 mg Oral BID  . gabapentin  600 mg Oral TID  . glatiramer  20 mg Subcutaneous Daily  . HYDROmorphone      . ibuprofen  600 mg Oral TID  . insulin aspart  0-15 Units Subcutaneous TID WC  . insulin aspart  0-5 Units Subcutaneous QHS  . losartan  100 mg Oral Daily  . pantoprazole  40 mg Oral BID AC  . sodium chloride  3 mL Intravenous Q12H  . DISCONTD: Chlorhexidine Gluconate Cloth  6 each Topical Q0600  . DISCONTD: piperacillin-tazobactam (ZOSYN)  IV  3.375 g Intravenous Q8H  . DISCONTD: vancomycin  1,250 mg Intravenous Q8H   Continuous Infusions:    . sodium chloride 100 mL/hr at 05/16/12 0559    Lambert Keto, MD  [facility] Pager [phone]  If 7PM-7AM, please contact night-coverage [email or web address] Password [email or web address]  05/16/2012, 12:00 PM

## 2012-05-16 NOTE — Op Note (Signed)
NAMEAVORY, MIMBS NO.:  [id number]  MEDICAL RECORD NO.:  [id number]  LOCATION:  5528                         FACILITY:  [facility]  PHYSICIAN:  Salvatore Decent. Dorris Fetch, M.D.DATE OF BIRTH:  10/07/1960  DATE OF PROCEDURE:  05/15/2012 DATE OF DISCHARGE:                              OPERATIVE REPORT   PREOPERATIVE DIAGNOSES:  Fever of unknown origin, mediastinal adenopathy.  POSTOPERATIVE DIAGNOSES:  Fever of unknown origin, mediastinal adenopathy.  PROCEDURE:  Mediastinoscopy.  SURGEON:  Salvatore Decent. Dorris Fetch, M.D.  ASSISTANT:  None.  ANESTHESIA:  General.  FINDINGS:  Enlarged, relatively vascular lymph nodes.  Nodular tissue, which appeared to be lymph node in the superior mediastinum, was in fact ectopic thyroid.  Frozen section revealed abundant histiocytes, but no definite tumor or granulomas.  CLINICAL NOTE:  Mr. Viernes is a 51 year old with multiple sclerosis. He presented with general malaise, fever of unknown origin, and progressive lower extremity weakness.  He was admitted and treated.  His workup had failed to reveal a source of his fever.  CT of the chest showed mediastinal adenopathy.  The patient was advised to undergo mediastinoscopy for diagnostic purposes primarily to rule out lymphoma, but also to assess for other possible causes such as an infectious source.  The indications, risks, benefits, and alternatives were discussed in detail with the patient.  He understood the risks of mediastinoscopy and agreed to proceed.  OPERATIVE NOTE:  Mr. Yepes was brought to the operating room on May 15, 2012 and Anesthesia placed an arterial blood pressure monitoring line and established intravenous access.  Intravenous antibiotics were already being given during the patient's course of treatment and no additional antibiotics were given for the procedure.  He was anesthetized and intubated.  PAS hose was placed for DVT prophylaxis. The neck and  chest were prepped and draped in usual sterile fashion.  A transverse incision was made in the lower neck one fingerbreadth above the sternal notch, was carried through the skin and subcutaneous tissue. Hemostasis was achieved.  The strap muscles were separated in the midline.  The thyroid was identified.  The pretracheal fascia was identified and incised and developed bluntly into the mediastinum. Mediastinoscope was inserted.  On insertion, relatively high up in the neck, there was a round circular what appeared to be a lymph node.  This was labeled as 2R node and was biopsied and sent for frozen section, but ultimately returned as ectopic thyroid tissue.  The pretracheal plane was developed further into the mediastinum and systematic inspection of the mediastinal lymph node stations was carried out.  There were enlarged and friable level 4R nodes, 2 separate nodes were biopsied. These nodes were relatively vascular and bled easily.  The level 7 nodes then were dissected out and these were a little more enlarged than the 4R node, but otherwise, similar in appearance.  The biopsies of the 2 separate level 7 nodes were taken and sent as a single specimen. Additional biopsies were taken and sent for bacterial fungal and AFB cultures.  Hemostasis was achieved with cautery.  Surgicel was packed at the 4R and 7 sites.  The scope was withdrawn.  The wound was packed with gauze  for 5 minutes.  After waiting for 5 minutes, the scope was reinserted and there was good hemostasis.  The scope was withdrawn. Frozen section of 4R #2 specimen revealed reactive nodes with abundant histiocytes.  No definite tumor granulomas were seen.  The remainder of the specimens will be examined on permanent pathology.  The wound was closed in 2 layers with interrupted 3-0 Vicryl platysmal closure, followed by a 4-0 Vicryl subcuticular suture.  All sponge, needle, and instrument counts were correct at the end of the  procedure.  The patient was extubated in the operating room and taken to the Post-Anesthetic Care Unit in good condition.     Salvatore Decent Dorris Fetch, M.D.     SCH/MEDQ  D:  05/15/2012  T:  05/16/2012  Job:  [id number]

## 2012-05-16 NOTE — Progress Notes (Signed)
Physical Therapy Treatment/ Re-eval Patient Details Name: Chad Vasquez MRN: [id number] DOB: 05-30-1961 Today's Date: 05/16/2012 Time: 7829-5621 PT Time Calculation (min): 25 min  PT Assessment / Plan / Recommendation Comments on Treatment Session  Pt with significantly decreased strength and function from initial evaluation. Pt currently with 1/5 hip flexion bilaterally and 2-/5 knee extension, 2-/5 hip abduct/add on RLE. Pt unable to pivot to chair without 2 person assist and at this point recommend CIR prior to home. Pt needs extensive transfer training with lack of bil LE function. Nursing notified for need to use maximove for return to bed.     Follow Up Recommendations  Inpatient Rehab    Barriers to Discharge        Equipment Recommendations  Defer to next venue    Recommendations for Other Services OT consult;Rehab consult  Frequency Min 4X/week   Plan Discharge plan needs to be updated;Frequency needs to be updated    Precautions / Restrictions Precautions Precautions: Fall Precaution Comments: MS exacerbation with limited bil LE strength   Pertinent Vitals/Pain No pain    Mobility  Bed Mobility Bed Mobility: Rolling Left;Sitting - Scoot to Edge of Bed;Left Sidelying to Sit Rolling Left: 4: Min assist Left Sidelying to Sit: 3: Mod assist;HOB elevated;With rails (HOB 20degrees) Sitting - Scoot to Edge of Bed: 4: Min guard Details for Bed Mobility Assistance: assist to bring bil LE to EOB and assist to elevate trunk from surface with cueing for reciprocal scooting to EOB Transfers Transfers: Sit to Stand;Stand to Sit;Squat Pivot Transfers Sit to Stand: 2: Max assist;From bed;From elevated surface;1: +2 Total assist Sit to Stand: Patient Percentage: 40% Stand to Sit: 2: Max assist;To chair/3-in-1;1: +2 Total assist;To bed Stand to Sit: Patient Percentage: 40% Squat Pivot Transfers: 1: +2 Total assist Squat Pivot Transfers: Patient Percentage: 30% Details for  Transfer Assistance: Pt stood first trial from elevated bed with RLE blocked and use of RW to push up with Bil UE. Second standing trial without RW with bil knees blocked and 2 person assist with decreased ability to achieve full standing. With squat pivot to chair pt reaching for chair armrest but required total assist to pivot hips to surface and control descent with total assist to position legs before and after transfer Ambulation/Gait Ambulation/Gait Assistance: Not tested (comment) Wheelchair Mobility Wheelchair Mobility: No    Exercises General Exercises - Lower Extremity Short Arc QuadBarbaraann Boys;Both;5 reps;Seated   PT Diagnosis:    PT Problem List:   PT Treatment Interventions:     PT Goals Acute Rehab PT Goals PT Goal Formulation: With patient Time For Goal Achievement: 05/30/12 Potential to Achieve Goals: Fair Pt will go Supine/Side to Sit: with supervision PT Goal: Supine/Side to Sit - Progress: Goal set today Pt will go Sit to Supine/Side: with min assist PT Goal: Sit to Supine/Side - Progress: Goal set today Pt will go Sit to Stand: with mod assist PT Goal: Sit to Stand - Progress: Goal set today Pt will go Stand to Sit: with mod assist PT Goal: Stand to Sit - Progress: Goal set today Pt will Transfer Bed to Chair/Chair to Bed: with mod assist PT Transfer Goal: Bed to Chair/Chair to Bed - Progress: Goal set today PT Goal: Ambulate - Progress: Discontinued (comment) (not appropriate at this time)  Visit Information  Last PT Received On: 05/16/12 Assistance Needed: +2    Subjective Data  Subjective: I just can't move my legs Patient Stated Goal: be able to get to  the bathroom and walk   Cognition  Overall Cognitive Status: Impaired Area of Impairment: Memory Arousal/Alertness: Awake/alert Orientation Level: Disoriented to;Time Behavior During Session: WFL for tasks performed Cognition - Other Comments: Pt reporting too many drugs today and confused throughout the  day thinking he was in a pizza parlor. Pt anxious about lack of mobility    Balance  Static Sitting Balance Static Sitting - Balance Support: Bilateral upper extremity supported;Feet supported Static Sitting - Level of Assistance: 4: Min assist;5: Stand by assistance Static Sitting - Comment/# of Minutes: Pt initially having difficulty maintaining midline posture with sitting balance EOB and required min assist to correct. After 2 min pt able to maintain midline with stand by assist for 5 min  End of Session PT - End of Session Equipment Utilized During Treatment: Gait belt Activity Tolerance: Patient tolerated treatment well Patient left: in chair;with call bell/phone within reach Nurse Communication: Mobility status   GP     Delorse Lek 05/16/2012, 12:19 PM Delaney Meigs, PT [phone]

## 2012-05-16 NOTE — Consult Note (Signed)
Physical Medicine and Rehabilitation Consult Reason for Consult: Multiple sclerosis exacerbation Referring Physician: [facility]   HPI: Chad Vasquez is a 51 y.o. right-handed male with history of multiple sclerosis diagnosed in 2010 on long-term steroids and copaxone and followed by Dr. Sandria Manly. Admitted 05/07/2012 with progressive weakness over a 24-hour period in the lower extremities as well as fever. MRI of the brain with appearance of chronic demyelinating disease of the brain no enhancement or evidence of acute demyelination or acute abnormality. MRI cervical spine, chronic demyelinating lesions in the spinal cord at C3 and T2 without new spinal cord lesion noted.MRI of the thoracic spine revealed multiple sclerosis plaques at T2, T10, T11. This was the first MRI of the thoracic spine in the [place] system that was obtained since the patient was diagnosed with MS 3 years ago. CT of the chest with pathologic mediastinal and hilar adenopathy. X-rays bilateral hips and pelvis without acute changes. Patient placed on a 5 day course of Solu-Medrol and since completed. He continued to have spiking fevers improved with ibuprofen. HIV testing negative. Workup for fever with mediastinoscopy biopsy 05/15/2012 and results pending. A urine drug screen was completed it was positive for cocaine. Patient with chronic back pain currently maintained on Dilaudid. Physical therapy ongoing with recommendations of physical medicine rehabilitation consult to consider inpatient rehabilitation services   Review of Systems  Genitourinary: Positive for urgency.  Musculoskeletal: Positive for back pain.  Neurological: Positive for tingling and weakness.  All other systems reviewed and are negative.   Past Medical History  Diagnosis Date  . Multiple sclerosis    History reviewed. No pertinent past surgical history. Family History  Problem Relation Age of Onset  . Heart failure Mother   . Stroke Mother   . Heart  failure Father   . Cancer Sister   . Seizures Brother    Social History:  reports that he has been smoking Cigarettes.  He has been smoking about .5 packs per day. He does not have any smokeless tobacco history on file. He reports that he drinks alcohol. He reports that he uses illicit drugs (Cocaine and Marijuana). Allergies: No Known Allergies Medications Prior to Admission  Medication Sig Dispense Refill  . carbamazepine (TEGRETOL) 200 MG tablet Take 200 mg by mouth 3 (three) times daily.      . Cholecalciferol (VITAMIN D) 2000 UNITS tablet Take 2,000 Units by mouth every morning.      . gabapentin (NEURONTIN) 300 MG capsule Take 300 mg by mouth 3 (three) times daily.      Marland Kitchen glatiramer (COPAXONE) 20 MG/ML injection Inject 20 mg into the skin every morning.      Marland Kitchen HYDROcodone-acetaminophen (VICODIN ES) 7.5-750 MG per tablet Take 1 tablet by mouth every 6 (six) hours as needed. For pain      . losartan (COZAAR) 100 MG tablet Take 100 mg by mouth every morning.       . ondansetron (ZOFRAN) 4 MG tablet Take 1 tablet (4 mg total) by mouth every 6 (six) hours.  12 tablet  0    Home: Home Living Lives With: Friend(s) Available Help at Discharge: Other (Comment) (friend is an amputee. Can not help much.) Type of Home: House Home Access: Ramped entrance Home Layout: One level Bathroom Shower/Tub: Tub/shower unit;Curtain Firefighter: Standard Bathroom Accessibility: Yes How Accessible: Accessible via walker Home Adaptive Equipment: Walker - four wheeled  Functional History: Prior Function Able to Take Stairs?: No Driving: Yes Vocation: On disability Comments:  has had MS since 2010 Functional Status:  Mobility: Bed Mobility Bed Mobility: Rolling Left;Sitting - Scoot to Edge of Bed;Left Sidelying to Sit Rolling Right: 6: Modified independent (Device/Increase time) Rolling Left: 4: Min assist Left Sidelying to Sit: 3: Mod assist;HOB elevated;With rails (HOB 20degrees) Sitting -  Scoot to Edge of Bed: 4: Min guard Transfers Transfers: Sit to Stand;Stand to Sit;Squat Pivot Transfers Sit to Stand: 2: Max assist;From bed;From elevated surface;1: +2 Total assist Sit to Stand: Patient Percentage: 40% Stand to Sit: 2: Max assist;To chair/3-in-1;1: +2 Total assist;To bed Stand to Sit: Patient Percentage: 40% Squat Pivot Transfers: 1: +2 Total assist Squat Pivot Transfers: Patient Percentage: 30% Ambulation/Gait Ambulation/Gait Assistance: Not tested (comment) Ambulation Distance (Feet): 120 Feet Assistive device: Rolling walker Gait Pattern: Step-through pattern;Decreased dorsiflexion - left General Gait Details: AFO for LLE is not available Stairs: No Wheelchair Mobility Wheelchair Mobility: No  ADL: ADL Grooming: Modified independent;Simulated Where Assessed - Grooming: Unsupported sitting Upper Body Bathing: Set up;Simulated Where Assessed - Upper Body Bathing: Unsupported sitting Lower Body Bathing: Supervision/safety;Simulated Where Assessed - Lower Body Bathing: Unsupported sit to stand Upper Body Dressing: Simulated;Modified independent Where Assessed - Upper Body Dressing: Unsupported sitting Lower Body Dressing: Performed;Supervision/safety Where Assessed - Lower Body Dressing: Unsupported sit to stand Toilet Transfer: Performed Toilet Transfer Method: Sit to stand Toilet Transfer Equipment:  (to bed then back to recliner) Transfers/Ambulation Related to ADLs: Patient transfers at a Supervision level with RW.  Cognition: Cognition Arousal/Alertness: Awake/alert Orientation Level: Oriented to person;Oriented to place;Oriented to situation Cognition Overall Cognitive Status: Impaired Area of Impairment: Memory Arousal/Alertness: Awake/alert Orientation Level: Disoriented to;Time Behavior During Session: WFL for tasks performed Cognition - Other Comments: Pt reporting too many drugs today and confused throughout the day thinking he was in a pizza  parlor. Pt anxious about lack of mobility  Blood pressure 142/76, pulse 107, temperature 99.1 F (37.3 C), temperature source Oral, resp. rate 19, height 6\' 4"  (1.93 m), weight 116 kg (255 lb 11.7 oz), SpO2 89.00%. Physical Exam  Constitutional: He is oriented to person, place, and time.  HENT:  Head: Normocephalic.  Eyes:       Pupils reactive to light  Neck: Neck supple. No thyromegaly present.  Cardiovascular: Normal rate.   Pulmonary/Chest: Breath sounds normal. He has no wheezes.  Abdominal: Bowel sounds are normal. He exhibits no distension. There is no tenderness. There is no rebound and no guarding.  Neurological: He is alert and oriented to person, place, and time.  Skin: Skin is warm and dry.  Psychiatric: He has a normal mood and affect.    Results for orders placed during the hospital encounter of 05/07/12 (from the past 24 hour(s))  GLUCOSE, CAPILLARY     Status: Abnormal   Collection Time   05/15/12 12:37 PM      Component Value Range   Glucose-Capillary 118 (*) 70 - 99 mg/dL   Comment 1 Documented in Chart     Comment 2 Notify RN    GLUCOSE, CAPILLARY     Status: Normal   Collection Time   05/15/12  5:15 PM      Component Value Range   Glucose-Capillary 93  70 - 99 mg/dL   Comment 1 Documented in Chart     Comment 2 Notify RN    GLUCOSE, CAPILLARY     Status: Abnormal   Collection Time   05/15/12  9:54 PM      Component Value Range   Glucose-Capillary 132 (*)  70 - 99 mg/dL  GLUCOSE, CAPILLARY     Status: Abnormal   Collection Time   05/16/12  7:42 AM      Component Value Range   Glucose-Capillary 104 (*) 70 - 99 mg/dL  GLUCOSE, CAPILLARY     Status: Abnormal   Collection Time   05/16/12 11:29 AM      Component Value Range   Glucose-Capillary 115 (*) 70 - 99 mg/dL   Dg Hip Bilateral W/pelvis  05/15/2012  *RADIOLOGY REPORT*  Clinical Data: Bilateral leg pain.  The patient is unable to move his legs.  BILATERAL HIP WITH PELVIS - 4+ VIEW  Comparison: CT  abdomen and pelvis 05/12/2012.  Findings: Both hips are located.  There is no fracture.  Mild degenerative change about the hips is identified.  The symphysis pubis and sacroiliac joints are unremarkable.  Soft tissue structures appear normal.  IMPRESSION: No acute finding.  Mild bilateral hip degenerative change.  Original Report Authenticated By: Bernadene Bell. Maricela Curet, M.D.   Dg Chest Port 1 View  05/15/2012  *RADIOLOGY REPORT*  Clinical Data: Postop mediastinoscopy  PORTABLE CHEST - 1 VIEW  Comparison: Chest radiograph 05/07/2012 and CT chest abdomen pelvis 05/12/2012 and CT chest 01/13/2007  Findings: Lung volumes are low bilaterally.  Heart size appears within normal limits for portable technique and low lung volumes. There is crowding of the lung markings and mild bibasilar atelectasis.  No evidence of pneumothorax, pleural effusion or focal airspace. Surgical clips are seen in the right paratracheal region.  IMPRESSION: Low lung volumes and mild bibasilar atelectasis.  Postoperative changes in the right paratracheal region.  Negative for pneumothorax or other acute finding.  Original Report Authenticated By: Britta Mccreedy, M.D.    Assessment/Plan: Diagnosis: multiple sclerosis with paraparesis as well as fever of unknown origin 1. Does the need for close, 24 hr/day medical supervision in concert with the patient's rehab needs make it unreasonable for this patient to be served in a less intensive setting? Potentially 2. Co-Morbidities requiring supervision/potential complications: mediastinal adenopathy, shortness of breath 3. Due to bladder management, safety, skin/wound care, disease management, medication administration, pain management and patient education, does the patient require 24 hr/day rehab nursing? Potentially 4. Does the patient require coordinated care of a physician, rehab nurse, PT (1-2 hrs/day, 5 days/week) and OT (1-2 hrs/day, 5 days/week) to address physical and functional deficits  in the context of the above medical diagnosis(es)? Potentially Addressing deficits in the following areas: balance, endurance, locomotion, strength, transferring, bathing, dressing and toileting 5. Can the patient actively participate in an intensive therapy program of at least 3 hrs of therapy per day at least 5 days per week? currently he is not able to participate because of fevers which make him feel very weak 6. The potential for patient to make measurable gains while on inpatient rehab is good 7. Anticipated functional outcomes upon discharge from inpatient rehab are supervision wheelchair level with PT, supervision to min assist ADLs with OT, not applicable with SLP. 8. Estimated rehab length of stay to reach the above functional goals is: 3 weeks 9. Does the patient have adequate social supports to accommodate these discharge functional goals? girlfriend is limited due to amputation.,  10. Anticipated D/C setting: Home 11. Anticipated post D/C treatments: Outpt therapy 12. Overall Rehab/Functional Prognosis: good  RECOMMENDATIONS: This patient's condition is appropriate for continued rehabilitative care in the following setting: if patient has 24 7 supervision available post discharge then a CIR stay would help up grade  his functional progress. If not then I would recommend SNF placement once medically stable Patient has agreed to participate in recommended program. Potentially Note that insurance prior authorization may be required for reimbursement for recommended care.  Comment:    05/16/2012

## 2012-05-16 NOTE — Consult Note (Signed)
[facility]  Total days of antibiotics :6        Day:  Ampicillin on 05/11/12 and 05/12/12        Day :Ceftrixone on 05/11/12 and 05/12/12        Day : Zosyn 8/8-12/13                                                                                     Day : vancomycin 8/8-13/13       Reason for Consult: Fever of unknown origin    Referring Physician: Marinda Elk, MD  Active Problems:  Multiple sclerosis exacerbation  Fever  HTN (hypertension)  Tobacco abuse  Chronic pain  Polysubstance abuse  Lower extremity pain  I have reviewed patients current medication: . aspirin EC  81 mg Oral Daily  . carbamazepine  200 mg Oral TID  . Chlorhexidine Gluconate Cloth  6 each Topical Once  . cholecalciferol  2,000 Units Oral BH-q7a  . docusate sodium  100 mg Oral BID  . gabapentin  600 mg Oral TID  . glatiramer  20 mg Subcutaneous Daily  . HYDROmorphone      . ibuprofen  600 mg Oral TID  . insulin aspart  0-15 Units Subcutaneous TID WC  . insulin aspart  0-5 Units Subcutaneous QHS  . losartan  100 mg Oral Daily  . pantoprazole  40 mg Oral BID AC  . sodium chloride  3 mL Intravenous Q12H  . DISCONTD: Chlorhexidine Gluconate Cloth  6 each Topical Q0600  . DISCONTD: piperacillin-tazobactam (ZOSYN)  IV  3.375 g Intravenous Q8H  . DISCONTD: vancomycin  1,250 mg Intravenous Q8H   HPI: Chad Vasquez is a 51 y.o. male PMH of Multiple sclerosis and polysubstance abuse p/w fever and bilateral lower extremities weakness on 05/07/2012 at [facility]. Fever workup done. Later Pt was transferred to the [facility] hospital on 05/12/12 for further evaluation. Pt was treated with 5 days of steroid for MS flare-up since 05/07/12. His fever was waxing and waning until now. MRI thoracic and lumber spine, MRI head, LP and CSF analysis, CT abdomen  Unremarkable, except CT chest positive for mediastinal and hilar lymphadenopathy, suspected lymphoma. S/p mediastinoscopy negative  for malignancy but tissue lymph node culture pending. Empirically pt had 8 days of broad spectrum antibiotics.  labs are within normal range but Pt has spike fever even he is on acetaminophen/ ibuprofen. And maximum leukocytosis 11.5on 05/12/12.  Pt has c/o recent night sweats, fatigue but no cough, chest pain or wight loss. Pt denies any sick contacts, IV drug abuse or recent travel. No abdominal pain, joint pain, rashes, diarrhea or dysuria.  Review of Systems: Pertinent items are noted in HPI.  Past Medical History  Diagnosis Date  . Multiple sclerosis 2010  . HTN (hypertension)   . Polysubstance abuse     History  Substance Use Topics  . Smoking status: Current Everyday Smoker -- 0.5 packs/day    Types: Cigarettes  . Smokeless tobacco: Not on file  . Alcohol Use: Yes     occ    Family History  Problem Relation Age  of Onset  . Heart failure Mother   . Stroke Mother   . Heart failure Father   . Cancer Sister   . Seizures Brother    No Known Allergies  OBJECTIVE: Blood pressure 99/64, pulse 85, temperature 98.1 F (36.7 C), temperature source Oral, resp. rate 18, height 6\' 4"  (1.93 m), weight 116 kg (255 lb 11.7 oz), SpO2 95.00%. General: resting in chair, tired, NAD Skin: No rashes, ulcer noted HEENT: PERRL, EOMI, no scleral icterus, No lymphadenopathy palpable Cardiac: RRR, no rubs, murmurs or gallops Pulm: clear to auscultation bilaterally, moving normal volumes of air Abd: soft, nontender, nondistended, BS present Ext: Upper extremities full range motion. Lower extremities 2/5 strength, sensation intact Neuro: alert and oriented X3, cranial nerves II-XII grossly intact  Microbiology: Recent Results (from the past 240 hour(s))  CULTURE, BLOOD (ROUTINE X 2)     Status: Normal   Collection Time   05/07/12  5:45 PM      Component Value Range Status Comment   Specimen Description BLOOD LEFT ARM   Final    Special Requests BOTTLES DRAWN AEROBIC AND ANAEROBIC 4CC   Final     Culture NO GROWTH 5 DAYS   Final    Report Status 05/12/2012 FINAL   Final   URINE CULTURE     Status: Normal   Collection Time   05/07/12  5:55 PM      Component Value Range Status Comment   Specimen Description URINE, CATHETERIZED   Final    Special Requests NONE   Final    Culture  Setup Time 05/07/2012 21:53   Final    Colony Count NO GROWTH   Final    Culture NO GROWTH   Final    Report Status 05/09/2012 FINAL   Final   CULTURE, BLOOD (ROUTINE X 2)     Status: Normal   Collection Time   05/07/12  6:00 PM      Component Value Range Status Comment   Specimen Description BLOOD RIGHT ARM   Final    Special Requests BOTTLES DRAWN AEROBIC AND ANAEROBIC 6CC   Final    Culture NO GROWTH 5 DAYS   Final    Report Status 05/12/2012 FINAL   Final   CULTURE, BLOOD (ROUTINE X 2)     Status: Normal   Collection Time   05/10/12  5:05 PM      Component Value Range Status Comment   Specimen Description BLOOD LEFT HAND   Final    Special Requests BOTTLES DRAWN AEROBIC AND ANAEROBIC 12CC   Final    Culture NO GROWTH 5 DAYS   Final    Report Status 05/15/2012 FINAL   Final   CULTURE, BLOOD (ROUTINE X 2)     Status: Normal   Collection Time   05/10/12  5:10 PM      Component Value Range Status Comment   Specimen Description BLOOD RIGHT ANTECUBITAL   Final    Special Requests BOTTLES DRAWN AEROBIC AND ANAEROBIC 12CC   Final    Culture NO GROWTH 5 DAYS   Final    Report Status 05/15/2012 FINAL   Final   TISSUE CULTURE     Status: Normal (Preliminary result)   Collection Time   05/15/12  7:30 AM      Component Value Range Status Comment   Specimen Description TISSUE LYMPH NODE   Final    Special Requests NO 2/MEDIASTINAL   Final    Gram Stain  Final    Value: FEW WBC PRESENT,BOTH PMN AND MONONUCLEAR     NO SQUAMOUS EPITHELIAL CELLS SEEN     NO ORGANISMS SEEN   Culture NO GROWTH   Final    Report Status PENDING   Incomplete   AFB CULTURE WITH SMEAR     Status: Normal (Preliminary result)    Collection Time   05/15/12  7:30 AM      Component Value Range Status Comment   Specimen Description TISSUE LYMPH NODE   Final    Special Requests NO 2/MEDIASTINAL   Final    ACID FAST SMEAR NO ACID FAST BACILLI SEEN   Final    Culture     Final    Value: CULTURE WILL BE EXAMINED FOR 6 WEEKS BEFORE ISSUING A FINAL REPORT   Report Status PENDING   Incomplete   FUNGUS CULTURE W SMEAR     Status: Normal (Preliminary result)   Collection Time   05/15/12  7:30 AM      Component Value Range Status Comment   Specimen Description TISSUE LYMPH NODE   Final    Special Requests NO 2/MEDIASTINAL   Final    Fungal Smear NO YEAST OR FUNGAL ELEMENTS SEEN   Final    Culture CULTURE IN PROGRESS FOR FOUR WEEKS   Final    Report Status PENDING   Incomplete    Imaging/Diagnostic Tests:  Dg Chest Port 1 View 8/4 1. No acute cardiopulmonary disease.  2.Subsegmental left basilar atelectasis  Mr Thoracic Spine W Wo Contrast 8/5 1. Plaques in the thoracic spinal cord at T2, T10, and T11, most consistent with multiple sclerosis.  2. No evidence of transverse myelitis or spinal abscess.   Mr Lumbar Spine W Wo Contrast 8/5 1. Multilevel degenerative disc disease without focal neural impingement. No change since the prior exam.  2.No significant enhancement after contrast administration.   US Venous Img Lower Bilateral 8/5 No evidence of deep vein thrombosis, within either lower extremity.  MRI head w/wout contrast MRI cervical spine w/wout contrast 8/6 1. Stable MRI appearance of chronic demyelinating disease of the brain. No enhancement or evidence of acute demyelination.  2. Cervical spine findings are below.  3. No new intracranial abnormality.   Diagnostic LP 8/7 1.Slightly elevated opening pressure of 19.2 cm water.  2.Approximately 3 ml of blood-tinged CSF was obtained for pathological analysis.  CT ABDOMEN AND PELVIS 8/9 1. The suspected diffuse mild hepatic steatosis.  2. Several small  hypodense lesions in the left kidney are likely cyst but technically too small to characterize.  3. Degenerative grade 1 anterolisthesis of L5 on S1.  CT CHEST 8/9 1. Pathologic mediastinal and hilar adenopathy. No airway thickening or pleural nodularity to suggest pulmonary parenchymal findings of sarcoidosis. The appearance could reflect lymphoma. Tissue diagnosis is likely warranted.  2. Small hiatal hernia.  3. Old granulomatous disease  Bilateral hip with pelvis 8/12:  No acute finding. Mild bilateral hip degenerative change.  Mediastinoscopy 8/1 Mediastinal adenopathy  PORTABLE CHEST 8/12 Low lung volumes and mild bibasilar atelectasis. Postoperative changes in the right paratracheal region. Negative for  pneumothorax or other acute finding.  EKG 8/12:  Sinus tachycardia and Low voltage QRS  Labs summary: CBGs: 112 (8/13)  Blood cultures from 8/4: no growth final  Urine cultures: no growth final HIV Ab: nonreactive  HIV viral load: neg  RPR: nonreactive  Blood cx 8/7: no growth final   LP from 8/7, CSF culture: Not done due to  insufficient quantity LFTs: AST 56  (8/13)and ALT 203 and 229 (8/9 and 8/10) CBC: WBC range between 12.6 -9.9( 8/4 -8/10) and neutrophil 86 TSH: normal ANA, RPR -pending  Assessment and plan:  Fever of unknown origin: Chad Vasquez is a 51 y.o. male PMH of Multiple sclerosis and polysubstance abuse p/w fever and bilateral lower extremities weakness on 05/07/2012. Fever workup done.MRI thoracic and lumber spine, MRI head, LP and CSF analysis, CT abdomen , doppler, Imaging unremarkable except CT chest positive for Pathologic mediastinal and hilar adenopathy. S/p Mediastinoscopy negative for cancer and tissue biopsy pending. His fever is waxing and waning until now. Empirically pt had 8 days of broad spectrum antibiotics. Maximum leukocytosis 11.5 on 05/12/12.  Pt has c/o recent night sweats, fatigue, decrease appetite  and ct chest findings of  mediastinal and hilar adenopathy with  old granulomatous disease concern for bacterial or fungal infections. High ALT on LFTs.  1. TB quantiferon 2. Hepatitis panel  Akter, Nasrin 05/16/2012, 4:44 PM  INFECTIOUS DISEASE ATTENDING ADDENDUM:     [facility]   Date: 05/16/2012  Patient name: Chad Vasquez  Medical record number: [id number]  Date of birth: 1961-05-02    This patient has been seen and discussed with the house staff. Please see their note for complete details. I concur with their findings with the following additions/corrections:  INFECTIOUS DISEASE ATTENDING ADDENDUM:     [facility]   Date: 05/16/2012  Patient name: Chad Vasquez  Medical record number: [id number]  Date of birth: Nov 07, 1960    This patient has been seen and discussed with the house staff. Please see their note for complete details. I concur with their findings with the following additions/corrections:  51 year old patient with known MS (cared for by Dr. Sandria Manly) on copaxone, prednisone admitted to [facility] with lower extremity weakness and fever. He was treated with steroids (and courses of broad spectrum antibiotics) with persistent fevers. He was ultimately found to have multiple lymph nodes in the chest and underwent mediastinoscopy with resection of lymph nodes. Pathology showed abundant non-necrotizing granulomata. AFB and fungal, bacterial stains on path are pending and AFB, fungal stain, and gram stain with cultures incubating.  Given lymph node findings in immune-compromised patient, one must place infection with dimorphic fungus at the top of the list. TB would seem less likely given the patient's clinical presentation (he did have father with TB when pt was a child). We can certainly check quantiferon gold but i have next to zero suspicion for TB here.  I will send off urine and serum histoplasma, blastomyces antigens to [facility] in  [place]  [email or web address]  I will send off serum crypto antigen  I will ask the lab to incubate the fungal cultures for at least 6 weeks  We can consider other labs in [facility]UO workup such as CPK, CMV, EBV, Hepatitis serologies and workup for autoimmune pathology, SPEP  I would also send off a serum ACE level though sarcoid would seem unusual in pt on immunosuppressive drugs.  I may consider trial of empiric itraconazole while we await culture data back from lymph nodes.  I spent greater than 60 minutes with the patient including greater than 50% of time in face to face counsel of the patient and in coordination of their care.    WE could also see if path lab can do PCR for histo, blasto or crypto as well as TB    Paulette Blanch  Dam 05/16/2012, 7:41 PM   Acey Lav 05/16/2012, 7:40 PM

## 2012-05-17 ENCOUNTER — Inpatient Hospital Stay (HOSPITAL_COMMUNITY): Payer: Medicare Other

## 2012-05-17 ENCOUNTER — Ambulatory Visit: Payer: Medicare Other | Admitting: Physical Therapy

## 2012-05-17 DIAGNOSIS — F17200 Nicotine dependence, unspecified, uncomplicated: Secondary | ICD-10-CM

## 2012-05-17 LAB — GLUCOSE, CAPILLARY
Glucose-Capillary: 106 mg/dL — ABNORMAL HIGH (ref 70–99)
Glucose-Capillary: 98 mg/dL (ref 70–99)
Glucose-Capillary: 85 mg/dL (ref 70–99)

## 2012-05-17 LAB — ANGIOTENSIN CONVERTING ENZYME: Angiotensin-Converting Enzyme: >100 U/L — ABNORMAL HIGH (ref 8–52)

## 2012-05-17 LAB — HEPATIC FUNCTION PANEL
AST: 67 U/L — ABNORMAL HIGH (ref 0–37)
Albumin: 2.1 g/dL — ABNORMAL LOW (ref 3.5–5.2)
Alkaline Phosphatase: 121 U/L — ABNORMAL HIGH (ref 39–117)
Total Bilirubin: 1.2 mg/dL (ref 0.3–1.2)
Total Protein: 5.7 g/dL — ABNORMAL LOW (ref 6.0–8.3)

## 2012-05-17 LAB — CK: Total CK: 60 U/L (ref 7–232)

## 2012-05-17 LAB — C-REACTIVE PROTEIN: CRP: 29 mg/dL — ABNORMAL HIGH (ref <0.60)

## 2012-05-17 LAB — CRYPTOCOCCAL ANTIGEN: Crypto Ag: NEGATIVE

## 2012-05-17 MED ORDER — WHITE PETROLATUM GEL
Status: AC
  Administered 2012-05-17: 14:00:00
  Filled 2012-05-17: qty 5

## 2012-05-17 MED ORDER — ITRACONAZOLE 100 MG PO CAPS
200.0000 mg | ORAL_CAPSULE | Freq: Two times a day (BID) | ORAL | Status: DC
  Administered 2012-05-20: 200 mg via ORAL
  Filled 2012-05-17 (×3): qty 2

## 2012-05-17 MED ORDER — IBUPROFEN 400 MG PO TABS
400.0000 mg | ORAL_TABLET | ORAL | Status: DC | PRN
  Filled 2012-05-17: qty 1

## 2012-05-17 MED ORDER — ITRACONAZOLE 100 MG PO CAPS
200.0000 mg | ORAL_CAPSULE | Freq: Three times a day (TID) | ORAL | Status: DC
  Administered 2012-05-17 – 2012-05-19 (×7): 200 mg via ORAL
  Filled 2012-05-17 (×8): qty 2

## 2012-05-17 NOTE — Progress Notes (Signed)
[facility] PROGRESS NOTE  Chad Vasquez ZOX:[id number] DOB: 1961/03/23 DOA: 05/07/2012   Assessment/Plan:  Multiple sclerosis exacerbation (05/07/2012) -completed a five-day course of Solu-Medrol.  -CIR consult obtained but the patient was not found eligible for inpatient rehabilitation - Patient is back on Copaxone - his chronic maintenance medication  Fever (05/07/2012) - Empiric treatment with vancomycin zosyn stop, completed a 8 day course.Improved with ibuprofen. A CT scan of the chest was positive for  lymphadenopathy, HIV negative. The patient was transferred to [facility] and he underwent mediastinoscopy with biopsy of a mediastinal lymph node on 8/12. The results were negative for lymphoma. Negative bacterial cultures till date. Fungal cultures sent out. Blood cultures x2 continue to be negative CSF cultures are negative urine cultures are negative. ANA was obtained on August 13 and it was negative. The patient was not on any new drugs recently that will cause fever. - appreciate ID input  HTN (hypertension) (05/07/2012) .   Chronic pain (05/07/2012) -continue narcotics and ibuprofen. Get physical therapy.  Abnormal LFTs - suspect due to fatty liver ds. Hepatitis panel pending     Polysubstance abuse (05/07/2012) -counseled  Code Status: Full code Family Communication:  Significant other Pettigrew,Irene [phone] Disposition Plan: To be determined     LOS: 10 days   Procedures:  Thoracic lymph node biopsy - mediastinoscopy   Consultants    Infectious diseases  Antibiotics:  Vancomycin and zosyn completed a 8 days course   Subjective: Patient is feeling poorly   Objective: Filed Vitals:   05/16/12 2120 05/17/12 0542 05/17/12 0721 05/17/12 1400  BP: 112/71 174/79  156/92  Pulse: 79 108  82  Temp: 97.7 F (36.5 C) 101.3 F (38.5 C) 97.3 F (36.3 C) 98.2 F (36.8 C)  TempSrc: Oral Oral Oral Oral  Resp: 20 24    Height:      Weight:        SpO2: 93% 90%  94%    Intake/Output Summary (Last 24 hours) at 05/17/12 1602 Last data filed at 05/17/12 1100  Gross per 24 hour  Intake    118 ml  Output   1625 ml  Net  -1507 ml   Weight change:   Exam:  General: Awake, but lethargic, oriented x3, in no acute distress.  HEENT: No bruits, no goiter.  Scar in neck looks clean. Heart: Regular rate and rhythm, without murmurs, rubs, gallops.  Lungs: Good bilateral air movement.  Abdomen : soft, NT   Data Reviewed: Basic Metabolic Panel:  Lab 05/16/12 5621 05/13/12 0612 05/12/12 0513  NA 137 140 138  K 3.6 4.2 --  CL 100 105 103  CO2 25 21 24   GLUCOSE 112* 98 324*  BUN 10 16 19   CREATININE 0.81 0.85 0.95  CALCIUM 8.9 8.7 8.8  MG -- -- --  PHOS 2.5 -- --   Liver Function Tests:  Lab 05/16/12 1225 05/13/12 0612 05/12/12 0513  AST -- 56* 53*  ALT -- 203* 229*  ALKPHOS -- 84 92  BILITOT -- 0.4 0.3  PROT -- 6.4 6.3  ALBUMIN 2.5* 2.8* 2.8*   No results found for this basename: LIPASE:5,AMYLASE:5 in the last 168 hours No results found for this basename: AMMONIA:5 in the last 168 hours CBC:  Lab 05/13/12 0612 05/12/12 0513  WBC 9.9 11.5*  NEUTROABS -- --  HGB 13.1 12.2*  HCT 38.4* 34.9*  MCV 91.4 91.6  PLT 190 187   Cardiac Enzymes:  Lab 05/17/12 0530  CKTOTAL  60  CKMB --  CKMBINDEX --  TROPONINI --   BNP: No components found with this basename: POCBNP:5 CBG:  Lab 05/17/12 1200 05/17/12 0733 05/16/12 2139 05/16/12 1642 05/16/12 1129  GLUCAP 110* 106* 103* 125* 115*    Recent Results (from the past 240 hour(s))  CULTURE, BLOOD (ROUTINE X 2)     Status: Normal   Collection Time   05/07/12  5:45 PM      Component Value Range Status Comment   Specimen Description BLOOD LEFT ARM   Final    Special Requests BOTTLES DRAWN AEROBIC AND ANAEROBIC 4CC   Final    Culture NO GROWTH 5 DAYS   Final    Report Status 05/12/2012 FINAL   Final   URINE CULTURE     Status: Normal   Collection Time   05/07/12  5:55  PM      Component Value Range Status Comment   Specimen Description URINE, CATHETERIZED   Final    Special Requests NONE   Final    Culture  Setup Time 05/07/2012 21:53   Final    Colony Count NO GROWTH   Final    Culture NO GROWTH   Final    Report Status 05/09/2012 FINAL   Final   CULTURE, BLOOD (ROUTINE X 2)     Status: Normal   Collection Time   05/07/12  6:00 PM      Component Value Range Status Comment   Specimen Description BLOOD RIGHT ARM   Final    Special Requests BOTTLES DRAWN AEROBIC AND ANAEROBIC 6CC   Final    Culture NO GROWTH 5 DAYS   Final    Report Status 05/12/2012 FINAL   Final   CULTURE, BLOOD (ROUTINE X 2)     Status: Normal   Collection Time   05/10/12  5:05 PM      Component Value Range Status Comment   Specimen Description BLOOD LEFT HAND   Final    Special Requests BOTTLES DRAWN AEROBIC AND ANAEROBIC 12CC   Final    Culture NO GROWTH 5 DAYS   Final    Report Status 05/15/2012 FINAL   Final   CULTURE, BLOOD (ROUTINE X 2)     Status: Normal   Collection Time   05/10/12  5:10 PM      Component Value Range Status Comment   Specimen Description BLOOD RIGHT ANTECUBITAL   Final    Special Requests BOTTLES DRAWN AEROBIC AND ANAEROBIC 12CC   Final    Culture NO GROWTH 5 DAYS   Final    Report Status 05/15/2012 FINAL   Final   TISSUE CULTURE     Status: Normal (Preliminary result)   Collection Time   05/15/12  7:30 AM      Component Value Range Status Comment   Specimen Description TISSUE LYMPH NODE   Final    Special Requests NO 2/MEDIASTINAL   Final    Gram Stain     Final    Value: FEW WBC PRESENT,BOTH PMN AND MONONUCLEAR     NO SQUAMOUS EPITHELIAL CELLS SEEN     NO ORGANISMS SEEN   Culture NO GROWTH 2 DAYS   Final    Report Status PENDING   Incomplete   AFB CULTURE WITH SMEAR     Status: Normal (Preliminary result)   Collection Time   05/15/12  7:30 AM      Component Value Range Status Comment   Specimen Description TISSUE LYMPH NODE  Final    Special  Requests NO 2/MEDIASTINAL   Final    ACID FAST SMEAR NO ACID FAST BACILLI SEEN   Final    Culture     Final    Value: CULTURE WILL BE EXAMINED FOR 6 WEEKS BEFORE ISSUING A FINAL REPORT   Report Status PENDING   Incomplete   FUNGUS CULTURE W SMEAR     Status: Normal (Preliminary result)   Collection Time   05/15/12  7:30 AM      Component Value Range Status Comment   Specimen Description TISSUE LYMPH NODE   Final    Special Requests NO 2/MEDIASTINAL   Final    Fungal Smear NO YEAST OR FUNGAL ELEMENTS SEEN   Final    Culture CULTURE IN PROGRESS FOR FOUR WEEKS   Final    Report Status PENDING   Incomplete      Studies: Ct Chest W Contrast  05/12/2012  *RADIOLOGY REPORT*  Clinical Data:  Fever unknown origin.  Chronic back pain.  Multiple sclerosis.  Weakness.  CT CHEST, ABDOMEN AND PELVIS WITH CONTRAST  Technique:  Multidetector CT imaging of the chest, abdomen and pelvis was performed following the standard protocol during bolus administration of intravenous contrast.  Contrast: 1 OMNIPAQUE IOHEXOL 300 MG/ML  SOLN (100 ml)  Comparison:  Multiple exams, including 05/07/2012  CT CHEST  Findings:  Right paratracheal node measures 1.3 cm in short axis, image 17 of series 3.  An AP window lymph node measures 1.6 cm in short axis, image 24 of series 3.  Right hilar adenopathy noted with one right hilar node measuring 1.5 cm in short axis, image 32 of series 3.  Left hilar adenopathy noted with several small calcifications, and also scattered infrahilar nodes.  Conglomerate subcarinal adenopathy measures 2.2 cm in short axis, image 30 of series 3.  Scattered small paraesophageal lymph nodes are present in the lower thorax.  Heart size is within normal limits.  A small hiatal hernia is present.  A 4 mm densely calcified right upper lobe pulmonary nodule on image 14 of series 4 is compatible with calcified granuloma.  Centrilobular emphysema noted.  There is mild atelectasis or scarring in the posterior basal  segments of both lower lobes. Several additional small calcified right upper lobe granulomas are present.  There are several small calcified granulomas in the left lung.  IMPRESSION:  1.  Pathologic mediastinal and hilar adenopathy.  No airway thickening or pleural nodularity to suggest pulmonary parenchymal findings of sarcoidosis.  The appearance could reflect lymphoma. Tissue diagnosis is likely warranted. 2.  Small hiatal hernia. 3.  Old granulomatous disease.  CT ABDOMEN AND PELVIS  Findings:  Diffuse mild low density in the liver suggests diffuse mild hepatic steatosis.  The spleen, adrenal glands, and pancreas appear unremarkable.  The gallbladder and biliary system appear unremarkable.  Small peripancreatic lymph nodes do not appear pathologically enlarged by size criteria.  The right kidney appears normal.  Several small hypodense lesions in the left kidney are technically too small to characterize although statistically likely represent cysts.  The renal calculi referenced on the prior ultrasound examination from 07/06/2001 are not observed on today's exam. No pathologic pelvic adenopathy is identified.  The appendix is within normal limits.  No significant colonic diverticular disease observed.  No dilated bowel noted.  Urinary bladder appears unremarkable.  Degenerative grade 1 anterolisthesis of L5 on S1 is noted.  IMPRESSION:  1.  The suspected diffuse mild hepatic steatosis. 2.  Several small hypodense lesions in the left kidney are likely cyst but technically too small to characterize. 3.  Degenerative grade 1 anterolisthesis of L5 on S1.  Original Report Authenticated By: Dellia Cloud, M.D.   Mr Laqueta Jean Wo Contrast  05/09/2012  *RADIOLOGY REPORT*  Clinical Data:  51 year old male with lower extremity weakness. History of multiple sclerosis.  MRI HEAD WITHOUT AND WITH CONTRAST MRI CERVICAL SPINE WITHOUT AND WITH CONTRAST  Technique:  Multiplanar, multiecho pulse sequences of the brain and  surrounding structures, and cervical spine, to include the craniocervical junction and cervicothoracic junction, were obtained without and with intravenous contrast.  Contrast: 20mL MULTIHANCE GADOBENATE DIMEGLUMINE 529 MG/ML IV SOLN  Comparison:  Thoracic spine study performed the previous day and reported separately.  Brain and cervical MRI 09/15/2010 and earlier.  MRI HEAD  Findings:  Normal cerebral volume. No restricted diffusion to suggest acute infarction.  No midline shift, mass effect, evidence of mass lesion, ventriculomegaly, extra-axial collection or acute intracranial hemorrhage.  Cervicomedullary junction and pituitary are within normal limits.  Major intracranial vascular flow voids are stable, dominant left vertebral artery.  Normal bone marrow signal.  No abnormal enhancement identified.  Chronically advanced T2 and FLAIR hyperintense foci throughout the brain are stable since 2011. Cerebral hemisphere lesions are oriented perpendicular to the lateral ventricles as before, typical in appearance for multiple sclerosis.  There is involvement of the brain stem as before. Cervical spine findings are below.  Orbit soft tissues are within normal limits.  Stable minor paranasal sinus mucosal thickening.  Mastoids remain clear. Negative scalp soft tissues.  IMPRESSION: 1.  Stable MRI appearance of chronic demyelinating disease of the brain.  No enhancement or evidence of acute demyelination. 2.  Cervical spine findings are below. 3.  No new intracranial abnormality.  MRI CERVICAL SPINE  Findings: Stable cervical vertebral height and alignment with preserved lordosis. No marrow edema or evidence of acute osseous abnormality.  Visualized paraspinal soft tissues are within normal limits.  Chronic T2 and STIR hyperintense right lateral spinal cord plaque at C3 is re-identified and not significantly changed.  Elsewhere the cervical spinal cord signal is within normal limits.  Upper thoracic spinal cord plaque is  partially visible on the left at T2 and is also chronic.  No abnormal spinal cord or intradural enhancement to suggest acute demyelination.  Stable chronic predominately mild cervical spine degenerative changes.  There is minimal to mild spinal stenosis at C6-C7 with moderate to severe bilateral foraminal stenosis again noted, related to disc bulge and ligament flavum hypertrophy.  There is a mild chronic disc bulge at C3-C4 with chronic uncovertebral hypertrophy and mild to moderate to bilateral foraminal stenosis.  IMPRESSION: 1.  Stable chronic demyelinating lesions in the spinal cord at C3 and T2.  No new spinal cord signal abnormality.  No enhancement to suggest acute demyelination. 2.  Stable mild for age cervical degenerative changes.  Original Report Authenticated By: Harley Hallmark, M.D.   Mr Cervical Spine W Wo Contrast  05/09/2012  *RADIOLOGY REPORT*  Clinical Data:  51 year old male with lower extremity weakness. History of multiple sclerosis.  MRI HEAD WITHOUT AND WITH CONTRAST MRI CERVICAL SPINE WITHOUT AND WITH CONTRAST  Technique:  Multiplanar, multiecho pulse sequences of the brain and surrounding structures, and cervical spine, to include the craniocervical junction and cervicothoracic junction, were obtained without and with intravenous contrast.  Contrast: 20mL MULTIHANCE GADOBENATE DIMEGLUMINE 529 MG/ML IV SOLN  Comparison:  Thoracic spine study performed the previous  day and reported separately.  Brain and cervical MRI 09/15/2010 and earlier.  MRI HEAD  Findings:  Normal cerebral volume. No restricted diffusion to suggest acute infarction.  No midline shift, mass effect, evidence of mass lesion, ventriculomegaly, extra-axial collection or acute intracranial hemorrhage.  Cervicomedullary junction and pituitary are within normal limits.  Major intracranial vascular flow voids are stable, dominant left vertebral artery.  Normal bone marrow signal.  No abnormal enhancement identified.   Chronically advanced T2 and FLAIR hyperintense foci throughout the brain are stable since 2011. Cerebral hemisphere lesions are oriented perpendicular to the lateral ventricles as before, typical in appearance for multiple sclerosis.  There is involvement of the brain stem as before. Cervical spine findings are below.  Orbit soft tissues are within normal limits.  Stable minor paranasal sinus mucosal thickening.  Mastoids remain clear. Negative scalp soft tissues.  IMPRESSION: 1.  Stable MRI appearance of chronic demyelinating disease of the brain.  No enhancement or evidence of acute demyelination. 2.  Cervical spine findings are below. 3.  No new intracranial abnormality.  MRI CERVICAL SPINE  Findings: Stable cervical vertebral height and alignment with preserved lordosis. No marrow edema or evidence of acute osseous abnormality.  Visualized paraspinal soft tissues are within normal limits.  Chronic T2 and STIR hyperintense right lateral spinal cord plaque at C3 is re-identified and not significantly changed.  Elsewhere the cervical spinal cord signal is within normal limits.  Upper thoracic spinal cord plaque is partially visible on the left at T2 and is also chronic.  No abnormal spinal cord or intradural enhancement to suggest acute demyelination.  Stable chronic predominately mild cervical spine degenerative changes.  There is minimal to mild spinal stenosis at C6-C7 with moderate to severe bilateral foraminal stenosis again noted, related to disc bulge and ligament flavum hypertrophy.  There is a mild chronic disc bulge at C3-C4 with chronic uncovertebral hypertrophy and mild to moderate to bilateral foraminal stenosis.  IMPRESSION: 1.  Stable chronic demyelinating lesions in the spinal cord at C3 and T2.  No new spinal cord signal abnormality.  No enhancement to suggest acute demyelination. 2.  Stable mild for age cervical degenerative changes.  Original Report Authenticated By: Harley Hallmark, M.D.   Mr  Thoracic Spine W Wo Contrast  05/08/2012  *RADIOLOGY REPORT*  Clinical Data: Lower extremity weakness.  Fever.  Multiple sclerosis.  MRI THORACIC SPINE WITHOUT AND WITH CONTRAST  Technique:  Multiplanar and multiecho pulse sequences of the thoracic spine were obtained without and with intravenous contrast.  Contrast: 20mL MULTIHANCE GADOBENATE DIMEGLUMINE 529 MG/ML IV SOLN  Comparison: Cervical MRI dated 09/15/2010  Findings: The patient has a subtle plaque in the left posterior lateral aspect of the thoracic spinal cord at T2, unchanged since the cervical MRI dated 09/15/2010.  There are also small focal plaques in the posterior columns at T10 and centrally in the spinal cord at T10-11.  The spinal cord is not enlarged.  There is no pathologic enhancement of the spinal cord or plaques after contrast administration.  The osseous structures of the thoracic spine are normal.  Discs are normal.  No foraminal or spinal stenosis.  The paraspinal soft tissues are normal.  IMPRESSION:  1.  Plaques in the thoracic spinal cord at T2, T10, and T11, most consistent with multiple sclerosis. 2.  No evidence of transverse myelitis or spinal abscess.  Original Report Authenticated By: Gwynn Burly, M.D.   Mr Lumbar Spine W Wo Contrast  05/08/2012  *RADIOLOGY  REPORT*  Clinical Data: Lower extremity weakness.  Fever.  Multiple sclerosis.  MRI LUMBAR SPINE WITHOUT AND WITH CONTRAST  Technique:  Multiplanar and multiecho pulse sequences of the lumbar spine were obtained without and with intravenous contrast.  Contrast: 20mL MULTIHANCE GADOBENATE DIMEGLUMINE 529 MG/ML IV SOLN  Comparison: MRI dated 02/25/2012  Findings: Scan extends from T11-12 through S2.  Tip of the conus is at L1-2 and appears normal.  T11-12:  Normal.  T12-L1:  Small disc bulge central and slightly to the right without neural impingement without change.  L1-2:  Small broad-based disc bulge with no neural impingement, unchanged.  L2-3:  Far lateral disc bulges  to the right and left without neural impingement and without change.  L3-4:  Far lateral annular tears of the right and left without neural impingement, unchanged.  Moderate bilateral facet arthritis. Slight narrowing of the spinal canal, unchanged.  L4-5:  Moderate bilateral facet hypertrophy.  Small annular tear to the left.  No neural impingement.  L5 S1:  5 mm spondylolisthesis with slight bulging of the uncovered disc with slight narrowing of the neural foramina, right greater than left, without focal neural impingement, unchanged.  IMPRESSION:   Multilevel degenerative disc disease without focal neural impingement.  No change since the prior exam.  No significant enhancement after contrast administration.  Original Report Authenticated By: Gwynn Burly, M.D.   Ct Abdomen Pelvis W Contrast  05/12/2012  *RADIOLOGY REPORT*  Clinical Data:  Fever unknown origin.  Chronic back pain.  Multiple sclerosis.  Weakness.  CT CHEST, ABDOMEN AND PELVIS WITH CONTRAST  Technique:  Multidetector CT imaging of the chest, abdomen and pelvis was performed following the standard protocol during bolus administration of intravenous contrast.  Contrast: 1 OMNIPAQUE IOHEXOL 300 MG/ML  SOLN (100 ml)  Comparison:  Multiple exams, including 05/07/2012  CT CHEST  Findings:  Right paratracheal node measures 1.3 cm in short axis, image 17 of series 3.  An AP window lymph node measures 1.6 cm in short axis, image 24 of series 3.  Right hilar adenopathy noted with one right hilar node measuring 1.5 cm in short axis, image 32 of series 3.  Left hilar adenopathy noted with several small calcifications, and also scattered infrahilar nodes.  Conglomerate subcarinal adenopathy measures 2.2 cm in short axis, image 30 of series 3.  Scattered small paraesophageal lymph nodes are present in the lower thorax.  Heart size is within normal limits.  A small hiatal hernia is present.  A 4 mm densely calcified right upper lobe pulmonary nodule on  image 14 of series 4 is compatible with calcified granuloma.  Centrilobular emphysema noted.  There is mild atelectasis or scarring in the posterior basal segments of both lower lobes. Several additional small calcified right upper lobe granulomas are present.  There are several small calcified granulomas in the left lung.  IMPRESSION:  1.  Pathologic mediastinal and hilar adenopathy.  No airway thickening or pleural nodularity to suggest pulmonary parenchymal findings of sarcoidosis.  The appearance could reflect lymphoma. Tissue diagnosis is likely warranted. 2.  Small hiatal hernia. 3.  Old granulomatous disease.  CT ABDOMEN AND PELVIS  Findings:  Diffuse mild low density in the liver suggests diffuse mild hepatic steatosis.  The spleen, adrenal glands, and pancreas appear unremarkable.  The gallbladder and biliary system appear unremarkable.  Small peripancreatic lymph nodes do not appear pathologically enlarged by size criteria.  The right kidney appears normal.  Several small hypodense lesions in the left  kidney are technically too small to characterize although statistically likely represent cysts.  The renal calculi referenced on the prior ultrasound examination from 07/06/2001 are not observed on today's exam. No pathologic pelvic adenopathy is identified.  The appendix is within normal limits.  No significant colonic diverticular disease observed.  No dilated bowel noted.  Urinary bladder appears unremarkable.  Degenerative grade 1 anterolisthesis of L5 on S1 is noted.  IMPRESSION:  1.  The suspected diffuse mild hepatic steatosis. 2.  Several small hypodense lesions in the left kidney are likely cyst but technically too small to characterize. 3.  Degenerative grade 1 anterolisthesis of L5 on S1.  Original Report Authenticated By: Dellia Cloud, M.D.   US Venous Img Lower Bilateral  05/08/2012  *RADIOLOGY REPORT*  Clinical Data: Bilateral leg weakness and hip pain, history of smoking, evaluate for  DVT  BILATERAL LOWER EXTREMITY VENOUS DUPLEX ULTRASOUND  Technique:  Gray-scale sonography with graded compression, as well as color Doppler and duplex ultrasound, were performed to evaluate the deep venous system of both lower extremities from the level of the common femoral vein through the popliteal and proximal calf veins.  Spectral Doppler was utilized to evaluate flow at rest and with distal augmentation maneuvers.  Comparison:  None.  Findings:  Normal compressibility of bilateral common femoral, superficial femoral, and popliteal veins is demonstrated, as well as the visualized proximal calf veins.  No filling defects to suggest DVT on grayscale or color Doppler imaging.  Doppler waveforms show normal direction of venous flow, normal respiratory phasicity and response to augmentation.  IMPRESSION: No evidence of deep vein thrombosis, within either lower extremity.  Original Report Authenticated By: Waynard Reeds, M.D.   Dg Chest Port 1 View  05/07/2012  *RADIOLOGY REPORT*  Clinical Data: Fever.  Infiltrate.  PORTABLE CHEST - 1 VIEW  Comparison: Multiple priors most recently 05/17/2012 at 1535 hours. Chest CT 01/13/2007.  Findings: Left costophrenic angle excluded from view.  Left basilar subsegmental atelectasis.  Prominent right pericardial fat pad. Cardiopericardial silhouette is within normal limits.  No airspace disease.  No effusion.  Calcified granuloma in the right upper lobe and punctate calcifications of the mediastinal lymph nodes again noted compatible with old granulomatous disease.  IMPRESSION: No acute cardiopulmonary disease.  Subsegmental left basilar atelectasis.  Original Report Authenticated By: Andreas Newport, M.D.   Dg Chest Port 1 View  05/07/2012  *RADIOLOGY REPORT*  Clinical Data: Weakness.  Fall.  Multiple sclerosis.  Smoker.  PORTABLE CHEST - 1 VIEW  Comparison: 06/14/2011 chest radiograph.  CT chest 01/13/2007  Findings: The normal heart size and pulmonary vascularity.  Calcified granulomas in the mid lungs.  Nodular hilar opacities consistent with prominent lymph nodes and granulomas as seen on previous chest CT.  Mild emphysematous changes in the upper lungs. No focal airspace consolidation.  No blunting of costophrenic angles.  No pneumothorax.  Old left rib fracture.  IMPRESSION: Calcified granulomas with calcified hilar lymph nodes. Emphysematous changes.  No evidence of active pulmonary disease.  Original Report Authenticated By: Marlon Pel, M.D.   Dg Fluoro Guide Lumbar Puncture  05/11/2012  *RADIOLOGY REPORT*  Clinical Data: Multiple Sclerosis, fever and headaches.  DIAGNOSTIC LUMBAR PUNCTURE UNDER FLUOROSCOPIC GUIDANCE  Fluoroscopy time:  1.8. minutes.  Technique:  Informed consent was obtained from the patient prior to the procedure, including potential complications of headache, allergy, and pain. Lovenox was withheld for 1.5 days.  With the patient prone oblique, the lower back was prepped with  Betadine. 1% Lidocaine was used for local anesthesia.  Lumbar puncture was performed at the L4-L5 level using a 12.7 cm 20  gauge needle with return of blood tinged CSF with an opening pressure of 19.2. cm water.   Only three ml of CSF were obtained for laboratory studies, even after placing the  patient in reverse Trendelenburg position. The patient tolerated the procedure well and there were no apparent complications.  IMPRESSION: Slightly elevated opening pressure of 19.2 cm water.  Approximately 3 ml of blood-tinged CSF was obtained for pathological analysis.  Original Report Authenticated By: Brandon Melnick, M.D.   Results for KHYRIE, MASI (MRN [id number]) as of 05/17/2012 07:43  Ref. Range 05/11/2012 13:35  RBC Count, CSF Latest Range: 0 /cu mm 5011 (H)  WBC, CSF Latest Range: 0-5 /cu mm 4  Segmented Neutrophils-CSF Latest Range: 0-6 % TOO FEW TO COUNT, SMEAR AVAILABLE FOR REVIEW  Appearance, CSF Latest Range: CLEAR  TURBID (A)  Color, CSF Latest Range:  COLORLESS  BLOODY (A)  Supernatant No range found NOT INDICATED  Tube # No range found 1  Blood cultures from 8/4: no growth final  Urine cultures: no growth final  HIV Ab: nonreactive  HIV viral load: neg  RPR: nonreactive  Blood cx 8/7: no growth final  LP from 8/7, CSF culture: Not done due to insufficient quantity  LFTs: AST 56 (8/13)and ALT 203 and 229 (8/9 and 8/10)  CBC: WBC range between 12.6 -9.9( 8/4 -8/10) and neutrophil 86  AFB culture from lymph node biopsy: Pending  TSH: normal  ANA-negative  RF- pending  CMV, EVB,TB quantiferon -Pending  Blastomyces and histoplasma ag sent to [facility]  Cryptococcal antigen- pending  ACE-pending  CK- normal range  Hepatitis panel-pending  SPE- pending     Scheduled Meds:    . aspirin EC  81 mg Oral Daily  . carbamazepine  200 mg Oral TID  . Chlorhexidine Gluconate Cloth  6 each Topical Once  . cholecalciferol  2,000 Units Oral BH-q7a  . docusate sodium  100 mg Oral BID  . gabapentin  600 mg Oral TID  . glatiramer  20 mg Subcutaneous Daily  . ibuprofen  600 mg Oral TID  . insulin aspart  0-15 Units Subcutaneous TID WC  . insulin aspart  0-5 Units Subcutaneous QHS  . pantoprazole  40 mg Oral BID AC  . sodium chloride  3 mL Intravenous Q12H  . white petrolatum      . DISCONTD: losartan  100 mg Oral Daily   Continuous Infusions:    . DISCONTD: sodium chloride 20 mL/hr (05/16/12 1943)    Deward Sebek [phone] If 7PM-7AM, please contact night-coverage [email or web address] Password [facility] 05/17/2012, 4:02 PM

## 2012-05-17 NOTE — Progress Notes (Signed)
Clinical Social Worker transferred from [facility] to [facility] and Clinical Social Worker at [employer] completed full psychosocial assessment on 05/08/2012 (see note). Clinical Social Worker received notification from [facility] that pt evaluated by CIR and CIR unable to accept and pt will need SNF placement. Clinical Social Worker met with pt and pt significant other at bedside. Clinical Social Worker discussed recommendation for rehab at [facility] and process of SNF placement. Pt is agreeable to SNF search in [place] and [place]. Pt insurance is [employer] which requires insurance authorization prior to discharge to SNF. Clinical Social Worker clarified pt and pt significant others questions in regard to Medicaid. Clinical Child psychotherapist to complete [employer] and initiate SNF search to [facility] and [employer]. Clinical Social Worker to submit pt clinical information to pt insurance to initiate authorization process. Clinical Social Worker to follow up with pt and pt significant other in regard to bed offers and pt insurance after clinicals are submitted. Clinical Social Worker to facilitate pt discharge needs when pt medically ready for discharge.  Jacklynn Lewis, MSW, LCSWA  Clinical Social Work [phone]

## 2012-05-17 NOTE — Progress Notes (Signed)
PT Cancellation Note  Treatment cancelled today due to medical issues with patient which prohibited therapy.  Patient reports feeling poorly with increased temp.  Patient declines PT.  Vena Austria 05/17/2012, 10:47 AM [phone]

## 2012-05-17 NOTE — Progress Notes (Signed)
Pt had an elevated temp of 101. Pt was given 650 mg of tylenol. RN will continue to montior pt

## 2012-05-17 NOTE — Progress Notes (Signed)
[facility]    Date of Admission:  05/07/2012                 Active Problems:  Multiple sclerosis exacerbation  Fever  HTN (hypertension)  Tobacco abuse  Chronic pain  Polysubstance abuse  Lower extremity pain  . aspirin EC  81 mg Oral Daily  . carbamazepine  200 mg Oral TID  . Chlorhexidine Gluconate Cloth  6 each Topical Once  . cholecalciferol  2,000 Units Oral BH-q7a  . docusate sodium  100 mg Oral BID  . gabapentin  600 mg Oral TID  . glatiramer  20 mg Subcutaneous Daily  . ibuprofen  600 mg Oral TID  . insulin aspart  0-15 Units Subcutaneous TID WC  . insulin aspart  0-5 Units Subcutaneous QHS  . pantoprazole  40 mg Oral BID AC  . sodium chloride  3 mL Intravenous Q12H  . DISCONTD: losartan  100 mg Oral Daily   Subjective: Pt was upset and full of tear when I started conversation with him this morning. He was frustrated for ongoing fever and no clear cause of fever, even after different tests and antibiotics use. He was worried about death and I tried to give him comfort and support, at the end of conversation he was happy and hopeful for some answer and cure of his ongoing fever.  Pt was c/o some chest tightness and dry cough but no pain, radiation,or SOB. Tmax 101.3 in last 24 hrs. WBC 9.9 on 8/13. No abdominal pain, rashes , diarrhea, or dysuria.  Objective: Temp:  [97.3 F (36.3 C)-101.3 F (38.5 C)] 98.2 F (36.8 C) (08/14 1400) Pulse Rate:  [79-108] 82  (08/14 1400) Resp:  [20-24] 24  (08/14 0542) BP: (112-174)/(71-92) 156/92 mmHg (08/14 1400) SpO2:  [90 %-94 %] 94 % (08/14 1400) General: resting in chair, tired, NAD  Skin: No rashes, ulcer noted  HEENT: PERRL, EOMI, no scleral icterus, No lymphadenopathy palpable  Cardiac: RRR, no rubs, murmurs or gallops  Pulm: clear to auscultation bilaterally, moving normal volumes of air  Abd: soft, nontender, nondistended, BS present  Ext: Upper extremities full range motion. Lower  extremities 2/5 strength, sensation intact  Neuro: alert and oriented X3, cranial nerves II-XII grossly intact   Lab Results Lab Results  Component Value Date   WBC 9.9 05/13/2012   HGB 13.1 05/13/2012   HCT 38.4* 05/13/2012   MCV 91.4 05/13/2012   PLT 190 05/13/2012    Lab Results  Component Value Date   CREATININE 0.81 05/16/2012   BUN 10 05/16/2012   NA 137 05/16/2012   K 3.6 05/16/2012   CL 100 05/16/2012   CO2 25 05/16/2012    Lab Results  Component Value Date   ALT 203* 05/13/2012   AST 56* 05/13/2012   ALKPHOS 84 05/13/2012   BILITOT 0.4 05/13/2012     Microbiology: Recent Results (from the past 240 hour(s))  CULTURE, BLOOD (ROUTINE X 2)     Status: Normal   Collection Time   05/07/12  5:45 PM      Component Value Range Status Comment   Specimen Description BLOOD LEFT ARM   Final    Special Requests BOTTLES DRAWN AEROBIC AND ANAEROBIC 4CC   Final    Culture NO GROWTH 5 DAYS   Final    Report Status 05/12/2012 FINAL   Final   URINE CULTURE     Status: Normal   Collection Time  05/07/12  5:55 PM      Component Value Range Status Comment   Specimen Description URINE, CATHETERIZED   Final    Special Requests NONE   Final    Culture  Setup Time 05/07/2012 21:53   Final    Colony Count NO GROWTH   Final    Culture NO GROWTH   Final    Report Status 05/09/2012 FINAL   Final   CULTURE, BLOOD (ROUTINE X 2)     Status: Normal   Collection Time   05/07/12  6:00 PM      Component Value Range Status Comment   Specimen Description BLOOD RIGHT ARM   Final    Special Requests BOTTLES DRAWN AEROBIC AND ANAEROBIC 6CC   Final    Culture NO GROWTH 5 DAYS   Final    Report Status 05/12/2012 FINAL   Final   CULTURE, BLOOD (ROUTINE X 2)     Status: Normal   Collection Time   05/10/12  5:05 PM      Component Value Range Status Comment   Specimen Description BLOOD LEFT HAND   Final    Special Requests BOTTLES DRAWN AEROBIC AND ANAEROBIC 12CC   Final    Culture NO GROWTH 5 DAYS   Final     Report Status 05/15/2012 FINAL   Final   CULTURE, BLOOD (ROUTINE X 2)     Status: Normal   Collection Time   05/10/12  5:10 PM      Component Value Range Status Comment   Specimen Description BLOOD RIGHT ANTECUBITAL   Final    Special Requests BOTTLES DRAWN AEROBIC AND ANAEROBIC 12CC   Final    Culture NO GROWTH 5 DAYS   Final    Report Status 05/15/2012 FINAL   Final   TISSUE CULTURE     Status: Normal (Preliminary result)   Collection Time   05/15/12  7:30 AM      Component Value Range Status Comment   Specimen Description TISSUE LYMPH NODE   Final    Special Requests NO 2/MEDIASTINAL   Final    Gram Stain     Final    Value: FEW WBC PRESENT,BOTH PMN AND MONONUCLEAR     NO SQUAMOUS EPITHELIAL CELLS SEEN     NO ORGANISMS SEEN   Culture NO GROWTH 2 DAYS   Final    Report Status PENDING   Incomplete   AFB CULTURE WITH SMEAR     Status: Normal (Preliminary result)   Collection Time   05/15/12  7:30 AM      Component Value Range Status Comment   Specimen Description TISSUE LYMPH NODE   Final    Special Requests NO 2/MEDIASTINAL   Final    ACID FAST SMEAR NO ACID FAST BACILLI SEEN   Final    Culture     Final    Value: CULTURE WILL BE EXAMINED FOR 6 WEEKS BEFORE ISSUING A FINAL REPORT   Report Status PENDING   Incomplete   FUNGUS CULTURE W SMEAR     Status: Normal (Preliminary result)   Collection Time   05/15/12  7:30 AM      Component Value Range Status Comment   Specimen Description TISSUE LYMPH NODE   Final    Special Requests NO 2/MEDIASTINAL   Final    Fungal Smear NO YEAST OR FUNGAL ELEMENTS SEEN   Final    Culture CULTURE IN PROGRESS FOR FOUR WEEKS   Final  Report Status PENDING   Incomplete    Imaging/Diagnostic Tests:  Dg Chest Port 1 View 8/4  1. No acute cardiopulmonary disease.  2.Subsegmental left basilar atelectasis   Mr Thoracic Spine W Wo Contrast 8/5  1. Plaques in the thoracic spinal cord at T2, T10, and T11, most consistent with multiple sclerosis.  2. No  evidence of transverse myelitis or spinal abscess.   Mr Lumbar Spine W Wo Contrast 8/5  1. Multilevel degenerative disc disease without focal neural impingement. No change since the prior exam.  2.No significant enhancement after contrast administration.   US Venous Img Lower Bilateral 8/5  No evidence of deep vein thrombosis, within either lower extremity.   MRI head w/wout contrast  MRI cervical spine w/wout contrast 8/6  1. Stable MRI appearance of chronic demyelinating disease of the brain. No enhancement or evidence of acute demyelination.  2. Cervical spine findings are below.  3. No new intracranial abnormality.   Diagnostic LP 8/7  1.Slightly elevated opening pressure of 19.2 cm water.  2.Approximately 3 ml of blood-tinged CSF was obtained for pathological analysis.   CT ABDOMEN AND PELVIS 8/9  1. The suspected diffuse mild hepatic steatosis.  2. Several small hypodense lesions in the left kidney are likely cyst but technically too small to characterize.  3. Degenerative grade 1 anterolisthesis of L5 on S1.   CT CHEST 8/9  1. Pathologic mediastinal and hilar adenopathy. No airway thickening or pleural nodularity to suggest pulmonary parenchymal findings of sarcoidosis. The appearance could reflect lymphoma. Tissue diagnosis is likely warranted.  2. Small hiatal hernia.  3. Old granulomatous disease   Bilateral hip with pelvis 8/12:  No acute finding. Mild bilateral hip degenerative change.  Mediastinoscopy 8/1  Mediastinal adenopathy   PORTABLE CHEST 8/12  Low lung volumes and mild bibasilar atelectasis. Postoperative changes in the right paratracheal region. Negative for  pneumothorax or other acute finding.   EKG 8/12:  Sinus tachycardia and Low voltage QRS   PORTABLE CHEST 8/14 Suspected mild interstitial edema  Labs summary:   CBGs: 112 (8/13)  Blood cultures from 8/4: no growth final  Urine cultures: no growth final  HIV Ab: nonreactive  HIV viral load:  neg  RPR: nonreactive  Blood cx 8/7: no growth final  LP from 8/7, CSF culture: Not done due to insufficient quantity  LFTs: AST 56 (8/13)and ALT 203 and 229 (8/9 and 8/10)  CBC: WBC range between 12.6 -9.9( 8/4 -8/10) and neutrophil 86  lymph node biopsy: GMS, PAS, and AFB stains are negative for yeast fungi and mycobacterium TSH: normal  ANA-negative RF- pending CMV, EVB,TB quantiferon -Pending Blastomyces and histoplasma ag sent to [facility] Cryptococcal antigen- pending ACE-pending CK- normal range Hepatitis panel-pending SPEP- pending  Assessment and plan:   Fever of unknown origin: QUINTUS PREMO is a 51 y.o. male PMH of Multiple sclerosis and polysubstance abuse p/w fever and bilateral lower extremities weakness on 05/07/2012. Fever workup done.MRI thoracic and lumber spine, MRI head, LP and CSF analysis, CT abdomen , doppler, Imaging unremarkable except CT chest positive for Pathologic mediastinal and hilar adenopathy. S/p Mediastinoscopy negative for cancer and  GMS, PAS, and AFB stains are negative for yeast fungi and mycobacterium. His fever is waxing and waning until now. Empirically pt had 8 days of broad spectrum antibiotics. Maximum leukocytosis 11.5 on 05/12/12.   Pt has c/o recent night sweats, fatigue, decrease appetite and ct chest findings of mediastinal and hilar adenopathy with old granulomatous disease concern for  micobacterial or fungal infections.   Pt Tmax 101.3, tachycardic and tachypneic , BP high. SO2 between 89-94. Pt doesn't look like toxic. Chest x-ray positive for mild interstitial edema. Possible causes is atypical pneumonia or fungal infections.   1. Empiriaclly start oral Itraconazole    Akter, Nasrin 05/17/2012, 10:09 AM   INFECTIOUS DISEASE ATTENDING ADDENDUM:     [facility]   Date: 05/17/2012  Patient name: TOMAZ JANIS  Medical record number: [id number]  Date of birth: 07-26-1961    This patient has been  seen and discussed with the house staff. Please see their note for complete details. I concur with their findings with the following additions/corrections:  Patient's afb. Gms, bacterial stains on path slides negative.  Will send anca (though doubt wegeners) ace pending (doubt sarcoid) sending ag for histo, blasto from urine, serum and serum crypto ag.   I proposed starting empiric itraconazole  Acey Lav 05/17/2012, 6:51 PM

## 2012-05-17 NOTE — Progress Notes (Signed)
I met with patient's wife at bedside. She is emotional concerning his fever this morning. She reports she has a bad back and is an amputee. Unable to give physical assistance that we recommend at d/c. For this reason, I recommend extended rehab in a SNF to give sufficient time for him to reach a level to be Modified independent at home that she can manage. I have discussed with RN CM. Wife requesting assistance with Medicaid also. Please call for questions. [phone]

## 2012-05-18 LAB — COMPREHENSIVE METABOLIC PANEL
ALT: 129 U/L — ABNORMAL HIGH (ref 0–53)
CO2: 27 mEq/L (ref 19–32)
Calcium: 8.6 mg/dL (ref 8.4–10.5)
Creatinine, Ser: 0.74 mg/dL (ref 0.50–1.35)
GFR calc Af Amer: >90 mL/min (ref >90)
GFR calc non Af Amer: >90 mL/min (ref >90)
Glucose, Bld: 116 mg/dL — ABNORMAL HIGH (ref 70–99)
Sodium: 141 mEq/L (ref 135–145)
Total Protein: 6.2 g/dL (ref 6.0–8.3)

## 2012-05-18 LAB — CBC
HCT: 31.6 % — ABNORMAL LOW (ref 39.0–52.0)
MCH: 31.7 pg (ref 26.0–34.0)
MCHC: 35.4 g/dL (ref 30.0–36.0)
RDW: 13.9 % (ref 11.5–15.5)

## 2012-05-18 LAB — GLUCOSE, CAPILLARY
Glucose-Capillary: 152 mg/dL — ABNORMAL HIGH (ref 70–99)
Glucose-Capillary: 153 mg/dL — ABNORMAL HIGH (ref 70–99)

## 2012-05-18 LAB — HEPATITIS PANEL, ACUTE: Hep B C IgM: NEGATIVE

## 2012-05-18 LAB — EPSTEIN-BARR VIRUS VCA ANTIBODY PANEL
EBV NA IgG: >600 U/mL — ABNORMAL HIGH (ref <18.0)
EBV VCA IgM: <10 U/mL (ref <36.0)

## 2012-05-18 MED ORDER — POTASSIUM CHLORIDE CRYS ER 20 MEQ PO TBCR
40.0000 meq | EXTENDED_RELEASE_TABLET | Freq: Once | ORAL | Status: AC
  Administered 2012-05-18: 40 meq via ORAL
  Filled 2012-05-18: qty 2

## 2012-05-18 MED ORDER — PREDNISONE 20 MG PO TABS
40.0000 mg | ORAL_TABLET | Freq: Every day | ORAL | Status: DC
  Administered 2012-05-18 – 2012-05-20 (×3): 40 mg via ORAL
  Filled 2012-05-18 (×4): qty 2

## 2012-05-18 NOTE — Progress Notes (Signed)
Clinical Social Worker met with pt at bedside to provide SNF bed offers. Pt significant other not yet at hospital and pt would like to await her arrival to discuss bed offers and make decision. Clinical Social Worker left contact information for pt and pt significant other to contact once pt significant other arrived. Clinical Social Worker contacted pt insurance, [employer] and left message to confirm insurance received pt clinicals. Clinical Social Worker to follow up with pt and pt significant other in regard to decision for SNF. Per MD, pt not yet medically ready for discharge. Clinical Social Worker to facilitate pt discharge needs when pt medically ready for discharge and insurance authorization received.  Jacklynn Lewis, MSW, LCSWA  Clinical Social Work [phone]

## 2012-05-18 NOTE — Progress Notes (Signed)
CSW met with pt and pt spouse to discuss pt bed offers and pt bed choice. Pt and pt spouse plan for [facility] when medically stable. CSW left message for admissions at [facility]. CSW also informed [employer] regarding insurance authorization.    Please follow up with unit csw.   Catha Gosselin, Theresia Majors  [phone] .05/18/2012 1618pm

## 2012-05-18 NOTE — Progress Notes (Signed)
INFECTIOUS DISEASE ATTENDING ADDENDUM:     [facility]   Date: 05/18/2012  Patient name: Chad Vasquez  Medical record number: [id number]  Date of birth: 03/25/1961    This patient has been seen and discussed with the house staff. Please see their note for complete details. I concur with their findings with the following additions/corrections:  Patients ACE level is off the charts at >100. This combined with non-necrotizing granulomas on pathology of his mediastinal LNodes makes diagnosis of Sarcoidosis. I am a bit perplexed that when pt was receiving soumedrol 1000mg  daily from 05/08/11 thru 05/12/12 that this did not make his fevers go away?  We could continue itraconazole to cover for fungal infection but all stains are negative and we DO HAVE AN ALTERNATIVE DIAGNOSIS TO EXPLAIN PTS GRANULOMAS, so I would favor stopping them but am happy to discuss further with Rheum (Dr. Dierdre Forth) and primary team    Paulette Blanch Dam 05/18/2012, 12:22 PM

## 2012-05-18 NOTE — Progress Notes (Signed)
Physical Therapy Treatment Patient Details Name: KARON HECKENDORN MRN: [id number] DOB: 26-Dec-1960 Today's Date: 05/18/2012 Time: 8657-8469 PT Time Calculation (min): 32 min  PT Assessment / Plan / Recommendation Comments on Treatment Session  Pt with MS exacerbation and waxing/waning fevers with decreased Bil LE strength. Pt initially hesistant to mobilize but with encouragement and education for benefit willing to attempt sliding board transfer OOB today. Pt educated for sequence and progression of transfers as well as encouraged to mobilize legs  as much as possible. Will continue to follow    Follow Up Recommendations  Skilled nursing facility    Barriers to Discharge        Equipment Recommendations       Recommendations for Other Services    Frequency     Plan Discharge plan needs to be updated;Frequency remains appropriate    Precautions / Restrictions Precautions Precautions: Fall Restrictions Weight Bearing Restrictions: No   Pertinent Vitals/Pain No pain    Mobility  Bed Mobility Bed Mobility: Rolling Left;Left Sidelying to Sit;Sitting - Scoot to [place] of Bed Rolling Left: 4: Min assist;With rail (assist of bil LE) Left Sidelying to Sit: 3: Mod assist;With rails;HOB elevated (HOB 15) Sitting - Scoot to Edge of Bed: 5: Supervision Details for Bed Mobility Assistance: cueing for use of rail and pushing up from side with bed slightly elevated and assist to bring Bil LE to EOB with cueing for sequence and weight shift to scoot to EOB. Initial assist for balance EOB when coming to sitting but improved from prior visit Transfers Transfers: Lateral/Scoot Transfers Lateral/Scoot Transfers: 4: Min assist (+1 for safety) Details for Transfer Assistance: Pt transferred bed to recliner with recliner padded with blankets to elevate height and bed elevated above armrest. Pt transferred to his left side with sliding board, assist to position bil LE and cueing throughout for posture,  hand positioin and sequence with assist to place and remove board.  Ambulation/Gait Ambulation/Gait Assistance: Not tested (comment)    Exercises General Exercises - Lower Extremity Short Arc Quad: AAROM;Both;10 reps;Seated (still grossly 2-/5 strength bil LE)   PT Diagnosis:    PT Problem List:   PT Treatment Interventions:     PT Goals Acute Rehab PT Goals PT Goal: Supine/Side to Sit - Progress: Progressing toward goal Pt will Transfer Bed to Chair/Chair to Bed: with supervision PT Transfer Goal: Bed to Chair/Chair to Bed - Progress: Updated due to goal met  Visit Information  Last PT Received On: 05/18/12 Assistance Needed: +2 (safety)    Subjective Data  Subjective: I just feel terrible all the time   Cognition  Overall Cognitive Status: Appears within functional limits for tasks assessed/performed Arousal/Alertness: Awake/alert Orientation Level: Appears intact for tasks assessed Behavior During Session: [facility] for tasks performed    Balance  Static Sitting Balance Static Sitting - Balance Support: Bilateral upper extremity supported;Feet supported Static Sitting - Level of Assistance: 5: Stand by assistance Static Sitting - Comment/# of Minutes: 3  End of Session PT - End of Session Activity Tolerance: Patient tolerated treatment well Patient left: in chair;with call bell/phone within reach;Other (comment) (lift pad positioned behind pt for back to bed)   GP     Toney Sang Beth 05/18/2012, 9:12 AM Delaney Meigs, PT [phone]

## 2012-05-18 NOTE — Progress Notes (Signed)
[facility] PROGRESS NOTE  Chad Vasquez ZOX:[id number] DOB: [birth year] DOA: 05/07/2012   Assessment/Plan:  presumedMultiple sclerosis exacerbation (05/07/2012) - not confirmed by MRI findings of the brain and cervical spine -completed a five-day course of Solu-Medrol. Just in case -CIR consult obtained but the patient was not found eligible for inpatient rehabilitation - Patient is back on Copaxone - his chronic maintenance medication  Fever (05/07/2012) - Empiric treatment with vancomycin zosyn stop, completed a 8 day course.Improved with ibuprofen. A CT scan of the chest was positive for  lymphadenopathy, HIV negative. The patient was transferred to [facility] and he underwent mediastinoscopy with biopsy of a mediastinal lymph node on 8/12. The results were negative for lymphoma. Negative bacterial cultures till date. Fungal cultures sent out. Blood cultures x2 continue to be negative CSF cultures are negative urine cultures are negative. ANA was obtained on August 13 and it was negative. The patient was not on any new drugs recently that will cause fever. 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 Discuss with Dr. Dierdre Forth from rheumatology and the plan is to start prednisone 40 mg daily and followup closely  HTN (hypertension) (05/07/2012) .   Chronic pain (05/07/2012) -continue narcotics and ibuprofen. Get physical therapy.  Abnormal LFTs - suspect due to fatty liver ds. Hepatitis panel pending     Polysubstance abuse (05/07/2012) -counseled   Severe peripheral neuropathy-patient is getting referred for rehabilitation  Code Status: Full code Family Communication:  Significant other Pettigrew,Irene [phone] Disposition Plan: To be determined     LOS: 11 days   Procedures:  Thoracic  lymph node biopsy - mediastinoscopy   Consultants    Infectious diseases  Antibiotics:  Vancomycin and zosyn completed a 8 days course   Subjective: Patient is feeling poorly   Objective: Filed Vitals:   05/17/12 1400 05/17/12 2103 05/18/12 0551 05/18/12 1319  BP: 156/92 136/75 171/87 123/79  Pulse: 82 89 104 83  Temp: 98.2 F (36.8 C) 98.4 F (36.9 C) 101.9 F (38.8 C) 97.6 F (36.4 C)  TempSrc: Oral Oral Oral Oral  Resp:  20 24 20   Height:      Weight:      SpO2: 94% 93% 92% 95%    Intake/Output Summary (Last 24 hours) at 05/18/12 1741 Last data filed at 05/18/12 1300  Gross per 24 hour  Intake    338 ml  Output   1075 ml  Net   -737 ml   Weight change:   Exam:  General: Awake, but lethargic, oriented x3, in no acute distress.  HEENT: No bruits, no goiter.  Scar in neck looks clean. Heart: Regular rate and rhythm, without murmurs, rubs, gallops.  Lungs: Good bilateral air movement.  Abdomen : soft, NT Bilateral lower sedative weakness left greater than right, intact sensation lower bilaterally   Data Reviewed: Basic Metabolic Panel:  Lab 05/18/12 5621 05/16/12 1225 05/13/12 0612 05/12/12 0513  NA 141 137 140 138  K 3.0* 3.6 -- --  CL 103 100 105 103  CO2 27 25 21 24   GLUCOSE 116* 112* 98 324*  BUN 8 10 16 19   CREATININE 0.74 0.81 0.85 0.95  CALCIUM 8.6 8.9 8.7 8.8  MG -- -- -- --  PHOS -- 2.5 -- --   Liver Function Tests:  Lab 05/18/12 0630 05/17/12  1647 05/16/12 1225 05/13/12 0612 05/12/12 0513  AST 97* 67* -- 56* 53*  ALT 129* 100* -- 203* 229*  ALKPHOS 148* 121* -- 84 92  BILITOT 1.4* 1.2 -- 0.4 0.3  PROT 6.2 5.7* -- 6.4 6.3  ALBUMIN 2.3* 2.1* 2.5* 2.8* 2.8*   No results found for this basename: LIPASE:5,AMYLASE:5 in the last 168 hours No results found for this basename: AMMONIA:5 in the last 168 hours CBC:  Lab 05/18/12 0630 05/13/12 0612 05/12/12 0513  WBC 10.6* 9.9 11.5*  NEUTROABS -- -- --  HGB 11.2* 13.1 12.2*  HCT 31.6*  38.4* 34.9*  MCV 89.5 91.4 91.6  PLT 293 190 187   Cardiac Enzymes:  Lab 05/17/12 0530  CKTOTAL 60  CKMB --  CKMBINDEX --  TROPONINI --   BNP: No components found with this basename: POCBNP:5 CBG:  Lab 05/18/12 1127 05/18/12 0959 05/18/12 0743 05/17/12 2206 05/17/12 1652  GLUCAP 121* 152* 132* 85 98    Recent Results (from the past 240 hour(s))  CULTURE, BLOOD (ROUTINE X 2)     Status: Normal   Collection Time   05/10/12  5:05 PM      Component Value Range Status Comment   Specimen Description BLOOD LEFT HAND   Final    Special Requests BOTTLES DRAWN AEROBIC AND ANAEROBIC 12CC   Final    Culture NO GROWTH 5 DAYS   Final    Report Status 05/15/2012 FINAL   Final   CULTURE, BLOOD (ROUTINE X 2)     Status: Normal   Collection Time   05/10/12  5:10 PM      Component Value Range Status Comment   Specimen Description BLOOD RIGHT ANTECUBITAL   Final    Special Requests BOTTLES DRAWN AEROBIC AND ANAEROBIC 12CC   Final    Culture NO GROWTH 5 DAYS   Final    Report Status 05/15/2012 FINAL   Final   TISSUE CULTURE     Status: Normal (Preliminary result)   Collection Time   05/15/12  7:30 AM      Component Value Range Status Comment   Specimen Description TISSUE LYMPH NODE   Final    Special Requests NO 2/MEDIASTINAL   Final    Gram Stain     Final    Value: FEW WBC PRESENT,BOTH PMN AND MONONUCLEAR     NO SQUAMOUS EPITHELIAL CELLS SEEN     NO ORGANISMS SEEN   Culture NO GROWTH 2 DAYS   Final    Report Status PENDING   Incomplete   AFB CULTURE WITH SMEAR     Status: Normal (Preliminary result)   Collection Time   05/15/12  7:30 AM      Component Value Range Status Comment   Specimen Description TISSUE LYMPH NODE   Final    Special Requests NO 2/MEDIASTINAL   Final    ACID FAST SMEAR NO ACID FAST BACILLI SEEN   Final    Culture     Final    Value: CULTURE WILL BE EXAMINED FOR 6 WEEKS BEFORE ISSUING A FINAL REPORT   Report Status PENDING   Incomplete   FUNGUS CULTURE W SMEAR      Status: Normal (Preliminary result)   Collection Time   05/15/12  7:30 AM      Component Value Range Status Comment   Specimen Description TISSUE LYMPH NODE   Final    Special Requests NO 2/MEDIASTINAL   Final    Fungal Smear NO YEAST  OR FUNGAL ELEMENTS SEEN   Final    Culture CULTURE IN PROGRESS FOR FOUR WEEKS   Final    Report Status PENDING   Incomplete      Studies: Ct Chest W Contrast  05/12/2012  *RADIOLOGY REPORT*  Clinical Data:  Fever unknown origin.  Chronic back pain.  Multiple sclerosis.  Weakness.  CT CHEST, ABDOMEN AND PELVIS WITH CONTRAST  Technique:  Multidetector CT imaging of the chest, abdomen and pelvis was performed following the standard protocol during bolus administration of intravenous contrast.  Contrast: 1 OMNIPAQUE IOHEXOL 300 MG/ML  SOLN (100 ml)  Comparison:  Multiple exams, including 05/07/2012  CT CHEST  Findings:  Right paratracheal node measures 1.3 cm in short axis, image 17 of series 3.  An AP window lymph node measures 1.6 cm in short axis, image 24 of series 3.  Right hilar adenopathy noted with one right hilar node measuring 1.5 cm in short axis, image 32 of series 3.  Left hilar adenopathy noted with several small calcifications, and also scattered infrahilar nodes.  Conglomerate subcarinal adenopathy measures 2.2 cm in short axis, image 30 of series 3.  Scattered small paraesophageal lymph nodes are present in the lower thorax.  Heart size is within normal limits.  A small hiatal hernia is present.  A 4 mm densely calcified right upper lobe pulmonary nodule on image 14 of series 4 is compatible with calcified granuloma.  Centrilobular emphysema noted.  There is mild atelectasis or scarring in the posterior basal segments of both lower lobes. Several additional small calcified right upper lobe granulomas are present.  There are several small calcified granulomas in the left lung.  IMPRESSION:  1.  Pathologic mediastinal and hilar adenopathy.  No airway thickening or  pleural nodularity to suggest pulmonary parenchymal findings of sarcoidosis.  The appearance could reflect lymphoma. Tissue diagnosis is likely warranted. 2.  Small hiatal hernia. 3.  Old granulomatous disease.  CT ABDOMEN AND PELVIS  Findings:  Diffuse mild low density in the liver suggests diffuse mild hepatic steatosis.  The spleen, adrenal glands, and pancreas appear unremarkable.  The gallbladder and biliary system appear unremarkable.  Small peripancreatic lymph nodes do not appear pathologically enlarged by size criteria.  The right kidney appears normal.  Several small hypodense lesions in the left kidney are technically too small to characterize although statistically likely represent cysts.  The renal calculi referenced on the prior ultrasound examination from 07/06/2001 are not observed on today's exam. No pathologic pelvic adenopathy is identified.  The appendix is within normal limits.  No significant colonic diverticular disease observed.  No dilated bowel noted.  Urinary bladder appears unremarkable.  Degenerative grade 1 anterolisthesis of L5 on S1 is noted.  IMPRESSION:  1.  The suspected diffuse mild hepatic steatosis. 2.  Several small hypodense lesions in the left kidney are likely cyst but technically too small to characterize. 3.  Degenerative grade 1 anterolisthesis of L5 on S1.  Original Report Authenticated By: Dellia Cloud, M.D.   Mr Laqueta Jean Wo Contrast  05/09/2012  *RADIOLOGY REPORT*  Clinical Data:  51 year old male with lower extremity weakness. History of multiple sclerosis.  MRI HEAD WITHOUT AND WITH CONTRAST MRI CERVICAL SPINE WITHOUT AND WITH CONTRAST  Technique:  Multiplanar, multiecho pulse sequences of the brain and surrounding structures, and cervical spine, to include the craniocervical junction and cervicothoracic junction, were obtained without and with intravenous contrast.  Contrast: 20mL MULTIHANCE GADOBENATE DIMEGLUMINE 529 MG/ML IV SOLN  Comparison:  Thoracic  spine study performed the previous day and reported separately.  Brain and cervical MRI 09/15/2010 and earlier.  MRI HEAD  Findings:  Normal cerebral volume. No restricted diffusion to suggest acute infarction.  No midline shift, mass effect, evidence of mass lesion, ventriculomegaly, extra-axial collection or acute intracranial hemorrhage.  Cervicomedullary junction and pituitary are within normal limits.  Major intracranial vascular flow voids are stable, dominant left vertebral artery.  Normal bone marrow signal.  No abnormal enhancement identified.  Chronically advanced T2 and FLAIR hyperintense foci throughout the brain are stable since 2011. Cerebral hemisphere lesions are oriented perpendicular to the lateral ventricles as before, typical in appearance for multiple sclerosis.  There is involvement of the brain stem as before. Cervical spine findings are below.  Orbit soft tissues are within normal limits.  Stable minor paranasal sinus mucosal thickening.  Mastoids remain clear. Negative scalp soft tissues.  IMPRESSION: 1.  Stable MRI appearance of chronic demyelinating disease of the brain.  No enhancement or evidence of acute demyelination. 2.  Cervical spine findings are below. 3.  No new intracranial abnormality.  MRI CERVICAL SPINE  Findings: Stable cervical vertebral height and alignment with preserved lordosis. No marrow edema or evidence of acute osseous abnormality.  Visualized paraspinal soft tissues are within normal limits.  Chronic T2 and STIR hyperintense right lateral spinal cord plaque at C3 is re-identified and not significantly changed.  Elsewhere the cervical spinal cord signal is within normal limits.  Upper thoracic spinal cord plaque is partially visible on the left at T2 and is also chronic.  No abnormal spinal cord or intradural enhancement to suggest acute demyelination.  Stable chronic predominately mild cervical spine degenerative changes.  There is minimal to mild spinal stenosis at  C6-C7 with moderate to severe bilateral foraminal stenosis again noted, related to disc bulge and ligament flavum hypertrophy.  There is a mild chronic disc bulge at C3-C4 with chronic uncovertebral hypertrophy and mild to moderate to bilateral foraminal stenosis.  IMPRESSION: 1.  Stable chronic demyelinating lesions in the spinal cord at C3 and T2.  No new spinal cord signal abnormality.  No enhancement to suggest acute demyelination. 2.  Stable mild for age cervical degenerative changes.  Original Report Authenticated By: Harley Hallmark, M.D.   Mr Cervical Spine W Wo Contrast  05/09/2012  *RADIOLOGY REPORT*  Clinical Data:  51 year old male with lower extremity weakness. History of multiple sclerosis.  MRI HEAD WITHOUT AND WITH CONTRAST MRI CERVICAL SPINE WITHOUT AND WITH CONTRAST  Technique:  Multiplanar, multiecho pulse sequences of the brain and surrounding structures, and cervical spine, to include the craniocervical junction and cervicothoracic junction, were obtained without and with intravenous contrast.  Contrast: 20mL MULTIHANCE GADOBENATE DIMEGLUMINE 529 MG/ML IV SOLN  Comparison:  Thoracic spine study performed the previous day and reported separately.  Brain and cervical MRI 09/15/2010 and earlier.  MRI HEAD  Findings:  Normal cerebral volume. No restricted diffusion to suggest acute infarction.  No midline shift, mass effect, evidence of mass lesion, ventriculomegaly, extra-axial collection or acute intracranial hemorrhage.  Cervicomedullary junction and pituitary are within normal limits.  Major intracranial vascular flow voids are stable, dominant left vertebral artery.  Normal bone marrow signal.  No abnormal enhancement identified.  Chronically advanced T2 and FLAIR hyperintense foci throughout the brain are stable since 2011. Cerebral hemisphere lesions are oriented perpendicular to the lateral ventricles as before, typical in appearance for multiple sclerosis.  There is involvement of the  brain stem as before.  Cervical spine findings are below.  Orbit soft tissues are within normal limits.  Stable minor paranasal sinus mucosal thickening.  Mastoids remain clear. Negative scalp soft tissues.  IMPRESSION: 1.  Stable MRI appearance of chronic demyelinating disease of the brain.  No enhancement or evidence of acute demyelination. 2.  Cervical spine findings are below. 3.  No new intracranial abnormality.  MRI CERVICAL SPINE  Findings: Stable cervical vertebral height and alignment with preserved lordosis. No marrow edema or evidence of acute osseous abnormality.  Visualized paraspinal soft tissues are within normal limits.  Chronic T2 and STIR hyperintense right lateral spinal cord plaque at C3 is re-identified and not significantly changed.  Elsewhere the cervical spinal cord signal is within normal limits.  Upper thoracic spinal cord plaque is partially visible on the left at T2 and is also chronic.  No abnormal spinal cord or intradural enhancement to suggest acute demyelination.  Stable chronic predominately mild cervical spine degenerative changes.  There is minimal to mild spinal stenosis at C6-C7 with moderate to severe bilateral foraminal stenosis again noted, related to disc bulge and ligament flavum hypertrophy.  There is a mild chronic disc bulge at C3-C4 with chronic uncovertebral hypertrophy and mild to moderate to bilateral foraminal stenosis.  IMPRESSION: 1.  Stable chronic demyelinating lesions in the spinal cord at C3 and T2.  No new spinal cord signal abnormality.  No enhancement to suggest acute demyelination. 2.  Stable mild for age cervical degenerative changes.  Original Report Authenticated By: Harley Hallmark, M.D.   Mr Thoracic Spine W Wo Contrast  05/08/2012  *RADIOLOGY REPORT*  Clinical Data: Lower extremity weakness.  Fever.  Multiple sclerosis.  MRI THORACIC SPINE WITHOUT AND WITH CONTRAST  Technique:  Multiplanar and multiecho pulse sequences of the thoracic spine were  obtained without and with intravenous contrast.  Contrast: 20mL MULTIHANCE GADOBENATE DIMEGLUMINE 529 MG/ML IV SOLN  Comparison: Cervical MRI dated 09/15/2010  Findings: The patient has a subtle plaque in the left posterior lateral aspect of the thoracic spinal cord at T2, unchanged since the cervical MRI dated 09/15/2010.  There are also small focal plaques in the posterior columns at T10 and centrally in the spinal cord at T10-11.  The spinal cord is not enlarged.  There is no pathologic enhancement of the spinal cord or plaques after contrast administration.  The osseous structures of the thoracic spine are normal.  Discs are normal.  No foraminal or spinal stenosis.  The paraspinal soft tissues are normal.  IMPRESSION:  1.  Plaques in the thoracic spinal cord at T2, T10, and T11, most consistent with multiple sclerosis. 2.  No evidence of transverse myelitis or spinal abscess.  Original Report Authenticated By: Gwynn Burly, M.D.   Mr Lumbar Spine W Wo Contrast  05/08/2012  *RADIOLOGY REPORT*  Clinical Data: Lower extremity weakness.  Fever.  Multiple sclerosis.  MRI LUMBAR SPINE WITHOUT AND WITH CONTRAST  Technique:  Multiplanar and multiecho pulse sequences of the lumbar spine were obtained without and with intravenous contrast.  Contrast: 20mL MULTIHANCE GADOBENATE DIMEGLUMINE 529 MG/ML IV SOLN  Comparison: MRI dated 02/25/2012  Findings: Scan extends from T11-12 through S2.  Tip of the conus is at L1-2 and appears normal.  T11-12:  Normal.  T12-L1:  Small disc bulge central and slightly to the right without neural impingement without change.  L1-2:  Small broad-based disc bulge with no neural impingement, unchanged.  L2-3:  Far lateral disc bulges to the right and left without neural  impingement and without change.  L3-4:  Far lateral annular tears of the right and left without neural impingement, unchanged.  Moderate bilateral facet arthritis. Slight narrowing of the spinal canal, unchanged.  L4-5:   Moderate bilateral facet hypertrophy.  Small annular tear to the left.  No neural impingement.  L5 S1:  5 mm spondylolisthesis with slight bulging of the uncovered disc with slight narrowing of the neural foramina, right greater than left, without focal neural impingement, unchanged.  IMPRESSION:   Multilevel degenerative disc disease without focal neural impingement.  No change since the prior exam.  No significant enhancement after contrast administration.  Original Report Authenticated By: Gwynn Burly, M.D.   Ct Abdomen Pelvis W Contrast  05/12/2012  *RADIOLOGY REPORT*  Clinical Data:  Fever unknown origin.  Chronic back pain.  Multiple sclerosis.  Weakness.  CT CHEST, ABDOMEN AND PELVIS WITH CONTRAST  Technique:  Multidetector CT imaging of the chest, abdomen and pelvis was performed following the standard protocol during bolus administration of intravenous contrast.  Contrast: 1 OMNIPAQUE IOHEXOL 300 MG/ML  SOLN (100 ml)  Comparison:  Multiple exams, including 05/07/2012  CT CHEST  Findings:  Right paratracheal node measures 1.3 cm in short axis, image 17 of series 3.  An AP window lymph node measures 1.6 cm in short axis, image 24 of series 3.  Right hilar adenopathy noted with one right hilar node measuring 1.5 cm in short axis, image 32 of series 3.  Left hilar adenopathy noted with several small calcifications, and also scattered infrahilar nodes.  Conglomerate subcarinal adenopathy measures 2.2 cm in short axis, image 30 of series 3.  Scattered small paraesophageal lymph nodes are present in the lower thorax.  Heart size is within normal limits.  A small hiatal hernia is present.  A 4 mm densely calcified right upper lobe pulmonary nodule on image 14 of series 4 is compatible with calcified granuloma.  Centrilobular emphysema noted.  There is mild atelectasis or scarring in the posterior basal segments of both lower lobes. Several additional small calcified right upper lobe granulomas are present.   There are several small calcified granulomas in the left lung.  IMPRESSION:  1.  Pathologic mediastinal and hilar adenopathy.  No airway thickening or pleural nodularity to suggest pulmonary parenchymal findings of sarcoidosis.  The appearance could reflect lymphoma. Tissue diagnosis is likely warranted. 2.  Small hiatal hernia. 3.  Old granulomatous disease.  CT ABDOMEN AND PELVIS  Findings:  Diffuse mild low density in the liver suggests diffuse mild hepatic steatosis.  The spleen, adrenal glands, and pancreas appear unremarkable.  The gallbladder and biliary system appear unremarkable.  Small peripancreatic lymph nodes do not appear pathologically enlarged by size criteria.  The right kidney appears normal.  Several small hypodense lesions in the left kidney are technically too small to characterize although statistically likely represent cysts.  The renal calculi referenced on the prior ultrasound examination from 07/06/2001 are not observed on today's exam. No pathologic pelvic adenopathy is identified.  The appendix is within normal limits.  No significant colonic diverticular disease observed.  No dilated bowel noted.  Urinary bladder appears unremarkable.  Degenerative grade 1 anterolisthesis of L5 on S1 is noted.  IMPRESSION:  1.  The suspected diffuse mild hepatic steatosis. 2.  Several small hypodense lesions in the left kidney are likely cyst but technically too small to characterize. 3.  Degenerative grade 1 anterolisthesis of L5 on S1.  Original Report Authenticated By: Dellia Cloud, M.D.  US Venous Img Lower Bilateral  05/08/2012  *RADIOLOGY REPORT*  Clinical Data: Bilateral leg weakness and hip pain, history of smoking, evaluate for DVT  BILATERAL LOWER EXTREMITY VENOUS DUPLEX ULTRASOUND  Technique:  Gray-scale sonography with graded compression, as well as color Doppler and duplex ultrasound, were performed to evaluate the deep venous system of both lower extremities from the level of the  common femoral vein through the popliteal and proximal calf veins.  Spectral Doppler was utilized to evaluate flow at rest and with distal augmentation maneuvers.  Comparison:  None.  Findings:  Normal compressibility of bilateral common femoral, superficial femoral, and popliteal veins is demonstrated, as well as the visualized proximal calf veins.  No filling defects to suggest DVT on grayscale or color Doppler imaging.  Doppler waveforms show normal direction of venous flow, normal respiratory phasicity and response to augmentation.  IMPRESSION: No evidence of deep vein thrombosis, within either lower extremity.  Original Report Authenticated By: Waynard Reeds, M.D.   Dg Chest Port 1 View  05/07/2012  *RADIOLOGY REPORT*  Clinical Data: Fever.  Infiltrate.  PORTABLE CHEST - 1 VIEW  Comparison: Multiple priors most recently 05/17/2012 at 1535 hours. Chest CT 01/13/2007.  Findings: Left costophrenic angle excluded from view.  Left basilar subsegmental atelectasis.  Prominent right pericardial fat pad. Cardiopericardial silhouette is within normal limits.  No airspace disease.  No effusion.  Calcified granuloma in the right upper lobe and punctate calcifications of the mediastinal lymph nodes again noted compatible with old granulomatous disease.  IMPRESSION: No acute cardiopulmonary disease.  Subsegmental left basilar atelectasis.  Original Report Authenticated By: Andreas Newport, M.D.   Dg Chest Port 1 View  05/07/2012  *RADIOLOGY REPORT*  Clinical Data: Weakness.  Fall.  Multiple sclerosis.  Smoker.  PORTABLE CHEST - 1 VIEW  Comparison: 06/14/2011 chest radiograph.  CT chest 01/13/2007  Findings: The normal heart size and pulmonary vascularity. Calcified granulomas in the mid lungs.  Nodular hilar opacities consistent with prominent lymph nodes and granulomas as seen on previous chest CT.  Mild emphysematous changes in the upper lungs. No focal airspace consolidation.  No blunting of costophrenic angles.  No  pneumothorax.  Old left rib fracture.  IMPRESSION: Calcified granulomas with calcified hilar lymph nodes. Emphysematous changes.  No evidence of active pulmonary disease.  Original Report Authenticated By: Marlon Pel, M.D.   Dg Fluoro Guide Lumbar Puncture  05/11/2012  *RADIOLOGY REPORT*  Clinical Data: Multiple Sclerosis, fever and headaches.  DIAGNOSTIC LUMBAR PUNCTURE UNDER FLUOROSCOPIC GUIDANCE  Fluoroscopy time:  1.8. minutes.  Technique:  Informed consent was obtained from the patient prior to the procedure, including potential complications of headache, allergy, and pain. Lovenox was withheld for 1.5 days.  With the patient prone oblique, the lower back was prepped with Betadine. 1% Lidocaine was used for local anesthesia.  Lumbar puncture was performed at the L4-L5 level using a 12.7 cm 20  gauge needle with return of blood tinged CSF with an opening pressure of 19.2. cm water.   Only three ml of CSF were obtained for laboratory studies, even after placing the  patient in reverse Trendelenburg position. The patient tolerated the procedure well and there were no apparent complications.  IMPRESSION: Slightly elevated opening pressure of 19.2 cm water.  Approximately 3 ml of blood-tinged CSF was obtained for pathological analysis.  Original Report Authenticated By: Brandon Melnick, M.D.   Results for GARO, HEIDELBERG (MRN [id number]) as of 05/17/2012 07:43  Ref. Range 05/11/2012 13:35  RBC Count, CSF Latest Range: 0 /cu mm 5011 (H)  WBC, CSF Latest Range: 0-5 /cu mm 4  Segmented Neutrophils-CSF Latest Range: 0-6 % TOO FEW TO COUNT, SMEAR AVAILABLE FOR REVIEW  Appearance, CSF Latest Range: CLEAR  TURBID (A)  Color, CSF Latest Range: COLORLESS  BLOODY (A)  Supernatant No range found NOT INDICATED  Tube # No range found 1  Blood cultures from 8/4: no growth final  Urine cultures: no growth final  HIV Ab: nonreactive  HIV viral load: neg  RPR: nonreactive  Blood cx 8/7: no growth final  LP  from 8/7, CSF culture: Not done due to insufficient quantity  LFTs: AST 56 (8/13)and ALT 203 and 229 (8/9 and 8/10)  CBC: WBC range between 12.6 -9.9( 8/4 -8/10) and neutrophil 86  AFB culture from lymph node biopsy: Pending  TSH: normal  ANA-negative  RF- pending  CMV, EVB,TB quantiferon -Pending  Blastomyces and histoplasma ag sent to [facility]  Cryptococcal antigen- pending  ACE-pending  CK- normal range  Hepatitis panel-pending  SPE- pending     Scheduled Meds:    . aspirin EC  81 mg Oral Daily  . carbamazepine  200 mg Oral TID  . Chlorhexidine Gluconate Cloth  6 each Topical Once  . cholecalciferol  2,000 Units Oral BH-q7a  . docusate sodium  100 mg Oral BID  . gabapentin  600 mg Oral TID  . glatiramer  20 mg Subcutaneous Daily  . ibuprofen  600 mg Oral TID  . insulin aspart  0-15 Units Subcutaneous TID WC  . insulin aspart  0-5 Units Subcutaneous QHS  . itraconazole  200 mg Oral TID WC  . itraconazole  200 mg Oral BID WC  . pantoprazole  40 mg Oral BID AC  . potassium chloride  40 mEq Oral Once  . predniSONE  40 mg Oral Q breakfast  . sodium chloride  3 mL Intravenous Q12H   Continuous Infusions:    Malyiah Fellows [phone] If 7PM-7AM, please contact night-coverage [email or web address] Password [facility] 05/18/2012, 5:41 PM

## 2012-05-19 DIAGNOSIS — G8220 Paraplegia, unspecified: Secondary | ICD-10-CM | POA: Diagnosis present

## 2012-05-19 DIAGNOSIS — D869 Sarcoidosis, unspecified: Principal | ICD-10-CM | POA: Diagnosis present

## 2012-05-19 DIAGNOSIS — R509 Fever, unspecified: Secondary | ICD-10-CM

## 2012-05-19 LAB — PROTEIN ELECTROPHORESIS, SERUM
Alpha-1-Globulin: 11.6 % — ABNORMAL HIGH (ref 2.9–4.9)
Alpha-2-Globulin: 18.8 % — ABNORMAL HIGH (ref 7.1–11.8)
Gamma Globulin: 12.7 % (ref 11.1–18.8)
Total Protein ELP: 5.6 g/dL — ABNORMAL LOW (ref 6.0–8.3)

## 2012-05-19 LAB — MPO/PR-3 (ANCA) ANTIBODIES
Myeloperoxidase Abs: <1 AU/mL (ref <20)
Serine Protease 3: 1 AU/mL (ref <20)

## 2012-05-19 LAB — TISSUE CULTURE

## 2012-05-19 LAB — GLUCOSE, CAPILLARY
Glucose-Capillary: 100 mg/dL — ABNORMAL HIGH (ref 70–99)
Glucose-Capillary: 132 mg/dL — ABNORMAL HIGH (ref 70–99)

## 2012-05-19 MED ORDER — ITRACONAZOLE 100 MG PO CAPS
200.0000 mg | ORAL_CAPSULE | Freq: Two times a day (BID) | ORAL | Status: AC
Start: 2012-05-19 — End: 2012-06-02

## 2012-05-19 MED ORDER — DSS 100 MG PO CAPS: 100.0000 mg | ORAL_CAPSULE | Freq: Two times a day (BID) | ORAL | Status: AC

## 2012-05-19 MED ORDER — INSULIN ASPART 100 UNIT/ML ~~LOC~~ SOLN: 0.0000 [IU] | Freq: Three times a day (TID) | SUBCUTANEOUS | Status: DC

## 2012-05-19 MED ORDER — POTASSIUM CHLORIDE CRYS ER 20 MEQ PO TBCR
40.0000 meq | EXTENDED_RELEASE_TABLET | Freq: Once | ORAL | Status: AC
  Administered 2012-05-19: 40 meq via ORAL
  Filled 2012-05-19: qty 2

## 2012-05-19 MED ORDER — POLYETHYLENE GLYCOL 3350 17 G PO PACK: 17.0000 g | PACK | Freq: Once | ORAL | Status: AC

## 2012-05-19 MED ORDER — HYDROCODONE-ACETAMINOPHEN 7.5-750 MG PO TABS: 1.0000 | ORAL_TABLET | Freq: Four times a day (QID) | ORAL | Status: DC | PRN

## 2012-05-19 MED ORDER — HYDROCODONE-ACETAMINOPHEN 10-325 MG PO TABS: 1.0000 | ORAL_TABLET | ORAL | Status: DC | PRN

## 2012-05-19 MED ORDER — PREDNISONE 20 MG PO TABS: 40.0000 mg | ORAL_TABLET | Freq: Every day | ORAL | Status: AC

## 2012-05-19 MED ORDER — ACETAMINOPHEN 325 MG PO TABS: 650.0000 mg | ORAL_TABLET | Freq: Four times a day (QID) | ORAL | Status: AC | PRN

## 2012-05-19 MED ORDER — PANTOPRAZOLE SODIUM 40 MG PO TBEC: 40.0000 mg | DELAYED_RELEASE_TABLET | Freq: Two times a day (BID) | ORAL | Status: DC

## 2012-05-19 NOTE — Progress Notes (Signed)
Clinical Social Worker continuing to follow for discharge planning. Pt plans to discharge to [facility]. Clinical Child psychotherapist received phone call from [employer] confirming insurance authorization approved and insurance provided ambulance authorization. Per MD, pt will be medically ready for discharge on Saturday 8/17. Clinical Social Worker notified facility who confirmed pt can admit to facility on Saturday. Clinical Social Worker discussed with pt and pt significant other at bedside. Weekend Clinical Social Worker to facilitate pt discharge needs on Saturday 8/17.  Jacklynn Lewis, MSW, LCSWA  Clinical Social Work [phone]

## 2012-05-19 NOTE — Progress Notes (Signed)
[facility] PROGRESS NOTE  BROOKS KINNAN WUJ:[id number] DOB: 1961/05/01 DOA: 05/07/2012   Assessment/Plan:  presumedMultiple sclerosis exacerbation (05/07/2012) - not confirmed by MRI findings of the brain and cervical spine -completed a five-day course of Solu-Medrol. Just in case -CIR consult obtained but the patient was not found eligible for inpatient rehabilitation - Patient is back on Copaxone - his chronic maintenance medication  Fever (05/07/2012) - Empiric treatment with vancomycin zosyn stoped, completed a 8 day course. A CT scan of the chest was positive for  lymphadenopathy, HIV negative. The patient was transferred to [facility] and he underwent mediastinoscopy with biopsy of a mediastinal lymph node on 8/12. The results were negative for lymphoma. Negative bacterial cultures till date. Fungal cultures sent out. Blood cultures x2 continue to be negative CSF cultures are negative urine cultures are negative. ANA was obtained on August 13 and it was negative. ANCA antibodies were negative for vasculitis.  The patient was not on any new drugs recently that will cause fever. - appreciate ID input. The patient was started empirically on itraconazole on August 14, while we were waiting for final results of the fungal cultures. 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 Discuss with Dr. Dierdre Forth from rheumatology and the plan is to start prednisone 40 mg daily on 05/18/12 and followup closely  Hypokalemia - replete and follow BMP .   Chronic pain weakness and deconditioning  -continue narcotics and ibuprofen prn . The patient was seen by physical therapy and he will be transferring to a nursing home for short-term rehabilitation  Abnormal LFTs - suspect due to fatty liver ds and possible due to sarcoidosis. Hepatitis panel  negative for HBV, HCV, HAV   Polysubstance abuse (05/07/2012) -counseled   Severe peripheral neuropathy coupled with chronic deficits from multiple sclerosis -patient is getting referred for rehabilitation  Code Status: Full code Family Communication:  Significant other Pettigrew,Irene [phone] Disposition Plan: To be determined     LOS: 12 days   Procedures:  Thoracic lymph node biopsy - mediastinoscopy   Consultants    Infectious diseases  Antibiotics:  Vancomycin and zosyn completed a 8 days course   Subjective: Patient is feeling much better, stronger, more optimistic  Objective: Filed Vitals:   05/18/12 2052 05/19/12 0605 05/19/12 1426 05/19/12 1516  BP: 126/70 153/84 127/80   Pulse: 72 76 76 81  Temp: 98.1 F (36.7 C) 98.3 F (36.8 C) 98.4 F (36.9 C)   TempSrc: Oral Oral Oral   Resp: 20 18 18    Height:      Weight:      SpO2: 94% 96% 93% 93%    Intake/Output Summary (Last 24 hours) at 05/19/12 1631 Last data filed at 05/19/12 0938  Gross per 24 hour  Intake    530 ml  Output   2400 ml  Net  -1870 ml   Weight change:   Exam:  General: Alert and oriented x3, in no acute distress.  HEENT: No bruits, no goiter.  Scar in neck looks clean. Heart: Regular rate and rhythm, without murmurs, rubs, gallops.  Lungs: Good bilateral air movement.  Abdomen : soft, NT Bilateral lower extremity weakness left greater than right, intact sensation lower extremities  bilaterally   Data Reviewed: Basic Metabolic Panel:  Lab 05/18/12 7846 05/16/12 1225 05/13/12 0612  NA 141 137 140  K 3.0* 3.6 --  CL 103 100 105  CO2 27 25 21   GLUCOSE 116* 112* 98  BUN 8 10 16   CREATININE 0.74 0.81 0.85  CALCIUM 8.6 8.9 8.7  MG -- -- --  PHOS -- 2.5 --   Liver Function Tests:  Lab 05/18/12 0630 05/17/12 1647 05/16/12 1225 05/13/12 0612  AST 97* 67* -- 56*  ALT 129* 100* -- 203*  ALKPHOS 148* 121* -- 84  BILITOT 1.4* 1.2 -- 0.4  PROT 6.2 5.7* -- 6.4    ALBUMIN 2.3* 2.1* 2.5* 2.8*   No results found for this basename: LIPASE:5,AMYLASE:5 in the last 168 hours No results found for this basename: AMMONIA:5 in the last 168 hours CBC:  Lab 05/18/12 0630 05/13/12 0612  WBC 10.6* 9.9  NEUTROABS -- --  HGB 11.2* 13.1  HCT 31.6* 38.4*  MCV 89.5 91.4  PLT 293 190   Cardiac Enzymes:  Lab 05/17/12 0530  CKTOTAL 60  CKMB --  CKMBINDEX --  TROPONINI --   BNP: No components found with this basename: POCBNP:5 CBG:  Lab 05/19/12 1117 05/19/12 0748 05/18/12 2147 05/18/12 1733 05/18/12 1127  GLUCAP 132* 100* 153* 125* 121*    Recent Results (from the past 240 hour(s))  CULTURE, BLOOD (ROUTINE X 2)     Status: Normal   Collection Time   05/10/12  5:05 PM      Component Value Range Status Comment   Specimen Description BLOOD LEFT HAND   Final    Special Requests BOTTLES DRAWN AEROBIC AND ANAEROBIC 12CC   Final    Culture NO GROWTH 5 DAYS   Final    Report Status 05/15/2012 FINAL   Final   CULTURE, BLOOD (ROUTINE X 2)     Status: Normal   Collection Time   05/10/12  5:10 PM      Component Value Range Status Comment   Specimen Description BLOOD RIGHT ANTECUBITAL   Final    Special Requests BOTTLES DRAWN AEROBIC AND ANAEROBIC 12CC   Final    Culture NO GROWTH 5 DAYS   Final    Report Status 05/15/2012 FINAL   Final   TISSUE CULTURE     Status: Normal   Collection Time   05/15/12  7:30 AM      Component Value Range Status Comment   Specimen Description TISSUE LYMPH NODE   Final    Special Requests NO 2/MEDIASTINAL   Final    Gram Stain     Final    Value: FEW WBC PRESENT,BOTH PMN AND MONONUCLEAR     NO SQUAMOUS EPITHELIAL CELLS SEEN     NO ORGANISMS SEEN   Culture NO GROWTH 3 DAYS   Final    Report Status 05/19/2012 FINAL   Final   AFB CULTURE WITH SMEAR     Status: Normal (Preliminary result)   Collection Time   05/15/12  7:30 AM      Component Value Range Status Comment   Specimen Description TISSUE LYMPH NODE   Final    Special  Requests NO 2/MEDIASTINAL   Final    ACID FAST SMEAR NO ACID FAST BACILLI SEEN   Final    Culture     Final    Value: CULTURE WILL BE EXAMINED FOR 6 WEEKS BEFORE ISSUING A FINAL REPORT   Report Status PENDING   Incomplete   FUNGUS CULTURE W SMEAR     Status: Normal (Preliminary result)   Collection Time   05/15/12  7:30  AM      Component Value Range Status Comment   Specimen Description TISSUE LYMPH NODE   Final    Special Requests NO 2/MEDIASTINAL   Final    Fungal Smear NO YEAST OR FUNGAL ELEMENTS SEEN   Final    Culture CULTURE IN PROGRESS FOR FOUR WEEKS   Final    Report Status PENDING   Incomplete      Studies: Ct Chest W Contrast  05/12/2012  *RADIOLOGY REPORT*  Clinical Data:  Fever unknown origin.  Chronic back pain.  Multiple sclerosis.  Weakness.  CT CHEST, ABDOMEN AND PELVIS WITH CONTRAST  Technique:  Multidetector CT imaging of the chest, abdomen and pelvis was performed following the standard protocol during bolus administration of intravenous contrast.  Contrast: 1 OMNIPAQUE IOHEXOL 300 MG/ML  SOLN (100 ml)  Comparison:  Multiple exams, including 05/07/2012  CT CHEST  Findings:  Right paratracheal node measures 1.3 cm in short axis, image 17 of series 3.  An AP window lymph node measures 1.6 cm in short axis, image 24 of series 3.  Right hilar adenopathy noted with one right hilar node measuring 1.5 cm in short axis, image 32 of series 3.  Left hilar adenopathy noted with several small calcifications, and also scattered infrahilar nodes.  Conglomerate subcarinal adenopathy measures 2.2 cm in short axis, image 30 of series 3.  Scattered small paraesophageal lymph nodes are present in the lower thorax.  Heart size is within normal limits.  A small hiatal hernia is present.  A 4 mm densely calcified right upper lobe pulmonary nodule on image 14 of series 4 is compatible with calcified granuloma.  Centrilobular emphysema noted.  There is mild atelectasis or scarring in the posterior basal  segments of both lower lobes. Several additional small calcified right upper lobe granulomas are present.  There are several small calcified granulomas in the left lung.  IMPRESSION:  1.  Pathologic mediastinal and hilar adenopathy.  No airway thickening or pleural nodularity to suggest pulmonary parenchymal findings of sarcoidosis.  The appearance could reflect lymphoma. Tissue diagnosis is likely warranted. 2.  Small hiatal hernia. 3.  Old granulomatous disease.  CT ABDOMEN AND PELVIS  Findings:  Diffuse mild low density in the liver suggests diffuse mild hepatic steatosis.  The spleen, adrenal glands, and pancreas appear unremarkable.  The gallbladder and biliary system appear unremarkable.  Small peripancreatic lymph nodes do not appear pathologically enlarged by size criteria.  The right kidney appears normal.  Several small hypodense lesions in the left kidney are technically too small to characterize although statistically likely represent cysts.  The renal calculi referenced on the prior ultrasound examination from 07/06/2001 are not observed on today's exam. No pathologic pelvic adenopathy is identified.  The appendix is within normal limits.  No significant colonic diverticular disease observed.  No dilated bowel noted.  Urinary bladder appears unremarkable.  Degenerative grade 1 anterolisthesis of L5 on S1 is noted.  IMPRESSION:  1.  The suspected diffuse mild hepatic steatosis. 2.  Several small hypodense lesions in the left kidney are likely cyst but technically too small to characterize. 3.  Degenerative grade 1 anterolisthesis of L5 on S1.  Original Report Authenticated By: Dellia Cloud, M.D.   Mr Laqueta Jean Wo Contrast  05/09/2012  *RADIOLOGY REPORT*  Clinical Data:  51 year old male with lower extremity weakness. History of multiple sclerosis.  MRI HEAD WITHOUT AND WITH CONTRAST MRI CERVICAL SPINE WITHOUT AND WITH CONTRAST  Technique:  Multiplanar, multiecho pulse  sequences of the brain and  surrounding structures, and cervical spine, to include the craniocervical junction and cervicothoracic junction, were obtained without and with intravenous contrast.  Contrast: 20mL MULTIHANCE GADOBENATE DIMEGLUMINE 529 MG/ML IV SOLN  Comparison:  Thoracic spine study performed the previous day and reported separately.  Brain and cervical MRI 09/15/2010 and earlier.  MRI HEAD  Findings:  Normal cerebral volume. No restricted diffusion to suggest acute infarction.  No midline shift, mass effect, evidence of mass lesion, ventriculomegaly, extra-axial collection or acute intracranial hemorrhage.  Cervicomedullary junction and pituitary are within normal limits.  Major intracranial vascular flow voids are stable, dominant left vertebral artery.  Normal bone marrow signal.  No abnormal enhancement identified.  Chronically advanced T2 and FLAIR hyperintense foci throughout the brain are stable since 2011. Cerebral hemisphere lesions are oriented perpendicular to the lateral ventricles as before, typical in appearance for multiple sclerosis.  There is involvement of the brain stem as before. Cervical spine findings are below.  Orbit soft tissues are within normal limits.  Stable minor paranasal sinus mucosal thickening.  Mastoids remain clear. Negative scalp soft tissues.  IMPRESSION: 1.  Stable MRI appearance of chronic demyelinating disease of the brain.  No enhancement or evidence of acute demyelination. 2.  Cervical spine findings are below. 3.  No new intracranial abnormality.  MRI CERVICAL SPINE  Findings: Stable cervical vertebral height and alignment with preserved lordosis. No marrow edema or evidence of acute osseous abnormality.  Visualized paraspinal soft tissues are within normal limits.  Chronic T2 and STIR hyperintense right lateral spinal cord plaque at C3 is re-identified and not significantly changed.  Elsewhere the cervical spinal cord signal is within normal limits.  Upper thoracic spinal cord plaque is  partially visible on the left at T2 and is also chronic.  No abnormal spinal cord or intradural enhancement to suggest acute demyelination.  Stable chronic predominately mild cervical spine degenerative changes.  There is minimal to mild spinal stenosis at C6-C7 with moderate to severe bilateral foraminal stenosis again noted, related to disc bulge and ligament flavum hypertrophy.  There is a mild chronic disc bulge at C3-C4 with chronic uncovertebral hypertrophy and mild to moderate to bilateral foraminal stenosis.  IMPRESSION: 1.  Stable chronic demyelinating lesions in the spinal cord at C3 and T2.  No new spinal cord signal abnormality.  No enhancement to suggest acute demyelination. 2.  Stable mild for age cervical degenerative changes.  Original Report Authenticated By: Harley Hallmark, M.D.   Mr Cervical Spine W Wo Contrast  05/09/2012  *RADIOLOGY REPORT*  Clinical Data:  51 year old male with lower extremity weakness. History of multiple sclerosis.  MRI HEAD WITHOUT AND WITH CONTRAST MRI CERVICAL SPINE WITHOUT AND WITH CONTRAST  Technique:  Multiplanar, multiecho pulse sequences of the brain and surrounding structures, and cervical spine, to include the craniocervical junction and cervicothoracic junction, were obtained without and with intravenous contrast.  Contrast: 20mL MULTIHANCE GADOBENATE DIMEGLUMINE 529 MG/ML IV SOLN  Comparison:  Thoracic spine study performed the previous day and reported separately.  Brain and cervical MRI 09/15/2010 and earlier.  MRI HEAD  Findings:  Normal cerebral volume. No restricted diffusion to suggest acute infarction.  No midline shift, mass effect, evidence of mass lesion, ventriculomegaly, extra-axial collection or acute intracranial hemorrhage.  Cervicomedullary junction and pituitary are within normal limits.  Major intracranial vascular flow voids are stable, dominant left vertebral artery.  Normal bone marrow signal.  No abnormal enhancement identified.   Chronically advanced T2 and  FLAIR hyperintense foci throughout the brain are stable since 2011. Cerebral hemisphere lesions are oriented perpendicular to the lateral ventricles as before, typical in appearance for multiple sclerosis.  There is involvement of the brain stem as before. Cervical spine findings are below.  Orbit soft tissues are within normal limits.  Stable minor paranasal sinus mucosal thickening.  Mastoids remain clear. Negative scalp soft tissues.  IMPRESSION: 1.  Stable MRI appearance of chronic demyelinating disease of the brain.  No enhancement or evidence of acute demyelination. 2.  Cervical spine findings are below. 3.  No new intracranial abnormality.  MRI CERVICAL SPINE  Findings: Stable cervical vertebral height and alignment with preserved lordosis. No marrow edema or evidence of acute osseous abnormality.  Visualized paraspinal soft tissues are within normal limits.  Chronic T2 and STIR hyperintense right lateral spinal cord plaque at C3 is re-identified and not significantly changed.  Elsewhere the cervical spinal cord signal is within normal limits.  Upper thoracic spinal cord plaque is partially visible on the left at T2 and is also chronic.  No abnormal spinal cord or intradural enhancement to suggest acute demyelination.  Stable chronic predominately mild cervical spine degenerative changes.  There is minimal to mild spinal stenosis at C6-C7 with moderate to severe bilateral foraminal stenosis again noted, related to disc bulge and ligament flavum hypertrophy.  There is a mild chronic disc bulge at C3-C4 with chronic uncovertebral hypertrophy and mild to moderate to bilateral foraminal stenosis.  IMPRESSION: 1.  Stable chronic demyelinating lesions in the spinal cord at C3 and T2.  No new spinal cord signal abnormality.  No enhancement to suggest acute demyelination. 2.  Stable mild for age cervical degenerative changes.  Original Report Authenticated By: Harley Hallmark, M.D.   Mr  Thoracic Spine W Wo Contrast  05/08/2012  *RADIOLOGY REPORT*  Clinical Data: Lower extremity weakness.  Fever.  Multiple sclerosis.  MRI THORACIC SPINE WITHOUT AND WITH CONTRAST  Technique:  Multiplanar and multiecho pulse sequences of the thoracic spine were obtained without and with intravenous contrast.  Contrast: 20mL MULTIHANCE GADOBENATE DIMEGLUMINE 529 MG/ML IV SOLN  Comparison: Cervical MRI dated 09/15/2010  Findings: The patient has a subtle plaque in the left posterior lateral aspect of the thoracic spinal cord at T2, unchanged since the cervical MRI dated 09/15/2010.  There are also small focal plaques in the posterior columns at T10 and centrally in the spinal cord at T10-11.  The spinal cord is not enlarged.  There is no pathologic enhancement of the spinal cord or plaques after contrast administration.  The osseous structures of the thoracic spine are normal.  Discs are normal.  No foraminal or spinal stenosis.  The paraspinal soft tissues are normal.  IMPRESSION:  1.  Plaques in the thoracic spinal cord at T2, T10, and T11, most consistent with multiple sclerosis. 2.  No evidence of transverse myelitis or spinal abscess.  Original Report Authenticated By: Gwynn Burly, M.D.   Mr Lumbar Spine W Wo Contrast  05/08/2012  *RADIOLOGY REPORT*  Clinical Data: Lower extremity weakness.  Fever.  Multiple sclerosis.  MRI LUMBAR SPINE WITHOUT AND WITH CONTRAST  Technique:  Multiplanar and multiecho pulse sequences of the lumbar spine were obtained without and with intravenous contrast.  Contrast: 20mL MULTIHANCE GADOBENATE DIMEGLUMINE 529 MG/ML IV SOLN  Comparison: MRI dated 02/25/2012  Findings: Scan extends from T11-12 through S2.  Tip of the conus is at L1-2 and appears normal.  T11-12:  Normal.  T12-L1:  Small disc  bulge central and slightly to the right without neural impingement without change.  L1-2:  Small broad-based disc bulge with no neural impingement, unchanged.  L2-3:  Far lateral disc bulges  to the right and left without neural impingement and without change.  L3-4:  Far lateral annular tears of the right and left without neural impingement, unchanged.  Moderate bilateral facet arthritis. Slight narrowing of the spinal canal, unchanged.  L4-5:  Moderate bilateral facet hypertrophy.  Small annular tear to the left.  No neural impingement.  L5 S1:  5 mm spondylolisthesis with slight bulging of the uncovered disc with slight narrowing of the neural foramina, right greater than left, without focal neural impingement, unchanged.  IMPRESSION:   Multilevel degenerative disc disease without focal neural impingement.  No change since the prior exam.  No significant enhancement after contrast administration.  Original Report Authenticated By: Gwynn Burly, M.D.   Ct Abdomen Pelvis W Contrast  05/12/2012  *RADIOLOGY REPORT*  Clinical Data:  Fever unknown origin.  Chronic back pain.  Multiple sclerosis.  Weakness.  CT CHEST, ABDOMEN AND PELVIS WITH CONTRAST  Technique:  Multidetector CT imaging of the chest, abdomen and pelvis was performed following the standard protocol during bolus administration of intravenous contrast.  Contrast: 1 OMNIPAQUE IOHEXOL 300 MG/ML  SOLN (100 ml)  Comparison:  Multiple exams, including 05/07/2012  CT CHEST  Findings:  Right paratracheal node measures 1.3 cm in short axis, image 17 of series 3.  An AP window lymph node measures 1.6 cm in short axis, image 24 of series 3.  Right hilar adenopathy noted with one right hilar node measuring 1.5 cm in short axis, image 32 of series 3.  Left hilar adenopathy noted with several small calcifications, and also scattered infrahilar nodes.  Conglomerate subcarinal adenopathy measures 2.2 cm in short axis, image 30 of series 3.  Scattered small paraesophageal lymph nodes are present in the lower thorax.  Heart size is within normal limits.  A small hiatal hernia is present.  A 4 mm densely calcified right upper lobe pulmonary nodule on  image 14 of series 4 is compatible with calcified granuloma.  Centrilobular emphysema noted.  There is mild atelectasis or scarring in the posterior basal segments of both lower lobes. Several additional small calcified right upper lobe granulomas are present.  There are several small calcified granulomas in the left lung.  IMPRESSION:  1.  Pathologic mediastinal and hilar adenopathy.  No airway thickening or pleural nodularity to suggest pulmonary parenchymal findings of sarcoidosis.  The appearance could reflect lymphoma. Tissue diagnosis is likely warranted. 2.  Small hiatal hernia. 3.  Old granulomatous disease.  CT ABDOMEN AND PELVIS  Findings:  Diffuse mild low density in the liver suggests diffuse mild hepatic steatosis.  The spleen, adrenal glands, and pancreas appear unremarkable.  The gallbladder and biliary system appear unremarkable.  Small peripancreatic lymph nodes do not appear pathologically enlarged by size criteria.  The right kidney appears normal.  Several small hypodense lesions in the left kidney are technically too small to characterize although statistically likely represent cysts.  The renal calculi referenced on the prior ultrasound examination from 07/06/2001 are not observed on today's exam. No pathologic pelvic adenopathy is identified.  The appendix is within normal limits.  No significant colonic diverticular disease observed.  No dilated bowel noted.  Urinary bladder appears unremarkable.  Degenerative grade 1 anterolisthesis of L5 on S1 is noted.  IMPRESSION:  1.  The suspected diffuse mild hepatic steatosis.  2.  Several small hypodense lesions in the left kidney are likely cyst but technically too small to characterize. 3.  Degenerative grade 1 anterolisthesis of L5 on S1.  Original Report Authenticated By: Dellia Cloud, M.D.   US Venous Img Lower Bilateral  05/08/2012  *RADIOLOGY REPORT*  Clinical Data: Bilateral leg weakness and hip pain, history of smoking, evaluate for  DVT  BILATERAL LOWER EXTREMITY VENOUS DUPLEX ULTRASOUND  Technique:  Gray-scale sonography with graded compression, as well as color Doppler and duplex ultrasound, were performed to evaluate the deep venous system of both lower extremities from the level of the common femoral vein through the popliteal and proximal calf veins.  Spectral Doppler was utilized to evaluate flow at rest and with distal augmentation maneuvers.  Comparison:  None.  Findings:  Normal compressibility of bilateral common femoral, superficial femoral, and popliteal veins is demonstrated, as well as the visualized proximal calf veins.  No filling defects to suggest DVT on grayscale or color Doppler imaging.  Doppler waveforms show normal direction of venous flow, normal respiratory phasicity and response to augmentation.  IMPRESSION: No evidence of deep vein thrombosis, within either lower extremity.  Original Report Authenticated By: Waynard Reeds, M.D.   Dg Chest Port 1 View  05/07/2012  *RADIOLOGY REPORT*  Clinical Data: Fever.  Infiltrate.  PORTABLE CHEST - 1 VIEW  Comparison: Multiple priors most recently 05/17/2012 at 1535 hours. Chest CT 01/13/2007.  Findings: Left costophrenic angle excluded from view.  Left basilar subsegmental atelectasis.  Prominent right pericardial fat pad. Cardiopericardial silhouette is within normal limits.  No airspace disease.  No effusion.  Calcified granuloma in the right upper lobe and punctate calcifications of the mediastinal lymph nodes again noted compatible with old granulomatous disease.  IMPRESSION: No acute cardiopulmonary disease.  Subsegmental left basilar atelectasis.  Original Report Authenticated By: Andreas Newport, M.D.   Dg Chest Port 1 View  05/07/2012  *RADIOLOGY REPORT*  Clinical Data: Weakness.  Fall.  Multiple sclerosis.  Smoker.  PORTABLE CHEST - 1 VIEW  Comparison: 06/14/2011 chest radiograph.  CT chest 01/13/2007  Findings: The normal heart size and pulmonary vascularity.  Calcified granulomas in the mid lungs.  Nodular hilar opacities consistent with prominent lymph nodes and granulomas as seen on previous chest CT.  Mild emphysematous changes in the upper lungs. No focal airspace consolidation.  No blunting of costophrenic angles.  No pneumothorax.  Old left rib fracture.  IMPRESSION: Calcified granulomas with calcified hilar lymph nodes. Emphysematous changes.  No evidence of active pulmonary disease.  Original Report Authenticated By: Marlon Pel, M.D.   Dg Fluoro Guide Lumbar Puncture  05/11/2012  *RADIOLOGY REPORT*  Clinical Data: Multiple Sclerosis, fever and headaches.  DIAGNOSTIC LUMBAR PUNCTURE UNDER FLUOROSCOPIC GUIDANCE  Fluoroscopy time:  1.8. minutes.  Technique:  Informed consent was obtained from the patient prior to the procedure, including potential complications of headache, allergy, and pain. Lovenox was withheld for 1.5 days.  With the patient prone oblique, the lower back was prepped with Betadine. 1% Lidocaine was used for local anesthesia.  Lumbar puncture was performed at the L4-L5 level using a 12.7 cm 20  gauge needle with return of blood tinged CSF with an opening pressure of 19.2. cm water.   Only three ml of CSF were obtained for laboratory studies, even after placing the  patient in reverse Trendelenburg position. The patient tolerated the procedure well and there were no apparent complications.  IMPRESSION: Slightly elevated opening pressure of 19.2 cm  water.  Approximately 3 ml of blood-tinged CSF was obtained for pathological analysis.  Original Report Authenticated By: Brandon Melnick, M.D.   Results for JOHNLUKE, HAUGEN (MRN [id number]) as of 05/17/2012 07:43  Ref. Range 05/11/2012 13:35  RBC Count, CSF Latest Range: 0 /cu mm 5011 (H)  WBC, CSF Latest Range: 0-5 /cu mm 4  Segmented Neutrophils-CSF Latest Range: 0-6 % TOO FEW TO COUNT, SMEAR AVAILABLE FOR REVIEW  Appearance, CSF Latest Range: CLEAR  TURBID (A)  Color, CSF Latest Range:  COLORLESS  BLOODY (A)  Supernatant No range found NOT INDICATED  Tube # No range found 1  Blood cultures from 8/4: no growth final  Urine cultures: no growth final  HIV Ab: nonreactive  HIV viral load: neg  RPR: nonreactive  Blood cx 8/7: no growth final  LP from 8/7, CSF culture: Not done due to insufficient quantity  LFTs: AST 56 (8/13)and ALT 203 and 229 (8/9 and 8/10)  CBC: WBC range between 12.6 -9.9( 8/4 -8/10) and neutrophil 86  AFB culture from lymph node biopsy: Pending  TSH: normal  ANA-negative  RF- pending  CMV, EVB,TB quantiferon -Pending  Blastomyces and histoplasma ag sent to [facility]  Cryptococcal antigen- pending  ACE-pending  CK- normal range  Hepatitis panel-pending  SPE- pending     Scheduled Meds:    . aspirin EC  81 mg Oral Daily  . carbamazepine  200 mg Oral TID  . Chlorhexidine Gluconate Cloth  6 each Topical Once  . cholecalciferol  2,000 Units Oral BH-q7a  . docusate sodium  100 mg Oral BID  . gabapentin  600 mg Oral TID  . glatiramer  20 mg Subcutaneous Daily  . insulin aspart  0-15 Units Subcutaneous TID WC  . insulin aspart  0-5 Units Subcutaneous QHS  . itraconazole  200 mg Oral TID WC  . itraconazole  200 mg Oral BID WC  . pantoprazole  40 mg Oral BID AC  . potassium chloride  40 mEq Oral Once  . predniSONE  40 mg Oral Q breakfast  . sodium chloride  3 mL Intravenous Q12H  . DISCONTD: ibuprofen  600 mg Oral TID   Continuous Infusions:    Brevin Mcfadden [phone] If 7PM-7AM, please contact night-coverage [email or web address] Password [facility] 05/19/2012, 4:31 PM

## 2012-05-19 NOTE — Progress Notes (Signed)
[facility]    Date of Admission:  05/07/2012     Total days of antibiotics :9  Day: Ampicillin on 05/11/12 and 05/12/12  Day :Ceftrixone on 05/11/12 and 05/12/12  Day : Zosyn 8/8-12/13  Day : vancomycin 8/8-13/13  Day 3: Itraconazole                 Active Problems:  Multiple sclerosis exacerbation  Fever  HTN (hypertension)  Tobacco abuse  Chronic pain  Polysubstance abuse  Lower extremity pain  . aspirin EC  81 mg Oral Daily  . carbamazepine  200 mg Oral TID  . Chlorhexidine Gluconate Cloth  6 each Topical Once  . cholecalciferol  2,000 Units Oral BH-q7a  . docusate sodium  100 mg Oral BID  . gabapentin  600 mg Oral TID  . glatiramer  20 mg Subcutaneous Daily  . ibuprofen  600 mg Oral TID  . insulin aspart  0-15 Units Subcutaneous TID WC  . insulin aspart  0-5 Units Subcutaneous QHS  . itraconazole  200 mg Oral TID WC  . itraconazole  200 mg Oral BID WC  . pantoprazole  40 mg Oral BID AC  . potassium chloride  40 mEq Oral Once  . predniSONE  40 mg Oral Q breakfast  . sodium chloride  3 mL Intravenous Q12H   Subjective: He has been afebrile, Tmax 98.3. But c/o subjective feeling of fever but no chills. Otherwise no new complains. Pt denies chest pain, cough, SOB, abdominal pain, diarrhea or dysuria  Objective: Temp:  [97.6 F (36.4 C)-98.3 F (36.8 C)] 98.3 F (36.8 C) (08/16 0605) Pulse Rate:  [72-83] 76  (08/16 0605) Resp:  [18-20] 18  (08/16 0605) BP: (123-153)/(70-84) 153/84 mmHg (08/16 0605) SpO2:  [94 %-96 %] 96 % (08/16 0605) General: resting in bed, tired, NAD  Skin: No rashes, ulcer noted  HEENT: PERRL, EOMI, no scleral icterus, No lymphadenopathy palpable  Cardiac: RRR, no rubs, murmurs or gallops  Pulm: clear to auscultation bilaterally, moving normal volumes of air  Abd: soft, nontender, nondistended, BS present  Ext: Upper extremities full range motion. Lower extremities 2/5 strength, sensation intact  Neuro: alert and  oriented X3, cranial nerves II-XII grossly intact   Lab Results Lab Results  Component Value Date   WBC 10.6* 05/18/2012   HGB 11.2* 05/18/2012   HCT 31.6* 05/18/2012   MCV 89.5 05/18/2012   PLT 293 05/18/2012    Lab Results  Component Value Date   CREATININE 0.74 05/18/2012   BUN 8 05/18/2012   NA 141 05/18/2012   K 3.0* 05/18/2012   CL 103 05/18/2012   CO2 27 05/18/2012    Lab Results  Component Value Date   ALT 129* 05/18/2012   AST 97* 05/18/2012   ALKPHOS 148* 05/18/2012   BILITOT 1.4* 05/18/2012    Microbiology: Recent Results (from the past 240 hour(s))  CULTURE, BLOOD (ROUTINE X 2)     Status: Normal   Collection Time   05/10/12  5:05 PM      Component Value Range Status Comment   Specimen Description BLOOD LEFT HAND   Final    Special Requests BOTTLES DRAWN AEROBIC AND ANAEROBIC 12CC   Final    Culture NO GROWTH 5 DAYS   Final    Report Status 05/15/2012 FINAL   Final   CULTURE, BLOOD (ROUTINE X 2)     Status: Normal   Collection Time   05/10/12  5:10 PM  Component Value Range Status Comment   Specimen Description BLOOD RIGHT ANTECUBITAL   Final    Special Requests BOTTLES DRAWN AEROBIC AND ANAEROBIC 12CC   Final    Culture NO GROWTH 5 DAYS   Final    Report Status 05/15/2012 FINAL   Final   TISSUE CULTURE     Status: Normal   Collection Time   05/15/12  7:30 AM      Component Value Range Status Comment   Specimen Description TISSUE LYMPH NODE   Final    Special Requests NO 2/MEDIASTINAL   Final    Gram Stain     Final    Value: FEW WBC PRESENT,BOTH PMN AND MONONUCLEAR     NO SQUAMOUS EPITHELIAL CELLS SEEN     NO ORGANISMS SEEN   Culture NO GROWTH 3 DAYS   Final    Report Status 05/19/2012 FINAL   Final   AFB CULTURE WITH SMEAR     Status: Normal (Preliminary result)   Collection Time   05/15/12  7:30 AM      Component Value Range Status Comment   Specimen Description TISSUE LYMPH NODE   Final    Special Requests NO 2/MEDIASTINAL   Final    ACID FAST SMEAR NO  ACID FAST BACILLI SEEN   Final    Culture     Final    Value: CULTURE WILL BE EXAMINED FOR 6 WEEKS BEFORE ISSUING A FINAL REPORT   Report Status PENDING   Incomplete   FUNGUS CULTURE W SMEAR     Status: Normal (Preliminary result)   Collection Time   05/15/12  7:30 AM      Component Value Range Status Comment   Specimen Description TISSUE LYMPH NODE   Final    Special Requests NO 2/MEDIASTINAL   Final    Fungal Smear NO YEAST OR FUNGAL ELEMENTS SEEN   Final    Culture CULTURE IN PROGRESS FOR FOUR WEEKS   Final    Report Status PENDING   Incomplete    Imaging/Diagnostic Tests:  Dg Chest Port 1 View 8/4  1. No acute cardiopulmonary disease.  2.Subsegmental left basilar atelectasis   Mr Thoracic Spine W Wo Contrast 8/5  1. Plaques in the thoracic spinal cord at T2, T10, and T11, most consistent with multiple sclerosis.  2. No evidence of transverse myelitis or spinal abscess.   Mr Lumbar Spine W Wo Contrast 8/5  1. Multilevel degenerative disc disease without focal neural impingement. No change since the prior exam.  2.No significant enhancement after contrast administration.   US Venous Img Lower Bilateral 8/5  No evidence of deep vein thrombosis, within either lower extremity.   MRI head w/wout contrast  MRI cervical spine w/wout contrast 8/6   1. Stable MRI appearance of chronic demyelinating disease of the brain. No enhancement or evidence of acute demyelination.  2. Cervical spine findings are below.  3. No new intracranial abnormality.  Diagnostic LP 8/7   1.Slightly elevated opening pressure of 19.2 cm water.  2.Approximately 3 ml of blood-tinged CSF was obtained for pathological analysis.   CT ABDOMEN AND PELVIS 8/9  1. The suspected diffuse mild hepatic steatosis.  2. Several small hypodense lesions in the left kidney are likely cyst but technically too small to characterize.  3. Degenerative grade 1 anterolisthesis of L5 on S1  CT CHEST 8/9  1. Pathologic  mediastinal and hilar adenopathy. No airway thickening or pleural nodularity to suggest pulmonary parenchymal findings of sarcoidosis.  The appearance could reflect lymphoma. Tissue diagnosis is likely warranted.  2. Small hiatal hernia.  3. Old granulomatous disease   Bilateral hip with pelvis 8/12:  No acute finding. Mild bilateral hip degenerative change.  Mediastinoscopy 8/1  Mediastinal adenopathy   PORTABLE CHEST 8/12  Low lung volumes and mild bibasilar atelectasis. Postoperative changes in the right paratracheal region. Negative for  pneumothorax or other acute finding.   EKG 8/12:  Sinus tachycardia and Low voltage QRS   PORTABLE CHEST 8/14  Suspected mild interstitial edema   Labs summary:  CBGs: 112 (8/13)  Blood cultures from 8/4: no growth final  Urine cultures: no growth final  HIV Ab: nonreactive  HIV viral load: neg  RPR: nonreactive  Blood cx 8/7: no growth final  LP from 8/7, CSF culture: Not done due to insufficient quantity  LFTs: AST 56 (8/13)and ALT 203 and 229 (8/9 and 8/10)  CBC: WBC range between 12.6 -9.9( 8/4 -8/10) and neutrophil 86  lymph node biopsy: GMS, PAS, and AFB stains are negative for yeast fungi and mycobacterium  TSH: normal  ANA-negative  RF- pending  TB quantiferon -Pending  Blastomyces and histoplasma ag: Urine sent to [facility] lab  Cryptococcal antigen- negative  ACE- High 100(Nomal 8-52)  CK- normal range  Hepatitis panel-pending  SPEP- pending  CRP- High  EBV VCA IgG and EBV VCA IgM- High abnormal  Assessment and plan:   1. Fever of unknown origin: Chad Vasquez is a 51 y.o. male PMH of Multiple sclerosis and polysubstance abuse p/w fever and bilateral lower extremities weakness on 05/07/2012. Fever workup done without any abnormality. MRI thoracic and lumber spine, MRI head, LP and CSF analysis, CT abdomen , doppler, Imaging unremarkable except CT chest positive for Pathologic mediastinal and hilar adenopathy. S/p  Mediastinoscopy negative for cancer and GMS, PAS, and AFB stains are negative for yeast fungi and mycobacterium. His fever is waxing and waning but has been afebrile in last 24 hrs. Empirically pt had 8 days of broad spectrum antibiotics. Maximum leukocytosis 11.5 on 05/12/12. But we are concern for micobacterial or fungal infections or autoimmune cause. Empirically started Itraconazole yesterday. We also considering Sarcoidosis as a another cause of chronic granulomatous disease. ACE abnomaly high number, Calcium level within normal limit though. Pt received 5 days of Solumedrol for MS exacerbation in this admission.  Abnormal EBV antibody possibly an old infection.   1.Completed 3 days course of oral Itraconazole  2. Added oral prednisone today 40mg    2. Abnormal LFTs: Hepatitis panel negative    Akter, Nasrin 05/19/2012, 9:48 AM     INFECTIOUS DISEASE ATTENDING ADDENDUM:     [facility]   Date: 05/19/2012  Patient name: VANN OKERLUND  Medical record number: [id number]  Date of birth: 12/06/1960    This patient has been seen and discussed with the house staff. Please see their note for complete details. I concur with their findings with the following additions/corrections:  Given the presence of  granulomatous lung nodules with a high angiotensin converting enzyme we feel the unifying diagnosis is most likely sarcoidosis  I will stop the patient's itraconazole  Observe the patient on corticosteroid therapy alone.  I would make sure that Dr. love the patient's neurologist is aware of this patient's new diagnosis of sarcoidosis  Please call us back if the patient has further fevers. I will arrange for hospital followup in one month to review the patient's laboratory findings   I will sign  off for now.  Acey Lav 05/19/2012, 6:08 PM

## 2012-05-19 NOTE — Progress Notes (Signed)
Physical Therapy Treatment Patient Details Name: Chad Vasquez MRN: [id number] DOB: 03/02/1961 Today's Date: 05/19/2012 Time: 9629-5284 PT Time Calculation (min): 30 min  PT Assessment / Plan / Recommendation Comments on Treatment Session  Pt with MS exacerbation and newly diagnosed sarcoidosis who is demonstrating increased bil LE strength today. RLE: hip flexion 2+/5, knee extension 3/5, hip abdct/ADD 2+/5. LLE: hip flexion 2/5, knee extension 2+/5, hip abdct/ADD 2/5. Pt with very positive demeanor today and excited about activity progression and ability to stand today. Pt encouraged to continue bil LE HEP throughout the day and to be OOB daily with nursing over the weekend.     Follow Up Recommendations       Barriers to Discharge        Equipment Recommendations       Recommendations for Other Services    Frequency     Plan Discharge plan remains appropriate;Frequency remains appropriate    Precautions / Restrictions Precautions Precautions: Fall   Pertinent Vitals/Pain No pain    Mobility  Bed Mobility Rolling Left: 5: Supervision;With rail Left Sidelying to Sit: 5: Supervision;HOB flat;With rails Sitting - Scoot to Edge of Bed: 5: Supervision Details for Bed Mobility Assistance: pt provided cueing to sequence rolling to side then to sit with pt able to use bil LE strength and assist with bil UE to position legs to transfer from side to sit with use of rail and cueing. cueing for reciprocal scooting to EOB Transfers Transfers: Sit to Stand;Squat Pivot Transfers;Stand to Sit Sit to Stand: 4: Min assist;From chair/3-in-1;With armrests Stand to Sit: 4: Min assist;To chair/3-in-1;With armrests Squat Pivot Transfers: 4: Min guard;With upper extremity assistance;From elevated surface Details for Transfer Assistance: Pt on elevated bed with chair padded for elevated height initiated transfer with sliding board and then positioned legs himself and ended up semisquat positioning  on bil LE to transfer completely to chair. Once seated in chair noted need for pericare and pt stood x 2 from chair with cueing for hand placement and sequence and maintained 1 min each trial with use of RW.  Ambulation/Gait Ambulation/Gait Assistance: Not tested (comment)    Exercises General Exercises - Lower Extremity Long Arc Quad: AROM;AAROM;Right;Left;15 reps;Seated (AAROM on LLE) Hip ABduction/ADduction: AAROM;Both;10 reps;Seated Hip Flexion/Marching: AROM;AAROM;Right;Left;10 reps;Seated (AAROM on LLE)   PT Diagnosis:    PT Problem List:   PT Treatment Interventions:     PT Goals Acute Rehab PT Goals Pt will go Supine/Side to Sit: with modified independence PT Goal: Supine/Side to Sit - Progress: Updated due to goal met PT Goal: Sit to Supine/Side - Progress: Progressing toward goal Pt will go Sit to Stand: with supervision PT Goal: Sit to Stand - Progress: Updated due to goal met Pt will go Stand to Sit: with supervision PT Goal: Stand to Sit - Progress: Updated due to goals met PT Transfer Goal: Bed to Chair/Chair to Bed - Progress: Progressing toward goal Pt will Ambulate: 1 - 15 feet;with min assist;with rolling walker PT Goal: Ambulate - Progress: Goal set today  Visit Information  Last PT Received On: 05/19/12 Assistance Needed: +1    Subjective Data  Subjective: I feel like I'm getting some strength back   Cognition  Overall Cognitive Status: Appears within functional limits for tasks assessed/performed Arousal/Alertness: Awake/alert Orientation Level: Appears intact for tasks assessed Behavior During Session: [facility] for tasks performed    Balance  Static Sitting Balance Static Sitting - Balance Support: No upper extremity supported;Feet supported Static Sitting - Level  of Assistance: 6: Modified independent (Device/Increase time) Static Sitting - Comment/# of Minutes: 3  End of Session PT - End of Session Equipment Utilized During Treatment: Gait  belt Activity Tolerance: Patient tolerated treatment well Patient left: in chair;with call bell/phone within reach Nurse Communication: Mobility status   GP     Delorse Lek 05/19/2012, 3:26 PM Delaney Meigs, PT [phone]

## 2012-05-19 NOTE — Progress Notes (Signed)
Pt O2 sats at rest on room air are 92%. Unable to assess during ambulation. Will leave O2 cannula off.

## 2012-05-20 LAB — BASIC METABOLIC PANEL
BUN: 16 mg/dL (ref 6–23)
CO2: 27 mEq/L (ref 19–32)
Calcium: 9.1 mg/dL (ref 8.4–10.5)
Chloride: 107 mEq/L (ref 96–112)
Creatinine, Ser: 0.86 mg/dL (ref 0.50–1.35)
GFR calc Af Amer: >90 mL/min (ref >90)
GFR calc non Af Amer: >90 mL/min (ref >90)
Glucose, Bld: 112 mg/dL — ABNORMAL HIGH (ref 70–99)
Potassium: 3.5 mEq/L (ref 3.5–5.1)
Sodium: 143 mEq/L (ref 135–145)

## 2012-05-20 LAB — GLUCOSE, CAPILLARY

## 2012-05-20 NOTE — Progress Notes (Signed)
CSW was consulted to complete discharge of patient. Pt to transfer to [facility] today via [facility]. [facility] [phone]) is aware of d/c. D/C packet complete with chart copy, signed FL2, and signed hard Rx.  CSW contacted patient's significant other and left a voice mail about patient's transfer. CSW signing off as no other CSW needs identified at this time.  Lia Foyer, LCSWA [facility] Clinical Social Worker Contact #: [phone] (weekend)

## 2012-05-20 NOTE — Progress Notes (Addendum)
Clinical Social Work Department CLINICAL SOCIAL WORK PLACEMENT NOTE 05/20/2012  Patient:  Chad Vasquez, Chad Vasquez  Account Number:  [id number] Admit date:  05/07/2012  Clinical Social Worker: Lia Foyer, Theresia Majors,  Date/time:  05/20/2012 10:50 AM  Clinical Social Work is seeking post-discharge placement for this patient at the following level of care:   SKILLED NURSING   (*CSW will update this form in Epic as items are completed)   05/17/2012  Patient/family provided with [facility] Department of Clinical Social Work's list of facilities offering this level of care within the geographic area requested by the patient (or if unable, by the patient's family).   05/17/2012 Patient/family informed of their freedom to choose among providers that offer the needed level of care, that participate in Medicare, Medicaid or managed care program needed by the patient, have an available bed and are willing to accept the patient.   05/17/2012 Patient/family informed of [facility]' ownership interest in [facility], as well as of the fact that they are under no obligation to receive care at this facility.  PASARR submitted to EDS on 05/17/2012 PASARR number received from EDS on 05/17/2012  FL2 transmitted to all facilities in geographic area requested by pt/family on 05/17/2012  FL2 transmitted to all facilities within larger geographic area on   Patient informed that his/her managed care company has contracts with or will negotiate with  certain facilities, including the following:     Patient/family informed of bed offers received: 05/18/2012  Patient chooses bed at [facility] [place] Physician recommends and patient chooses bed at    Patient to be transferred to [facility] on  05/20/2012 Patient to be transferred to facility by [facility]  The following physician request were entered in Epic:  Lia Foyer, LCSWA [facility] Clinical  Social Worker Contact #: [phone] (weekend)

## 2012-05-20 NOTE — Discharge Summary (Signed)
Physician Discharge Summary  LANCE HUARACHA ZOX:[id number] DOB: 07-Jul-1961 DOA: 05/07/2012  PCP: Colette Ribas, MD  Admit date: 05/07/2012 Discharge date: 05/20/2012  Recommendations for Outpatient Follow-up:  1. The patient needs to followup with his new rheumatologist Dr. Alben Deeds 2. Needs close followup of LFTs  Discharge Diagnoses:    Newly diagnosed systemic Sarcoidosis - prednisone therapy initiated during this admission  Multiple sclerosis with presumed exacerbation - not confirmed by MRI studies  Fever - extensive infectious workup negative - presumed to be due to sarcoidosis  HTN (hypertension)  Tobacco abuse  Chronic pain  Polysubstance abuse  Paraparesis - related to sequelae of MS and deconditioning Chronic hepatitis - NAFLD. versus sarcoidosis -    Discharge Condition: Patient is transferred to a skilled nursing home for short-term rehabilitation. He needs full assistance with transfers  Diet recommendation: Heart healthy  Filed Weights   05/07/12 2044 05/12/12 0526 05/13/12 0800  Weight: 116.075 kg (255 lb 14.4 oz) 113.263 kg (249 lb 11.2 oz) 116 kg (255 lb 11.7 oz)    History of present illness:  51 yo man with relapsing-remitting Multiple Sclerosis diagnosed in 2010, on long-term steroids and Copaxone and followed by Dr. Sandria Manly with neurology who came into [facility] ED for the second time in the past 24 hours c/o progressive weakness in his lower extremities and fevers. His wife is at the bedside and reports that they cannot take care of him at home in this condition and that he cannot even walk to the bathroom with being carried and EMS had to be called to bring him to hospital. He reports no chest pain, congestion, abdominal pain, dysuria or other signs of infection except for fever and chills. No sick contacts. No Ha or neck stiffness. No recent weight loss. He complains of chronic back pain and pain in his legs, wife concerned that his legs "feel cold".       Hospital Course:  1. presumedMultiple sclerosis exacerbation (05/07/2012) - not confirmed by MRI findings of the brain and cervical spine -completed a five-day course of Solu-Medrol before the final results of MRIs were available  -CIR consult obtained but the patient was not found eligible for inpatient rehabilitation  - Patient is back on Copaxone - his chronic maintenance medication   Fever (05/07/2012) - Empiric treatment with vancomycin zosyn stoped, completed a 8 day course. A CT scan of the chest was positive for lymphadenopathy, HIV negative. The patient was transferred to [facility] and he underwent mediastinoscopy with biopsy of a mediastinal lymph node on 8/12. The results were negative for lymphoma. Negative bacterial cultures till date. Fungal cultures sent out. Blood cultures x2 continue to be negative CSF cultures are negative urine cultures are negative. ANA was obtained on August 13 and it was negative. ANCA antibodies were negative for vasculitis. The patient was not on any new drugs recently that will cause fever.  - appreciate ID input. The patient was started empirically on itraconazole on August 14, while we were waiting for final results of the fungal cultures.  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  Discuss with Dr. Dierdre Forth from rheumatology and the plan is to start prednisone 40 mg daily on 05/18/12 and followup closely  After initiation of prednisone the fever resolved and the patient has been feeling better for the last 48 hours  Hypokalemia - replete and follow BMP .  Chronic pain weakness and deconditioning  -continue narcotics and ibuprofen prn . The patient was seen by physical therapy and he will be transferring to a nursing home for short-term rehabilitation  Abnormal LFTs  - suspect due to fatty  liver ds and possible due to sarcoidosis. Hepatitis panel negative for HBV, HCV, HAV  Polysubstance abuse (05/07/2012)  -counseled  Severe peripheral neuropathy coupled with chronic deficits from multiple sclerosis  -patient is getting referred for rehabilitation   Procedures:  Thoracic lymph node biopsy - mediastinoscopy Consult[facility]  Infectious diseases Antibiotics:  Vancomycin and zosyn completed a 8 days course    Discharge Exam: Filed Vitals:   05/20/12 0456  BP: 141/78  Pulse: 85  Temp: 99 F (37.2 C)  Resp: 20   Filed Vitals:   05/19/12 1426 05/19/12 1516 05/19/12 2134 05/20/12 0456  BP: 127/80  139/82 141/78  Pulse: 76 81 64 85  Temp: 98.4 F (36.9 C)  98.2 F (36.8 C) 99 F (37.2 C)  TempSrc: Oral  Oral Oral  Resp: 18  20 20   Height:      Weight:      SpO2: 93% 93% 94% 91%    General: axox3 Cardiovascular: RRR Respiratory: CTAB  Discharge Instructions  Discharge Orders    Future Orders Please Complete By Expires   Diet - low sodium heart healthy      Increase activity slowly        Medication List  As of 05/20/2012  8:46 AM   STOP taking these medications         losartan 100 MG tablet      ondansetron 4 MG tablet         TAKE these medications         acetaminophen 325 MG tablet   Commonly known as: TYLENOL   Take 2 tablets (650 mg total) by mouth every 6 (six) hours as needed for pain or fever (or Fever >/= 101).      carbamazepine 200 MG tablet   Commonly known as: TEGRETOL   Take 200 mg by mouth 3 (three) times daily.      DSS 100 MG Caps   Take 100 mg by mouth 2 (two) times daily.      gabapentin 300 MG capsule   Commonly known as: NEURONTIN   Take 300 mg by mouth 3 (three) times daily.      glatiramer 20 MG/ML injection   Commonly known as: COPAXONE   Inject 20 mg into the skin every morning.      HYDROcodone-acetaminophen 7.5-750 MG per tablet   Commonly known as: VICODIN ES   Take 1 tablet by mouth every 6 (six) hours as  needed for pain. For pain      insulin aspart 100 UNIT/ML injection   Commonly known as: novoLOG   Inject 0-15 Units into the skin 3 (three) times daily with meals.      itraconazole 100 MG capsule   Commonly known as: SPORANOX   Take 2 capsules (200 mg total) by mouth 2 (two) times daily with a meal.      pantoprazole 40 MG tablet   Commonly known as: PROTONIX   Take 1 tablet (40 mg total) by mouth 2 (two) times daily before a meal.      polyethylene glycol packet   Commonly known as: MIRALAX / GLYCOLAX   Take 17 g by mouth once.      predniSONE  20 MG tablet   Commonly known as: DELTASONE   Take 2 tablets (40 mg total) by mouth daily with breakfast.      Vitamin D 2000 UNITS tablet   Take 2,000 Units by mouth every morning.           Follow-up Information    Follow up with Colette Ribas, MD.   Contact information:   [address] [place] [address] [place] [address] [phone]       Schedule an appointment as soon as possible for a visit with Donnetta Hail, MD.   Contact information:   [address], [address]  [place] [address] [phone]           The results of significant diagnostics from this hospitalization (including imaging, microbiology, ancillary and laboratory) are listed below for reference.    Significant Diagnostic Studies: Dg Hip Bilateral W/pelvis  05/15/2012  *RADIOLOGY REPORT*  Clinical Data: Bilateral leg pain.  The patient is unable to move his legs.  BILATERAL HIP WITH PELVIS - 4+ VIEW  Comparison: CT abdomen and pelvis 05/12/2012.  Findings: Both hips are located.  There is no fracture.  Mild degenerative change about the hips is identified.  The symphysis pubis and sacroiliac joints are unremarkable.  Soft tissue structures appear normal.  IMPRESSION: No acute finding.  Mild bilateral hip degenerative change.  Original Report Authenticated By: Bernadene Bell. Maricela Curet, M.D.   Ct Chest W  Contrast  05/12/2012  *RADIOLOGY REPORT*  Clinical Data:  Fever unknown origin.  Chronic back pain.  Multiple sclerosis.  Weakness.  CT CHEST, ABDOMEN AND PELVIS WITH CONTRAST  Technique:  Multidetector CT imaging of the chest, abdomen and pelvis was performed following the standard protocol during bolus administration of intravenous contrast.  Contrast: 1 OMNIPAQUE IOHEXOL 300 MG/ML  SOLN (100 ml)  Comparison:  Multiple exams, including 05/07/2012  CT CHEST  Findings:  Right paratracheal node measures 1.3 cm in short axis, image 17 of series 3.  An AP window lymph node measures 1.6 cm in short axis, image 24 of series 3.  Right hilar adenopathy noted with one right hilar node measuring 1.5 cm in short axis, image 32 of series 3.  Left hilar adenopathy noted with several small calcifications, and also scattered infrahilar nodes.  Conglomerate subcarinal adenopathy measures 2.2 cm in short axis, image 30 of series 3.  Scattered small paraesophageal lymph nodes are present in the lower thorax.  Heart size is within normal limits.  A small hiatal hernia is present.  A 4 mm densely calcified right upper lobe pulmonary nodule on image 14 of series 4 is compatible with calcified granuloma.  Centrilobular emphysema noted.  There is mild atelectasis or scarring in the posterior basal segments of both lower lobes. Several additional small calcified right upper lobe granulomas are present.  There are several small calcified granulomas in the left lung.  IMPRESSION:  1.  Pathologic mediastinal and hilar adenopathy.  No airway thickening or pleural nodularity to suggest pulmonary parenchymal findings of sarcoidosis.  The appearance could reflect lymphoma. Tissue diagnosis is likely warranted. 2.  Small hiatal hernia. 3.  Old granulomatous disease.  CT ABDOMEN AND PELVIS  Findings:  Diffuse mild low density in the liver suggests diffuse mild hepatic steatosis.  The spleen, adrenal glands, and pancreas appear unremarkable.  The  gallbladder and biliary system appear unremarkable.  Small peripancreatic lymph nodes do not appear pathologically enlarged by size criteria.  The right  kidney appears normal.  Several small hypodense lesions in the left kidney are technically too small to characterize although statistically likely represent cysts.  The renal calculi referenced on the prior ultrasound examination from 07/06/2001 are not observed on today's exam. No pathologic pelvic adenopathy is identified.  The appendix is within normal limits.  No significant colonic diverticular disease observed.  No dilated bowel noted.  Urinary bladder appears unremarkable.  Degenerative grade 1 anterolisthesis of L5 on S1 is noted.  IMPRESSION:  1.  The suspected diffuse mild hepatic steatosis. 2.  Several small hypodense lesions in the left kidney are likely cyst but technically too small to characterize. 3.  Degenerative grade 1 anterolisthesis of L5 on S1.  Original Report Authenticated By: Dellia Cloud, M.D.   Mr Laqueta Jean Wo Contrast  05/09/2012  *RADIOLOGY REPORT*  Clinical Data:  51 year old male with lower extremity weakness. History of multiple sclerosis.  MRI HEAD WITHOUT AND WITH CONTRAST MRI CERVICAL SPINE WITHOUT AND WITH CONTRAST  Technique:  Multiplanar, multiecho pulse sequences of the brain and surrounding structures, and cervical spine, to include the craniocervical junction and cervicothoracic junction, were obtained without and with intravenous contrast.  Contrast: 20mL MULTIHANCE GADOBENATE DIMEGLUMINE 529 MG/ML IV SOLN  Comparison:  Thoracic spine study performed the previous day and reported separately.  Brain and cervical MRI 09/15/2010 and earlier.  MRI HEAD  Findings:  Normal cerebral volume. No restricted diffusion to suggest acute infarction.  No midline shift, mass effect, evidence of mass lesion, ventriculomegaly, extra-axial collection or acute intracranial hemorrhage.  Cervicomedullary junction and pituitary are within  normal limits.  Major intracranial vascular flow voids are stable, dominant left vertebral artery.  Normal bone marrow signal.  No abnormal enhancement identified.  Chronically advanced T2 and FLAIR hyperintense foci throughout the brain are stable since 2011. Cerebral hemisphere lesions are oriented perpendicular to the lateral ventricles as before, typical in appearance for multiple sclerosis.  There is involvement of the brain stem as before. Cervical spine findings are below.  Orbit soft tissues are within normal limits.  Stable minor paranasal sinus mucosal thickening.  Mastoids remain clear. Negative scalp soft tissues.  IMPRESSION: 1.  Stable MRI appearance of chronic demyelinating disease of the brain.  No enhancement or evidence of acute demyelination. 2.  Cervical spine findings are below. 3.  No new intracranial abnormality.  MRI CERVICAL SPINE  Findings: Stable cervical vertebral height and alignment with preserved lordosis. No marrow edema or evidence of acute osseous abnormality.  Visualized paraspinal soft tissues are within normal limits.  Chronic T2 and STIR hyperintense right lateral spinal cord plaque at C3 is re-identified and not significantly changed.  Elsewhere the cervical spinal cord signal is within normal limits.  Upper thoracic spinal cord plaque is partially visible on the left at T2 and is also chronic.  No abnormal spinal cord or intradural enhancement to suggest acute demyelination.  Stable chronic predominately mild cervical spine degenerative changes.  There is minimal to mild spinal stenosis at C6-C7 with moderate to severe bilateral foraminal stenosis again noted, related to disc bulge and ligament flavum hypertrophy.  There is a mild chronic disc bulge at C3-C4 with chronic uncovertebral hypertrophy and mild to moderate to bilateral foraminal stenosis.  IMPRESSION: 1.  Stable chronic demyelinating lesions in the spinal cord at C3 and T2.  No new spinal cord signal abnormality.   No enhancement to suggest acute demyelination. 2.  Stable mild for age cervical degenerative changes.  Original Report Authenticated By:  H.LEE HALL III, M.D.   Mr Cervical Spine W Wo Contrast  05/09/2012  *RADIOLOGY REPORT*  Clinical Data:  51 year old male with lower extremity weakness. History of multiple sclerosis.  MRI HEAD WITHOUT AND WITH CONTRAST MRI CERVICAL SPINE WITHOUT AND WITH CONTRAST  Technique:  Multiplanar, multiecho pulse sequences of the brain and surrounding structures, and cervical spine, to include the craniocervical junction and cervicothoracic junction, were obtained without and with intravenous contrast.  Contrast: 20mL MULTIHANCE GADOBENATE DIMEGLUMINE 529 MG/ML IV SOLN  Comparison:  Thoracic spine study performed the previous day and reported separately.  Brain and cervical MRI 09/15/2010 and earlier.  MRI HEAD  Findings:  Normal cerebral volume. No restricted diffusion to suggest acute infarction.  No midline shift, mass effect, evidence of mass lesion, ventriculomegaly, extra-axial collection or acute intracranial hemorrhage.  Cervicomedullary junction and pituitary are within normal limits.  Major intracranial vascular flow voids are stable, dominant left vertebral artery.  Normal bone marrow signal.  No abnormal enhancement identified.  Chronically advanced T2 and FLAIR hyperintense foci throughout the brain are stable since 2011. Cerebral hemisphere lesions are oriented perpendicular to the lateral ventricles as before, typical in appearance for multiple sclerosis.  There is involvement of the brain stem as before. Cervical spine findings are below.  Orbit soft tissues are within normal limits.  Stable minor paranasal sinus mucosal thickening.  Mastoids remain clear. Negative scalp soft tissues.  IMPRESSION: 1.  Stable MRI appearance of chronic demyelinating disease of the brain.  No enhancement or evidence of acute demyelination. 2.  Cervical spine findings are below. 3.  No new  intracranial abnormality.  MRI CERVICAL SPINE  Findings: Stable cervical vertebral height and alignment with preserved lordosis. No marrow edema or evidence of acute osseous abnormality.  Visualized paraspinal soft tissues are within normal limits.  Chronic T2 and STIR hyperintense right lateral spinal cord plaque at C3 is re-identified and not significantly changed.  Elsewhere the cervical spinal cord signal is within normal limits.  Upper thoracic spinal cord plaque is partially visible on the left at T2 and is also chronic.  No abnormal spinal cord or intradural enhancement to suggest acute demyelination.  Stable chronic predominately mild cervical spine degenerative changes.  There is minimal to mild spinal stenosis at C6-C7 with moderate to severe bilateral foraminal stenosis again noted, related to disc bulge and ligament flavum hypertrophy.  There is a mild chronic disc bulge at C3-C4 with chronic uncovertebral hypertrophy and mild to moderate to bilateral foraminal stenosis.  IMPRESSION: 1.  Stable chronic demyelinating lesions in the spinal cord at C3 and T2.  No new spinal cord signal abnormality.  No enhancement to suggest acute demyelination. 2.  Stable mild for age cervical degenerative changes.  Original Report Authenticated By: Harley Hallmark, M.D.   Mr Thoracic Spine W Wo Contrast  05/08/2012  *RADIOLOGY REPORT*  Clinical Data: Lower extremity weakness.  Fever.  Multiple sclerosis.  MRI THORACIC SPINE WITHOUT AND WITH CONTRAST  Technique:  Multiplanar and multiecho pulse sequences of the thoracic spine were obtained without and with intravenous contrast.  Contrast: 20mL MULTIHANCE GADOBENATE DIMEGLUMINE 529 MG/ML IV SOLN  Comparison: Cervical MRI dated 09/15/2010  Findings: The patient has a subtle plaque in the left posterior lateral aspect of the thoracic spinal cord at T2, unchanged since the cervical MRI dated 09/15/2010.  There are also small focal plaques in the posterior columns at T10 and  centrally in the spinal cord at T10-11.  The spinal cord is not  enlarged.  There is no pathologic enhancement of the spinal cord or plaques after contrast administration.  The osseous structures of the thoracic spine are normal.  Discs are normal.  No foraminal or spinal stenosis.  The paraspinal soft tissues are normal.  IMPRESSION:  1.  Plaques in the thoracic spinal cord at T2, T10, and T11, most consistent with multiple sclerosis. 2.  No evidence of transverse myelitis or spinal abscess.  Original Report Authenticated By: Gwynn Burly, M.D.   Mr Lumbar Spine W Wo Contrast  05/08/2012  *RADIOLOGY REPORT*  Clinical Data: Lower extremity weakness.  Fever.  Multiple sclerosis.  MRI LUMBAR SPINE WITHOUT AND WITH CONTRAST  Technique:  Multiplanar and multiecho pulse sequences of the lumbar spine were obtained without and with intravenous contrast.  Contrast: 20mL MULTIHANCE GADOBENATE DIMEGLUMINE 529 MG/ML IV SOLN  Comparison: MRI dated 02/25/2012  Findings: Scan extends from T11-12 through S2.  Tip of the conus is at L1-2 and appears normal.  T11-12:  Normal.  T12-L1:  Small disc bulge central and slightly to the right without neural impingement without change.  L1-2:  Small broad-based disc bulge with no neural impingement, unchanged.  L2-3:  Far lateral disc bulges to the right and left without neural impingement and without change.  L3-4:  Far lateral annular tears of the right and left without neural impingement, unchanged.  Moderate bilateral facet arthritis. Slight narrowing of the spinal canal, unchanged.  L4-5:  Moderate bilateral facet hypertrophy.  Small annular tear to the left.  No neural impingement.  L5 S1:  5 mm spondylolisthesis with slight bulging of the uncovered disc with slight narrowing of the neural foramina, right greater than left, without focal neural impingement, unchanged.  IMPRESSION:   Multilevel degenerative disc disease without focal neural impingement.  No change since the prior  exam.  No significant enhancement after contrast administration.  Original Report Authenticated By: Gwynn Burly, M.D.   Ct Abdomen Pelvis W Contrast  05/12/2012  *RADIOLOGY REPORT*  Clinical Data:  Fever unknown origin.  Chronic back pain.  Multiple sclerosis.  Weakness.  CT CHEST, ABDOMEN AND PELVIS WITH CONTRAST  Technique:  Multidetector CT imaging of the chest, abdomen and pelvis was performed following the standard protocol during bolus administration of intravenous contrast.  Contrast: 1 OMNIPAQUE IOHEXOL 300 MG/ML  SOLN (100 ml)  Comparison:  Multiple exams, including 05/07/2012  CT CHEST  Findings:  Right paratracheal node measures 1.3 cm in short axis, image 17 of series 3.  An AP window lymph node measures 1.6 cm in short axis, image 24 of series 3.  Right hilar adenopathy noted with one right hilar node measuring 1.5 cm in short axis, image 32 of series 3.  Left hilar adenopathy noted with several small calcifications, and also scattered infrahilar nodes.  Conglomerate subcarinal adenopathy measures 2.2 cm in short axis, image 30 of series 3.  Scattered small paraesophageal lymph nodes are present in the lower thorax.  Heart size is within normal limits.  A small hiatal hernia is present.  A 4 mm densely calcified right upper lobe pulmonary nodule on image 14 of series 4 is compatible with calcified granuloma.  Centrilobular emphysema noted.  There is mild atelectasis or scarring in the posterior basal segments of both lower lobes. Several additional small calcified right upper lobe granulomas are present.  There are several small calcified granulomas in the left lung.  IMPRESSION:  1.  Pathologic mediastinal and hilar adenopathy.  No airway thickening or  pleural nodularity to suggest pulmonary parenchymal findings of sarcoidosis.  The appearance could reflect lymphoma. Tissue diagnosis is likely warranted. 2.  Small hiatal hernia. 3.  Old granulomatous disease.  CT ABDOMEN AND PELVIS  Findings:   Diffuse mild low density in the liver suggests diffuse mild hepatic steatosis.  The spleen, adrenal glands, and pancreas appear unremarkable.  The gallbladder and biliary system appear unremarkable.  Small peripancreatic lymph nodes do not appear pathologically enlarged by size criteria.  The right kidney appears normal.  Several small hypodense lesions in the left kidney are technically too small to characterize although statistically likely represent cysts.  The renal calculi referenced on the prior ultrasound examination from 07/06/2001 are not observed on today's exam. No pathologic pelvic adenopathy is identified.  The appendix is within normal limits.  No significant colonic diverticular disease observed.  No dilated bowel noted.  Urinary bladder appears unremarkable.  Degenerative grade 1 anterolisthesis of L5 on S1 is noted.  IMPRESSION:  1.  The suspected diffuse mild hepatic steatosis. 2.  Several small hypodense lesions in the left kidney are likely cyst but technically too small to characterize. 3.  Degenerative grade 1 anterolisthesis of L5 on S1.  Original Report Authenticated By: Dellia Cloud, M.D.   US Venous Img Lower Bilateral  05/08/2012  *RADIOLOGY REPORT*  Clinical Data: Bilateral leg weakness and hip pain, history of smoking, evaluate for DVT  BILATERAL LOWER EXTREMITY VENOUS DUPLEX ULTRASOUND  Technique:  Gray-scale sonography with graded compression, as well as color Doppler and duplex ultrasound, were performed to evaluate the deep venous system of both lower extremities from the level of the common femoral vein through the popliteal and proximal calf veins.  Spectral Doppler was utilized to evaluate flow at rest and with distal augmentation maneuvers.  Comparison:  None.  Findings:  Normal compressibility of bilateral common femoral, superficial femoral, and popliteal veins is demonstrated, as well as the visualized proximal calf veins.  No filling defects to suggest DVT on  grayscale or color Doppler imaging.  Doppler waveforms show normal direction of venous flow, normal respiratory phasicity and response to augmentation.  IMPRESSION: No evidence of deep vein thrombosis, within either lower extremity.  Original Report Authenticated By: Waynard Reeds, M.D.   Dg Chest Port 1 View  05/17/2012  *RADIOLOGY REPORT*  Clinical Data: Dyspnea, biopsy yesterday  PORTABLE CHEST - 1 VIEW  Comparison: 05/15/2012  Findings: Increased interstitial markings.  While some of this may be chronic, superimposed interstitial edema is suspected.  No pleural effusion or pneumothorax.  Cardiomediastinal silhouette is within normal limits.  IMPRESSION: Suspected mild interstitial edema.  Original Report Authenticated By: Charline Bills, M.D.   Dg Chest Port 1 View  05/15/2012  *RADIOLOGY REPORT*  Clinical Data: Postop mediastinoscopy  PORTABLE CHEST - 1 VIEW  Comparison: Chest radiograph 05/07/2012 and CT chest abdomen pelvis 05/12/2012 and CT chest 01/13/2007  Findings: Lung volumes are low bilaterally.  Heart size appears within normal limits for portable technique and low lung volumes. There is crowding of the lung markings and mild bibasilar atelectasis.  No evidence of pneumothorax, pleural effusion or focal airspace. Surgical clips are seen in the right paratracheal region.  IMPRESSION: Low lung volumes and mild bibasilar atelectasis.  Postoperative changes in the right paratracheal region.  Negative for pneumothorax or other acute finding.  Original Report Authenticated By: Britta Mccreedy, M.D.   Dg Chest Port 1 View  05/07/2012  *RADIOLOGY REPORT*  Clinical Data: Fever.  Infiltrate.  PORTABLE CHEST - 1 VIEW  Comparison: Multiple priors most recently 05/17/2012 at 1535 hours. Chest CT 01/13/2007.  Findings: Left costophrenic angle excluded from view.  Left basilar subsegmental atelectasis.  Prominent right pericardial fat pad. Cardiopericardial silhouette is within normal limits.  No airspace  disease.  No effusion.  Calcified granuloma in the right upper lobe and punctate calcifications of the mediastinal lymph nodes again noted compatible with old granulomatous disease.  IMPRESSION: No acute cardiopulmonary disease.  Subsegmental left basilar atelectasis.  Original Report Authenticated By: Andreas Newport, M.D.   Dg Chest Port 1 View  05/07/2012  *RADIOLOGY REPORT*  Clinical Data: Weakness.  Fall.  Multiple sclerosis.  Smoker.  PORTABLE CHEST - 1 VIEW  Comparison: 06/14/2011 chest radiograph.  CT chest 01/13/2007  Findings: The normal heart size and pulmonary vascularity. Calcified granulomas in the mid lungs.  Nodular hilar opacities consistent with prominent lymph nodes and granulomas as seen on previous chest CT.  Mild emphysematous changes in the upper lungs. No focal airspace consolidation.  No blunting of costophrenic angles.  No pneumothorax.  Old left rib fracture.  IMPRESSION: Calcified granulomas with calcified hilar lymph nodes. Emphysematous changes.  No evidence of active pulmonary disease.  Original Report Authenticated By: Marlon Pel, M.D.   Dg Fluoro Guide Lumbar Puncture  05/11/2012  *RADIOLOGY REPORT*  Clinical Data: Multiple Sclerosis, fever and headaches.  DIAGNOSTIC LUMBAR PUNCTURE UNDER FLUOROSCOPIC GUIDANCE  Fluoroscopy time:  1.8. minutes.  Technique:  Informed consent was obtained from the patient prior to the procedure, including potential complications of headache, allergy, and pain. Lovenox was withheld for 1.5 days.  With the patient prone oblique, the lower back was prepped with Betadine. 1% Lidocaine was used for local anesthesia.  Lumbar puncture was performed at the L4-L5 level using a 12.7 cm 20  gauge needle with return of blood tinged CSF with an opening pressure of 19.2. cm water.   Only three ml of CSF were obtained for laboratory studies, even after placing the  patient in reverse Trendelenburg position. The patient tolerated the procedure well and there  were no apparent complications.  IMPRESSION: Slightly elevated opening pressure of 19.2 cm water.  Approximately 3 ml of blood-tinged CSF was obtained for pathological analysis.  Original Report Authenticated By: Brandon Melnick, M.D.    Microbiology: Recent Results (from the past 240 hour(s))  CULTURE, BLOOD (ROUTINE X 2)     Status: Normal   Collection Time   05/10/12  5:05 PM      Component Value Range Status Comment   Specimen Description BLOOD LEFT HAND   Final    Special Requests BOTTLES DRAWN AEROBIC AND ANAEROBIC 12CC   Final    Culture NO GROWTH 5 DAYS   Final    Report Status 05/15/2012 FINAL   Final   CULTURE, BLOOD (ROUTINE X 2)     Status: Normal   Collection Time   05/10/12  5:10 PM      Component Value Range Status Comment   Specimen Description BLOOD RIGHT ANTECUBITAL   Final    Special Requests BOTTLES DRAWN AEROBIC AND ANAEROBIC 12CC   Final    Culture NO GROWTH 5 DAYS   Final    Report Status 05/15/2012 FINAL   Final   TISSUE CULTURE     Status: Normal   Collection Time   05/15/12  7:30 AM      Component Value Range Status Comment   Specimen Description TISSUE LYMPH NODE   Final  Special Requests NO 2/MEDIASTINAL   Final    Gram Stain     Final    Value: FEW WBC PRESENT,BOTH PMN AND MONONUCLEAR     NO SQUAMOUS EPITHELIAL CELLS SEEN     NO ORGANISMS SEEN   Culture NO GROWTH 3 DAYS   Final    Report Status 05/19/2012 FINAL   Final   AFB CULTURE WITH SMEAR     Status: Normal (Preliminary result)   Collection Time   05/15/12  7:30 AM      Component Value Range Status Comment   Specimen Description TISSUE LYMPH NODE   Final    Special Requests NO 2/MEDIASTINAL   Final    ACID FAST SMEAR NO ACID FAST BACILLI SEEN   Final    Culture     Final    Value: CULTURE WILL BE EXAMINED FOR 6 WEEKS BEFORE ISSUING A FINAL REPORT   Report Status PENDING   Incomplete   FUNGUS CULTURE W SMEAR     Status: Normal (Preliminary result)   Collection Time   05/15/12  7:30 AM       Component Value Range Status Comment   Specimen Description TISSUE LYMPH NODE   Final    Special Requests NO 2/MEDIASTINAL   Final    Fungal Smear NO YEAST OR FUNGAL ELEMENTS SEEN   Final    Culture CULTURE IN PROGRESS FOR FOUR WEEKS   Final    Report Status PENDING   Incomplete      Labs: Basic Metabolic Panel:  Lab 05/20/12 8469 05/18/12 0630 05/16/12 1225  NA 143 141 137  K 3.5 3.0* 3.6  CL 107 103 100  CO2 27 27 25   GLUCOSE 112* 116* 112*  BUN 16 8 10   CREATININE 0.86 0.74 0.81  CALCIUM 9.1 8.6 8.9  MG -- -- --  PHOS -- -- 2.5   Liver Function Tests:  Lab 05/18/12 0630 05/17/12 1647 05/16/12 1225  AST 97* 67* --  ALT 129* 100* --  ALKPHOS 148* 121* --  BILITOT 1.4* 1.2 --  PROT 6.2 5.7* --  ALBUMIN 2.3* 2.1* 2.5*   No results found for this basename: LIPASE:5,AMYLASE:5 in the last 168 hours No results found for this basename: AMMONIA:5 in the last 168 hours CBC:  Lab 05/18/12 0630  WBC 10.6*  NEUTROABS --  HGB 11.2*  HCT 31.6*  MCV 89.5  PLT 293   Cardiac Enzymes:  Lab 05/17/12 0530  CKTOTAL 60  CKMB --  CKMBINDEX --  TROPONINI --   BNP: BNP (last 3 results) No results found for this basename: PROBNP:3 in the last 8760 hours CBG:  Lab 05/20/12 0750 05/19/12 2132 05/19/12 1718 05/19/12 1117 05/19/12 0748  GLUCAP 103* 100* 125* 132* 100*    Time coordinating discharge: 50 minutes  Signed:  Guadalupe Nickless  [facility] 05/20/2012, 8:46 AM

## 2012-05-20 NOTE — Progress Notes (Signed)
05/20/12 Patient to be sent to Rehab center after leaving [facility] . IV site removed. Valeda Malm

## 2012-05-21 LAB — QUANTIFERON TB GOLD ASSAY (BLOOD)

## 2012-05-22 LAB — HISTOPLASMA ANTIGEN, URINE: Histoplasma Antigen, urine: <0.5 ng/mL

## 2012-05-29 LAB — MISCELLANEOUS TEST

## 2012-06-12 LAB — FUNGUS CULTURE W SMEAR: Fungal Smear: NONE SEEN

## 2012-06-27 LAB — AFB CULTURE WITH SMEAR (NOT AT ARMC)

## 2012-08-11 ENCOUNTER — Other Ambulatory Visit (HOSPITAL_COMMUNITY): Payer: Self-pay | Admitting: Family Medicine

## 2012-08-11 DIAGNOSIS — G35 Multiple sclerosis: Secondary | ICD-10-CM

## 2012-08-11 DIAGNOSIS — IMO0002 Reserved for concepts with insufficient information to code with codable children: Secondary | ICD-10-CM

## 2012-08-18 ENCOUNTER — Ambulatory Visit (HOSPITAL_COMMUNITY)
Admission: RE | Admit: 2012-08-18 | Discharge: 2012-08-18 | Disposition: A | Discharge status: Home / Self Care | Payer: Medicare Other | Source: Ambulatory Visit | Attending: Family Medicine | Admitting: Family Medicine

## 2012-08-18 DIAGNOSIS — G35 Multiple sclerosis: Secondary | ICD-10-CM

## 2012-08-18 DIAGNOSIS — M949 Disorder of cartilage, unspecified: Secondary | ICD-10-CM | POA: Insufficient documentation

## 2012-08-18 DIAGNOSIS — IMO0002 Reserved for concepts with insufficient information to code with codable children: Secondary | ICD-10-CM

## 2012-08-18 DIAGNOSIS — M899 Disorder of bone, unspecified: Secondary | ICD-10-CM | POA: Insufficient documentation

## 2012-10-23 ENCOUNTER — Encounter: Payer: Self-pay | Admitting: Emergency Medicine

## 2012-10-23 ENCOUNTER — Ambulatory Visit (INDEPENDENT_AMBULATORY_CARE_PROVIDER_SITE_OTHER): Payer: Medicare Other | Admitting: Emergency Medicine

## 2012-10-23 ENCOUNTER — Ambulatory Visit (INDEPENDENT_AMBULATORY_CARE_PROVIDER_SITE_OTHER)
Admission: RE | Admit: 2012-10-23 | Discharge: 2012-10-23 | Disposition: A | Discharge status: Home / Self Care | Payer: Medicare Other | Source: Ambulatory Visit | Attending: Emergency Medicine | Admitting: Emergency Medicine

## 2012-10-23 VITALS — BP 110/78 | HR 78 | Temp 98.1°F | Ht 75.0 in | Wt 285.0 lb

## 2012-10-23 DIAGNOSIS — D869 Sarcoidosis, unspecified: Secondary | ICD-10-CM

## 2012-10-23 DIAGNOSIS — Z720 Tobacco use: Secondary | ICD-10-CM

## 2012-10-23 DIAGNOSIS — F17200 Nicotine dependence, unspecified, uncomplicated: Secondary | ICD-10-CM

## 2012-10-23 NOTE — Progress Notes (Signed)
Subjective:    Patient ID: Chad Vasquez, male    DOB: December 29, 1960, 52 y.o.   MRN: [id number]  HPI 52 yo man, former smoker (40pk-yrs), hx of MS, OSA not on CPAP, sarcoidosis dx by mediastinal LAD bx in 9/13 by mediastinoscopy. He had negative quant gold, no evidence lymphoma. His primary sx associated with sarcoidosis has been fever. Some dyspnea, but this is actually better since he stopped smoking in 7/13.  Some occas wheeze, some cough in the am - better. He is currently on pred 10mg , weaning w Dr Dierdre Forth.    Review of Systems  Constitutional: Positive for activity change and appetite change. Negative for fever and unexpected weight change.  HENT: Positive for congestion and trouble swallowing. Negative for ear pain, nosebleeds, sore throat, rhinorrhea, sneezing, dental problem, postnasal drip and sinus pressure.   Eyes: Negative for redness and itching.  Respiratory: Positive for shortness of breath and wheezing. Negative for cough and chest tightness.   Cardiovascular: Positive for palpitations. Negative for leg swelling.  Gastrointestinal: Negative for nausea and vomiting.  Genitourinary: Negative for dysuria.  Musculoskeletal: Positive for arthralgias. Negative for joint swelling.  Skin: Negative for rash.  Neurological: Positive for dizziness, light-headedness and headaches.  Hematological: Does not bruise/bleed easily.  Psychiatric/Behavioral: Negative for dysphoric mood. The patient is nervous/anxious.     Past Medical History  Diagnosis Date  . Multiple sclerosis 2010  . HTN (hypertension)   . Polysubstance abuse      Family History  Problem Relation Age of Onset  . Heart failure Mother   . Stroke Mother   . Heart failure Father   . Cancer Sister   . Seizures Brother      History   Social History  . Marital Status: Single    Spouse Name: N/A    Number of Children: 0  . Years of Education: N/A   Occupational History  . auto technician      disabled     Social History Main Topics  . Smoking status: Former Smoker -- 1.0 packs/day for 30 years    Types: Cigarettes    Quit date: 05/01/2012  . Smokeless tobacco: Never Used  . Alcohol Use: No     Comment: quit 05/01/2012  . Drug Use: Yes    Special: Cocaine, Marijuana     Comment: 05/01/2012 last use  . Sexually Active: No   Other Topics Concern  . Not on file   Social History Narrative  . No narrative on file     No Known Allergies   Outpatient Prescriptions Prior to Visit  Medication Sig Dispense Refill  . acetaminophen (TYLENOL) 325 MG tablet Take 2 tablets (650 mg total) by mouth every 6 (six) hours as needed for pain or fever (or Fever >/= 101).      . carbamazepine (TEGRETOL) 200 MG tablet Take 200 mg by mouth 3 (three) times daily.      . Cholecalciferol (VITAMIN D) 2000 UNITS tablet Take 2,000 Units by mouth every morning.      . gabapentin (NEURONTIN) 300 MG capsule Take 300 mg by mouth 3 (three) times daily.      Marland Kitchen glatiramer (COPAXONE) 20 MG/ML injection Inject 20 mg into the skin every morning.      Marland Kitchen HYDROcodone-acetaminophen (VICODIN ES) 7.5-750 MG per tablet Take 1 tablet by mouth every 6 (six) hours as needed for pain. For pain  30 tablet  0  . pantoprazole (PROTONIX) 40 MG tablet Take 1 tablet (  40 mg total) by mouth 2 (two) times daily before a meal.      . insulin aspart (NOVOLOG) 100 UNIT/ML injection Inject 0-15 Units into the skin 3 (three) times daily with meals.  1 vial     Last reviewed on 10/23/2012  4:44 PM by Lazarus Salines, RN       Objective:   Physical Exam Filed Vitals:   10/23/12 1644  BP: 110/78  Pulse: 78  Temp: 98.1 F (36.7 C)   Gen: Pleasant, well-nourished, in no distress,  normal affect, in wheelchair  ENT: No lesions,  mouth clear,  oropharynx clear, no postnasal drip  Neck: No JVD, no TMG, no carotid bruits  Lungs: No use of accessory muscles, no dullness to percussion, clear without rales or rhonchi  Cardiovascular:  RRR, heart sounds normal, no murmur or gallops, 1+ peripheral edema  Musculoskeletal: No deformities, no cyanosis or clubbing  Neuro: alert, in wheelchair, weakness R LE although able to stand, moves all ext  Skin: Warm, no lesions or rashes   CT Chest 05/12/12 --  Comparison: Multiple exams, including 05/07/2012  CT CHEST  Findings: Right paratracheal node measures 1.3 cm in short axis,  image 17 of series 3. An AP window lymph node measures 1.6 cm in  short axis, image 24 of series 3. Right hilar adenopathy noted  with one right hilar node measuring 1.5 cm in short axis, image 32  of series 3. Left hilar adenopathy noted with several small  calcifications, and also scattered infrahilar nodes. Conglomerate  subcarinal adenopathy measures 2.2 cm in short axis, image 30 of  series 3. Scattered small paraesophageal lymph nodes are present  in the lower thorax.  Heart size is within normal limits. A small hiatal hernia is  present.  A 4 mm densely calcified right upper lobe pulmonary nodule on image  14 of series 4 is compatible with calcified granuloma.  Centrilobular emphysema noted. There is mild atelectasis or  scarring in the posterior basal segments of both lower lobes.  Several additional small calcified right upper lobe granulomas are  present. There are several small calcified granulomas in the left  lung.  IMPRESSION:  1. Pathologic mediastinal and hilar adenopathy. No airway  thickening or pleural nodularity to suggest pulmonary parenchymal  findings of sarcoidosis. The appearance could reflect lymphoma.  Tissue diagnosis is likely warranted.  2. Small hiatal hernia.  3. Old granulomatous disease.      Assessment & Plan:  Sarcoidosis Fevers have been controlled. He is currently on pred 10 and is weaning. No other overt sx of sarcoidosis. I do question whether neurological sx have been due to sarcoid all along?? Need to get Dr Imagene Gurney opinion about whether this could be  neurosarcoid instead of MS.  - continue to wean pred - full PFT, suspect he will need BD's for sarcoidosis + presumed COPD - want to re-eval with dr Sandria Manly whether this could be neuro-sarcoid.  - follow up with Dr Dierdre Forth - consider CT chest to follow his LAD.  - rov 1 month  Tobacco abuse Quit - suspect that he has COPD - full PFT to quantify his AFL - will probably start BD's in the future

## 2012-10-23 NOTE — Assessment & Plan Note (Signed)
Fevers have been controlled. He is currently on pred 10 and is weaning. No other overt sx of sarcoidosis. I do question whether neurological sx have been due to sarcoid all along?? Need to get Dr Imagene Gurney opinion about whether this could be neurosarcoid instead of MS.  - continue to wean pred - full PFT, suspect he will need BD's for sarcoidosis + presumed COPD - want to re-eval with dr Sandria Manly whether this could be neuro-sarcoid.  - follow up with Dr Dierdre Forth - consider CT chest to follow his LAD.  - rov 1 month

## 2012-10-23 NOTE — Assessment & Plan Note (Addendum)
Quit - suspect that he has COPD - full PFT to quantify his AFL - will probably start BD's in the future

## 2012-10-23 NOTE — Patient Instructions (Addendum)
Wean your prednisone as planned by Dr Dierdre Forth We will perform CXR today We will try to move up your appointment with Dr Sandria Manly for follow up of MS and sarcoid.  We will perform full pulmonary function testing Get your CPAP mask as planned Follow with Dr Delton Coombes in 1 month

## 2012-11-24 ENCOUNTER — Other Ambulatory Visit (HOSPITAL_COMMUNITY): Payer: Self-pay | Admitting: Cardiovascular Disease

## 2012-11-24 DIAGNOSIS — R609 Edema, unspecified: Secondary | ICD-10-CM

## 2012-11-29 ENCOUNTER — Ambulatory Visit (INDEPENDENT_AMBULATORY_CARE_PROVIDER_SITE_OTHER): Payer: Medicare Other | Admitting: Emergency Medicine

## 2012-11-29 ENCOUNTER — Encounter: Payer: Self-pay | Admitting: Emergency Medicine

## 2012-11-29 VITALS — BP 120/66 | HR 64 | Temp 98.3°F | Ht 75.0 in | Wt 290.0 lb

## 2012-11-29 DIAGNOSIS — J4489 Other specified chronic obstructive pulmonary disease: Secondary | ICD-10-CM

## 2012-11-29 DIAGNOSIS — G35 Multiple sclerosis: Secondary | ICD-10-CM

## 2012-11-29 DIAGNOSIS — D869 Sarcoidosis, unspecified: Secondary | ICD-10-CM

## 2012-11-29 DIAGNOSIS — J449 Chronic obstructive pulmonary disease, unspecified: Secondary | ICD-10-CM

## 2012-11-29 DIAGNOSIS — G4733 Obstructive sleep apnea (adult) (pediatric): Secondary | ICD-10-CM

## 2012-11-29 LAB — PULMONARY FUNCTION TEST

## 2012-11-29 NOTE — Assessment & Plan Note (Signed)
Started on CPAP this month and tolerating well

## 2012-11-29 NOTE — Progress Notes (Signed)
Subjective:    Patient ID: Chad Vasquez, male    DOB: 11-14-1960, 52 y.o.   MRN: [id number]  HPI 52 yo man, former smoker (40pk-yrs), hx of MS, OSA not on CPAP, sarcoidosis dx by mediastinal LAD bx in 9/13 by mediastinoscopy. He had negative quant gold, no evidence lymphoma. His primary sx associated with sarcoidosis has been fever. Some dyspnea, but this is actually better since he stopped smoking in 7/13.  Some occas wheeze, some cough in the am - better. He is currently on pred 10mg , weaning w Dr Dierdre Forth.   ROV 11/29/12 -- follow up for dyspnea, wheeze in setting sarcoidosis, tobacco hx. 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 He is followed by Dr Sandria Manly for MS vs neuro sarcoid, saw him yesterday and believes he has principally MS. Steroids are weaned to 5mg  daily, being managed by Dr Dierdre Forth. He started CPAP this month - compliance good, seems to be tolerating. He may feel a bit better during the day.    Review of Systems  Constitutional: Positive for activity change and appetite change. Negative for fever and unexpected weight change.  HENT: Positive for congestion and trouble swallowing. Negative for ear pain, nosebleeds, sore throat, rhinorrhea, sneezing, dental problem, postnasal drip and sinus pressure.   Eyes: Negative for redness and itching.  Respiratory: Positive for shortness of breath and wheezing. Negative for cough and chest tightness.   Cardiovascular: Positive for palpitations. Negative for leg swelling.  Gastrointestinal: Negative for nausea and vomiting.  Genitourinary: Negative for dysuria.  Musculoskeletal: Positive for arthralgias. Negative for joint swelling.  Skin: Negative for rash.  Neurological: Positive for dizziness, light-headedness and headaches.  Hematological: Does not bruise/bleed easily.  Psychiatric/Behavioral: Negative for dysphoric mood. The patient is nervous/anxious.         Objective:   Physical Exam Filed Vitals:   11/29/12 1554  BP: 120/66  Pulse: 64  Temp: 98.3 F (36.8 C)   Gen: Pleasant, well-nourished, in no distress,  normal affect, in wheelchair  ENT: No lesions,  mouth clear,  oropharynx clear, no postnasal drip  Neck: No JVD, no TMG, no carotid bruits  Lungs: No use of accessory muscles, no dullness to percussion, clear without rales or rhonchi  Cardiovascular: RRR, heart sounds normal, no murmur or gallops, 1+ peripheral edema  Musculoskeletal: No deformities, no cyanosis or clubbing  Neuro: alert, in wheelchair, weakness R LE although able to stand, moves all ext  Skin: Warm, no lesions or rashes   CT Chest 05/12/12 --  Comparison: Multiple exams, including 05/07/2012  CT CHEST  Findings: Right paratracheal node measures 1.3 cm in short axis,  image 17 of series 3. An AP window lymph node measures 1.6 cm in  short axis, image 24 of series 3. Right hilar adenopathy noted  with one right hilar node measuring 1.5 cm in short axis, image 32  of series 3. Left hilar adenopathy noted with several small  calcifications, and also scattered infrahilar nodes. Conglomerate  subcarinal adenopathy measures 2.2 cm in short axis, image 30 of  series 3. Scattered small paraesophageal lymph nodes are present  in the lower thorax.  Heart size is within normal limits. A small hiatal hernia is  present.  A 4 mm densely calcified right upper lobe pulmonary nodule on image  14 of series 4 is compatible with calcified granuloma.  Centrilobular emphysema noted. There is mild atelectasis or  scarring in the posterior basal segments  of both lower lobes.  Several additional small calcified right upper lobe granulomas are  present. There are several small calcified granulomas in the left  lung.  IMPRESSION:  1. Pathologic mediastinal and hilar adenopathy. No airway  thickening or pleural nodularity to suggest pulmonary parenchymal  findings of sarcoidosis. The  appearance could reflect lymphoma.  Tissue diagnosis is likely warranted.  2. Small hiatal hernia.  3. Old granulomatous disease.      Assessment & Plan:  Sarcoidosis Weaning prednisone as per Dr Shawnee Knapp plans Will need to consider CT scan chest in the future to better eval LAD, can likely defer for now in absence significant interstitial disease.   Obstructive sleep apnea Started on CPAP this month and tolerating well  Multiple sclerosis exacerbation Followed by Dr Sandria Manly. I remain concerned that there may be a component neurosarcoid here.   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  - start albuterol prn to see if he benefits. If so, consider starting scheduled therapy.  - rov 2 months to assess progress

## 2012-11-29 NOTE — Progress Notes (Signed)
PFT done today. 

## 2012-11-29 NOTE — Assessment & Plan Note (Signed)
Followed by Dr Sandria Manly. I remain concerned that there may be a component neurosarcoid here.

## 2012-11-29 NOTE — Patient Instructions (Addendum)
Follow with Dr Sandria Manly and Neurology as planned Wean your prednisone as planned by Dr Dierdre Forth Use albuterol 2 puffs if needed for shortness of breath. Keep track of your symptoms on this medication and we will decide whether to put you on an every-day inhaler.  Follow with Dr Delton Coombes in 2 months or sooner if you have any problems.

## 2012-11-29 NOTE — Assessment & Plan Note (Signed)
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  - start albuterol prn to see if he benefits. If so, consider starting scheduled therapy.  - rov 2 months to assess progress

## 2012-11-29 NOTE — Assessment & Plan Note (Signed)
Weaning prednisone as per Dr Shawnee Knapp plans Will need to consider CT scan chest in the future to better eval LAD, can likely defer for now in absence significant interstitial disease.

## 2012-11-30 ENCOUNTER — Ambulatory Visit (HOSPITAL_COMMUNITY)
Admission: RE | Admit: 2012-11-30 | Discharge: 2012-11-30 | Disposition: A | Discharge status: Home / Self Care | Payer: Medicare Other | Source: Ambulatory Visit | Attending: Cardiovascular Disease | Admitting: Cardiovascular Disease

## 2012-11-30 DIAGNOSIS — E785 Hyperlipidemia, unspecified: Secondary | ICD-10-CM | POA: Insufficient documentation

## 2012-11-30 DIAGNOSIS — I2510 Atherosclerotic heart disease of native coronary artery without angina pectoris: Secondary | ICD-10-CM | POA: Insufficient documentation

## 2012-11-30 DIAGNOSIS — G35 Multiple sclerosis: Secondary | ICD-10-CM | POA: Insufficient documentation

## 2012-11-30 DIAGNOSIS — I10 Essential (primary) hypertension: Secondary | ICD-10-CM | POA: Insufficient documentation

## 2012-11-30 DIAGNOSIS — R609 Edema, unspecified: Secondary | ICD-10-CM | POA: Insufficient documentation

## 2012-11-30 DIAGNOSIS — G4733 Obstructive sleep apnea (adult) (pediatric): Secondary | ICD-10-CM | POA: Insufficient documentation

## 2012-11-30 DIAGNOSIS — I079 Rheumatic tricuspid valve disease, unspecified: Secondary | ICD-10-CM | POA: Insufficient documentation

## 2012-11-30 DIAGNOSIS — D869 Sarcoidosis, unspecified: Secondary | ICD-10-CM | POA: Insufficient documentation

## 2012-11-30 NOTE — Progress Notes (Signed)
[facility]   2D echo completed 11/30/2012.   Cindy Prabhjot Maddux, RDCS  

## 2012-12-14 ENCOUNTER — Encounter: Payer: Self-pay | Admitting: Emergency Medicine

## 2012-12-25 ENCOUNTER — Ambulatory Visit: Payer: Medicare Other | Admitting: Physical Therapy

## 2013-01-01 ENCOUNTER — Ambulatory Visit (HOSPITAL_COMMUNITY)
Admission: RE | Admit: 2013-01-01 | Discharge: 2013-01-01 | Disposition: A | Discharge status: Home / Self Care | Payer: Medicare Other | Source: Ambulatory Visit | Attending: Neurology | Admitting: Neurology

## 2013-01-01 DIAGNOSIS — R262 Difficulty in walking, not elsewhere classified: Secondary | ICD-10-CM | POA: Insufficient documentation

## 2013-01-01 DIAGNOSIS — I10 Essential (primary) hypertension: Secondary | ICD-10-CM | POA: Insufficient documentation

## 2013-01-01 DIAGNOSIS — IMO0001 Reserved for inherently not codable concepts without codable children: Secondary | ICD-10-CM | POA: Insufficient documentation

## 2013-01-01 DIAGNOSIS — M6281 Muscle weakness (generalized): Secondary | ICD-10-CM | POA: Insufficient documentation

## 2013-01-01 NOTE — Evaluation (Signed)
Physical Therapy Evaluation  Patient Details  Name: Chad Vasquez MRN: [id number] Date of Birth: 05-17-1961  Today's Date: 01/01/2013 Time: 1310-1355 PT Time Calculation (min): 45 min Charges: 1 evaluation             Visit#: 1 of 8  Re-eval: 01/31/13 Assessment Diagnosis: Generalized weakness Next MD Visit: Dr. Sandria Manly - 4 months Prior Therapy: Previous PT and OT after hospitalization in 8/13  Authorization: BCBS Medicare    Authorization Time Period:    Authorization Visit#: 1 of 10   Past Medical History:  Past Medical History  Diagnosis Date  . Multiple sclerosis 2010  . HTN (hypertension)   . Polysubstance abuse    Past Surgical History:  Past Surgical History  Procedure Laterality Date  . Mediastinoscopy  05/15/2012    Procedure: MEDIASTINOSCOPY;  Surgeon: Loreli Slot, MD;  Location: [facility] OR;  Service: Thoracic;  Laterality: N/A;    Subjective Symptoms/Limitations Symptoms: significant PMH: sarcodiosis (diagnosis 8/13), hyperlipidemia, hypersomnia, essential hypertension, blurred vision, cephalgia, MS (replase-remitting), myelopathy, hx of LLE weakness, back pain, pnemonia,  Pertinent History: Pt is a 52 year old male referred to PT for generalized weakness from MS, he sought advice from his MD who diagnosed with MS and has started on low dose baclofen.  He reports that everytime he has a relapse he is typically treated with steroids and therapy.  Currently he is aware of exercises to do during his replasing events.  C/o is difficulty walking, pain to his joints on his right side, embarrasment of ambulating with a RW slowly How long can you sit comfortably?: no difficulty  How long can you stand comfortably?: 15 minutes How long can you walk comfortably?: indoor surfaces 15 minutes. scare to ambulate outdorrs.  Patient Stated Goals: "I want to be able to walk with just a cane or on my own." Pain Assessment Currently in Pain?: Yes Pain Score:   1 Pain Location:  Leg Pain Orientation: Left;Right Pain Type: Chronic pain Pain Relieving Factors: hydrocodone  Precautions/Restrictions  Precautions Precautions: Fall  Balance Screening Balance Screen Has the patient fallen in the past 6 months: Yes How many times?: 1 Has the patient had a decrease in activity level because of a fear of falling? : Yes Is the patient reluctant to leave their home because of a fear of falling? 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, on disablity for MS Comments: enjoys working in his garage and going to [employer]  Cognition/Observation Observation/Other Assessments Observations: integumentary: darkness to outside to eyes  Sensation/Coordination/Flexibility/Functional Tests Functional Tests Functional Tests: Activity-specific Balance Confidence scale: (ABC): 38% Functional Tests: 30 sec sit to stands: 1x complete  Assessment RLE Strength Right Hip Flexion: 3+/5 Right Hip Extension: 3/5 Right Hip ABduction: 4/5 Right Hip ADduction: 4/5 Right Knee Flexion: 5/5 Right Knee Extension: 5/5 Right Ankle Dorsiflexion: 5/5 LLE Strength Left Hip Flexion: 3/5 Left Hip Extension: 3/5 Left Hip ABduction: 3+/5 Left Hip ADduction: 3+/5 Left Knee Flexion: 4/5 Left Knee Extension: 4/5 Left Ankle Dorsiflexion: 5/5  Mobility/Balance  Ambulation/Gait Ambulation/Gait: Yes Ambulation/Gait Assistance: 6: Modified independent (Device/Increase time) Ambulation Distance (Feet): 120 Feet (2 minutes) Assistive device: Rolling walker Gait Pattern: Decreased trunk rotation;Trunk flexed;Decreased hip/knee flexion -  right;Decreased  hip/knee flexion - left;Ataxic Static Standing Balance Static Standing - Comment/# of Minutes: each position held for a max of 10 seconds Single Leg Stance - Right Leg: 3 (impaired hip and ) Single Leg Stance - Left Leg: 3 Tandem Stance - Right Leg: 4 Tandem Stance - Left Leg: 3 Rhomberg - Eyes Opened: 10 Rhomberg - Eyes Closed: 10    Physical Therapy Assessment and Plan PT Assessment and Plan Clinical Impression Statement: Pt is a 52 year old male referred to PT for generalized weakness and difficulty walking secondary to relapse of MS symptoms.  He has a significant PMH including sacrodosis. He comes in today with significant BLE weakness, difficulty walking and impaired confidence with overall balance.   Pt will benefit from skilled therapeutic intervention in order to improve on the following deficits: Difficulty walking;Decreased strength;Decreased mobility;Impaired perceived functional ability;Impaired sensation;Improper body mechanics;Decreased balance PT Frequency: Min 2X/week PT Duration: 6 weeks PT Treatment/Interventions: Gait training;Stair training;Functional mobility training;Therapeutic exercise;Therapeutic activities;Balance training;Neuromuscular re-education;Patient/family education PT Plan: please complete berg balance test and dynamic gait index. [place] Balance program. gait training to improve confidence with gait and function, progress towards functional goals by incorportating balance and strengtheing activities.  Pt has a hx of attending PT and is knowlegeable on strengthening exercises.     Goals Home Exercise Program Pt will Perform Home Exercise Program: Independently PT Goal: Perform Home Exercise Program - Progress: Goal set today PT Short Term Goals Time to Complete Short Term Goals: 3 weeks PT Short Term Goal 1: Pt will improve his LE strength in order to begin ambulating with a SPC for 5 minutes.  PT Short Term Goal 2: Pt will improve his gait speed and  ambulate 300 feet in 2 minutes w/LRAD for improved community ambulation.  PT Long Term Goals Time to Complete Long Term Goals:  (6 weeks) PT Long Term Goal 1: Pt will improve his dynamic balance and score greater than a 45 on the  BBS and 20 on the DGI PT Long Term Goal 2: Pt will improve his BLE functional strenth to [facility] in order to ambulate 20 minutes with a SPC for improved community mobility.  Long Term Goal 3: Pt will improve his ABC to greater than 75% for improved percieved functional ability in to community to be able to attend a spring fair.   Problem List Patient Active Problem List  Diagnosis  . Multiple sclerosis exacerbation  . Fever  . HTN (hypertension)  . COPD (chronic obstructive pulmonary disease)  . Chronic pain  . Polysubstance abuse  . Lower extremity pain  . Sarcoidosis  . Paraplegia  . Obstructive sleep apnea    PT Plan of Care PT Home Exercise Plan: Level A otago program PT Patient Instructions: POC, HEP.  Consulted and Agree with Plan of Care: Patient  GP Functional Assessment Tool Used: ABC: 38% Functional Limitation: Mobility: Walking and moving around Mobility: Walking and Moving Around Current Status (W0981): At least 60 percent but less than 80 percent impaired, limited or restricted Mobility: Walking and Moving Around Goal Status [phone]): At least 20 percent but less than 40 percent impaired, limited or restricted  Tahnee Cifuentes, PT 01/01/2013, 3:02 PM  Physician Documentation Your signature is required to indicate approval of the treatment plan as stated above.  Please sign and either send electronically or make a copy of this report for your files and return this physician signed original.   Please mark one 1.__approve of plan  2. ___approve of  plan with the following conditions.   ______________________________                                                          _____________________ Physician Signature                                                                                                              Date

## 2013-01-03 ENCOUNTER — Ambulatory Visit (HOSPITAL_COMMUNITY): Payer: Medicare Other | Admitting: Physical Therapy

## 2013-01-08 ENCOUNTER — Ambulatory Visit (HOSPITAL_COMMUNITY)
Admission: RE | Admit: 2013-01-08 | Discharge: 2013-01-08 | Disposition: A | Discharge status: Home / Self Care | Payer: Medicare Other | Source: Ambulatory Visit | Attending: Neurology | Admitting: Neurology

## 2013-01-08 DIAGNOSIS — IMO0001 Reserved for inherently not codable concepts without codable children: Secondary | ICD-10-CM | POA: Insufficient documentation

## 2013-01-08 DIAGNOSIS — I10 Essential (primary) hypertension: Secondary | ICD-10-CM | POA: Insufficient documentation

## 2013-01-08 DIAGNOSIS — M6281 Muscle weakness (generalized): Secondary | ICD-10-CM | POA: Insufficient documentation

## 2013-01-08 DIAGNOSIS — R262 Difficulty in walking, not elsewhere classified: Secondary | ICD-10-CM | POA: Insufficient documentation

## 2013-01-08 NOTE — Progress Notes (Signed)
Physical Therapy Treatment Patient Details  Name: Chad Vasquez MRN: [id number] Date of Birth: 07-08-1961  Today's Date: 01/08/2013 Time: 1350-1430 PT Time Calculation (min): 40 min Charges: PPT x15', NMR x 15', Self Care: x10  Visit#: 2 of 8  Re-eval: 01/31/13    Authorization: BCBS Medicare  Authorization Time Period:    Authorization Visit#: 2 of 10   Subjective: Symptoms/Limitations Symptoms: Pt reports that he had a spell last week and he was down and out for about 2 days.  He states today is feeling much better and has been doing some of his HEP.  Pain Assessment Currently in Pain?: No/denies  Precautions/Restrictions     Exercise/Treatments Mobility/Balance     Berg Balance Test Sit to Stand: Able to stand without using hands and stabilize independently Standing Unsupported: Able to stand safely 2 minutes Sitting with Back Unsupported but Feet Supported on Floor or Stool: Able to sit safely and securely 2 minutes Stand to Sit: Sits safely with minimal use of hands Transfers: Able to transfer safely, definite need of hands Standing Unsupported with Eyes Closed: Able to stand 10 seconds safely Standing Ubsupported with Feet Together: Able to place feet together independently and stand for 1 minute with supervision From Standing, Reach Forward with Outstretched Arm: Can reach forward >5 cm safely (2") From Standing Position, Pick up Object from Floor: Able to pick up shoe, needs supervision From Standing Position, Turn to Look Behind Over each Shoulder: Turn sideways only but maintains balance Turn 360 Degrees: Able to turn 360 degrees safely but slowly Standing Unsupported, Alternately Place Feet on Step/Stool: Able to complete >2 steps/needs minimal assist Standing Unsupported, One Foot in Front: Able to take small step independently and hold 30 seconds Standing on One Leg: Unable to try or needs assist to prevent fall Total Score: 38 Dynamic Gait Index Level  Surface: Moderate Impairment Change in Gait Speed: Moderate Impairment Gait with Horizontal Head Turns: Moderate Impairment Gait with Vertical Head Turns: Moderate Impairment Gait and Pivot Turn: Moderate Impairment Step Over Obstacle: Moderate Impairment Step Around Obstacles: Moderate Impairment Steps: Moderate Impairment Total Score: 8  Standing Heel Raises: 10 reps Knee Flexion: Both;15 reps Other Standing Knee Exercises: Hip abduction and extensin BLE x10 each Retro Gait: 2 reps Sidestepping: 2 reps (by large mats) Other Standing Exercises: forward gait 2 reps    Physical Therapy Assessment and Plan PT Assessment and Plan Clinical Impression Statement: BBS and DGI completed.  Introduced pt to [employer] to improve LE strength and improve balance. Discussed energy conservation techniques during rest breaks.   PT Plan: Continue to improve functional strength with needed rest breaks in order to decrease risk of relapse.  Continue to progress confidence with balance activities in safe locations.     Goals    Problem List Patient Active Problem List  Diagnosis  . Multiple sclerosis exacerbation  . Fever  . HTN (hypertension)  . COPD (chronic obstructive pulmonary disease)  . Chronic pain  . Polysubstance abuse  . Lower extremity pain  . Sarcoidosis  . Paraplegia  . Obstructive sleep apnea    PT Plan of Care PT Patient Instructions: Answered questions regarding diagnosis, energy conservation techniques, home set up for future.  Consulted and Agree with Plan of Care: Patient  GP Functional Assessment Tool Used: ABC: 38% Functional Limitation: Mobility: Walking and moving around Mobility: Walking and Moving Around Current Status (N8295): At least 60 percent but less than 80 percent impaired, limited or restricted Mobility:  Walking and Moving Around Goal Status [phone]): At least 20 percent but less than 40 percent impaired, limited or restricted  Roselinda Bahena 01/08/2013, 2:33 PM

## 2013-01-10 ENCOUNTER — Ambulatory Visit (HOSPITAL_COMMUNITY)
Admission: RE | Admit: 2013-01-10 | Discharge: 2013-01-10 | Disposition: A | Discharge status: Home / Self Care | Payer: Medicare Other | Source: Ambulatory Visit | Attending: Neurology | Admitting: Neurology

## 2013-01-10 ENCOUNTER — Ambulatory Visit (HOSPITAL_COMMUNITY): Payer: Medicare Other | Admitting: Physical Therapy

## 2013-01-10 NOTE — Progress Notes (Signed)
Physical Therapy Treatment Patient Details  Name: Chad Vasquez MRN: [id number] Date of Birth: 1961-08-24  Today's Date: 01/10/2013 Time: 4098-1191 PT Time Calculation (min): 42 min Charge: Therex 23', NMR 15'  Visit#: 3 of 8  Re-eval: 01/31/13 Assessment Diagnosis: Generalized weakness Next MD Visit: Dr. Sandria Manly -  Prior Therapy: Previous PT and OT after hospitalization in 8/13  Authorization: BCBS Medicare  Authorization Time Period:    Authorization Visit#: 3 of 10   Subjective: Symptoms/Limitations Symptoms: No pain, reports compliance with HEP.    Precautions/Restrictions  Precautions Precautions: Fall  Probation officer for Strengthening Cybex Knee Extension: 2PL 15x Cybex Knee Flexion: 3.5 PL 15x Standing Heel Raises: 10 reps Knee Flexion: Both;15 reps Other Standing Knee Exercises: Hip abduction and extensin BLE x10 each  Balance Exercises Standing Retro Gait: 2 reps Sidestepping: 2 reps   Physical Therapy Assessment and Plan PT Assessment and Plan Clinical Impression Statement: Progressed LE strengthening and balance activities with 4 rest breaks for energy conservation.  Cueing for spatial awareness with LOB episodes improved balance and completed sidestepping and retro gait with no AD min guard/assistance.  Added cybex strengthening machines for quad and hamstring strengthening without difficutly just limited by musculature fatigue at end of session.  No reports of pain. PT Plan: Continue to improve functional strength with needed rest breaks in order to decrease risk of relapse.  Continue to progress confidence with balance activities in safe locations.     Goals    Problem List Patient Active Problem List  Diagnosis  . Multiple sclerosis exacerbation  . Fever  . HTN (hypertension)  . COPD (chronic obstructive pulmonary disease)  . Chronic pain  . Polysubstance abuse  . Lower extremity pain  . Sarcoidosis  . Paraplegia  . Obstructive  sleep apnea    PT - End of Session Activity Tolerance: Patient tolerated treatment well General Behavior During Session: [facility] for tasks performed Cognition: [facility] for tasks performed  GP    Juel Burrow 01/10/2013, 3:01 PM

## 2013-01-15 ENCOUNTER — Ambulatory Visit (HOSPITAL_COMMUNITY)
Admission: RE | Admit: 2013-01-15 | Discharge: 2013-01-15 | Disposition: A | Discharge status: Home / Self Care | Payer: Medicare Other | Source: Ambulatory Visit | Attending: Neurology | Admitting: Neurology

## 2013-01-15 NOTE — Progress Notes (Signed)
Physical Therapy Treatment Patient Details  Name: Chad Vasquez MRN: [id number] Date of Birth: 1961-01-20  Today's Date: 01/15/2013 Time: 1350-1430 PT Time Calculation (min): 40 min  Visit#: 4 of 8  Re-eval: 01/31/13 Authorization: BCBS Medicare  Authorization Visit#: 4 of 10  Charges:  therex 38'  Subjective: Symptoms/Limitations Symptoms: Pt. states he's been getting dizzy and nearly falling since Thursday with symptoms sounding similiar to vertigo.  Suggested to patient he see MD regarding. Pain Assessment Currently in Pain?: No/denies Pain Score: 0-No pain   Exercise/Treatments Aerobic Stationary Bike: Nustep 10' Level 2 hills #3 Machines for Strengthening Cybex Knee Extension: 2PL 2 X15reps Cybex Knee Flexion: 3.5 PL 2 X 15reps Standing Other Standing Knee Exercises: Hip abduction and extensin BLE x10 each Other Standing Knee Exercises: side step and retro gait 2RT      Physical Therapy Assessment and Plan PT Assessment and Plan Clinical Impression Statement: Progressed sets with LE strengthening on cybex machines and added nustep for activity tolerance.  Pt able to complete balance activities without use of AD or LOB.   PT Plan: Continue to improve functional strength with needed rest breaks in order to decrease risk of relapse.  Continue to progress confidence with balance activities in safe locations.      Problem List Patient Active Problem List  Diagnosis  . Multiple sclerosis exacerbation  . Fever  . HTN (hypertension)  . COPD (chronic obstructive pulmonary disease)  . Chronic pain  . Polysubstance abuse  . Lower extremity pain  . Sarcoidosis  . Paraplegia  . Obstructive sleep apnea    PT - End of Session Activity Tolerance: Patient tolerated treatment well General Behavior During Session: [facility] for tasks performed Cognition: [facility] for tasks performed   Lurena Nida, PTA/CLT 01/15/2013, 2:29 PM

## 2013-01-17 ENCOUNTER — Ambulatory Visit (HOSPITAL_COMMUNITY)
Admission: RE | Admit: 2013-01-17 | Discharge: 2013-01-17 | Disposition: A | Discharge status: Home / Self Care | Payer: Medicare Other | Source: Ambulatory Visit | Attending: Neurology | Admitting: Neurology

## 2013-01-17 NOTE — Progress Notes (Signed)
Physical Therapy Treatment Patient Details  Name: Chad Vasquez MRN: [id number] Date of Birth: [birth year]  Today's Date: 01/17/2013 Time: 1350-1445 PT Time Calculation (min): 55 min Charge: NMR 23', Therex 30'  Visit#: 5 of 8  Re-eval: 01/31/13 Assessment Diagnosis: Generalized weakness Next MD Visit: Dr. Sandria Manly -  Prior Therapy: Previous PT and OT after hospitalization in 8/13  Authorization: BCBS Medicare  Authorization Time Period:    Authorization Visit#: 5 of 10   Subjective: Symptoms/Limitations Symptoms: Pt reported he was full of energy yesterday.  No c/o pain.  Pt does c/o blurred peripheral vision, reports those symptoms come and go.   Pain Assessment Currently in Pain?: No/denies  Precautions/Restrictions  Precautions Precautions: Fall  Exercise/Treatments Stretches Active Hamstring Stretch: 1 rep;30 seconds Quad Stretch: 1 rep;30 seconds Gastroc Stretch: 1 rep;30 seconds Aerobic Stationary Bike: Nustep 10' Level 2 hills #3 SPC > 100 Machines for Strengthening Cybex Knee Extension: 2.5 Pl 2x 20 Cybex Knee Flexion: 4PL 2x 20 Standing Other Standing Knee Exercises: Tandem, retro and side stepping 2RT Other Standing Knee Exercises: High march 5x 5"  Seated Other Seated Knee Exercises: 5 STS without HHA  Physical Therapy Assessment and Plan PT Assessment and Plan Clinical Impression Statement: Overall strength and activity tolerance improving, able to increase weight on cybex and increase reps without difficulty.  Added high march in standing position for balance and hip flexoin strengthening with HHA.  Pt c/o cramps on quads, hamstrings and gastrics, pt instructed and able to verbalize stretches for thses musculature for pt to add to HEP. PT Plan: Continue to improve functional strength with needed rest breaks in order to decrease risk of relapse.  Continue to progress confidence with balance activities in safe locations.     Goals    Problem List Patient  Active Problem List  Diagnosis  . Multiple sclerosis exacerbation  . Fever  . HTN (hypertension)  . COPD (chronic obstructive pulmonary disease)  . Chronic pain  . Polysubstance abuse  . Lower extremity pain  . Sarcoidosis  . Paraplegia  . Obstructive sleep apnea    PT - End of Session Activity Tolerance: Patient tolerated treatment well General Behavior During Therapy: WFL for tasks assessed/performed Cognition: WFL for tasks performed  GP    Juel Burrow 01/17/2013, 2:59 PM

## 2013-01-22 ENCOUNTER — Ambulatory Visit (HOSPITAL_COMMUNITY)
Admission: RE | Admit: 2013-01-22 | Discharge: 2013-01-22 | Disposition: A | Discharge status: Home / Self Care | Payer: Medicare Other | Source: Ambulatory Visit | Attending: Neurology | Admitting: Neurology

## 2013-01-22 NOTE — Progress Notes (Signed)
Physical Therapy Treatment Patient Details  Name: Chad Vasquez MRN: [id number] Date of Birth: Dec 08, 1960  Today's Date: 01/22/2013 Time: 4098-1191 PT Time Calculation (min): 42 min Charges: 56' TE Visit#: 6 of 8  Re-eval: 01/31/13    Authorization: BCBS Medicare  Authorization Time Period:    Authorization Visit#: 6 of 10   Subjective: Symptoms/Limitations Symptoms: Pt reports that he really likes the new stretch for the front of his legs and it is helping a lot.  He states that he is able to now stand up independently.  Comes in today with knees flexed in stance (from increased spasms). Pain Assessment Currently in Pain?: No/denies  Precautions/Restrictions     Exercise/Treatments Stretches Quad Stretch: 1 rep;30 seconds Aerobic Stationary Bike: Nustep 12' Level 2 hills #2 steps per minute > 100 Machines for Strengthening Cybex Knee Extension: 3 PL 2x15 reps Cybex Knee Flexion: 4.5 PL 2x 15 Supine Straight Leg Raises: Both;5 reps Other Supine Knee Exercises: Hamstring Circuit w/green ball: Bridges: 10 reps, partial roll ups 5 reps Sidelying Clams: Both 10 reps  Physical Therapy Assessment and Plan PT Assessment and Plan Clinical Impression Statement: Pt able to demonstrate independent STS during treatment today.  Pt continues to improve his functional strength and is encouraged to advance his HEP to include greater reps. Notable fatigue at end of session. PT Plan: Add core activities (partial cruches, pilates 100's, obliques, isometric ball)    Goals    Problem List Patient Active Problem List  Diagnosis  . Multiple sclerosis exacerbation  . Fever  . HTN (hypertension)  . COPD (chronic obstructive pulmonary disease)  . Chronic pain  . Polysubstance abuse  . Lower extremity pain  . Sarcoidosis  . Paraplegia  . Obstructive sleep apnea    PT - End of Session Activity Tolerance: Patient tolerated treatment well General Behavior During Therapy: WFL for  tasks assessed/performed Cognition: WFL for tasks performed  GP    Jazmyne Beauchesne 01/22/2013, 2:33 PM

## 2013-01-24 ENCOUNTER — Ambulatory Visit (HOSPITAL_COMMUNITY)
Admission: RE | Admit: 2013-01-24 | Discharge: 2013-01-24 | Disposition: A | Discharge status: Home / Self Care | Payer: Medicare Other | Source: Ambulatory Visit | Attending: Neurology | Admitting: Neurology

## 2013-01-24 NOTE — Progress Notes (Signed)
Physical Therapy Treatment Patient Details  Name: Chad Vasquez MRN: [id number] Date of Birth: 11/27/1960  Today's Date: 01/24/2013 Time: 1352-1445 PT Time Calculation (min): 53 min Charge: Therex 50'  Visit#: 7 of 8  Re-eval: 01/31/13    Authorization: BCBS Medicare  Authorization Time Period:    Authorization Visit#: 7 of 10   Subjective: Symptoms/Limitations Symptoms: Pt reported shot in Rt LE. Pain Assessment Currently in Pain?: Yes Pain Score:   7 Pain Location: Leg Pain Orientation: Right  Objective:  Exercise/Treatments Aerobic Stationary Bike: Nustep 15' Level 2 hills #3 steps per minute > 100 Machines for Strengthening Cybex Knee Extension: 3 PL 2x15 reps Cybex Knee Flexion: 4.5 PL 2x 15 Supine Straight Leg Raises: Both;2 sets;10 reps Other Supine Knee Exercises: Hamstring Circuit w/green ball: Bridges: 10 reps, partial roll ups 5 reps Other Supine Knee Exercises: 100 breathes, partial crunches 2x 10, obliques 2x 10, rectus abdominis/oblique with green theraball 5x 5"  Physical Therapy Assessment and Plan PT Assessment and Plan Clinical Impression Statement: Added core strengthening exercises with demonstration prior for proper technique, pt able to perform correctly though limited by fatigue with activity.  Pt did require constant cueing for diaphragmatic breathing.  Able to increase time on Nustep for activity tolerance with average steps per minute >111.  Pt reported decrease in Rt leg pain end of session. PT Plan: Re-eval next session.    Goals    Problem List Patient Active Problem List  Diagnosis  . Multiple sclerosis exacerbation  . Fever  . HTN (hypertension)  . COPD (chronic obstructive pulmonary disease)  . Chronic pain  . Polysubstance abuse  . Lower extremity pain  . Sarcoidosis  . Paraplegia  . Obstructive sleep apnea    PT - End of Session Activity Tolerance: Patient tolerated treatment well;Patient limited by  fatigue General Behavior During Therapy: [facility] for tasks assessed/performed Cognition: WFL for tasks performed  GP    Juel Burrow 01/24/2013, 3:00 PM

## 2013-01-29 ENCOUNTER — Ambulatory Visit (HOSPITAL_COMMUNITY): Payer: Medicare Other | Admitting: Physical Therapy

## 2013-01-31 ENCOUNTER — Inpatient Hospital Stay (HOSPITAL_COMMUNITY): Admission: RE | Admit: 2013-01-31 | Payer: Medicare Other | Source: Ambulatory Visit | Admitting: Physical Therapy

## 2013-01-31 ENCOUNTER — Ambulatory Visit (INDEPENDENT_AMBULATORY_CARE_PROVIDER_SITE_OTHER): Payer: Medicare Other | Admitting: Emergency Medicine

## 2013-01-31 ENCOUNTER — Encounter: Payer: Self-pay | Admitting: Emergency Medicine

## 2013-01-31 VITALS — BP 122/70 | HR 55 | Temp 98.1°F | Ht 75.0 in | Wt 290.4 lb

## 2013-01-31 DIAGNOSIS — J449 Chronic obstructive pulmonary disease, unspecified: Secondary | ICD-10-CM

## 2013-01-31 DIAGNOSIS — D869 Sarcoidosis, unspecified: Secondary | ICD-10-CM

## 2013-01-31 DIAGNOSIS — G4733 Obstructive sleep apnea (adult) (pediatric): Secondary | ICD-10-CM

## 2013-01-31 MED ORDER — ALBUTEROL SULFATE HFA 108 (90 BASE) MCG/ACT IN AERS: 2.0000 | INHALATION_SPRAY | Freq: Four times a day (QID) | RESPIRATORY_TRACT | Status: DC | PRN

## 2013-01-31 NOTE — Patient Instructions (Addendum)
Continue your albuterol as needed for shortness of breath We can revisit whether you would benefit from a scheduled inhaler medication at a future visit.  Continue your prednisone as directed by Dr Cherlyn Cushing your CPAP every night Follow with Dr Delton Coombes in 6 months or sooner if you have any problems

## 2013-01-31 NOTE — Assessment & Plan Note (Signed)
-   stable; follow CXR and CT scan intermittently

## 2013-01-31 NOTE — Assessment & Plan Note (Signed)
-   will defer scheduled meds at this time, although I sus[ect that he will require at some point. Will revisit - albuterol prn

## 2013-01-31 NOTE — Progress Notes (Signed)
Subjective:    Patient ID: Chad Vasquez, male    DOB: [birth year], 52 y.o.   MRN: [id number]  HPI 52 yo man, former smoker (40pk-yrs), hx of MS, OSA not on CPAP, sarcoidosis dx by mediastinal LAD bx in 9/13 by mediastinoscopy. He had negative quant gold, no evidence lymphoma. His primary sx associated with sarcoidosis has been fever. Some dyspnea, but this is actually better since he stopped smoking in 7/13.  Some occas wheeze, some cough in the am - better. He is currently on pred 10mg , weaning w Dr Dierdre Forth.   ROV 11/29/12 -- follow up for dyspnea, wheeze in setting sarcoidosis, tobacco hx. 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 He is followed by Dr Sandria Manly for MS vs neuro sarcoid, saw him yesterday and believes he has principally MS. Steroids are weaned to 5mg  daily, being managed by Dr Dierdre Forth. He started CPAP this month - compliance good, seems to be tolerating. He may feel a bit better during the day.   ROV 01/31/13 -- hx sarcoidosis (last CT scan was 8/'13), MS, OSA now on CPAP - gave his download chip to Dr Tresa Endo 3/29, compliance is good. He feels better, less fatigued. He remains on pred 5mg , being weaned by Dr Dierdre Forth. He started albuterol prn for mild AFL. Believes he is doing well and does not need scheduled meds right now.    Review of Systems  Constitutional: Positive for activity change and appetite change. Negative for fever and unexpected weight change.  HENT: Positive for congestion and trouble swallowing. Negative for ear pain, nosebleeds, sore throat, rhinorrhea, sneezing, dental problem, postnasal drip and sinus pressure.   Eyes: Negative for redness and itching.  Respiratory: Positive for shortness of breath and wheezing. Negative for cough and chest tightness.   Cardiovascular: Positive for palpitations. Negative for leg swelling.  Gastrointestinal: Negative for nausea and vomiting.  Genitourinary: Negative for dysuria.   Musculoskeletal: Positive for arthralgias. Negative for joint swelling.  Skin: Negative for rash.  Neurological: Positive for dizziness, light-headedness and headaches.  Hematological: Does not bruise/bleed easily.  Psychiatric/Behavioral: Negative for dysphoric mood. The patient is nervous/anxious.         Objective:   Physical Exam Filed Vitals:   01/31/13 1517  BP: 122/70  Pulse: 55  Temp: 98.1 F (36.7 C)   Gen: Pleasant, well-nourished, in no distress,  normal affect, in wheelchair  ENT: No lesions,  mouth clear,  oropharynx clear, no postnasal drip  Neck: No JVD, no TMG, no carotid bruits  Lungs: No use of accessory muscles, no dullness to percussion, clear without rales or rhonchi  Cardiovascular: RRR, heart sounds normal, no murmur or gallops, 1+ peripheral edema  Musculoskeletal: No deformities, no cyanosis or clubbing  Neuro: alert, in wheelchair, weakness R LE although able to stand, moves all ext  Skin: Warm, no lesions or rashes   CT Chest 05/12/12 --  Comparison: Multiple exams, including 05/07/2012  CT CHEST  Findings: Right paratracheal node measures 1.3 cm in short axis,  image 17 of series 3. An AP window lymph node measures 1.6 cm in  short axis, image 24 of series 3. Right hilar adenopathy noted  with one right hilar node measuring 1.5 cm in short axis, image 32  of series 3. Left hilar adenopathy noted with several small  calcifications, and also scattered infrahilar nodes. Conglomerate  subcarinal adenopathy measures 2.2 cm in short axis, image 30 of  series 3.  Scattered small paraesophageal lymph nodes are present  in the lower thorax.  Heart size is within normal limits. A small hiatal hernia is  present.  A 4 mm densely calcified right upper lobe pulmonary nodule on image  14 of series 4 is compatible with calcified granuloma.  Centrilobular emphysema noted. There is mild atelectasis or  scarring in the posterior basal segments of both  lower lobes.  Several additional small calcified right upper lobe granulomas are  present. There are several small calcified granulomas in the left  lung.  IMPRESSION:  1. Pathologic mediastinal and hilar adenopathy. No airway  thickening or pleural nodularity to suggest pulmonary parenchymal  findings of sarcoidosis. The appearance could reflect lymphoma.  Tissue diagnosis is likely warranted.  2. Small hiatal hernia.  3. Old granulomatous disease.      Assessment & Plan:  COPD (chronic obstructive pulmonary disease) - will defer scheduled meds at this time, although I sus[ect that he will require at some point. Will revisit - albuterol prn  Sarcoidosis - stable; follow CXR and CT scan intermittently   Obstructive sleep apnea - continue CPAP

## 2013-01-31 NOTE — Assessment & Plan Note (Signed)
--  continue CPAP

## 2013-02-05 ENCOUNTER — Ambulatory Visit (HOSPITAL_COMMUNITY)
Admission: RE | Admit: 2013-02-05 | Discharge: 2013-02-05 | Disposition: A | Discharge status: Home / Self Care | Payer: Medicare Other | Source: Ambulatory Visit | Attending: Neurology | Admitting: Neurology

## 2013-02-05 DIAGNOSIS — M6281 Muscle weakness (generalized): Secondary | ICD-10-CM | POA: Insufficient documentation

## 2013-02-05 DIAGNOSIS — IMO0001 Reserved for inherently not codable concepts without codable children: Secondary | ICD-10-CM | POA: Insufficient documentation

## 2013-02-05 DIAGNOSIS — R262 Difficulty in walking, not elsewhere classified: Secondary | ICD-10-CM | POA: Insufficient documentation

## 2013-02-05 DIAGNOSIS — I10 Essential (primary) hypertension: Secondary | ICD-10-CM | POA: Insufficient documentation

## 2013-02-05 NOTE — Evaluation (Addendum)
Physical Therapy Re-Evaluation  Patient Details  Name: Chad Vasquez MRN: [id number] Date of Birth: 31-Dec-1960  Today's Date: 02/05/2013 Time: 1302-1345 PT Time Calculation (min): 43 min Charges: 76' TE             Visit#: 8 of 16  Re-eval: 03/07/13 Assessment Diagnosis: Generalized weakness Next MD Visit: Dr. 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 Asper has attended 8 OP PT visits in 4 weeks to address LE weaknes secondary to MS relapse with the following findings: Continues to improve his overall functional strength with improved sit to stands without UE Assist, independent with his HEP, has most difficulty with independent gait and speed.  Pt will benefit from skilled therapeutic intervention in order to improve on the following deficits: Abnormal gait;Decreased coordination;Pain;Decreased activity tolerance;Decreased strength Rehab Potential: Good PT Frequency: Min 2X/week PT Duration: 4 weeks PT Plan: Complete 2 minute walk test with SPC, berg balance test and DGI next visit.  Continue to improve overall strength and function.     Goals Home Exercise Program Pt will Perform Home  Exercise Program: Independently PT Short Term Goals Time to Complete Short Term Goals: 3 weeks PT Short Term Goal 1: Pt will improve his LE strength in order to begin ambulating with a SPC for 5 minutes.  PT Short Term Goal 1 - Progress: Progressing toward goal PT Short Term Goal 2: Pt will improve his gait speed and ambulate 300 feet in 2 minutes w/LRAD for improved community ambulation.  PT Short Term Goal 2 - Progress: Progressing toward goal PT Long Term Goals Time to Complete Long Term Goals:  (6 weeks) PT Long Term Goal 1: Pt will improve his dynamic balance and score greater than a 45 on the  BBS and 20 on the DGI PT Long Term Goal 1 - Progress: Progressing toward goal PT Long Term Goal 2: Pt will improve his BLE functional strenth to [facility] in order to ambulate 20 minutes with a SPC for improved community mobility.  PT Long Term Goal 2 - Progress: Progressing toward goal Long Term Goal 3: Pt will improve his ABC to greater than 75% for improved percieved functional ability in to community to be able to attend a spring fair.  Long Term Goal 3 Progress: Progressing toward goal  Problem List Patient Active Problem List   Diagnosis Date Noted  . Obstructive sleep apnea 11/29/2012  . Sarcoidosis 05/19/2012  . Paraplegia 05/19/2012  . Multiple sclerosis exacerbation 05/07/2012  . Fever 05/07/2012  . HTN (hypertension) 05/07/2012  . COPD (chronic obstructive pulmonary disease) 05/07/2012  . Chronic pain 05/07/2012  . Polysubstance abuse 05/07/2012  . Lower extremity pain 05/07/2012    PT -  End of Session Activity Tolerance: Patient tolerated treatment well;Patient limited by fatigue General Behavior During Therapy: WFL for tasks assessed/performed Cognition: WFL for tasks performed PT Plan of Care Consulted and Agree with Plan of Care: Patient  Annett Fabian, MPT, ATC 02/05/2013, 6:00 PM  Physician Documentation Your signature is required to indicate approval of the treatment  plan as stated above.  Please sign and either send electronically or make a copy of this report for your files and return this physician signed original.   Please mark one 1.__approve of plan  2. ___approve of plan with the following conditions.   ______________________________                                                          _____________________ Physician Signature                                                                                                             Date

## 2013-02-07 ENCOUNTER — Ambulatory Visit (HOSPITAL_COMMUNITY)
Admission: RE | Admit: 2013-02-07 | Discharge: 2013-02-07 | Disposition: A | Discharge status: Home / Self Care | Payer: Medicare Other | Source: Ambulatory Visit | Attending: Neurology | Admitting: Neurology

## 2013-02-07 NOTE — Progress Notes (Signed)
Physical Therapy Treatment Patient Details  Name: Chad Vasquez MRN: [id number] Date of Birth: 1961/03/05  Today's Date: 02/07/2013 Time: 5621-3086 PT Time Calculation (min): 39 min Charges: 37' TE Visit#: 9 of 16  Re-eval: 03/07/13    Authorization: [employer] Medicare  Authorization Time Period:    Authorization Visit#: 9 of 10   Subjective: Symptoms/Limitations Symptoms: Pt reports that he is having a hard day today.  Discussed about [employer], he states he has talked to his doctor in the past about going to a pool, but found that the pools would be to far away.   Precautions/Restrictions     Exercise/Treatments Aerobic Stationary Bike: Nustep 15' Level 5 hills #3 steps per minute > 100 Machines for Strengthening Cybex Knee Extension: 5 PL 20 reps Cybex Knee Flexion: 6 PL 20 reps Standing Other Standing Knee Exercises: Numbers 1-15 on foam w/BUE   Physical Therapy Assessment and Plan PT Assessment and Plan Clinical Impression Statement: Pt able to complete activities today without increased pain and has reports of improved endurance at end of session, however LLE has some numbness with prolonged standing.  Pt will benefit from skilled therapeutic intervention in order to improve on the following deficits: Abnormal gait;Decreased coordination;Pain;Decreased activity tolerance;Decreased strength Rehab Potential: Good PT Frequency: Min 2X/week PT Duration: 4 weeks PT Plan: Complete 2 minute walk test with SPC, berg balance test and DGI next visit.  Continue to improve overall strength and function.     Goals    Problem List Patient Active Problem List   Diagnosis Date Noted  . Obstructive sleep apnea 11/29/2012  . Sarcoidosis 05/19/2012  . Paraplegia 05/19/2012  . Multiple sclerosis exacerbation 05/07/2012  . Fever 05/07/2012  . HTN (hypertension) 05/07/2012  . COPD (chronic obstructive pulmonary disease) 05/07/2012  . Chronic pain 05/07/2012  . Polysubstance abuse  05/07/2012  . Lower extremity pain 05/07/2012    PT - End of Session Activity Tolerance: Patient tolerated treatment well;Patient limited by fatigue General Behavior During Therapy: [facility] for tasks assessed/performed Cognition: WFL for tasks performed PT Plan of Care Consulted and Agree with Plan of Care: Patient  GP    Sagrario Lineberry, MPT, ATC 02/07/2013, 1:43 PM

## 2013-02-12 ENCOUNTER — Ambulatory Visit (HOSPITAL_COMMUNITY)
Admission: RE | Admit: 2013-02-12 | Discharge: 2013-02-12 | Disposition: A | Discharge status: Home / Self Care | Payer: Medicare Other | Source: Ambulatory Visit | Attending: Neurology | Admitting: Neurology

## 2013-02-12 NOTE — Progress Notes (Signed)
Physical Therapy Treatment/G-code Update Patient Details  Name: Chad Vasquez MRN: [id number] Date of Birth: 20-Jan-1961  Today's Date: 02/12/2013 Time: 4098-1191 PT Time Calculation (min): 50 min Charges: 63' TE, 10' Self Care Visit#: 10 of 16  Re-eval: 03/07/13    Authorization: BCBS Medicare  Authorization Time Period:    Authorization Visit#: 10 of 20   Subjective: Symptoms/Limitations Symptoms: Pt reports that he was trying to widen the doors and fix the bathroom which took him all day.  reports he is taking 10mg  of the baclofen which helped the spasticity. Reports more joint pain to his BUE and to his RLE.    Precautions/Restrictions     Exercise/Treatments Mobility/Balance        Stretches   Aerobic Stationary Bike: Nustep 15' Level 5 hills #4 steps, Reistance 5 per minute > 100 Machines for Strengthening Cybex Knee Extension: 4 PL 2x20 Cybex Knee Flexion: 5 Pl 2x20 Plyometrics   Standing Lateral Step Up: Both;15 reps;Hand Hold: 2 Forward Step Up: Both;15 reps;Hand Hold: 2;Step Height: 4" Rocker Board: 3 minutes;Limitations Rocker Board Limitations: min A Gait Training: w/SPC x5 minutes  Seated   Supine   Sidelying   Prone         Physical Therapy Assessment and Plan PT Assessment and Plan Clinical Impression Statement: continued to progress reps and strength today to improve activity tolerance.  Pt continues to complete ADL's in which he has not been able to complete in a few months due to recent episode.  Pt request records be sent to primary MD, faxed MD notes. Pt filled out ABC and discussed.  Pt will benefit from skilled therapeutic intervention in order to improve on the following deficits: Abnormal gait;Decreased coordination;Pain;Decreased activity tolerance;Decreased strength Rehab Potential: Good PT Frequency: Min 2X/week PT Duration: 4 weeks PT Plan: Complete 2 minute walk test with SPC, berg balance test and DGI next visit.  Continue to  improve overall strength and function.     Goals Home Exercise Program Pt will Perform Home Exercise Program: Independently PT Goal: Perform Home Exercise Program - Progress: Met PT Short Term Goals Time to Complete Short Term Goals: 3 weeks PT Short Term Goal 1: Pt will improve his LE strength in order to begin ambulating with a SPC for 5 minutes.  PT Short Term Goal 1 - Progress: Met PT Short Term Goal 2: Pt will improve his gait speed and ambulate 300 feet in 2 minutes w/LRAD for improved community ambulation.  PT Short Term Goal 2 - Progress: Progressing toward goal PT Long Term Goals Time to Complete Long Term Goals:  (6 weeks) PT Long Term Goal 1: Pt will improve his dynamic balance and score greater than a 45 on the  BBS and 20 on the DGI PT Long Term Goal 1 - Progress: Progressing toward goal PT Long Term Goal 2: Pt will improve his BLE functional strenth to [facility] in order to ambulate 20 minutes with a SPC for improved community mobility.  PT Long Term Goal 2 - Progress: Progressing toward goal Long Term Goal 3: Pt will improve his ABC to greater than 75% for improved percieved functional ability in to community to be able to attend a spring fair.  Long Term Goal 3 Progress: Progressing toward goal  Problem List Patient Active Problem List   Diagnosis Date Noted  . Obstructive sleep apnea 11/29/2012  . Sarcoidosis 05/19/2012  . Paraplegia 05/19/2012  . Multiple sclerosis exacerbation 05/07/2012  . Fever 05/07/2012  . HTN (hypertension)  05/07/2012  . COPD (chronic obstructive pulmonary disease) 05/07/2012  . Chronic pain 05/07/2012  . Polysubstance abuse 05/07/2012  . Lower extremity pain 05/07/2012    PT - End of Session Activity Tolerance: Patient tolerated treatment well;Patient limited by fatigue General Behavior During Therapy: [facility] for tasks assessed/performed Cognition: WFL for tasks performed PT Plan of Care PT Patient Instructions: Discussed ABC and current  progress Consulted and Agree with Plan of Care: Patient  GP Functional Assessment Tool Used: ABC: 24% (ambulating without AD) and clinical observation  Functional Limitation: Mobility: Walking and moving around Mobility: Walking and Moving Around Current Status [phone]): At least 60 percent but less than 80 percent impaired, limited or restricted Mobility: Walking and Moving Around Goal Status [phone]): At least 20 percent but less than 40 percent impaired, limited or restricted  Justine Dines, MPT, ATC 02/12/2013, 3:14 PM

## 2013-02-14 ENCOUNTER — Ambulatory Visit (HOSPITAL_COMMUNITY)
Admission: RE | Admit: 2013-02-14 | Discharge: 2013-02-14 | Disposition: A | Discharge status: Home / Self Care | Payer: Medicare Other | Source: Ambulatory Visit | Attending: Family Medicine | Admitting: Family Medicine

## 2013-02-14 NOTE — Progress Notes (Signed)
Physical Therapy Treatment Patient Details  Name: Chad Vasquez MRN: [id number] Date of Birth: 18-May-1961  Today's Date: 02/14/2013 Time: 1302-1400 PT Time Calculation (min): 58 min Charge: Physical performance testing x 38', Therex x 20'  Visit#: 11 of 16  Re-eval: 03/07/13 Assessment Diagnosis: Generalized weakness Next MD Visit: Dr. Sandria Manly -  Prior Therapy: Previous PT and OT after hospitalization in 8/13  Authorization: BC[employer] Medicare  Authorization Time Period: Gcode complete 10th visit  Authorization Visit#: 11 of 20   Subjective: Symptoms/Limitations Symptoms: Pt reported Bil LE MS pain scale 8/10. Pain Assessment Currently in Pain?: Yes Pain Score:   8 Pain Location: Leg Pain Orientation: Right;Left  Precautions/Restrictions  Precautions Precautions: Fall  Exercise/Treatments Mobility/Balance  Ambulation/Gait Ambulation/Gait: Yes Ambulation/Gait Assistance: 6: Modified independent (Device/Increase time) (SPC) Ambulation Distance (Feet): 163 Feet (2 minutes) Assistive device: Straight cane Gait Pattern: Decreased trunk rotation;Trunk flexed;Decreased hip/knee flexion - right;Decreased hip/knee flexion - left;Ataxic  Berg Balance Test Sit to Stand: Able to stand without using hands and stabilize independently Standing Unsupported: Able to stand safely 2 minutes Sitting with Back Unsupported but Feet Supported on Floor or Stool: Able to sit safely and securely 2 minutes Stand to Sit: Sits safely with minimal use of hands Transfers: Able to transfer safely, minor use of hands Standing Unsupported with Eyes Closed: Able to stand 10 seconds safely Standing Ubsupported with Feet Together: Able to place feet together independently and stand for 1 minute with supervision From Standing, Reach Forward with Outstretched Arm: Can reach forward >12 cm safely (5") From Standing Position, Pick up Object from Floor: Able to pick up shoe safely and easily From Standing  Position, Turn to Look Behind Over each Shoulder: Looks behind one side only/other side shows less weight shift Turn 360 Degrees: Able to turn 360 degrees safely but slowly Standing Unsupported, Alternately Place Feet on Step/Stool: Able to complete >2 steps/needs minimal assist Standing Unsupported, One Foot in Front: Needs help to step but can hold 15 seconds Standing on One Leg: Unable to try or needs assist to prevent fall Total Score: 41 Dynamic Gait Index Level Surface: Mild Impairment Change in Gait Speed: Mild Impairment Gait with Horizontal Head Turns: Moderate Impairment Gait with Vertical Head Turns: Moderate Impairment Gait and Pivot Turn: Mild Impairment Step Over Obstacle: Mild Impairment Step Around Obstacles: Moderate Impairment Steps: Mild Impairment Total Score: 13 Timed Up and Go Test TUG: Normal TUG Normal TUG (seconds): 20.4 (with SPC)  Aerobic Stationary Bike: Nustep 15' Level 5 hills #4 steps; SPM 113 .93 Machines for Strengthening Cybex Knee Extension: 4.5 PL 2x20 Cybex Knee Flexion: 5.5 Pl 2x20      Physical Therapy Assessment and Plan PT Assessment and Plan Clinical Impression Statement: Pt progressing well with overall LE strengthening, balance and gait mechanics.  Physical performance testing complete today with imporved score on BERG, increased distance with least restrictive AD and improved dynamic gait index.  Pt limited by fatigue end of session, no report of increased pain.   PT Plan: Continue to improve functional strength, balance and gait.    Goals    Problem List Patient Active Problem List   Diagnosis Date Noted  . Obstructive sleep apnea 11/29/2012  . Sarcoidosis 05/19/2012  . Paraplegia 05/19/2012  . Multiple sclerosis exacerbation 05/07/2012  . Fever 05/07/2012  . HTN (hypertension) 05/07/2012  . COPD (chronic obstructive pulmonary disease) 05/07/2012  . Chronic pain 05/07/2012  . Polysubstance abuse 05/07/2012  . Lower  extremity pain 05/07/2012  PT - End of Session Equipment Utilized During Treatment: Gait belt Activity Tolerance: Patient tolerated treatment well;Patient limited by fatigue General Behavior During Therapy: [facility] for tasks assessed/performed Cognition: WFL for tasks performed  GP    Juel Burrow 02/14/2013, 2:01 PM

## 2013-03-02 ENCOUNTER — Telehealth: Payer: Self-pay | Admitting: Neurology

## 2013-03-02 NOTE — Telephone Encounter (Signed)
Sent a staff message to Philipp Ovens to schedule the appointment with Dr. Marjory Lies since that's who Dr. Sandria Manly assigned him to.

## 2013-04-11 ENCOUNTER — Telehealth: Payer: Self-pay | Admitting: *Deleted

## 2013-04-11 NOTE — Telephone Encounter (Signed)
Faxed CPAP supply order sheet back. 

## 2013-04-15 ENCOUNTER — Inpatient Hospital Stay (HOSPITAL_COMMUNITY)
Admission: EM | Admit: 2013-04-15 | Discharge: 2013-04-17 | DRG: 419 | Disposition: A | Discharge status: Home / Self Care | Payer: Medicare Other | Attending: General Surgery | Admitting: General Surgery

## 2013-04-15 ENCOUNTER — Emergency Department (HOSPITAL_COMMUNITY): Payer: Medicare Other

## 2013-04-15 ENCOUNTER — Encounter (HOSPITAL_COMMUNITY): Payer: Self-pay

## 2013-04-15 DIAGNOSIS — K8020 Calculus of gallbladder without cholecystitis without obstruction: Secondary | ICD-10-CM

## 2013-04-15 DIAGNOSIS — Z79899 Other long term (current) drug therapy: Secondary | ICD-10-CM

## 2013-04-15 DIAGNOSIS — D869 Sarcoidosis, unspecified: Secondary | ICD-10-CM | POA: Diagnosis present

## 2013-04-15 DIAGNOSIS — D72829 Elevated white blood cell count, unspecified: Secondary | ICD-10-CM | POA: Diagnosis present

## 2013-04-15 DIAGNOSIS — G35 Multiple sclerosis: Secondary | ICD-10-CM | POA: Diagnosis present

## 2013-04-15 DIAGNOSIS — Z87891 Personal history of nicotine dependence: Secondary | ICD-10-CM

## 2013-04-15 DIAGNOSIS — Z8249 Family history of ischemic heart disease and other diseases of the circulatory system: Secondary | ICD-10-CM

## 2013-04-15 DIAGNOSIS — G4730 Sleep apnea, unspecified: Secondary | ICD-10-CM | POA: Diagnosis present

## 2013-04-15 DIAGNOSIS — K8000 Calculus of gallbladder with acute cholecystitis without obstruction: Principal | ICD-10-CM | POA: Diagnosis present

## 2013-04-15 DIAGNOSIS — IMO0002 Reserved for concepts with insufficient information to code with codable children: Secondary | ICD-10-CM

## 2013-04-15 DIAGNOSIS — Z823 Family history of stroke: Secondary | ICD-10-CM

## 2013-04-15 DIAGNOSIS — F1910 Other psychoactive substance abuse, uncomplicated: Secondary | ICD-10-CM | POA: Diagnosis present

## 2013-04-15 DIAGNOSIS — I10 Essential (primary) hypertension: Secondary | ICD-10-CM | POA: Diagnosis present

## 2013-04-15 LAB — LACTIC ACID, PLASMA: Lactic Acid, Venous: 1.1 mmol/L (ref 0.5–2.2)

## 2013-04-15 LAB — CBC WITH DIFFERENTIAL/PLATELET
Eosinophils Relative: 1 % (ref 0–5)
Lymphocytes Relative: 16 % (ref 12–46)
Lymphs Abs: 1.7 10*3/uL (ref 0.7–4.0)
MCV: 87.1 fL (ref 78.0–100.0)
Neutrophils Relative %: 77 % (ref 43–77)
Platelets: 243 10*3/uL (ref 150–400)
RBC: 4.43 MIL/uL (ref 4.22–5.81)
WBC: 10.5 10*3/uL (ref 4.0–10.5)

## 2013-04-15 LAB — COMPREHENSIVE METABOLIC PANEL
Albumin: 4.2 g/dL (ref 3.5–5.2)
BUN: 16 mg/dL (ref 6–23)
Chloride: 105 mEq/L (ref 96–112)
Creatinine, Ser: 0.95 mg/dL (ref 0.50–1.35)
Total Bilirubin: 0.2 mg/dL — ABNORMAL LOW (ref 0.3–1.2)

## 2013-04-15 LAB — URINALYSIS, ROUTINE W REFLEX MICROSCOPIC
Glucose, UA: NEGATIVE mg/dL
Hgb urine dipstick: NEGATIVE
Leukocytes, UA: NEGATIVE
Protein, ur: NEGATIVE mg/dL
Specific Gravity, Urine: 1.02 (ref 1.005–1.030)
pH: 6.5 (ref 5.0–8.0)

## 2013-04-15 LAB — TROPONIN I: Troponin I: <0.3 ng/mL (ref <0.30)

## 2013-04-15 LAB — LIPASE, BLOOD: Lipase: 29 U/L (ref 11–59)

## 2013-04-15 MED ORDER — DIPHENHYDRAMINE HCL 12.5 MG/5ML PO ELIX: 12.5000 mg | ORAL_SOLUTION | Freq: Four times a day (QID) | ORAL | Status: DC | PRN

## 2013-04-15 MED ORDER — KCL IN DEXTROSE-NACL 20-5-0.45 MEQ/L-%-% IV SOLN
INTRAVENOUS | Status: DC
  Administered 2013-04-15: 13:00:00 via INTRAVENOUS

## 2013-04-15 MED ORDER — ONDANSETRON HCL 4 MG/2ML IJ SOLN
4.0000 mg | Freq: Once | INTRAMUSCULAR | Status: AC
  Administered 2013-04-15: 4 mg via INTRAVENOUS
  Filled 2013-04-15: qty 2

## 2013-04-15 MED ORDER — LORAZEPAM 2 MG/ML IJ SOLN
1.0000 mg | INTRAMUSCULAR | Status: DC | PRN
  Administered 2013-04-15: 1 mg via INTRAVENOUS
  Filled 2013-04-15: qty 1

## 2013-04-15 MED ORDER — PANTOPRAZOLE SODIUM 40 MG IV SOLR
40.0000 mg | Freq: Every day | INTRAVENOUS | Status: DC
  Administered 2013-04-15 – 2013-04-16 (×2): 40 mg via INTRAVENOUS
  Filled 2013-04-15 (×2): qty 40

## 2013-04-15 MED ORDER — HYDROMORPHONE HCL PF 1 MG/ML IJ SOLN
1.0000 mg | Freq: Once | INTRAMUSCULAR | Status: AC
  Administered 2013-04-15: 1 mg via INTRAVENOUS
  Filled 2013-04-15: qty 1

## 2013-04-15 MED ORDER — HYDROMORPHONE HCL PF 1 MG/ML IJ SOLN
2.0000 mg | INTRAMUSCULAR | Status: DC | PRN
  Administered 2013-04-15 – 2013-04-17 (×10): 2 mg via INTRAVENOUS
  Filled 2013-04-15 (×2): qty 2
  Filled 2013-04-15 (×2): qty 1
  Filled 2013-04-15 (×7): qty 2

## 2013-04-15 MED ORDER — FENTANYL CITRATE 0.05 MG/ML IJ SOLN
50.0000 ug | Freq: Once | INTRAMUSCULAR | Status: AC
  Administered 2013-04-15: 50 ug via INTRAVENOUS
  Filled 2013-04-15: qty 2

## 2013-04-15 MED ORDER — ACETAMINOPHEN 325 MG PO TABS
650.0000 mg | ORAL_TABLET | Freq: Four times a day (QID) | ORAL | Status: DC | PRN
  Administered 2013-04-16: 650 mg via ORAL
  Filled 2013-04-15: qty 2

## 2013-04-15 MED ORDER — ONDANSETRON HCL 4 MG/2ML IJ SOLN: 4.0000 mg | Freq: Four times a day (QID) | INTRAMUSCULAR | Status: DC | PRN

## 2013-04-15 MED ORDER — ENOXAPARIN SODIUM 40 MG/0.4ML ~~LOC~~ SOLN
40.0000 mg | SUBCUTANEOUS | Status: DC
  Administered 2013-04-15 – 2013-04-16 (×2): 40 mg via SUBCUTANEOUS
  Filled 2013-04-15: qty 0.4

## 2013-04-15 MED ORDER — DIPHENHYDRAMINE HCL 50 MG/ML IJ SOLN: 12.5000 mg | Freq: Four times a day (QID) | INTRAMUSCULAR | Status: DC | PRN

## 2013-04-15 MED ORDER — ACETAMINOPHEN 650 MG RE SUPP: 650.0000 mg | Freq: Four times a day (QID) | RECTAL | Status: DC | PRN

## 2013-04-15 MED ORDER — INSULIN ASPART 100 UNIT/ML ~~LOC~~ SOLN
0.0000 [IU] | Freq: Three times a day (TID) | SUBCUTANEOUS | Status: DC
  Administered 2013-04-15 – 2013-04-16 (×2): 3 [IU] via SUBCUTANEOUS
  Administered 2013-04-16 – 2013-04-17 (×2): 4 [IU] via SUBCUTANEOUS

## 2013-04-15 NOTE — ED Notes (Signed)
Pt's family member at door or room, shaking cup, asked if pt could have water/ice, advised no, due to chance of surgery, she stated "are you kidding", advised that md will be to see pt to update on plan of care.

## 2013-04-15 NOTE — H&P (Signed)
Chad Vasquez is an 52 y.o. male.   Chief Complaint: Right upper quadrant abdominal pain HPI: Patient is a 52 year old white male who multiple medical problems including sarcoidosis and multiple sclerosis who presents with a 24-hour history of worsening right upper quadrant abdominal pain. Presented emergency room. Ultrasound the gallbladder reveals cholelithiasis. Common bile duct is within normal limits. His CBC and liver enzyme tests are within normal limits. He is being admitted to the hospital for further evaluation and treatment. He denies any fever, chills, or jaundice.  Past Medical History  Diagnosis Date  . Multiple sclerosis 2010  . HTN (hypertension)   . Polysubstance abuse     Past Surgical History  Procedure Laterality Date  . Mediastinoscopy  05/15/2012    Procedure: MEDIASTINOSCOPY;  Surgeon: Loreli Slot, MD;  Location: [facility] OR;  Service: Thoracic;  Laterality: N/A;    Family History  Problem Relation Age of Onset  . Heart failure Mother   . Stroke Mother   . Heart failure Father   . Cancer Sister   . Seizures Brother    Social History:  reports that he quit smoking about a year ago. His smoking use included Cigarettes. He has a 30 pack-year smoking history. He has never used smokeless tobacco. He reports that he uses illicit drugs (Cocaine and Marijuana). He reports that he does not drink alcohol. He has not used any illicit drugs for over one year. Allergies: No Known Allergies  Medications Prior to Admission  Medication Sig Dispense Refill  . acetaminophen (TYLENOL) 325 MG tablet Take 2 tablets (650 mg total) by mouth every 6 (six) hours as needed for pain or fever (or Fever >/= 101).      Marland Kitchen albuterol (PROVENTIL HFA;VENTOLIN HFA) 108 (90 BASE) MCG/ACT inhaler Inhale 2 puffs into the lungs every 6 (six) hours as needed for wheezing.  1 Inhaler  6  . baclofen (LIORESAL) 10 MG tablet Take 5 mg by mouth 3 (three) times daily.       . cholecalciferol (VITAMIN  D) 1000 UNITS tablet Take 1,000 Units by mouth daily.      . diazepam (VALIUM) 5 MG tablet Take 5 mg by mouth 3 (three) times daily.      Marland Kitchen docusate sodium (COLACE) 100 MG capsule Take 100 mg by mouth daily.       Marland Kitchen glatiramer (COPAXONE) 20 MG/ML injection Inject 40 mg into the skin every 3 (three) days.       Marland Kitchen HYDROcodone-acetaminophen (NORCO) 7.5-325 MG per tablet Take 1 tablet by mouth every 6 (six) hours as needed for pain.      Marland Kitchen insulin aspart (NOVOLOG) 100 UNIT/ML injection Inject 0-15 Units into the skin 3 (three) times daily with meals.  1 vial    . losartan (COZAAR) 100 MG tablet Take 100 mg by mouth daily.      Marland Kitchen oxyCODONE-acetaminophen (PERCOCET) 7.5-500 MG per tablet Take 1 tablet by mouth every 4 (four) hours as needed for pain.      . pantoprazole (PROTONIX) 40 MG tablet Take 1 tablet (40 mg total) by mouth 2 (two) times daily before a meal.      . potassium chloride SA (K-DUR,KLOR-CON) 20 MEQ tablet Take 20 mEq by mouth daily.      . predniSONE (DELTASONE) 5 MG tablet Take 5 mg by mouth daily.        Results for orders placed during the hospital encounter of 04/15/13 (from the past 48 hour(s))  COMPREHENSIVE METABOLIC  PANEL     Status: Abnormal   Collection Time    04/15/13  7:50 AM      Result Value Range   Sodium 140  135 - 145 mEq/L   Potassium 4.0  3.5 - 5.1 mEq/L   Chloride 105  96 - 112 mEq/L   CO2 28  19 - 32 mEq/L   Glucose, Bld 149 (*) 70 - 99 mg/dL   BUN 16  6 - 23 mg/dL   Creatinine, Ser 1.19  0.50 - 1.35 mg/dL   Calcium 9.6  8.4 - 14.7 mg/dL   Total Protein 7.3  6.0 - 8.3 g/dL   Albumin 4.2  3.5 - 5.2 g/dL   AST 19  0 - 37 U/L   ALT 29  0 - 53 U/L   Alkaline Phosphatase 68  39 - 117 U/L   Total Bilirubin 0.2 (*) 0.3 - 1.2 mg/dL   GFR calc non Af Amer >90  >90 mL/min   GFR calc Af Amer >90  >90 mL/min   Comment:            The eGFR has been calculated     using the CKD EPI equation.     This calculation has not been     validated in all clinical      situations.     eGFR's persistently     <90 mL/min signify     possible Chronic Kidney Disease.  CBC WITH DIFFERENTIAL     Status: Abnormal   Collection Time    04/15/13  7:50 AM      Result Value Range   WBC 10.5  4.0 - 10.5 K/uL   RBC 4.43  4.22 - 5.81 MIL/uL   Hemoglobin 13.5  13.0 - 17.0 g/dL   HCT 82.9 (*) 56.2 - 13.0 %   MCV 87.1  78.0 - 100.0 fL   MCH 30.5  26.0 - 34.0 pg   MCHC 35.0  30.0 - 36.0 g/dL   RDW 86.5  78.4 - 69.6 %   Platelets 243  150 - 400 K/uL   Neutrophils Relative % 77  43 - 77 %   Neutro Abs 8.0 (*) 1.7 - 7.7 K/uL   Lymphocytes Relative 16  12 - 46 %   Lymphs Abs 1.7  0.7 - 4.0 K/uL   Monocytes Relative 5  3 - 12 %   Monocytes Absolute 0.6  0.1 - 1.0 K/uL   Eosinophils Relative 1  0 - 5 %   Eosinophils Absolute 0.1  0.0 - 0.7 K/uL   Basophils Relative 0  0 - 1 %   Basophils Absolute 0.0  0.0 - 0.1 K/uL  LIPASE, BLOOD     Status: None   Collection Time    04/15/13  7:50 AM      Result Value Range   Lipase 29  11 - 59 U/L  LACTIC ACID, PLASMA     Status: None   Collection Time    04/15/13  8:00 AM      Result Value Range   Lactic Acid, Venous 1.1  0.5 - 2.2 mmol/L  TROPONIN I     Status: None   Collection Time    04/15/13  8:46 AM      Result Value Range   Troponin I <0.30  <0.30 ng/mL   Comment:            Due to the release kinetics of cTnI,     a  negative result within the first hours     of the onset of symptoms does not rule out     myocardial infarction with certainty.     If myocardial infarction is still suspected,     repeat the test at appropriate intervals.  URINALYSIS, ROUTINE W REFLEX MICROSCOPIC     Status: None   Collection Time    04/15/13 11:08 AM      Result Value Range   Color, Urine YELLOW  YELLOW   APPearance CLEAR  CLEAR   Specific Gravity, Urine 1.020  1.005 - 1.030   pH 6.5  5.0 - 8.0   Glucose, UA NEGATIVE  NEGATIVE mg/dL   Hgb urine dipstick NEGATIVE  NEGATIVE   Bilirubin Urine NEGATIVE  NEGATIVE   Ketones,  ur NEGATIVE  NEGATIVE mg/dL   Protein, ur NEGATIVE  NEGATIVE mg/dL   Urobilinogen, UA 0.2  0.0 - 1.0 mg/dL   Nitrite NEGATIVE  NEGATIVE   Leukocytes, UA NEGATIVE  NEGATIVE   Comment: MICROSCOPIC NOT DONE ON URINES WITH NEGATIVE PROTEIN, BLOOD, LEUKOCYTES, NITRITE, OR GLUCOSE <1000 mg/dL.   Dg Chest Portable 1 View  04/15/2013   *RADIOLOGY REPORT*  Clinical Data: Abdominal pain  PORTABLE CHEST - 1 VIEW  Comparison: 10/23/2012  Findings: COPD with hyperinflation of the lungs.  Negative for pneumonia.  Negative for heart failure or effusion.  The lungs are clear and unchanged from the prior study.  IMPRESSION: COPD without acute abnormality.   Original Report Authenticated By: Janeece Riggers, M.D.   US Abdomen Limited Ruq  04/15/2013   *RADIOLOGY REPORT*  Clinical Data:  Right upper quadrant pain.  Nausea.  LIMITED ABDOMINAL ULTRASOUND - RIGHT UPPER QUADRANT  Comparison:  None.  Findings:  Gallbladder:  Multiple tiny gallstones are seen in the gallbladder neck.  Gallbladder is mildly distended, however there is no evidence of gallbladder wall thickening.  No sonographic Murphy's sign noted by the sonographer.  Common bile duct:  Within normal limits in caliber. Measures 6 mm in diameter.  Liver:  Diffusely increased parenchymal echogenicity, consistent with hepatic steatosis.  No focal liver mass identified.  IMPRESSION:  1.  Tiny gallstones.  No definite sonographic signs of acute cholecystitis or biliary dilatation. 2.  Hepatic steatosis.   Original Report Authenticated By: Myles Rosenthal, M.D.    Review of Systems  Respiratory: Positive for shortness of breath.   Cardiovascular: Negative.   Gastrointestinal: Positive for nausea and abdominal pain.  Genitourinary: Negative.   Musculoskeletal: Positive for myalgias.  Neurological: Positive for dizziness and weakness.    Blood pressure 178/93, pulse 71, temperature 99 F (37.2 C), temperature source Oral, resp. rate 20, height 6\' 3"  (1.905 m), weight  129.729 kg (286 lb), SpO2 93.00%. Physical Exam  Constitutional: He is oriented to person, place, and time. He appears well-developed and well-nourished. He appears distressed.  HENT:  Head: Normocephalic and atraumatic.  Eyes: No scleral icterus.  Neck: Normal range of motion. Neck supple.  Old tracheotomy scar present  Cardiovascular: Normal rate, regular rhythm and normal heart sounds.   Respiratory: Effort normal and breath sounds normal. No respiratory distress. He has no wheezes.  GI: Soft. He exhibits no distension. There is tenderness. There is no rebound.  Tender in the right upper quadrant to deep palpation. No rigidity noted.  Neurological: He is alert and oriented to person, place, and time.  Skin: Skin is warm and dry.     Assessment/Plan Impression: Biliary colic, cholelithiasis Multiple sclerosis, sarcoidosis, hypertension, steroid  usage Plan: We'll get the patient to the hospital for further evaluation and treatment. Will start IV fluids. Will subsequently undergo a laparoscopic cholecystectomy. He is at higher risk for general anesthesia, thus he may need to be placed in the ICU postoperatively do to his multiple pulmonary and musculoskeletal problems. The risks and benefits of the procedure including bleeding, infection, hepatobiliary injury, cardiopulmonary difficulties, the possibility of difficulty in extubation, and a possibly of an open procedure were fully explained to the patient, who gave informed consent.  Tyasia Packard A 04/15/2013, 12:00 PM

## 2013-04-15 NOTE — ED Notes (Signed)
Pt's family arrived, updated on plan of care, upset over delay in ultrasound until 9am, explained that they were being called in for exam.

## 2013-04-15 NOTE — ED Notes (Signed)
Pt moaning in bed in pain, family out to desk x2 requesting meds for pain, dr. Bebe Shaggy notified.  Stated will call the surgeon.  No additional meds ordered, family and pt, notified. Shook head and pulled curtain closed.

## 2013-04-15 NOTE — ED Notes (Signed)
Pt crying in bed, additional meds given for pain, family at bedside, wiping pt's brow. Pt moaning in pain, after meds given, pt calm and eyes closed.  Family member stated that last time he was here "they were never able to get his pain under control", dr. Bebe Shaggy notified.

## 2013-04-15 NOTE — ED Provider Notes (Signed)
History    This chart was scribed for Joya Gaskins, MD by Leone Payor, ED Scribe. This patient was seen in room [facility]18[facility] and the patient's care was started 7:19 AM.  CSN: [id number] Arrival date & time 04/15/13  1478  First MD Initiated Contact with Patient 04/15/13 0701     Chief Complaint  Patient presents with  . Abdominal Pain    Patient is a 52 y.o. male presenting with abdominal pain. The history is provided by the patient. No language interpreter was used.  Abdominal Pain This is a new problem. The current episode started yesterday. The problem occurs constantly. The problem has been gradually worsening. Associated symptoms include abdominal pain. Pertinent negatives include no chest pain and no shortness of breath. Nothing aggravates the symptoms. Nothing relieves the symptoms. He has tried nothing for the symptoms.    HPI Comments: Chad Vasquez is a 52 y.o. male with past medical history of MS and HTN who presents to the Emergency Department complaining of 1 day of gradual onset, gradually worsening, constant epigastric abdominal pain. States the pain became unbearable at 2 am this morning causing him to wake up from sleep. He states the pain radiates into his back. He denies any similar pain before. Pt states he has some weakness in bilateral lower extremities but states he has this at baseline. He has associated nausea. Pt reports taking prednisone regularly. He denies fever, vomiting, chest pain, diarrhea, bloody or black stools, vomiting blood, cough, SOB, lower abdominal pain.   Pt is a former smoker but denies alcohol use.  Past Medical History  Diagnosis Date  . Multiple sclerosis 2010  . HTN (hypertension)   . Polysubstance abuse    Past Surgical History  Procedure Laterality Date  . Mediastinoscopy  05/15/2012    Procedure: MEDIASTINOSCOPY;  Surgeon: Loreli Slot, MD;  Location: [facility] OR;  Service: Thoracic;  Laterality: N/A;   Family History   Problem Relation Age of Onset  . Heart failure Mother   . Stroke Mother   . Heart failure Father   . Cancer Sister   . Seizures Brother    History  Substance Use Topics  . Smoking status: Former Smoker -- 1.00 packs/day for 30 years    Types: Cigarettes    Quit date: 05/01/2012  . Smokeless tobacco: Never Used  . Alcohol Use: No     Comment: quit 05/01/2012    Review of Systems  Constitutional: Negative for fever.  Respiratory: Negative for cough and shortness of breath.   Cardiovascular: Negative for chest pain.  Gastrointestinal: Positive for nausea and abdominal pain. Negative for vomiting, diarrhea and blood in stool.  Musculoskeletal: Positive for back pain.  Neurological: Positive for weakness.  All other systems reviewed and are negative.    Allergies  Review of patient's allergies indicates no known allergies.  Home Medications   Current Outpatient Rx  Name  Route  Sig  Dispense  Refill  . acetaminophen (TYLENOL) 325 MG tablet   Oral   Take 2 tablets (650 mg total) by mouth every 6 (six) hours as needed for pain or fever (or Fever >/= 101).         Marland Kitchen albuterol (PROVENTIL HFA;VENTOLIN HFA) 108 (90 BASE) MCG/ACT inhaler   Inhalation   Inhale 2 puffs into the lungs every 6 (six) hours as needed for wheezing.   1 Inhaler   6   . baclofen (LIORESAL) 10 MG tablet   Oral   Take  10 mg by mouth 3 (three) times daily.         . Cholecalciferol (VITAMIN D) 2000 UNITS tablet   Oral   Take 2,000 Units by mouth every morning.         . diazepam (VALIUM) 5 MG tablet   Oral   Take 5 mg by mouth 3 (three) times daily.         Marland Kitchen docusate sodium (COLACE) 100 MG capsule   Oral   Take 100 mg by mouth 2 (two) times daily.         . furosemide (LASIX) 20 MG tablet   Oral   Take 20 mg by mouth daily.         Marland Kitchen glatiramer (COPAXONE) 20 MG/ML injection   Subcutaneous   Inject 40 mg into the skin every 3 (three) days.          Marland Kitchen  HYDROcodone-acetaminophen (VICODIN ES) 7.5-750 MG per tablet   Oral   Take 1 tablet by mouth every 6 (six) hours as needed for pain. For pain   30 tablet   0   . insulin aspart (NOVOLOG) 100 UNIT/ML injection   Subcutaneous   Inject 0-15 Units into the skin 3 (three) times daily with meals.   1 vial      . losartan (COZAAR) 100 MG tablet   Oral   Take 100 mg by mouth daily.         . pantoprazole (PROTONIX) 40 MG tablet   Oral   Take 1 tablet (40 mg total) by mouth 2 (two) times daily before a meal.         . polyethylene glycol (MIRALAX / GLYCOLAX) packet   Oral   Take 17 g by mouth daily.         . potassium chloride SA (K-DUR,KLOR-CON) 20 MEQ tablet   Oral   Take 20 mEq by mouth daily.         . prednisoLONE 5 MG TABS   Oral   Take 5 mg by mouth. 10 mg daily         . Probiotic Product (ALIGN) 4 MG CAPS   Oral   Take 1 tablet by mouth daily.          BP 156/119  Pulse 60  Temp(Src) 99 F (37.2 C) (Oral)  Resp 22  Ht 6\' 3"  (1.905 m)  Wt 286 lb (129.729 kg)  BMI 35.75 kg/m2  SpO2 92% Physical Exam  CONSTITUTIONAL: Well developed/well nourished HEAD: Normocephalic/atraumatic EYES: EOMI/PERRL, no icterus.  ENMT: Mucous membranes dry.  NECK: supple no meningeal signs SPINE:entire spine nontender CV: S1/S2 noted, no murmurs/rubs/gallops noted LUNGS: Lungs are clear to auscultation bilaterally, no apparent distress ABDOMEN: soft, significant tenderness to epigastric and RUQ, no rebound or guarding GU:no cva tenderness NEURO: Pt is awake/alert, moves all extremitiesx4 EXTREMITIES: pulses normal, full ROM SKIN: warm, color normal PSYCH: no abnormalities of mood noted  ED Course  Procedures   DIAGNOSTIC STUDIES: Oxygen Saturation is 92% on RA, low by my interpretation.    COORDINATION OF CARE: 7:26 AM Discussed treatment plan with pt at bedside and pt agreed to plan.  Pt with significant RUQ/epigastric tenderness I have arranged [place], which  will take place at approximately 9am 8:54 AM Pt with continued pain, [place] pending, will redose pain meds Still with tenderness in RUQ, but rest of abdomen soft CXR also ordered (pt had mild hypoxia earlier that resolved) Labs Reviewed  COMPREHENSIVE METABOLIC PANEL  CBC WITH DIFFERENTIAL  LIPASE, BLOOD  LACTIC ACID, PLASMA   10:38 AM [place] studies d/w dr Lovell Sheehan He will admit due to intractable pain Patient/family updated on plan   MDM  Nursing notes including past medical history and social history reviewed and considered in documentation Labs/vital reviewed and considered       Date: 04/15/2013 0715  Rate: 55  Rhythm: normal sinus rhythm, bradycardia  QRS Axis: normal  Intervals: normal  ST/T Wave abnormalities: nonspecific ST changes  Conduction Disutrbances:none  Narrative Interpretation:   Old EKG Reviewed: changes noted rate is slower today   Date: 04/15/2013 0856  Rate: 68  Rhythm: sinus bradycardia  QRS Axis: normal  Intervals: normal  ST/T Wave abnormalities: nonspecific ST changes  Conduction Disutrbances:none  Narrative Interpretation:   Old EKG Reviewed: unchanged Significant artifact but no obvious acute ST changes noted on repeat EKG     I personally performed the services described in this documentation, which was scribed in my presence. The recorded information has been reviewed and is accurate.          Joya Gaskins, MD 04/15/13 1038

## 2013-04-15 NOTE — ED Notes (Signed)
Attempted to call report, nurse did not know they were getting a pt. Will return the call

## 2013-04-15 NOTE — ED Notes (Signed)
Pt given safety socks per wife request, wife given a cup of ice water per request. Reminded of npo status for the pt, nodded in understanding.

## 2013-04-15 NOTE — ED Notes (Signed)
Pt notified of npo status, given mouth swabs.

## 2013-04-15 NOTE — ED Notes (Signed)
Pt given meds for pain, pain eased immediately during adminstration of meds.  Wife at bedside. Dr. Bebe Shaggy to bedside.

## 2013-04-15 NOTE — ED Notes (Signed)
Pt arrived from home by ems cc right upper quad ab pain. No other s/s.

## 2013-04-15 NOTE — ED Notes (Signed)
Pt sleeping at present, normal rise and fall of chest. Family at bedside.

## 2013-04-15 NOTE — ED Notes (Signed)
Dr.wickline notified of pt's current vs, no additional orders obtained. Pt to floor.

## 2013-04-15 NOTE — ED Notes (Signed)
Pt's heart rate 52-56, will hold dilaudid at this time.   Rates pain 7/10.  At this time.

## 2013-04-15 NOTE — ED Notes (Addendum)
Cup ice given to wife as she was leaving the department. Requested information about how often pt was to be given meds for pain. Stated i was unsure would be up to admitting md.

## 2013-04-16 ENCOUNTER — Encounter (HOSPITAL_COMMUNITY): Payer: Self-pay | Admitting: *Deleted

## 2013-04-16 ENCOUNTER — Inpatient Hospital Stay (HOSPITAL_COMMUNITY): Payer: Medicare Other | Admitting: Anesthesiology

## 2013-04-16 ENCOUNTER — Encounter (HOSPITAL_COMMUNITY)
Admission: EM | Disposition: A | Discharge status: Home / Self Care | Payer: Self-pay | Source: Home / Self Care | Attending: General Surgery

## 2013-04-16 ENCOUNTER — Encounter (HOSPITAL_COMMUNITY): Payer: Self-pay | Admitting: Anesthesiology

## 2013-04-16 HISTORY — PX: CHOLECYSTECTOMY: SHX55

## 2013-04-16 LAB — HEPATIC FUNCTION PANEL
Albumin: 3.8 g/dL (ref 3.5–5.2)
Alkaline Phosphatase: 83 U/L (ref 39–117)
Total Bilirubin: 0.6 mg/dL (ref 0.3–1.2)

## 2013-04-16 LAB — GLUCOSE, CAPILLARY
Glucose-Capillary: 138 mg/dL — ABNORMAL HIGH (ref 70–99)
Glucose-Capillary: 134 mg/dL — ABNORMAL HIGH (ref 70–99)

## 2013-04-16 SURGERY — LAPAROSCOPIC CHOLECYSTECTOMY
Anesthesia: General | Site: Abdomen | Wound class: Contaminated

## 2013-04-16 MED ORDER — BUPIVACAINE HCL (PF) 0.5 % IJ SOLN
INTRAMUSCULAR | Status: DC | PRN
  Administered 2013-04-16: 10 mL

## 2013-04-16 MED ORDER — PREDNISONE 10 MG PO TABS
5.0000 mg | ORAL_TABLET | Freq: Every day | ORAL | Status: DC
  Administered 2013-04-16 – 2013-04-17 (×2): 5 mg via ORAL
  Filled 2013-04-16: qty 2
  Filled 2013-04-16: qty 1

## 2013-04-16 MED ORDER — ENOXAPARIN SODIUM 40 MG/0.4ML ~~LOC~~ SOLN
40.0000 mg | SUBCUTANEOUS | Status: DC
  Administered 2013-04-17: 40 mg via SUBCUTANEOUS
  Filled 2013-04-16: qty 0.4

## 2013-04-16 MED ORDER — FENTANYL CITRATE 0.05 MG/ML IJ SOLN
INTRAMUSCULAR | Status: DC | PRN
  Administered 2013-04-16: 50 ug via INTRAVENOUS
  Administered 2013-04-16 (×2): 100 ug via INTRAVENOUS
  Administered 2013-04-16 (×2): 50 ug via INTRAVENOUS
  Administered 2013-04-16: 150 ug via INTRAVENOUS

## 2013-04-16 MED ORDER — MIDAZOLAM HCL 2 MG/2ML IJ SOLN
1.0000 mg | INTRAMUSCULAR | Status: DC | PRN
  Administered 2013-04-16: 2 mg via INTRAVENOUS

## 2013-04-16 MED ORDER — KETOROLAC TROMETHAMINE 30 MG/ML IJ SOLN
INTRAMUSCULAR | Status: AC
  Filled 2013-04-16: qty 1

## 2013-04-16 MED ORDER — BACLOFEN 10 MG PO TABS
5.0000 mg | ORAL_TABLET | Freq: Three times a day (TID) | ORAL | Status: DC
  Administered 2013-04-16 – 2013-04-17 (×3): 5 mg via ORAL
  Filled 2013-04-16 (×6): qty 0.5

## 2013-04-16 MED ORDER — FENTANYL CITRATE 0.05 MG/ML IJ SOLN
INTRAMUSCULAR | Status: AC
  Filled 2013-04-16: qty 5

## 2013-04-16 MED ORDER — MIDAZOLAM HCL 5 MG/5ML IJ SOLN
INTRAMUSCULAR | Status: DC | PRN
  Administered 2013-04-16: 2 mg via INTRAVENOUS

## 2013-04-16 MED ORDER — BUPIVACAINE HCL (PF) 0.5 % IJ SOLN
INTRAMUSCULAR | Status: AC
  Filled 2013-04-16: qty 30

## 2013-04-16 MED ORDER — GLYCOPYRROLATE 0.2 MG/ML IJ SOLN
0.2000 mg | Freq: Once | INTRAMUSCULAR | Status: AC
  Administered 2013-04-16: 0.2 mg via INTRAVENOUS

## 2013-04-16 MED ORDER — GLYCOPYRROLATE 0.2 MG/ML IJ SOLN
INTRAMUSCULAR | Status: AC
  Filled 2013-04-16: qty 2

## 2013-04-16 MED ORDER — ALBUTEROL SULFATE (5 MG/ML) 0.5% IN NEBU
INHALATION_SOLUTION | RESPIRATORY_TRACT | Status: AC
  Filled 2013-04-16: qty 0.5

## 2013-04-16 MED ORDER — GLYCOPYRROLATE 0.2 MG/ML IJ SOLN
INTRAMUSCULAR | Status: DC | PRN
  Administered 2013-04-16: 0.4 mg via INTRAVENOUS
  Administered 2013-04-16: 0.2 mg via INTRAVENOUS

## 2013-04-16 MED ORDER — ALBUTEROL SULFATE HFA 108 (90 BASE) MCG/ACT IN AERS: 2.0000 | INHALATION_SPRAY | Freq: Four times a day (QID) | RESPIRATORY_TRACT | Status: DC | PRN

## 2013-04-16 MED ORDER — LACTATED RINGERS IV SOLN
INTRAVENOUS | Status: DC
  Administered 2013-04-16 – 2013-04-17 (×3): via INTRAVENOUS

## 2013-04-16 MED ORDER — GLATIRAMER ACETATE 20 MG/ML ~~LOC~~ KIT: 40.0000 mg | PACK | SUBCUTANEOUS | Status: DC

## 2013-04-16 MED ORDER — LACTATED RINGERS IV SOLN
INTRAVENOUS | Status: DC
  Administered 2013-04-16: 1000 mL via INTRAVENOUS
  Administered 2013-04-16: 14:00:00 via INTRAVENOUS

## 2013-04-16 MED ORDER — METHYLPREDNISOLONE SODIUM SUCC 125 MG IJ SOLR: 60.0000 mg | Freq: Once | INTRAMUSCULAR | Status: DC

## 2013-04-16 MED ORDER — ONDANSETRON HCL 4 MG/2ML IJ SOLN
INTRAMUSCULAR | Status: AC
  Filled 2013-04-16: qty 2

## 2013-04-16 MED ORDER — ONDANSETRON HCL 4 MG/2ML IJ SOLN
4.0000 mg | Freq: Once | INTRAMUSCULAR | Status: AC
  Administered 2013-04-16: 4 mg via INTRAVENOUS

## 2013-04-16 MED ORDER — LIDOCAINE HCL (PF) 1 % IJ SOLN
INTRAMUSCULAR | Status: AC
  Filled 2013-04-16: qty 5

## 2013-04-16 MED ORDER — PROPOFOL 10 MG/ML IV BOLUS
INTRAVENOUS | Status: DC | PRN
  Administered 2013-04-16: 200 mg via INTRAVENOUS

## 2013-04-16 MED ORDER — METHYLPREDNISOLONE SODIUM SUCC 125 MG IJ SOLR
INTRAMUSCULAR | Status: AC
  Filled 2013-04-16: qty 2

## 2013-04-16 MED ORDER — MIDAZOLAM HCL 2 MG/2ML IJ SOLN
INTRAMUSCULAR | Status: AC
  Filled 2013-04-16: qty 2

## 2013-04-16 MED ORDER — ONDANSETRON HCL 4 MG/2ML IJ SOLN: 4.0000 mg | Freq: Once | INTRAMUSCULAR | Status: DC | PRN

## 2013-04-16 MED ORDER — ENOXAPARIN SODIUM 40 MG/0.4ML ~~LOC~~ SOLN
SUBCUTANEOUS | Status: AC
  Filled 2013-04-16: qty 0.4

## 2013-04-16 MED ORDER — ROCURONIUM BROMIDE 50 MG/5ML IV SOLN
INTRAVENOUS | Status: AC
  Filled 2013-04-16: qty 1

## 2013-04-16 MED ORDER — CLINDAMYCIN PHOSPHATE 900 MG/50ML IV SOLN
INTRAVENOUS | Status: DC | PRN
  Administered 2013-04-16: 900 mg via INTRAVENOUS

## 2013-04-16 MED ORDER — LABETALOL HCL 5 MG/ML IV SOLN
INTRAVENOUS | Status: AC
  Filled 2013-04-16: qty 4

## 2013-04-16 MED ORDER — SODIUM CHLORIDE 0.9 % IR SOLN
Status: DC | PRN
  Administered 2013-04-16: 1000 mL

## 2013-04-16 MED ORDER — KETOROLAC TROMETHAMINE 30 MG/ML IJ SOLN
30.0000 mg | Freq: Once | INTRAMUSCULAR | Status: AC
  Administered 2013-04-16: 30 mg via INTRAVENOUS

## 2013-04-16 MED ORDER — PROPOFOL 10 MG/ML IV EMUL
INTRAVENOUS | Status: AC
  Filled 2013-04-16: qty 20

## 2013-04-16 MED ORDER — FENTANYL CITRATE 0.05 MG/ML IJ SOLN
25.0000 ug | INTRAMUSCULAR | Status: DC | PRN
  Administered 2013-04-16 (×2): 50 ug via INTRAVENOUS

## 2013-04-16 MED ORDER — ALBUTEROL SULFATE (5 MG/ML) 0.5% IN NEBU
2.5000 mg | INHALATION_SOLUTION | Freq: Once | RESPIRATORY_TRACT | Status: AC
  Administered 2013-04-16: 2.5 mg via RESPIRATORY_TRACT

## 2013-04-16 MED ORDER — ROCURONIUM BROMIDE 100 MG/10ML IV SOLN
INTRAVENOUS | Status: DC | PRN
  Administered 2013-04-16: 40 mg via INTRAVENOUS

## 2013-04-16 MED ORDER — CLINDAMYCIN PHOSPHATE 900 MG/50ML IV SOLN
INTRAVENOUS | Status: AC
  Filled 2013-04-16: qty 50

## 2013-04-16 MED ORDER — FENTANYL CITRATE 0.05 MG/ML IJ SOLN
INTRAMUSCULAR | Status: AC
  Filled 2013-04-16: qty 2

## 2013-04-16 MED ORDER — METHYLPREDNISOLONE SODIUM SUCC 125 MG IJ SOLR
125.0000 mg | Freq: Once | INTRAMUSCULAR | Status: AC
  Administered 2013-04-16: 125 mg via INTRAVENOUS
  Filled 2013-04-16: qty 2

## 2013-04-16 MED ORDER — SUCCINYLCHOLINE CHLORIDE 20 MG/ML IJ SOLN
INTRAMUSCULAR | Status: DC | PRN
  Administered 2013-04-16: 200 mg via INTRAVENOUS

## 2013-04-16 MED ORDER — CLINDAMYCIN PHOSPHATE 900 MG/50ML IV SOLN
900.0000 mg | Freq: Once | INTRAVENOUS | Status: AC
  Administered 2013-04-16: 900 mg via INTRAVENOUS

## 2013-04-16 MED ORDER — LOSARTAN POTASSIUM 50 MG PO TABS
100.0000 mg | ORAL_TABLET | Freq: Every day | ORAL | Status: DC
  Administered 2013-04-16 – 2013-04-17 (×2): 100 mg via ORAL
  Filled 2013-04-16 (×2): qty 2

## 2013-04-16 MED ORDER — HEMOSTATIC AGENTS (NO CHARGE) OPTIME
TOPICAL | Status: DC | PRN
  Administered 2013-04-16: 1 via TOPICAL

## 2013-04-16 MED ORDER — METHYLPREDNISOLONE SODIUM SUCC 125 MG IJ SOLR
60.0000 mg | Freq: Once | INTRAMUSCULAR | Status: AC
  Administered 2013-04-16: 60 mg via INTRAVENOUS

## 2013-04-16 MED ORDER — SUCCINYLCHOLINE CHLORIDE 20 MG/ML IJ SOLN
INTRAMUSCULAR | Status: AC
  Filled 2013-04-16: qty 1

## 2013-04-16 MED ORDER — NEOSTIGMINE METHYLSULFATE 1 MG/ML IJ SOLN
INTRAMUSCULAR | Status: DC | PRN
  Administered 2013-04-16: 3 mg via INTRAVENOUS

## 2013-04-16 MED ORDER — GLYCOPYRROLATE 0.2 MG/ML IJ SOLN
INTRAMUSCULAR | Status: AC
  Filled 2013-04-16: qty 1

## 2013-04-16 SURGICAL SUPPLY — 37 items
APPLIER CLIP LAPSCP 10X32 DD (CLIP) ×2 IMPLANT
BAG HAMPER (MISCELLANEOUS) ×2 IMPLANT
BAG SPEC RTRVL LRG 6X4 10 (ENDOMECHANICALS) ×1
CLOTH BEACON ORANGE TIMEOUT ST (SAFETY) ×2 IMPLANT
COVER LIGHT HANDLE STERIS (MISCELLANEOUS) ×4 IMPLANT
DECANTER SPIKE VIAL GLASS SM (MISCELLANEOUS) ×2 IMPLANT
DURAPREP 26ML APPLICATOR (WOUND CARE) ×2 IMPLANT
ELECT REM PT RETURN 9FT ADLT (ELECTROSURGICAL) ×2
ELECTRODE REM PT RTRN 9FT ADLT (ELECTROSURGICAL) ×1 IMPLANT
FILTER SMOKE EVAC LAPAROSHD (FILTER) ×2 IMPLANT
FORMALIN 10 PREFIL 120ML (MISCELLANEOUS) ×2 IMPLANT
GLOVE BIO SURGEON STRL SZ7.5 (GLOVE) ×2 IMPLANT
GLOVE BIOGEL PI IND STRL 7.0 (GLOVE) ×2 IMPLANT
GLOVE BIOGEL PI INDICATOR 7.0 (GLOVE) ×2
GLOVE ECLIPSE 6.5 STRL STRAW (GLOVE) ×4 IMPLANT
GLOVE SS BIOGEL STRL SZ 6.5 (GLOVE) ×1 IMPLANT
GLOVE SUPERSENSE BIOGEL SZ 6.5 (GLOVE) ×1
GOWN STRL REIN XL XLG (GOWN DISPOSABLE) ×6 IMPLANT
HEMOSTAT SNOW SURGICEL 2X4 (HEMOSTASIS) ×2 IMPLANT
INST SET LAPROSCOPIC AP (KITS) ×2 IMPLANT
IV NS IRRIG 3000ML ARTHROMATIC (IV SOLUTION) IMPLANT
KIT ROOM TURNOVER APOR (KITS) ×2 IMPLANT
KIT TROCAR LAP CHOLE (TROCAR) ×2 IMPLANT
MANIFOLD NEPTUNE II (INSTRUMENTS) ×2 IMPLANT
NS IRRIG 1000ML POUR BTL (IV SOLUTION) ×2 IMPLANT
PACK LAP CHOLE LZT030E (CUSTOM PROCEDURE TRAY) ×2 IMPLANT
PAD ARMBOARD 7.5X6 YLW CONV (MISCELLANEOUS) ×2 IMPLANT
POUCH SPECIMEN RETRIEVAL 10MM (ENDOMECHANICALS) ×2 IMPLANT
SET BASIN LINEN APH (SET/KITS/TRAYS/PACK) ×2 IMPLANT
SET TUBE IRRIG SUCTION NO TIP (IRRIGATION / IRRIGATOR) IMPLANT
SPONGE GAUZE 2X2 8PLY STRL LF (GAUZE/BANDAGES/DRESSINGS) ×8 IMPLANT
STAPLER VISISTAT (STAPLE) ×2 IMPLANT
SUT VICRYL 0 UR6 27IN ABS (SUTURE) ×2 IMPLANT
TAPE CLOTH SURG 4X10 WHT LF (GAUZE/BANDAGES/DRESSINGS) ×2 IMPLANT
TUBING INSUFFLATION (TUBING) ×2 IMPLANT
WARMER LAPAROSCOPE (MISCELLANEOUS) ×2 IMPLANT
YANKAUER SUCT 12FT TUBE ARGYLE (SUCTIONS) ×2 IMPLANT

## 2013-04-16 NOTE — Anesthesia Procedure Notes (Signed)
Procedure Name: Intubation Date/Time: 04/16/2013 1:37 PM Performed by: Despina Hidden Pre-anesthesia Checklist: Emergency Drugs available, Suction available, Patient being monitored and Patient identified Patient Re-evaluated:Patient Re-evaluated prior to inductionOxygen Delivery Method: Circle system utilized Preoxygenation: Pre-oxygenation with 100% oxygen Intubation Type: IV induction, Cricoid Pressure applied and Rapid sequence Ventilation: Oral airway inserted - appropriate to patient size and Mask ventilation with difficulty Grade View: Grade III Tube type: Oral Number of attempts: 2 Airway Equipment and Method: Stylet,  Video-laryngoscopy and Oral airway Placement Confirmation: breath sounds checked- equal and bilateral and positive ETCO2 Secured at: 22 cm Tube secured with: Tape Dental Injury: Teeth and Oropharynx as per pre-operative assessment  Difficulty Due To: Difficult Airway- due to large tongue, Difficulty was anticipated and Difficult Airway- due to limited oral opening Future Recommendations: Recommend- induction with short-acting agent, and alternative techniques readily available

## 2013-04-16 NOTE — Op Note (Signed)
Patient:  Chad Vasquez  DOB:  18-Jan-1961  MRN:  [id number]   Preop Diagnosis:  Cholecystitis, cholelithiasis  Postop Diagnosis:  Same  Procedure:  Laparoscopic cholecystectomy  Surgeon:  Franky Macho, M.D.  Anes:  General endotracheal  Indications:  Patient is a 52 year old white male who presents with biliary colic secondary to cholelithiasis. The risks and benefits of the procedure including bleeding, infection, hepatobiliary injury, and the possibility of an open procedure were fully explained to the patient, who gave informed consent.  Procedure note:  The patient was placed in the supine position. After induction of general endotracheal anesthesia, the abdomen was prepped and draped using usual sterile technique with DuraPrep. Surgical site confirmation was performed.  A supraumbilical incision was made down to the fascia. A Veress needle was introduced into the abdominal cavity and confirmation of placement was done using the saline drop test. The abdomen was then insufflated to 16 mm mercury pressure. An 11 mm trocar was introduced into the abdominal cavity under direct visualization without difficulty. The patient was placed in reverse Trendelenburg position and additional 11 mm trocar was placed the epigastric region and 5 mm trochars were placed the right upper quadrant and right flank regions. Liver was inspected and noted within normal limits. The gallbladder was noted to be acutely inflamed and distended. A thickened gallbladder wall is noted. The gallbladder did have to be decompressed in order to facilitate dynamic exposure. The triangle of Calot was then exposed. The cystic duct was first identified. Its juncture to the infundibulum was fully identified. Endoclips were placed proximally and distally on the cystic duct and cystic duct was divided. This was likewise done to the cystic artery. The gallbladder was then freed away from the gallbladder fossa using Bovie  electrocautery. The gallbladder was delivered through the epigastric trocar site using an Endo Catch bag. The gallbladder fossa was inspected no abnormal bleeding or bile leakage was noted. Surgicel is placed the gallbladder fossa. All fluid and air were then evacuated from the abdominal cavity prior to removal of the trochars.  All wounds were irrigated normal saline. All wounds were injected with 0.5% Sensorcaine. The supraumbilical fascia as well as suprapubic fascia were reapproximated using 0 Vicryl interrupted sutures. All skin incisions were closed using staples. Betadine ointment and dressed a dressings were applied.  All tape and needle counts were correct at the end of the procedure. Patient was extubated in the operating room and transferred to PACU in stable condition.  Complications:  None  EBL:  Minimal  Specimen:  Gallbladder

## 2013-04-16 NOTE — Anesthesia Postprocedure Evaluation (Signed)
  Anesthesia Post-op Note  Patient: Chad Vasquez  Procedure(s) Performed: Procedure(s): LAPAROSCOPIC CHOLECYSTECTOMY (N/A)  Patient Location: PACU  Anesthesia Type:General  Level of Consciousness: awake, alert , oriented and patient cooperative  Airway and Oxygen Therapy: Patient Spontanous Breathing and Patient connected to face mask oxygen  Post-op Pain: 3 /10, mild  Post-op Assessment: Post-op Vital signs reviewed, Patient's Cardiovascular Status Stable, Respiratory Function Stable, Patent Airway, No signs of Nausea or vomiting and Pain level controlled  Post-op Vital Signs: Reviewed and stable  Complications: No apparent anesthesia complications

## 2013-04-16 NOTE — Transfer of Care (Signed)
Immediate Anesthesia Transfer of Care Note  Patient: Chad Vasquez  Procedure(s) Performed: Procedure(s): LAPAROSCOPIC CHOLECYSTECTOMY (N/A)  Patient Location: PACU  Anesthesia Type:General  Level of Consciousness: awake and patient cooperative  Airway & Oxygen Therapy: Patient Spontanous Breathing and Patient connected to face mask oxygen  Post-op Assessment: Report given to PACU RN, Post -op Vital signs reviewed and stable and Patient moving all extremities  Post vital signs: Reviewed and stable  Complications: No apparent anesthesia complications

## 2013-04-16 NOTE — Anesthesia Preprocedure Evaluation (Signed)
Anesthesia Evaluation  Patient identified by MRN, date of birth, ID band Patient awake    Reviewed: Allergy & Precautions, H&P , NPO status , Patient's Chart, lab work & pertinent test results  Airway Mallampati: III TM Distance: >3 FB Neck ROM: Full    Dental  (+) Teeth Intact   Pulmonary sleep apnea , COPD Sarcoidosis breath sounds clear to auscultation        Cardiovascular hypertension, Pt. on medications Rhythm:Regular Rate:Normal     Neuro/Psych Hx multiple sclerosis    GI/Hepatic (+)     substance abuse  cocaine use and marijuana use,   Endo/Other  diabetes, Type obesity  Renal/GU      Musculoskeletal   Abdominal   Peds  Hematology   Anesthesia Other Findings   Reproductive/Obstetrics                           Anesthesia Physical Anesthesia Plan  ASA: III  Anesthesia Plan: General   Post-op Pain Management:    Induction: Intravenous, Rapid sequence and Cricoid pressure planned  Airway Management Planned: Oral ETT and Video Laryngoscope Planned  Additional Equipment:   Intra-op Plan:   Post-operative Plan: Extubation in OR  Informed Consent: I have reviewed the patients History and Physical, chart, labs and discussed the procedure including the risks, benefits and alternatives for the proposed anesthesia with the patient or authorized representative who has indicated his/her understanding and acceptance.     Plan Discussed with:   Anesthesia Plan Comments:         Anesthesia Quick Evaluation

## 2013-04-16 NOTE — Progress Notes (Signed)
UR chart review completed.  

## 2013-04-17 LAB — CBC
HCT: 37.1 % — ABNORMAL LOW (ref 39.0–52.0)
MCHC: 34.2 g/dL (ref 30.0–36.0)
MCV: 88.1 fL (ref 78.0–100.0)
RDW: 13.2 % (ref 11.5–15.5)

## 2013-04-17 LAB — BASIC METABOLIC PANEL
BUN: 13 mg/dL (ref 6–23)
Calcium: 9.6 mg/dL (ref 8.4–10.5)
GFR calc non Af Amer: >90 mL/min (ref >90)
Glucose, Bld: 179 mg/dL — ABNORMAL HIGH (ref 70–99)
Sodium: 137 mEq/L (ref 135–145)

## 2013-04-17 LAB — GLUCOSE, CAPILLARY

## 2013-04-17 LAB — PHOSPHORUS: Phosphorus: 2.3 mg/dL (ref 2.3–4.6)

## 2013-04-17 MED ORDER — OXYCODONE-ACETAMINOPHEN 7.5-500 MG PO TABS: 1.0000 | ORAL_TABLET | ORAL | Status: DC | PRN

## 2013-04-17 MED ORDER — AMOXICILLIN-POT CLAVULANATE 875-125 MG PO TABS: 1.0000 | ORAL_TABLET | Freq: Two times a day (BID) | ORAL | Status: DC

## 2013-04-17 NOTE — Discharge Summary (Signed)
Physician Discharge Summary  Patient ID: Chad Vasquez MRN: [id number] DOB/AGE: 1961-02-15 52 y.o.  Admit date: 04/15/2013 Discharge date: 04/17/2013  Admission Diagnoses: Acute cholecystitis, cholelithiasis  Discharge Diagnoses: Same Active Problems:   * No active hospital problems. *   Discharged Condition: good  Hospital Course: Patient is a 52 year old white male multiple medical problems who presented emergency room with worsening right upper quadrant abdominal pain. He was found on ultrasound the gallbladder to have acute cholecystitis with cholelithiasis. Using a the hospital for further evaluation treatment. He subsequently underwent a laparoscopic cholecystectomy on 04/16/2013. Tolerated the procedure well. He recovered in the step down unit due to his multiple medical problems including sarcoidosis and sleep apnea. He did very well postoperatively. His diet was advanced at difficulty. He is being discharged home in good improving condition. His leukocytosis is probably related to his stress dose steroids that he received.  Treatments: surgery: Laparoscopic cholecystectomy on 04/16/2013  Discharge Exam: Blood pressure 119/65, pulse 84, temperature 98.6 F (37 C), temperature source Oral, resp. rate 16, height 6\' 3"  (1.905 m), weight 129.729 kg (286 lb), SpO2 94.00%. General appearance: alert, cooperative and no distress Resp: clear to auscultation bilaterally Cardio: regular rate and rhythm, S1, S2 normal, no murmur, click, rub or gallop GI: Soft. Dressings dry and intact.  Disposition: Home   Future Appointments Provider Department Dept Phone   11/05/2013 2:00 PM Suanne Marker, MD [facility] [phone]       Medication List    STOP taking these medications       HYDROcodone-acetaminophen 7.5-325 MG per tablet  Commonly known as:  NORCO      TAKE these medications       acetaminophen 325 MG tablet  Commonly known as:  TYLENOL  Take 2  tablets (650 mg total) by mouth every 6 (six) hours as needed for pain or fever (or Fever >/= 101).     albuterol 108 (90 BASE) MCG/ACT inhaler  Commonly known as:  PROVENTIL HFA;VENTOLIN HFA  Inhale 2 puffs into the lungs every 6 (six) hours as needed for wheezing.     amoxicillin-clavulanate 875-125 MG per tablet  Commonly known as:  AUGMENTIN  Take 1 tablet by mouth 2 (two) times daily.     baclofen 10 MG tablet  Commonly known as:  LIORESAL  Take 5 mg by mouth 3 (three) times daily.     cholecalciferol 1000 UNITS tablet  Commonly known as:  VITAMIN D  Take 1,000 Units by mouth daily.     diazepam 5 MG tablet  Commonly known as:  VALIUM  Take 5 mg by mouth 3 (three) times daily.     docusate sodium 100 MG capsule  Commonly known as:  COLACE  Take 100 mg by mouth daily.     glatiramer 20 MG/ML injection  Commonly known as:  COPAXONE  Inject 40 mg into the skin every 3 (three) days.     insulin aspart 100 UNIT/ML injection  Commonly known as:  novoLOG  Inject 0-15 Units into the skin 3 (three) times daily with meals.     losartan 100 MG tablet  Commonly known as:  COZAAR  Take 100 mg by mouth daily.     oxyCODONE-acetaminophen 7.5-500 MG per tablet  Commonly known as:  PERCOCET  Take 1-2 tablets by mouth every 4 (four) hours as needed for pain.     pantoprazole 40 MG tablet  Commonly known as:  PROTONIX  Take 1 tablet (40  mg total) by mouth 2 (two) times daily before a meal.     potassium chloride SA 20 MEQ tablet  Commonly known as:  K-DUR,KLOR-CON  Take 20 mEq by mouth daily.     predniSONE 5 MG tablet  Commonly known as:  DELTASONE  Take 5 mg by mouth daily.           Follow-up Information   Follow up with Dalia Heading, MD. Schedule an appointment as soon as possible for a visit on 04/26/2013.   Contact information:   [address] [place] [address] [phone]       Signed: Franky Macho A 04/17/2013, 7:53 AM

## 2013-04-17 NOTE — Progress Notes (Signed)
UR chart review completed.  

## 2013-04-17 NOTE — Progress Notes (Signed)
Discharge instructions given on medications,and follow up visits,patient verbalized understanding.Prescriptions sent with patient. Instructions also given on Laparoscopic Cholecystectomy care after surgery, and when to seek medical care,patient,and family member verbalized understanding. Accompanied by staff to an awaiting vehicle.

## 2013-04-18 ENCOUNTER — Encounter (HOSPITAL_COMMUNITY): Payer: Self-pay | Admitting: General Surgery

## 2013-04-25 ENCOUNTER — Telehealth: Payer: Self-pay | Admitting: Neurology

## 2013-04-25 NOTE — Telephone Encounter (Signed)
I spoke to a former Dr. Sandria Manly patient who has been assigned to Dr. Marjory Lies.  Patient recently had gall bladder surgery and is now having a MS flare, with difficulty walking, can't pick up his legs and right leg aching.  His appointment is not until Feb 2015.  Please advise.

## 2013-04-25 NOTE — Telephone Encounter (Signed)
Spoke with Dr. Marjory Lies.  Ok'd for L. Lam NP to see.  Made appt for Monday 04-30-13 at 0900.  Pt aware.  Tried to get in tomorrow am, he had another appt.

## 2013-04-25 NOTE — Telephone Encounter (Signed)
Also patient is on Copaxone and taking Prednisone 5mg  daily.

## 2013-04-28 ENCOUNTER — Encounter: Payer: Self-pay | Admitting: Nurse Practitioner

## 2013-04-30 ENCOUNTER — Encounter: Payer: Self-pay | Admitting: Nurse Practitioner

## 2013-04-30 ENCOUNTER — Ambulatory Visit (INDEPENDENT_AMBULATORY_CARE_PROVIDER_SITE_OTHER): Payer: Medicare Other | Admitting: Nurse Practitioner

## 2013-04-30 VITALS — BP 101/64 | HR 61 | Temp 97.9°F | Ht 76.0 in | Wt 273.0 lb

## 2013-04-30 DIAGNOSIS — R269 Unspecified abnormalities of gait and mobility: Secondary | ICD-10-CM

## 2013-04-30 DIAGNOSIS — G35 Multiple sclerosis: Secondary | ICD-10-CM

## 2013-04-30 MED ORDER — LIDOCAINE 5 % EX PTCH: 1.0000 | MEDICATED_PATCH | CUTANEOUS | Status: DC

## 2013-04-30 MED ORDER — BACLOFEN 10 MG PO TABS: 10.0000 mg | ORAL_TABLET | Freq: Three times a day (TID) | ORAL | Status: DC

## 2013-04-30 NOTE — Progress Notes (Signed)
[facility]  PATIENT: Chad Vasquez DOB: April 24, 1961   HISTORY FROM: patient, wife, chart REASON FOR VISIT: establish care, former Dr. Sandria Manly patient   HISTORICAL  CHIEF COMPLAINT:  Chief Complaint  Patient presents with  . Establish Care    RV #[id number]    HISTORY OF PRESENT ILLNESS: UPDATE 04/30/13 (LL):  [name] for work-in appointment, having increased bilateral leg weakness and pain in right leg since cholecystectomy on July 14.  States he was getting around pretty well before that, able to walk with a walker.  Now he feels like his legs "are stuck in the mud" and he cannot pick them up.  He is having to pick the legs up with his arms to adjust them in the wheelchair.  He is not getting any PT but does regular exercises at home that PT taught his previously.  He would rather not take any additional prednisone if he can avoid it.  He is currently on Prednisone 5mg  daily, and Dr. Dierdre Forth hopes to wean it to off.  He denies any problems with bowel or bladder.  He has "cold" pain from the knee down on the right leg and c/o burning "bee-sting" pain in the right lateral thigh. He does not drive since 1610, a lady friend helps him to appointments.  (Dr. Sandria Manly):  Mr Chad Vasquez is a 52 year old Caucasian male, from [place], [place] who developed leg weakness beginning in 2007 with difficulty putting weight on his left leg. He would get weak for a period of time then he would be "okay" for 4-5 months. He could go year between episodes. He continued work. At the end of 2008 he noted difficulty walking that was persistent. April 2010 he got up one morning and couldn't walk. He went to [facility] emergency room and was admitted. CAT scan of the brain 01/27/1999 and showed hypoattenuation in the subcortical periventricular white matter consistent with chronic microvascular disease or MS. MRI study of the brain and cervical spine 01/27/09 and showed  premature atrophy with numerous white matter lesions in the brain suggestive chronic MS. There was a lesion at C3-4 in the spinal cord in the right hemicord consistent with the diagnosis of multiple sclerosis. The was also an area of T2-3, suspicious of a lesion. No lesions enhance. I reviewed these studies. LP was performed 01/28/2009 with glucose 78, WBC 6, RBC 3, increased lgG synthesis of 15.2, increased lgG index, and greater than 5 oligoclonal bands. Urine test was positive for cocaine. Blood studies were negative for B12, RPR, homocysteine, and TSH. He went on generic Betaseron injections every other day beginning June 2010. He was switched to [facility] March 2011. I saw him 02/09/2010 obtained ANA, ACE, HIV, Lyme titer, and RA factor which were negative. He has never seen an ophthalmologist or optometrist. He felt badly when he began taking Betaseron with symptoms of aching, and nausea. He stopped Betaseron September 2012 and felt better off medication. MRI study the brain and cervical spine 09/15/2010 showed multiple supratentorial white matter demyelinating plaques without change versus 01/27/2009. Cervical spine showed chronic demyelinating plaques at C3 and T2 with the lesions decreased in size versus 01/27/2009.  He received IV Solu-Medrol December 2011. He began Copaxone November 2012. March 2013 he noted numbness in his right leg progressing to involve his right hip, knee, and ankle. Dr. Phillips Odor had an MRI of the lumbar spine showing multilevel degenerative disc disease but no significant canal stenosis  and no significant root compression. There were anteriorlisthesis of L5 on S1. Patient was seen by Dr. Colon Branch 03/08/2012 and patient was placed on nonsteroidal anti-inflammatory medications. 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 He had weakness and numbness in the right leg. 04/12/12 his symptoms improved. He continued however to have weakness in his right  greater than left leg with Lhermitte's sign. He began having spasms in his legs. No electrical shocks in his legs which improved with carbamazepine 200 mg 3 times daily. 05/07/2012 he developed fever, pain in his hips and legs, and quadriparesis. He was admitted to [facility] where CT scan of the chest abdomen and pelvis revealed mediastinal and hilar adenopathy and hepatic steatosis. MRIs showed stable lesions in the brain, cervical, and thoracic spine and DJD in the lumbar spine. His ACE level was greater than 100 ESR 30, CRP 29, CK 60, and a mediastinal lymph node showing noncaseating granuloma consistent with sarcoidosis. He was placed on high-dose prednisone and gradually tapered to his current dose of 5 mg daily. 05/20/2012 he was unable to move his legs. He was discharged home 06/16/2012 with home PT. He can stand and use a walker or wheelchair. He requires assistance dressing himself but can bathe himself and take care of his toileting needs. He has bowel and bladder continence. His walking ability has decreased as the prednisone has been decreased. Has swelling in his feet and legs. His weight has gone up to 290 pounds. DEXA scan January 2014 was normal. Has increased tone in the legs.  Sees Dr. Rikki Spearing for Sarcoidosis.  REVIEW OF SYSTEMS: Full 14 system review of systems performed and notable only for:  Constitutional: N/A  Cardiovascular: N/A  Ear/Nose/Throat: N/A  Skin: N/A  Eyes: N/A  Respiratory: N/A  Gastroitestinal: N/A  Hematology/Lymphatic: N/A  Endocrine: N/A Musculoskeletal:N/A  Allergy/Immunology: N/A  Neurological: N/A Psychiatric: N/A   ALLERGIES: No Known Allergies  HOME MEDICATIONS: Outpatient Prescriptions Prior to Visit  Medication Sig Dispense Refill  . acetaminophen (TYLENOL) 325 MG tablet Take 2 tablets (650 mg total) by mouth every 6 (six) hours as needed for pain or fever (or Fever >/= 101).      Marland Kitchen albuterol (PROVENTIL HFA;VENTOLIN HFA) 108 (90 BASE)  MCG/ACT inhaler Inhale 2 puffs into the lungs every 6 (six) hours as needed for wheezing.  1 Inhaler  6  . baclofen (LIORESAL) 10 MG tablet Take 5 mg by mouth 3 (three) times daily.       . cholecalciferol (VITAMIN D) 1000 UNITS tablet Take 1,000 Units by mouth daily.      . diazepam (VALIUM) 5 MG tablet Take 5 mg by mouth 3 (three) times daily.      Marland Kitchen docusate sodium (COLACE) 100 MG capsule Take 100 mg by mouth daily.       Marland Kitchen glatiramer (COPAXONE) 20 MG/ML injection Inject 40 mg into the skin every 3 (three) days.       . insulin aspart (NOVOLOG) 100 UNIT/ML injection Inject 0-15 Units into the skin 3 (three) times daily with meals.  1 vial    . oxyCODONE-acetaminophen (PERCOCET) 7.5-500 MG per tablet Take 1-2 tablets by mouth every 4 (four) hours as needed for pain.  50 tablet  0  . pantoprazole (PROTONIX) 40 MG tablet Take 1 tablet (40 mg total) by mouth 2 (two) times daily before a meal.      . predniSONE (DELTASONE) 5 MG tablet Take 5 mg  by mouth daily.      Marland Kitchen amoxicillin-clavulanate (AUGMENTIN) 875-125 MG per tablet Take 1 tablet by mouth 2 (two) times daily.  14 tablet  0  . losartan (COZAAR) 100 MG tablet Take 100 mg by mouth daily.      . potassium chloride SA (K-DUR,KLOR-CON) 20 MEQ tablet Take 20 mEq by mouth daily.       No facility-administered medications prior to visit.    PAST MEDICAL HISTORY: Past Medical History  Diagnosis Date  . Multiple sclerosis 2010  . HTN (hypertension)   . Polysubstance abuse   . Anxiety and depression   . Chronic pain     PAST SURGICAL HISTORY: Past Surgical History  Procedure Laterality Date  . Mediastinoscopy  05/15/2012    Procedure: MEDIASTINOSCOPY;  Surgeon: Loreli Slot, MD;  Location: [facility] OR;  Service: Thoracic;  Laterality: N/A;  . Cholecystectomy N/A 04/16/2013    Procedure: LAPAROSCOPIC CHOLECYSTECTOMY;  Surgeon: Dalia Heading, MD;  Location: [facility] ORS;  Service: General;  Laterality: N/A;    FAMILY HISTORY: Family History    Problem Relation Age of Onset  . Heart failure Mother   . Stroke Mother   . Heart failure Father   . Cancer Sister   . Seizures Brother     SOCIAL HISTORY: History   Social History  . Marital Status: Single    Spouse Name: N/A    Number of Children: 0  . Years of Education: N/A   Occupational History  . auto technician      disabled    Social History Main Topics  . Smoking status: Former Smoker -- 1.00 packs/day for 30 years    Types: Cigarettes    Quit date: 05/01/2012  . Smokeless tobacco: Never Used  . Alcohol Use: No     Comment: quit 05/01/2012  . Drug Use: Yes    Special: Cocaine, Marijuana     Comment: 05/01/2012 last use  . Sexually Active: No   Other Topics Concern  . Not on file   Social History Narrative  . No narrative on file     PHYSICAL EXAM  Filed Vitals:   04/30/13 0913  BP: 101/64  Pulse: 61  Temp: 97.9 F (36.6 C)  TempSrc: Oral  Height: 6\' 4"  (1.93 m)  Weight: 273 lb (123.832 kg)   Body mass index is 33.24 kg/(m^2).  Generalized: In no acute distress, pleasant Caucasain male, seated in wheelchair   Neck: Supple, no carotid bruits   Cardiac: Regular rate rhythm, no murmur   Pulmonary: Clear to auscultation bilaterally   Musculoskeletal: No deformity  Neurological examination   Mentation: Alert oriented to time, place, history taking, language fluent, and causual conversation  Cranial nerve II-XII: Pupils were equal round reactive to light extraocular movements were full, visual field were full on confrontational test. facial sensation and strength were normal. hearing was intact to finger rubbing bilaterally. Uvula tongue midline. head turning and shoulder shrug and were normal and symmetric.Tongue protrusion into cheek strength was normal. MOTOR: normal bulk all extremities, 5/5 strength in the BUE.  2/5 strength in BLE, fine finger movements normal, no pronator drift SENSORY: normal and symmetric to light touch, pinprick,  temperature, vibration COORDINATION: finger-nose-finger normal  REFLEXES:  3+ no clonus GAIT/STATION: Rise up from seated position pushing off from chair, gait not tested, unsteady.  DIAGNOSTIC DATA (LABS, IMAGING, TESTING) - I reviewed patient records, labs, notes, testing and imaging myself where available.  Lab Results  Component Value  Date   WBC 16.0* 04/17/2013   HGB 12.7* 04/17/2013   HCT 37.1* 04/17/2013   MCV 88.1 04/17/2013   PLT 236 04/17/2013      Component Value Date/Time   NA 137 04/17/2013 0509   K 4.7 04/17/2013 0509   CL 102 04/17/2013 0509   CO2 27 04/17/2013 0509   GLUCOSE 179* 04/17/2013 0509   BUN 13 04/17/2013 0509   CREATININE 0.93 04/17/2013 0509   CALCIUM 9.6 04/17/2013 0509   PROT 7.2 04/16/2013 1751   ALBUMIN 3.8 04/16/2013 1751   AST 62* 04/16/2013 1751   ALT 116* 04/16/2013 1751   ALKPHOS 83 04/16/2013 1751   BILITOT 0.6 04/16/2013 1751   GFRNONAA >90 04/17/2013 0509   GFRAA >90 04/17/2013 0509   No results found for this basename: CHOL,  HDL,  LDLCALC,  LDLDIRECT,  TRIG,  CHOLHDL   Lab Results  Component Value Date   HGBA1C 6.0* 04/15/2013    ASSESSMENT AND PLAN  52 y.o. year old male  has a past medical history of Multiple sclerosis (2010); HTN (hypertension); Polysubstance abuse; Anxiety and depression; and Chronic pain. here with MS with spastic paraplegia; exacerbation following recent cholecystectomy.  PLAN: 1. Refill Baclofen 10 mg prescription for spastcity. 2. prescription for Lidoderm patches, apply to right outer thigh as needed for muscle pain. 3. Continue Copaxone. 4. PT for BLE weakness, gait difficulty. 5. Follow up 2-3 months, may discuss different MS treatment options at that time.  We are ordering Physical Therapy at [facility]. Orders Placed This Encounter  Procedures  . Ambulatory referral to Physical Therapy    Tylia Ewell NP-C 04/30/2013, 10:14 AM  [facility] [address] [place], [place]  [address] [phone]

## 2013-04-30 NOTE — Patient Instructions (Addendum)
Refilled Baclofen 10 mg prescription for spastcity.  Sent prescription for Lidoderm patches, apply to right outer thigh as needed for muscle pain.  Continue Copaxone.  We are ordering Physical Therapy at [facility].

## 2013-05-02 NOTE — Progress Notes (Signed)
I reviewed note and agree with plan.   VIKRAM R. PENUMALLI, MD 05/02/2013, 6:27 PM Certified in Neurology, Neurophysiology and Neuroimaging  [facility] [address], [address] [place],  [address] [phone]  

## 2013-07-26 ENCOUNTER — Ambulatory Visit (INDEPENDENT_AMBULATORY_CARE_PROVIDER_SITE_OTHER): Payer: Medicare Other | Admitting: Adult Health

## 2013-07-26 ENCOUNTER — Encounter: Payer: Self-pay | Admitting: Adult Health

## 2013-07-26 ENCOUNTER — Ambulatory Visit (INDEPENDENT_AMBULATORY_CARE_PROVIDER_SITE_OTHER)
Admission: RE | Admit: 2013-07-26 | Discharge: 2013-07-26 | Disposition: A | Discharge status: Home / Self Care | Payer: Medicare Other | Source: Ambulatory Visit | Attending: Adult Health | Admitting: Adult Health

## 2013-07-26 VITALS — BP 128/70 | HR 58 | Temp 97.7°F | Ht 75.0 in | Wt 267.8 lb

## 2013-07-26 DIAGNOSIS — G4733 Obstructive sleep apnea (adult) (pediatric): Secondary | ICD-10-CM

## 2013-07-26 DIAGNOSIS — J449 Chronic obstructive pulmonary disease, unspecified: Secondary | ICD-10-CM

## 2013-07-26 DIAGNOSIS — D869 Sarcoidosis, unspecified: Secondary | ICD-10-CM

## 2013-07-26 NOTE — Patient Instructions (Signed)
Continue your albuterol as needed for shortness of breath Continue your prednisone taper  as directed by Dr Cherlyn Cushing your CPAP every night.  Chest xray today .  Follow with Dr Delton Coombes in 6 months or sooner if you have any problems

## 2013-07-26 NOTE — Progress Notes (Signed)
Quick Note:  Advised pt of cxr results per TP. Pt verbalized understanding and will keep 6 month f/u with RB ______

## 2013-07-27 NOTE — Assessment & Plan Note (Signed)
Cont on CPAP At bedtime   Wt loss encouraged.

## 2013-07-27 NOTE — Progress Notes (Signed)
Subjective:    Patient ID: Chad Vasquez, male    DOB: Jun 03, 1961, 52 y.o.   MRN: [id number]  HPI 52 yo man, former smoker (40pk-yrs), hx of MS, OSA not on CPAP, sarcoidosis dx by mediastinal LAD bx in 9/13 by mediastinoscopy. He had negative quant gold, no evidence lymphoma. His primary sx associated with sarcoidosis has been fever. Some dyspnea, but this is actually better since he stopped smoking in 7/13.  Some occas wheeze, some cough in the am - better. He is currently on pred 10mg , weaning w Dr Dierdre Forth.   ROV 11/29/12 -- follow up for dyspnea, wheeze in setting sarcoidosis, tobacco hx. 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 He is followed by Dr Sandria Manly for MS vs neuro sarcoid, saw him yesterday and believes he has principally MS. Steroids are weaned to 5mg  daily, being managed by Dr Dierdre Forth. He started CPAP this month - compliance good, seems to be tolerating. He may feel a bit better during the day.   ROV 01/31/13 -- hx sarcoidosis (last CT scan was 8/'13), MS, OSA now on CPAP - gave his download chip to Dr Tresa Endo 3/29, compliance is good. He feels better, less fatigued. He remains on pred 5mg , being weaned by Dr Dierdre Forth. He started albuterol prn for mild AFL. Believes he is doing well and does not need scheduled meds right now.   07/27/2013 Follow up Sarcoid  Pt returns for follow up for sarcoid. Says he is doing well with no flare in cough or dyspnea.  Is following with Rheumatology Dr. Dierdre Forth with slow steroid taper. On prednsione 4mg  daily .  No SOB, cough, chest tightness. No rash, fever, cough, chest pain or edema.  Wears CPAP each night . No excessive daytime sleepiness.   Review of Systems  Constitutional: . Negative for fever and unexpected weight change.  HENT:   Negative for ear pain, nosebleeds, sore throat, rhinorrhea, sneezing, dental problem, postnasal drip and sinus pressure.   Eyes: Negative for redness and itching.   Respiratory:  Negative for cough and chest tightness.   Cardiovascular . Negative for leg swelling.  Gastrointestinal: Negative for nausea and vomiting.  Genitourinary: Negative for dysuria.  Musculoskeletal: Positive for arthralgias. Negative for joint swelling.  Skin: Negative for rash.  Neurological: Neg for HA Hematological: Does not bruise/bleed easily.  Psychiatric/Behavioral: Negative for dysphoric mood       Objective:   Physical Exam   Gen: Pleasant, well-nourished, in no distress,  normal affect, in wheelchair  ENT: No lesions,  mouth clear,  oropharynx clear, no postnasal drip  Neck: No JVD, no TMG, no carotid bruits  Lungs: No use of accessory muscles, no dullness to percussion, clear without rales or rhonchi  Cardiovascular: RRR, heart sounds normal, no murmur or gallops, 1+ peripheral edema  Musculoskeletal: No deformities, no cyanosis or clubbing  Neuro:  gen weakness , walks with walker   Skin: Warm, no lesions or rashes   CT Chest 05/12/12 --  Comparison: Multiple exams, including 05/07/2012  CT CHEST  Findings: Right paratracheal node measures 1.3 cm in short axis,  image 17 of series 3. An AP window lymph node measures 1.6 cm in  short axis, image 24 of series 3. Right hilar adenopathy noted  with one right hilar node measuring 1.5 cm in short axis, image 32  of series 3. Left hilar adenopathy noted with several small  calcifications, and also scattered infrahilar nodes. Conglomerate  subcarinal adenopathy measures 2.2 cm in short axis, image 30 of  series 3. Scattered small paraesophageal lymph nodes are present  in the lower thorax.  Heart size is within normal limits. A small hiatal hernia is  present.  A 4 mm densely calcified right upper lobe pulmonary nodule on image  14 of series 4 is compatible with calcified granuloma.  Centrilobular emphysema noted. There is mild atelectasis or  scarring in the posterior basal segments of both lower lobes.   Several additional small calcified right upper lobe granulomas are  present. There are several small calcified granulomas in the left  lung.  IMPRESSION:  1. Pathologic mediastinal and hilar adenopathy. No airway  thickening or pleural nodularity to suggest pulmonary parenchymal  findings of sarcoidosis. The appearance could reflect lymphoma.  Tissue diagnosis is likely warranted.  2. Small hiatal hernia.  3. Old granulomatous disease.      Assessment & Plan:

## 2013-07-27 NOTE — Assessment & Plan Note (Addendum)
Compensated without flare on lower steroid doses  Check cxr today  Cont on current regimen.   Plan  Continue your albuterol as needed for shortness of breath Continue your prednisone taper  as directed by Dr Dierdre Forth   Chest xray today .  Follow with Dr Delton Coombes in 6 months or sooner if you have any problems

## 2013-08-03 ENCOUNTER — Encounter: Payer: Self-pay | Admitting: Diagnostic Neuroimaging

## 2013-08-03 ENCOUNTER — Ambulatory Visit (INDEPENDENT_AMBULATORY_CARE_PROVIDER_SITE_OTHER): Payer: Medicare Other | Admitting: Diagnostic Neuroimaging

## 2013-08-03 VITALS — BP 138/84 | HR 53 | Ht 76.0 in

## 2013-08-03 DIAGNOSIS — R259 Unspecified abnormal involuntary movements: Secondary | ICD-10-CM

## 2013-08-03 DIAGNOSIS — R252 Cramp and spasm: Secondary | ICD-10-CM | POA: Insufficient documentation

## 2013-08-03 DIAGNOSIS — G35D Multiple sclerosis, unspecified: Secondary | ICD-10-CM

## 2013-08-03 DIAGNOSIS — G35 Multiple sclerosis: Secondary | ICD-10-CM | POA: Insufficient documentation

## 2013-08-03 MED ORDER — BACLOFEN 20 MG PO TABS: 20.0000 mg | ORAL_TABLET | Freq: Three times a day (TID) | ORAL | Status: DC

## 2013-08-03 NOTE — Patient Instructions (Signed)
Consider research study for extended release baclofen vs increasing current baclofen to 20mg  three times per day.

## 2013-08-03 NOTE — Progress Notes (Addendum)
[facility]  PATIENT: Chad Vasquez DOB: 01/05/1961  REFERRING CLINICIAN:  HISTORY FROM: patient and wife REASON FOR VISIT: follow up   HISTORICAL  CHIEF COMPLAINT:  Chief Complaint  Patient presents with  . Follow-up    MS    HISTORY OF PRESENT ILLNESS:   UPDATE 08/03/13: Since last visit patient complains of increasing cramps, stiffness, pain and spasticity in the legs. This typically affects him when he lays down at nighttime and to go to sleep. Sometimes it wakes him up out of sleep. Patient tells me that his sarcoidosis might be flaring up based on recent imaging, and he has followup with rheumatology to determine prednisone dosing.  UPDATE 04/30/13 (LL): [name] for work-in appointment, having increased bilateral leg weakness and pain in right leg since cholecystectomy on July 14. States he was getting around pretty well before that, able to walk with a walker. Now he feels like his legs "are stuck in the mud" and he cannot pick them up. He is having to pick the legs up with his arms to adjust them in the wheelchair. He is not getting any PT but does regular exercises at home that PT taught his previously. He would rather not take any additional prednisone if he can avoid it. He is currently on Prednisone 5mg  daily, and Dr. Dierdre Forth hopes to wean it to off. He denies any problems with bowel or bladder. He has "cold" pain from the knee down on the right leg and c/o burning "bee-sting" pain in the right lateral thigh. He does not drive since 1610, a lady friend helps him to appointments.  PRIOR HPI (11/28/12, Dr. Sandria Manly): 52 year old left-handed white single male from [place], [place] who developed left leg weakness beginning in 2007 with difficulty putting weight on his left leg. He would get weak for a period of time then  he would be "okay" for 4-5 months.He could go a year between episodes. He continued to work.At the end of 2008 he noted  difficulty walking that was persistent. 01/2009 he got up one morning and couldn't walk. He went to [facility] Room and was admitted. CAT scan of the brain 01/26/2009 showed hypoattenuation in the subcortical periventricular white matter consistent with chronic microvascular disease or MS. MRI study of the brain and cervical spine 01/27/2009 showed premature atrophy with numerous white matter lesions in the brain suggestive of chronic MS. There was a  lesion at C3-4 in the spinal cord in the right hemicord consistent with the diagnosis of multiple sclerosis. There was also an area at T2-3, suspicious of a lesion. No lesions enhanced. I reviewed these studies. LP was performed 4/27 with glucose 78, WBC 6, RBC 3, increased IgG synthesis of 15.2, increased IgG index, and greater than 5 oligoclonal bands. Urine test was positive for cocaine. Blood studies were negative for B12, RPR,homocysteine, and TSH. He went on generic Betaseron injections every other day beginning 03/2009. He was switched to [facility] 12/2009.I saw him 02/09/10 and obtained ANA, ACE, HIV, lyme titer, and RA factor which were negative. He has never seen an ophthalmologist or optometrist. He felt badly when he began taking Betaseron with symptoms of, aching, and nausea.He stopped Betaseron 06/2011 and  felt better off of medication. taking Betaseron with symptoms of, aching, and nausea.He stopped Betaseron 06/2011 and  felt better off of medication.MRI study of the brain and cervical spine 09/15/2010 showed multiple supratentorial white matter demyelinating plaques without change versus 01/27/09. Cervical spine showed chronic demyelinating plaques at C3 and T2 with the lesions decreased in size versus  01/27/09. He received IV Solu-Medrol  09/2010 He began Copaxone 08/2011. 12/2011 he noted numbness in his right leg progressing to involve his right hip, knee, and ankle  Dr. Phillips Odor had an MRI of the  lumbar spine showing multilevel degenerative disc disease but no significant canal stenosis and  no significant root compression. There was anteriorlisthesis of  L5 on S1. Patient was seen by Maryan Rued [phone] and placed  the patient on nonsteroidal anti-inflammatory medications. 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 04/12/2012 his symptoms improved. He continued however to have weakness  in his right greater than left leg with Lhermitte's sign He began having spasms In his legs. No electrical shocks in his leg which improved with carbamazepine 200 mg 3 times daily. 05/07/2012 he developed fever, pain in his hips and legs,and quadriparesis.He was admitted to [facility] where CT scan of the chest, abdomen, and pelvis revealed mediastinal and hilar adenopathy and  hepatic steatosis. MRIs showed stable lesions in the brain,cervical,  and thoracic spine and DJD in the lumbar spine. His ACE level was greater than 100 ESR 30, CRP 29, CK 60, HepA and B.,SPEP, ANA, and a, TSH, histo, blood cultures, CMV, Crypto, HIV, and HSV negative. Biopsy of a  mediastinal lymph node showed noncaseating granuloma connsistent with sarcoidosis. He was placed on high-dose prednisone and gradually tapered to his current dose of 5 mg daily. 8/17/213 he was unable to move his legs. He was discharged home 06/16/2012 with home PT. He can stand and use a walker or wheelchair. He requires assistance dressing himself but can bathe himself and  take care of his toileting needs. He has bowel and bladder continence. His walking ability has decreased as the prednisone has been decreased. his swelling in his feet and legs. His weight has gone up to 290 pounds DEXA scan 10/2012 was normal . Has increased tone in the legs.   REVIEW OF SYSTEMS: Full 14 system review of systems performed and notable only for fatigue palpitations swelling or leg trouble swallowing blurred vision shortness of breath wheezing snoring constipation joint pain cramps aching muscles memory loss confusion headache numbness weakness difficulty  swallowing snoring restless legs anxiety decreased energy disinterest in activities racing thoughts.  ALLERGIES: No Known Allergies  HOME MEDICATIONS: Outpatient Prescriptions Prior to Visit  Medication Sig Dispense Refill  . albuterol (PROVENTIL HFA;VENTOLIN HFA) 108 (90 BASE) MCG/ACT inhaler Inhale 2 puffs into the lungs every 6 (six) hours as needed for wheezing.  1 Inhaler  6  . baclofen (LIORESAL) 10 MG tablet Take 1 tablet (10 mg total) by mouth 3 (three) times daily.  90 each  11  . cholecalciferol (VITAMIN D) 1000 UNITS tablet Take 1,000 Units by mouth daily.      . diazepam (VALIUM) 5 MG tablet Take 5 mg by mouth 3 (three) times daily.      Marland Kitchen docusate sodium (COLACE) 100 MG capsule Take 100 mg by mouth daily.       Marland Kitchen glatiramer (COPAXONE) 20 MG/ML injection Inject 40 mg into the skin every 3 (three) days.       Marland Kitchen lidocaine (LIDODERM) 5 % Place 1 patch onto the skin daily. Remove & Discard patch within 12 hours or as directed by MD  30 patch  6  . losartan (COZAAR) 100 MG tablet Take 1 tablet by mouth daily.      Marland Kitchen oxyCODONE-acetaminophen (PERCOCET) 7.5-500  MG per tablet Take 1-2 tablets by mouth every 4 (four) hours as needed for pain.  50 tablet  0  . predniSONE (DELTASONE) 5 MG tablet Take 3 mg by mouth daily.       . insulin aspart (NOVOLOG) 100 UNIT/ML injection Inject 0-15 Units into the skin 3 (three) times daily with meals. As needed      . pantoprazole (PROTONIX) 40 MG tablet Take 1 tablet (40 mg total) by mouth 2 (two) times daily before a meal.       No facility-administered medications prior to visit.    PAST MEDICAL HISTORY: Past Medical History  Diagnosis Date  . Multiple sclerosis 2010  . HTN (hypertension)   . Polysubstance abuse   . Anxiety and depression   . Chronic pain     PAST SURGICAL HISTORY: Past Surgical History  Procedure Laterality Date  . Mediastinoscopy  05/15/2012    Procedure: MEDIASTINOSCOPY;  Surgeon: Loreli Slot, MD;  Location:  [facility] OR;  Service: Thoracic;  Laterality: N/A;  . Cholecystectomy N/A 04/16/2013    Procedure: LAPAROSCOPIC CHOLECYSTECTOMY;  Surgeon: Dalia Heading, MD;  Location: [facility] ORS;  Service: General;  Laterality: N/A;    FAMILY HISTORY: Family History  Problem Relation Age of Onset  . Heart failure Mother   . Stroke Mother   . Heart failure Father   . Cancer Sister   . Seizures Brother     SOCIAL HISTORY:  History   Social History  . Marital Status: Single    Spouse Name: N/A    Number of Children: 0  . Years of Education: HS   Occupational History  . auto technician      disabled    Social History Main Topics  . Smoking status: Former Smoker -- 1.00 packs/day for 30 years    Types: Cigarettes    Quit date: 05/01/2012  . Smokeless tobacco: Never Used  . Alcohol Use: No     Comment: quit 05/01/2012  . Drug Use: Yes    Special: Cocaine, Marijuana     Comment: 05/01/2012 last use  . Sexual Activity: No   Other Topics Concern  . Not on file   Social History Narrative   Patient lives at home with spouse.   Caffeine Use: 3 cups daily     PHYSICAL EXAM  Filed Vitals:   08/03/13 1136  BP: 138/84  Pulse: 53  Height: 6\' 4"  (1.93 m)    Not recorded    There is no weight on file to calculate BMI.  GENERAL EXAM: Patient is in no distress  CARDIOVASCULAR: Regular rate and rhythm, no murmurs, no carotid bruits  NEUROLOGIC: MENTAL STATUS: awake, alert, language fluent, comprehension intact, naming intact CRANIAL NERVE: no papilledema on fundoscopic exam, pupils equal and reactive to light, visual fields full to confrontation, extraocular muscles SHOW MILD DECR LEFT EYE ADDUCTION WITH NYSTAGMUS OF RIGHT EYE ON RIGHT LATERAL GAZE. LEFT GAZE, UP AND DOWN GAZE NORMAL. MILD SACCADIC DYSMETRIA. Facial sensation and strength symmetric, uvula midline, shoulder shrug symmetric, tongue midline. MOTOR: normal bulk and tone IN BUE. FULL STRENGTH IN BUE. INCR TONE IN BLE. BLE 2-3/5.    SENSORY: DECR IN BLE TO ALL MODALITIES COORDINATION: finger-nose-finger, fine finger movements normal REFLEXES: BUE 2, BLE 3.  GAIT/STATION: SPASTIC GAIT. USES A WALKER.   DIAGNOSTIC DATA (LABS, IMAGING, TESTING) - I reviewed patient records, labs, notes, testing and imaging myself where available.  Lab Results  Component Value Date   WBC  16.0* 04/17/2013   HGB 12.7* 04/17/2013   HCT 37.1* 04/17/2013   MCV 88.1 04/17/2013   PLT 236 04/17/2013      Component Value Date/Time   NA 137 04/17/2013 0509   K 4.7 04/17/2013 0509   CL 102 04/17/2013 0509   CO2 27 04/17/2013 0509   GLUCOSE 179* 04/17/2013 0509   BUN 13 04/17/2013 0509   CREATININE 0.93 04/17/2013 0509   CALCIUM 9.6 04/17/2013 0509   PROT 7.2 04/16/2013 1751   ALBUMIN 3.8 04/16/2013 1751   AST 62* 04/16/2013 1751   ALT 116* 04/16/2013 1751   ALKPHOS 83 04/16/2013 1751   BILITOT 0.6 04/16/2013 1751   GFRNONAA >90 04/17/2013 0509   GFRAA >90 04/17/2013 0509   No results found for this basename: CHOL, HDL, LDLCALC, LDLDIRECT, TRIG, CHOLHDL   Lab Results  Component Value Date   HGBA1C 6.0* 04/15/2013   Lab Results  Component Value Date   VITAMINB12 264 05/08/2012   Lab Results  Component Value Date   TSH 0.579 05/08/2012    09/15/10 MRI BRAIN 1. Multiple supratentorial chronic demyelinating plaques. 2. No acute plaques are seen. 3. In comparison to the prior MRI from 01/27/09, there is no significant interval change.  09/15/10 MRI CERVICAL 1. Chronic demyelianting plaques at C3 and T2. 2. No acute plaques. 3. At C3-4 there is moderate right foraminal stenosis. 4. At C6-7 there is severe biforaminal stenosis. 5. In comparison to the prior MRI from 01/27/09, the C3 and T2 lesions have decreased in size.   ASSESSMENT AND PLAN  52 y.o. year old male here with multiple sclerosis, spastic paraplegia, stable on Copaxone. Patient is interested in switching to oral medications. However he has upcoming rheumatology evaluation for  possible worsening of sarcoidosis. I like patient to be stabilized on his sarcoidosis treatment with prednisone. Once this is complete, we may consider transitioning patient to Gilenya or tecfidera.  Regarding spasticity, we could increase baclofen to 20 mg 3 times a day or consider clinical research study with open label extended release baclofen use. He spoke with our research coordinators.   PLAN: -For now, patient will be increased to baclofen 20mg  TID, as the study is not ready for open label enrollment at this time.  Return in about 3 months (around 10/18/2013) for appt already scheduled?Marland Kitchen    Suanne Marker, MD 08/03/2013, 1:14 PM Certified in Neurology, Neurophysiology and Neuroimaging  [facility] [address], [address] [place], [place] [address] [phone]

## 2013-08-03 NOTE — Addendum Note (Signed)
Addended byJoycelyn Schmid on: 08/03/2013 02:30 PM   Modules accepted: Orders

## 2013-08-13 ENCOUNTER — Ambulatory Visit (INDEPENDENT_AMBULATORY_CARE_PROVIDER_SITE_OTHER): Payer: Medicare Other | Admitting: Cardiovascular Disease

## 2013-08-13 ENCOUNTER — Encounter: Payer: Self-pay | Admitting: Cardiovascular Disease

## 2013-08-13 VITALS — BP 150/90 | HR 56 | Ht 75.0 in | Wt 268.7 lb

## 2013-08-13 DIAGNOSIS — J449 Chronic obstructive pulmonary disease, unspecified: Secondary | ICD-10-CM

## 2013-08-13 DIAGNOSIS — I2510 Atherosclerotic heart disease of native coronary artery without angina pectoris: Secondary | ICD-10-CM

## 2013-08-13 DIAGNOSIS — I10 Essential (primary) hypertension: Secondary | ICD-10-CM

## 2013-08-13 DIAGNOSIS — E785 Hyperlipidemia, unspecified: Secondary | ICD-10-CM | POA: Insufficient documentation

## 2013-08-13 DIAGNOSIS — G4733 Obstructive sleep apnea (adult) (pediatric): Secondary | ICD-10-CM

## 2013-08-13 DIAGNOSIS — I872 Venous insufficiency (chronic) (peripheral): Secondary | ICD-10-CM | POA: Insufficient documentation

## 2013-08-13 DIAGNOSIS — G35 Multiple sclerosis: Secondary | ICD-10-CM

## 2013-08-13 NOTE — Patient Instructions (Signed)
Your physician recommends that you schedule a follow-up appointment in: 1 YEAR. No changes were made today in your therapy. 

## 2013-08-13 NOTE — Progress Notes (Signed)
Patient ID: Chad Vasquez, male   DOB: 1960/11/20, 52 y.o.   MRN: [id number]     HPI: Chad Vasquez is a 52 y.o. male presents to the office for one year cardiology evaluation.  Chad Vasquez is now 52 years old. He has a history of mild nonobstructive coronary artery disease which was noted at cardiac catheterization in 2005. He has a history of hypertension, high Bernardini a, and currently is using CPAP therapy for his obstructive sleep apnea. He has a long-standing history of multiple sclerosis which in the past had been followed by Dr. love and most recently is followed by Dr. Clovis Pu and Dr. Tressie Stalker Dr. love's retirement. The patient walks with a walker. Occasionally uses a wheelchair. He does note rare palpitations. He denies recent episodes of chest pain. He does have a history of venous insufficiency and is status post venous ablation at times he still notes an occasional lotion and edema. He has lost approximately 20 pounds as his dose of prednisone has been reduced he does have diabetes and takes insulin on an as-needed basis keeping up on his blood sugar but does not routinely take a daily regimen. He tells me Dr. Marjory Lies may switch him to a newer multiple sclerosis agent and stop his Copaxone.  Past Medical History  Diagnosis Date  . Multiple sclerosis 2010  . HTN (hypertension)   . Polysubstance abuse   . Anxiety and depression   . Chronic pain     Past Surgical History  Procedure Laterality Date  . Mediastinoscopy  05/15/2012    Procedure: MEDIASTINOSCOPY;  Surgeon: Loreli Slot, MD;  Location: [facility] OR;  Service: Thoracic;  Laterality: N/A;  . Cholecystectomy N/A 04/16/2013    Procedure: LAPAROSCOPIC CHOLECYSTECTOMY;  Surgeon: Dalia Heading, MD;  Location: [facility];  Service: General;  Laterality: N/A;    No Known Allergies  Current Outpatient Prescriptions  Medication Sig Dispense Refill  . albuterol (PROVENTIL HFA;VENTOLIN HFA) 108 (90 BASE) MCG/ACT inhaler  Inhale 2 puffs into the lungs every 6 (six) hours as needed for wheezing.  1 Inhaler  6  . baclofen (LIORESAL) 20 MG tablet Take 1 tablet (20 mg total) by mouth 3 (three) times daily.  90 tablet  11  . cholecalciferol (VITAMIN D) 1000 UNITS tablet Take 1,000 Units by mouth daily.      . diazepam (VALIUM) 5 MG tablet Take 5 mg by mouth 3 (three) times daily.      Marland Kitchen docusate sodium (COLACE) 100 MG capsule Take 100 mg by mouth daily.       Marland Kitchen glatiramer (COPAXONE) 20 MG/ML injection Inject 40 mg into the skin every 3 (three) days.       . insulin aspart (NOVOLOG) 100 UNIT/ML injection As needed      . lidocaine (LIDODERM) 5 % Place 1 patch onto the skin daily. Remove & Discard patch within 12 hours or as directed by MD  30 patch  6  . losartan (COZAAR) 100 MG tablet Take 1 tablet by mouth daily.      Marland Kitchen oxyCODONE-acetaminophen (PERCOCET) 7.5-500 MG per tablet Take 1-2 tablets by mouth every 4 (four) hours as needed for pain.  50 tablet  0  . pantoprazole (PROTONIX) 40 MG tablet Take 40 mg by mouth daily.      . predniSONE (DELTASONE) 5 MG tablet Take 3 mg by mouth daily.        No current facility-administered medications for this visit.    History  Social History  . Marital Status: Single    Spouse Name: N/A    Number of Children: 0  . Years of Education: HS   Occupational History  . auto technician      disabled    Social History Main Topics  . Smoking status: Former Smoker -- 1.00 packs/day for 30 years    Types: Cigarettes    Quit date: 05/01/2012  . Smokeless tobacco: Never Used  . Alcohol Use: No     Comment: quit 05/01/2012  . Drug Use: Yes    Special: Cocaine, Marijuana     Comment: 05/01/2012 last use  . Sexual Activity: No   Other Topics Concern  . Not on file   Social History Narrative   Patient lives at home with spouse.   Caffeine Use: 3 cups daily    Family History  Problem Relation Age of Onset  . Heart failure Mother   . Stroke Mother   . Heart failure  Father   . Cancer Sister   . Seizures Brother     ROS is negative for fevers, chills or night sweats. Admits to at least a 20 pound weight loss. He denies visual symptoms. Denies cough or sputum production. He admits to using his CPAP. He denies wheezing. He denies anginal symptoms. He denies blood in his stool or urine. He denies GERD symptoms but does take proton next. He does have difficulty with ambulation and needs help moving his legs. He does note some intermittent leg swelling. He denies tremors. He quit tobacco approximately one year ago. Other comprehensive 12 point system review is negative.  PE BP 150/90  Pulse 56  Ht 6\' 3"  (1.905 m)  Wt 268 lb 11.2 oz (121.882 kg)  BMI 33.59 kg/m2  General: Alert, oriented, no distress.  Skin: normal turgor, no rashes HEENT: Normocephalic, atraumatic. Pupils round and reactive; sclera anicteric;no lid lag.  Nose without nasal septal hypertrophy Mouth/Parynx benign; Mallinpatti scale 3 Neck: No JVD, no carotid briuts Lungs: clear to ausculatation and percussion; no wheezing or rales Heart: RRR, s1 s2 normal 1/6 sem Abdomen: soft, nontender; no hepatosplenomehaly, BS+; abdominal aorta nontender and not dilated by palpation. Pulses 2+ Extremities: Trivial edema at his right ankle; no clubbing cyanosis or edema, Homan's sign negative  Neurologic: grossly nonfocal; cranial nerves intact; reduced motor strength his lower extremities. no clonus Psychologic: normal affect and mood.  ECG: Sinus bradycardia 56 beats per minute. Normal intervals. No ectopy.  LABS:  BMET    Component Value Date/Time   NA 137 04/17/2013 0509   K 4.7 04/17/2013 0509   CL 102 04/17/2013 0509   CO2 27 04/17/2013 0509   GLUCOSE 179* 04/17/2013 0509   BUN 13 04/17/2013 0509   CREATININE 0.93 04/17/2013 0509   CALCIUM 9.6 04/17/2013 0509   GFRNONAA >90 04/17/2013 0509   GFRAA >90 04/17/2013 0509     Hepatic Function Panel     Component Value Date/Time   PROT 7.2  04/16/2013 1751   ALBUMIN 3.8 04/16/2013 1751   AST 62* 04/16/2013 1751   ALT 116* 04/16/2013 1751   ALKPHOS 83 04/16/2013 1751   BILITOT 0.6 04/16/2013 1751   BILIDIR 0.2 04/16/2013 1751   IBILI 0.4 04/16/2013 1751     CBC    Component Value Date/Time   WBC 16.0* 04/17/2013 0509   RBC 4.21* 04/17/2013 0509   HGB 12.7* 04/17/2013 0509   HCT 37.1* 04/17/2013 0509   PLT 236 04/17/2013 0509   MCV  88.1 04/17/2013 0509   MCH 30.2 04/17/2013 0509   MCHC 34.2 04/17/2013 0509   RDW 13.2 04/17/2013 0509   LYMPHSABS 1.7 04/15/2013 0750   MONOABS 0.6 04/15/2013 0750   EOSABS 0.1 04/15/2013 0750   BASOSABS 0.0 04/15/2013 0750     BNP No results found for this basename: probnp    Lipid Panel  No results found for this basename: chol, trig, hdl, cholhdl, vldl, ldlcalc     RADIOLOGY: Dg Chest 2 View  07/26/2013   CLINICAL DATA:  Chronic obstructive pulmonary disease.  EXAM: CHEST  2 VIEW  COMPARISON:  April 15, 2013.  FINDINGS: The heart size and mediastinal contours are within normal limits. Hyperexpansion of the lungs is noted consistent with chronic obstructive pulmonary disease. Both lungs are clear. The visualized skeletal structures are unremarkable.  IMPRESSION: Chronic obstructive pulmonary disease. No acute cardiopulmonary abnormality seen.   Electronically Signed   By: Roque Lias M.D.   On: 07/26/2013 13:33      ASSESSMENT AND PLAN: Chad [place] is 52 years old. In 2005 he underwent cardiac catheterization by Dr. Chales Abrahams and was found to have mild 30% narrowing in the mid AV groove the circumflex, and 30 and 50% RCA stenoses. He has documented normal LV function. Over the past year he has lost approximately 22 pounds as his prednisone dose has been reduced. As a result he now is not taking insulin regularly. He does walk mostly with a walker. He quit tobacco one year ago. He does know where palpitations. Presently his blood pressure is controlled. Encouraged further weight loss if possible.  He does have residual trace edema in his right lower extremity but is markedly improved following venous ablation. From a cardiac standpoint I believe he is stable. I'll see him in one year for followup evaluation or sooner times arise.     Lennette Bihari, MD, [facility]  08/13/2013 1:03 PM

## 2013-08-14 ENCOUNTER — Encounter: Payer: Self-pay | Admitting: Cardiovascular Disease

## 2013-08-16 ENCOUNTER — Telehealth: Payer: Self-pay | Admitting: Diagnostic Neuroimaging

## 2013-08-16 NOTE — Telephone Encounter (Signed)
Patient said that he is still having leg cramps(lower),really stiff, extreme pain, try to get up fast to stand but has fallen a couple of times.  It is taking a toll on him, not getting proper rest,has been waiting to take last dosage between 11-11:30, not helping

## 2013-08-16 NOTE — Telephone Encounter (Signed)
Fwd: Dr. Marjory Lies

## 2013-08-20 ENCOUNTER — Telehealth: Payer: Self-pay | Admitting: Diagnostic Neuroimaging

## 2013-08-24 NOTE — Telephone Encounter (Signed)
Dr. Exie Parody. Pt calling again, re: spasicity. (LM this am 0947)

## 2013-08-27 NOTE — Telephone Encounter (Signed)
I called and got no reply and left message on answering machine

## 2013-08-28 ENCOUNTER — Telehealth: Payer: Self-pay | Admitting: Neurology

## 2013-08-28 MED ORDER — TIZANIDINE HCL 2 MG PO CAPS: 2.0000 mg | ORAL_CAPSULE | Freq: Three times a day (TID) | ORAL | Status: DC | PRN

## 2013-08-28 NOTE — Telephone Encounter (Signed)
Patient called with concern of severe LE muscle spasms, most notably in bilateral gastrocnemius. Baclofen 80mg  daily has not been helping. Will try zanaflex 2mg  TID as needed. Patient instructed to taper baclofen down to 60mg  and if needed down to 40mg  daily to limit excessive fatigue. Could consider Botox therapy to gastrocnemius muscles in the future for treatment of spasticity if indicated.

## 2013-08-28 NOTE — Telephone Encounter (Signed)
Patient called and concerns addressed. Please see phone note.

## 2013-08-28 NOTE — Telephone Encounter (Signed)
Pt calling again.  Having problem with spasicity and is taking baclofen which is not helping.   Dr. Pearlean Brownie attempted to call yesterday.  Will send to Dr. Rhae Hammock

## 2013-09-03 NOTE — Telephone Encounter (Signed)
Dr Marjory Lies to address upon his return

## 2013-09-05 ENCOUNTER — Telehealth: Payer: Self-pay | Admitting: Neurology

## 2013-09-05 NOTE — Telephone Encounter (Signed)
Patient left message that he continues to have spasticity and cramps in his legs.  The medicine prescribed "is not the right thing for me."  Please advice if anything can be done.

## 2013-09-06 ENCOUNTER — Encounter: Payer: Self-pay | Admitting: Neurology

## 2013-09-06 NOTE — Telephone Encounter (Signed)
Spoke to Dr. Marjory Lies and he would like patient to come in for an appointment with all his medications.  I spoke with patient and told him Cassandra would call him tomorrow at his home number to make an appointment.  He was very grateful.

## 2013-09-06 NOTE — Telephone Encounter (Signed)
Tried to reach patient on home and mobile #'s to schedule appt. No answer on hp#. Unable to leave vmail on mobile#.

## 2013-09-07 ENCOUNTER — Encounter: Payer: Self-pay | Admitting: Neurology

## 2013-09-07 NOTE — Telephone Encounter (Signed)
Spoke to patient. Scheduled appt

## 2013-09-11 ENCOUNTER — Encounter: Payer: Self-pay | Admitting: Neurology

## 2013-09-14 ENCOUNTER — Encounter (INDEPENDENT_AMBULATORY_CARE_PROVIDER_SITE_OTHER): Payer: Self-pay

## 2013-09-14 ENCOUNTER — Telehealth: Payer: Self-pay | Admitting: Diagnostic Neuroimaging

## 2013-09-14 ENCOUNTER — Ambulatory Visit (INDEPENDENT_AMBULATORY_CARE_PROVIDER_SITE_OTHER): Payer: Medicare Other | Admitting: Diagnostic Neuroimaging

## 2013-09-14 ENCOUNTER — Encounter: Payer: Self-pay | Admitting: Diagnostic Neuroimaging

## 2013-09-14 VITALS — BP 122/73 | HR 63 | Temp 98.7°F

## 2013-09-14 DIAGNOSIS — G35 Multiple sclerosis: Secondary | ICD-10-CM

## 2013-09-14 DIAGNOSIS — R269 Unspecified abnormalities of gait and mobility: Secondary | ICD-10-CM

## 2013-09-14 DIAGNOSIS — R252 Cramp and spasm: Secondary | ICD-10-CM

## 2013-09-14 DIAGNOSIS — R259 Unspecified abnormal involuntary movements: Secondary | ICD-10-CM

## 2013-09-14 MED ORDER — TIZANIDINE HCL 2 MG PO CAPS: 2.0000 mg | ORAL_CAPSULE | Freq: Three times a day (TID) | ORAL | Status: DC | PRN

## 2013-09-14 NOTE — Telephone Encounter (Signed)
Please advise 

## 2013-09-14 NOTE — Telephone Encounter (Signed)
pt checking out wants to know if he can have a home health aid please call

## 2013-09-14 NOTE — Progress Notes (Signed)
[facility]  PATIENT: Chad Vasquez DOB: 03/10/2015  REFERRING CLINICIAN:  HISTORY FROM: patient and wife REASON FOR VISIT: follow up   HISTORICAL  CHIEF COMPLAINT:  No chief complaint on file.   HISTORY OF PRESENT ILLNESS:   UPDATE 54/12/14: Since last visit patient tried baclofen 20 mg 4 times per day without relief of spasms. Patient tried tizanidine 2 mg tablet x1, and then he fell asleep for 4 hours. He did not try any further tizanidine since that time. Patient continues on diazepam 5 mg 3 times a day. Overall he feels his muscle spasms and spasticity legs are increasing over time. Patient continues on Copaxone for disease modifying therapy. Regarding his sarcoidosis he is on prednisone taper dose and doing well. His main problem nowadays is decreased mobility, balance difficulty, like spasms and leg pain.  UPDATE 54/31/14: Since last visit patient complains of increasing cramps, stiffness, pain and spasticity in the legs. This typically affects him when he lays down at nighttime and to go to sleep. Sometimes it wakes him up out of sleep. Patient tells me that his sarcoidosis might be flaring up based on recent imaging, and he has followup with rheumatology to determine prednisone dosing.  UPDATE 54/28/14 (LL): [name] for work-in appointment, having increased bilateral leg weakness and pain in right leg since cholecystectomy on July 14. States he was getting around pretty well before that, able to walk with a walker. Now he feels like his legs "are stuck in the mud" and he cannot pick them up. He is having to pick the legs up with his arms to adjust them in the wheelchair. He is not getting any PT but does regular exercises at home that PT taught his previously. He would rather not take any additional prednisone if he can avoid it. He is currently on Prednisone 5mg  daily, and Dr. Dierdre Forth hopes to wean it to off. He denies any problems with bowel  or bladder. He has "cold" pain from the knee down on the right leg and c/o burning "bee-sting" pain in the right lateral thigh. He does not drive since 52, a lady friend helps him to appointments.  PRIOR HPI (11/28/12, Dr. Sandria Manly): 52 year old left-handed white single male from [place], [place] who developed left leg weakness beginning in 2007 with difficulty putting weight on his left leg. He would get weak for a period of time then  he would be "okay" for 4-5 months.He could go a year between episodes. He continued to work.At the end of 2008 he noted difficulty walking that was persistent. 01/2009 he got up one morning and couldn't walk. He went to [facility] and was admitted. CAT scan of the brain 01/26/2009 showed hypoattenuation in the subcortical periventricular white matter consistent with chronic microvascular disease or MS. MRI study of the brain and cervical spine 01/27/2009 showed premature atrophy with numerous white matter lesions in the brain suggestive of chronic MS. There was a  lesion at C3-4 in the spinal cord in the right hemicord consistent with the diagnosis of multiple sclerosis. There was also an area at T2-3, suspicious of a lesion. No lesions enhanced. I reviewed these studies. LP was performed 4/27 with glucose 78, WBC 6, RBC 3, increased IgG synthesis of 15.2, increased IgG index, and greater than 5 oligoclonal bands. Urine test was positive for cocaine. Blood studies were negative for B12, RPR,homocysteine, and TSH. He went on generic Betaseron injections every other  day beginning 03/2009. He was switched to [facility] 12/2009.I saw him 02/09/10 and obtained ANA, ACE, HIV, lyme titer, and RA factor which were negative. He has never seen an ophthalmologist or optometrist. He felt badly when he began taking Betaseron with symptoms of, aching, and nausea.He stopped Betaseron 06/2011 and  felt better off of medication.MRI study of the brain and cervical spine  09/15/2010 showed multiple supratentorial white matter demyelinating plaques without change versus 01/27/09. Cervical spine showed chronic demyelinating plaques at C3 and T2 with the lesions decreased in size versus 01/27/09. He received IV Solu-Medrol  09/2010 He began Copaxone 08/2011. 12/2011 he noted numbness in his right leg progressing to involve his right hip, knee, and ankle  Dr. Phillips Odor had an MRI of the  lumbar spine showing multilevel degenerative disc disease but no significant canal stenosis and  no significant root compression. There was anteriorlisthesis of L5 on S1. Patient was seen by Maryan Rued [phone] and placed  the patient on nonsteroidal anti-inflammatory medications. 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 04/12/2012 his symptoms improved. He continued however to have weakness  in his right greater than left leg with Lhermitte's sign He began having spasms In his legs. No electrical shocks in his leg which improved with carbamazepine 200 mg 3 times daily. 05/07/2012 he developed fever, pain in his hips and legs,and quadriparesis.He was admitted to [facility] where CT scan of the chest, abdomen, and pelvis revealed mediastinal and hilar adenopathy and  hepatic steatosis. MRIs showed stable lesions in the brain,cervical,  and thoracic spine and DJD in the lumbar spine. His ACE level was greater than 100 ESR 30, CRP 29, CK 60, HepA and B.,SPEP, ANA, and a, TSH, histo, blood cultures, CMV, Crypto, HIV, and HSV negative. Biopsy of a  mediastinal lymph node showed noncaseating granuloma connsistent with sarcoidosis. He was placed on high-dose prednisone and gradually tapered to his current dose of 5 mg daily. 8/17/213 he was unable to move his legs. He was discharged home 06/16/2012 with home PT. He can stand and use a walker or wheelchair. He requires assistance dressing himself but can bathe himself and  take  care of his toileting needs. He has bowel and bladder continence. His walking ability has decreased as the prednisone has been decreased. his swelling in his feet and legs. His weight has gone up to 290 pounds DEXA scan 10/2012 was normal . Has increased tone in the legs.   REVIEW OF SYSTEMS: Full 14 system review of systems performed and notable only for decreased activity decreased appetite remain years total swelling leg swelling daytime sleepiness snoring or difficult walking difficulty cramps aching muscle back pain joint pain numbness headache dizziness memory loss she declines cold intolerance.   ALLERGIES: No Active Allergies  HOME MEDICATIONS: Outpatient Prescriptions Prior to Visit  Medication Sig Dispense Refill  . albuterol (PROVENTIL HFA;VENTOLIN HFA) 108 (90 BASE) MCG/ACT inhaler Inhale 2 puffs into the lungs every 6 (six) hours as needed for wheezing.  1 Inhaler  6  . baclofen (LIORESAL) 20 MG tablet Take 1 tablet (20 mg total) by mouth 3 (three) times daily.  90 tablet  11  . diazepam (VALIUM) 5 MG tablet Take 5 mg by mouth 3 (three) times daily.      Marland Kitchen docusate sodium (COLACE) 100 MG capsule Take 100 mg by mouth daily.       Marland Kitchen glatiramer (COPAXONE)  20 MG/ML injection Inject 40 mg into the skin. M., W., F      . losartan (COZAAR) 100 MG tablet Take 1 tablet by mouth daily.      Marland Kitchen lidocaine (LIDODERM) 5 % Place 1 patch onto the skin daily. Remove & Discard patch within 12 hours or as directed by MD  30 patch  6  . pantoprazole (PROTONIX) 40 MG tablet Take 40 mg by mouth daily.      . cholecalciferol (VITAMIN D) 1000 UNITS tablet Take 1,000 Units by mouth daily.      . insulin aspart (NOVOLOG) 100 UNIT/ML injection As needed      . oxyCODONE-acetaminophen (PERCOCET) 7.5-500 MG per tablet Take 1-2 tablets by mouth every 4 (four) hours as needed for pain.  50 tablet  0  . predniSONE (DELTASONE) 5 MG tablet Take 3 mg by mouth daily.       . tizanidine (ZANAFLEX) 2 MG capsule Take 1  capsule (2 mg total) by mouth 3 (three) times daily as needed for muscle spasms.  60 capsule  3   No facility-administered medications prior to visit.    PAST MEDICAL HISTORY: Past Medical History  Diagnosis Date  . Multiple sclerosis 2010  . HTN (hypertension)   . Polysubstance abuse   . Anxiety and depression   . Chronic pain     PAST SURGICAL HISTORY: Past Surgical History  Procedure Laterality Date  . Mediastinoscopy  05/15/2012    Procedure: MEDIASTINOSCOPY;  Surgeon: Loreli Slot, MD;  Location: [facility] OR;  Service: Thoracic;  Laterality: N/A;  . Cholecystectomy N/A 04/16/2013    Procedure: LAPAROSCOPIC CHOLECYSTECTOMY;  Surgeon: Dalia Heading, MD;  Location: [facility]S;  Service: General;  Laterality: N/A;    FAMILY HISTORY: Family History  Problem Relation Age of Onset  . Heart failure Mother   . Stroke Mother   . Heart failure Father   . Cancer Sister   . Seizures Brother     SOCIAL HISTORY:  History   Social History  . Marital Status: Single    Spouse Name: N/A    Number of Children: 0  . Years of Education: HS   Occupational History  . auto technician      disabled    Social History Main Topics  . Smoking status: Former Smoker -- 1.00 packs/day for 30 years    Types: Cigarettes    Quit date: 05/01/2012  . Smokeless tobacco: Never Used  . Alcohol Use: No     Comment: quit 05/01/2012  . Drug Use: Yes    Special: Cocaine, Marijuana     Comment: 05/01/2012 last use  . Sexual Activity: No   Other Topics Concern  . Not on file   Social History Narrative   Patient lives at home with spouse.   Caffeine Use: 3 cups daily     PHYSICAL EXAM  Filed Vitals:   09/14/13 1004  BP: 122/73  Pulse: 63  Temp: 98.7 F (37.1 C)  TempSrc: Oral    Not recorded    Cannot calculate BMI with a height equal to zero.  GENERAL EXAM: Patient is in no distress  CARDIOVASCULAR: Regular rate and rhythm, no murmurs, no carotid bruits  NEUROLOGIC: MENTAL  STATUS: awake, alert, language fluent, comprehension intact, naming intact CRANIAL NERVE: no papilledema on fundoscopic exam, pupils equal and reactive to light, visual fields full to confrontation, extraocular muscles SHOW MILD DECR LEFT EYE ADDUCTION WITH NYSTAGMUS OF RIGHT EYE ON RIGHT LATERAL  GAZE. LEFT GAZE, UP AND DOWN GAZE NORMAL. MILD SACCADIC DYSMETRIA. Facial sensation and strength symmetric, uvula midline, shoulder shrug symmetric, tongue midline. MOTOR: normal bulk IN BUE. SIGNIFICANT SPASTICITY IN BLE. FULL STRENGTH IN BUE.  BLE 2/5.  SENSORY: DECR IN BLE TO ALL MODALITIES COORDINATION: finger-nose-finger, fine finger movements normal REFLEXES: BUE 2, BLE 3.  GAIT/STATION: IN WHEEL CHAIR. CANNOT STAND.   DIAGNOSTIC DATA (LABS, IMAGING, TESTING) - I reviewed patient records, labs, notes, testing and imaging myself where available.  Lab Results  Component Value Date   WBC 16.0* 04/17/2013   HGB 12.7* 04/17/2013   HCT 37.1* 04/17/2013   MCV 88.1 04/17/2013   PLT 236 04/17/2013      Component Value Date/Time   NA 137 04/17/2013 0509   K 4.7 04/17/2013 0509   CL 102 04/17/2013 0509   CO2 27 04/17/2013 0509   GLUCOSE 179* 04/17/2013 0509   BUN 13 04/17/2013 0509   CREATININE 0.93 04/17/2013 0509   CALCIUM 9.6 04/17/2013 0509   PROT 7.2 04/16/2013 1751   ALBUMIN 3.8 04/16/2013 1751   AST 62* 04/16/2013 1751   ALT 116* 04/16/2013 1751   ALKPHOS 83 04/16/2013 1751   BILITOT 0.6 04/16/2013 1751   GFRNONAA >90 04/17/2013 0509   GFRAA >90 04/17/2013 0509   No results found for this basename: CHOL,  HDL,  LDLCALC,  LDLDIRECT,  TRIG,  CHOLHDL   Lab Results  Component Value Date   HGBA1C 6.0* 04/15/2013   Lab Results  Component Value Date   VITAMINB12 264 05/08/2012   Lab Results  Component Value Date   TSH 0.579 05/08/2012    09/15/10 MRI BRAIN 1. Multiple supratentorial chronic demyelinating plaques. 2. No acute plaques are seen. 3. In comparison to the prior MRI from 01/27/09, there is  no significant interval change.  09/15/10 MRI CERVICAL 1. Chronic demyelianting plaques at C3 and T2. 2. No acute plaques. 3. At C3-4 there is moderate right foraminal stenosis. 4. At C6-7 there is severe biforaminal stenosis. 5. In comparison to the prior MRI from 01/27/09, the C3 and T2 lesions have decreased in size.   ASSESSMENT AND PLAN  51 y.o. year old male here with multiple sclerosis, spastic paraplegia, getting worse on Copaxone, mainly with worsening spasticity.   PLAN: - continue baclofen 20mg  TID-QID - retry of tizanidine 2mg  at bedtime (previously cause sig daytime sedation) - MRI brain and c-spine - labs - may consider gilenya, tecfidera or tysabri at next visit  Orders Placed This Encounter  Procedures  . MR Brain W Wo Contrast  . MR Cervical Spine W Wo Contrast  . Stratify JCV Antibody Test ([facility])  . CBC With differential/Platelet  . Comprehensive metabolic panel    Return for with Stephanny Tsutsui. Feb 2015.   Suanne Marker, MD 09/14/2013, 11:19 AM Certified in Neurology, Neurophysiology and Neuroimaging  [facility] [address], [address] [place], [place] [address] [phone]

## 2013-09-14 NOTE — Patient Instructions (Signed)
Try tizanidine 2mg  at bedtime.  Then gradually increase to twice a day after 2 weeks.

## 2013-09-18 ENCOUNTER — Encounter: Payer: Self-pay | Admitting: Neurology

## 2013-10-02 ENCOUNTER — Ambulatory Visit
Admission: RE | Admit: 2013-10-02 | Discharge: 2013-10-02 | Disposition: A | Discharge status: Home / Self Care | Payer: Medicare Other | Source: Ambulatory Visit | Attending: Diagnostic Neuroimaging | Admitting: Diagnostic Neuroimaging

## 2013-10-02 DIAGNOSIS — R269 Unspecified abnormalities of gait and mobility: Secondary | ICD-10-CM

## 2013-10-02 DIAGNOSIS — R252 Cramp and spasm: Secondary | ICD-10-CM

## 2013-10-02 DIAGNOSIS — G35 Multiple sclerosis: Secondary | ICD-10-CM

## 2013-10-02 MED ORDER — GADOBENATE DIMEGLUMINE 529 MG/ML IV SOLN
20.0000 mL | Freq: Once | INTRAVENOUS | Status: AC | PRN
  Administered 2013-10-02: 20 mL via INTRAVENOUS

## 2013-11-05 ENCOUNTER — Encounter (INDEPENDENT_AMBULATORY_CARE_PROVIDER_SITE_OTHER): Payer: Self-pay

## 2013-11-05 ENCOUNTER — Encounter: Payer: Self-pay | Admitting: Diagnostic Neuroimaging

## 2013-11-05 ENCOUNTER — Ambulatory Visit (INDEPENDENT_AMBULATORY_CARE_PROVIDER_SITE_OTHER): Payer: Medicare Other | Admitting: Diagnostic Neuroimaging

## 2013-11-05 VITALS — BP 130/74 | HR 65 | Temp 98.0°F | Ht 75.0 in | Wt 270.0 lb

## 2013-11-05 DIAGNOSIS — G35 Multiple sclerosis: Secondary | ICD-10-CM

## 2013-11-05 NOTE — Progress Notes (Signed)
[facility]  PATIENT: Chad Vasquez DOB: 23-Sep-1961  REFERRING CLINICIAN:  HISTORY FROM: patient REASON FOR VISIT: follow up   HISTORICAL  CHIEF COMPLAINT:  No chief complaint on file.   HISTORY OF PRESENT ILLNESS:   UPDATE 11/05/13: Since last visit, tried tizanidine, which helped for a while, but then started wearing off. Now on combination of oxycodone and diazepam which helps. Reviewed labs and MRI results.   UPDATE 09/14/13: Since last visit patient tried baclofen 20 mg 4 times per day without relief of spasms. Patient tried tizanidine 2 mg tablet x1, and then he fell asleep for 4 hours. He did not try any further tizanidine since that time. Patient continues on diazepam 5 mg 3 times a day. Overall he feels his muscle spasms and spasticity legs are increasing over time. Patient continues on Copaxone for disease modifying therapy. Regarding his sarcoidosis he is on prednisone taper dose and doing well. His main problem nowadays is decreased mobility, balance difficulty, like spasms and leg pain.  UPDATE 08/03/13: Since last visit patient complains of increasing cramps, stiffness, pain and spasticity in the legs. This typically affects him when he lays down at nighttime and to go to sleep. Sometimes it wakes him up out of sleep. Patient tells me that his sarcoidosis might be flaring up based on recent imaging, and he has followup with rheumatology to determine prednisone dosing.  UPDATE 04/30/13 ([facility]): [name] for work-in appointment, having increased bilateral leg weakness and pain in right leg since cholecystectomy on July 14. States he was getting around pretty well before that, able to walk with a walker. Now he feels like his legs "are stuck in the mud" and he cannot pick them up. He is having to pick the legs up with his arms to adjust them in the wheelchair. He is not getting any PT but does regular exercises at home that PT taught his  previously. He would rather not take any additional prednisone if he can avoid it. He is currently on Prednisone 62m daily, and Chad [facility]quez to wean it to off. He denies any problems with bowel or bladder. He has "cold" pain from the knee down on the right leg and c/o burning "bee-sting" pain in the right lateral thigh. He does not drive since [id number] a lady friend helps him to appointments.  PRIOR HPI (11/28/12, Chad Vasquez: 53year [place] left[place] leg weakness beginning in 2007 with difficulty putting weight on his left leg. He would get weak for a period of time then  he would be "okay" for 4-5 months.He could go a year between episodes. He continued to work.At the end of 2008 he noted difficulty walking that was persistent. 01/2009 he got up one morning and couldn't walk. [facility]. CAT scan of the brain 01/26/2009 showed hypoattenuation in the subcortical periventricular white matter consistent with chronic microvascular disease or MS. MRI study of the brain and cervical spine 01/27/2009 showed premature atrophy with numerous white matter lesions in the brain suggestive of chronic MS. There was a  lesion at C3-4 in the spinal cord in the right hemicord consistent with the diagnosis of multiple sclerosis. There was also an area at T2-3, suspicious of a lesion. No lesions enhanced. I reviewed these studies. LP was performed 4/27 with glucose 78, WBC 6, RBC 3, increased IgG synthesis of 15.2, increased  IgG index, and greater than 5 oligoclonal bands. Urine test was positive for cocaine. Blood studies were negative for B12, RPR,homocysteine, and TSH. He went on generic Betaseron injections every other day beginning 03/2009. He was switched to [facility] 12/2009.I saw him 02/09/10 and obtained ANA, ACE, HIV, lyme titer, and RA factor which were negative. He has never seen an ophthalmologist or  optometrist. He felt badly when he began taking Betaseron with symptoms of, aching, and nausea.He stopped Betaseron 06/2011 and  felt better off of medication.MRI study of the brain and cervical spine 09/15/2010 showed multiple supratentorial white matter demyelinating plaques without change versus 01/27/09. Cervical spine showed chronic demyelinating plaques at C3 and T2 with the lesions decreased in size versus 01/27/09. He received IV Solu-Medrol  09/2010 He began Copaxone 08/2011. 12/2011 he noted numbness in his right leg progressing to involve his right hip, knee, and ankle  Dr. Hilma Vasquez had an MRI of the  lumbar spine showing multilevel degenerative disc disease but no significant canal stenosis and  no significant root compression. There was anteriorlisthesis of L5 on S1. Patient was seen by Chad Vasquez [phone] and placed  the patient on nonsteroidal anti-inflammatory medications. 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 04/12/2012 his symptoms improved. He continued however to have weakness  in his right greater than left leg with Lhermitte's sign He began having spasms In his legs. No electrical shocks in his leg which improved with carbamazepine 200 mg 3 times daily. 05/07/2012 he developed fever, pain in his hips and legs,and quadriparesis.He was admitted to [facility] where CT scan of the chest, abdomen, and pelvis revealed mediastinal and hilar adenopathy and  hepatic steatosis. MRIs showed stable lesions in the brain,cervical,  and thoracic spine and DJD in the lumbar spine. His ACE level was greater than 100 ESR 30, CRP 29, CK 60, HepA and B.,SPEP, ANA, and a, TSH, histo, blood cultures, CMV, Crypto, HIV, and HSV negative. Biopsy of a  mediastinal lymph node showed noncaseating granuloma connsistent with sarcoidosis. He was placed on high-dose prednisone and gradually tapered to his current dose of 5 mg daily. 8/17/213  he was unable to move his legs. He was discharged home 06/16/2012 with home PT. He can stand and use a walker or wheelchair. He requires assistance dressing himself but can bathe himself and  take care of his toileting needs. He has bowel and bladder continence. His walking ability has decreased as the prednisone has been decreased. his swelling in his feet and legs. His weight has gone up to 290 pounds DEXA scan 10/2012 was normal . Has increased tone in the legs.   REVIEW OF SYSTEMS: Full 14 system review of systems performed and notable only for decreased activity decreased appetite remain years total swelling leg swelling daytime sleepiness snoring difficult walking difficulty cramps aching muscle back pain joint pain numbness headache dizziness memory loss cold intolerance agitation.   ALLERGIES: No Known Allergies  HOME MEDICATIONS: Outpatient Prescriptions Prior to Visit  Medication Sig Dispense Refill  . albuterol (PROVENTIL HFA;VENTOLIN HFA) 108 (90 BASE) MCG/ACT inhaler Inhale 2 puffs into the lungs every 6 (six) hours as needed for wheezing.  1 Inhaler  6  . baclofen (LIORESAL) 20 MG tablet Take 1 tablet (20 mg total) by mouth 3 (three) times daily.  90 tablet  11  . Cholecalciferol (VITAMIN D3) 2000 UNITS TABS Take 1 tablet by mouth daily.      Marland Kitchen  diazepam (VALIUM) 5 MG tablet Take 5 mg by mouth 3 (three) times daily.      Marland Kitchen docusate sodium (COLACE) 100 MG capsule Take 100 mg by mouth daily.       Marland Kitchen glatiramer (COPAXONE) 20 MG/ML injection Inject 40 mg into the skin. M., W., F      . lidocaine (LIDODERM) 5 % Place 1 patch onto the skin as needed. Remove & Discard patch within 12 hours or as directed by MD      . losartan (COZAAR) 100 MG tablet Take 1 tablet by mouth daily.      Marland Kitchen oxyCODONE-acetaminophen (PERCOCET) 7.5-325 MG per tablet Take 1 tablet by mouth every 4 (four) hours as needed for pain.      . pantoprazole (PROTONIX) 40 MG tablet Take 40 mg by mouth daily.      . predniSONE  (DELTASONE) 1 MG tablet Take 1 mg by mouth daily with breakfast.      . tizanidine (ZANAFLEX) 2 MG capsule Take 1 capsule (2 mg total) by mouth 3 (three) times daily as needed for muscle spasms.  90 capsule  3   No facility-administered medications prior to visit.    PAST MEDICAL HISTORY: Past Medical History  Diagnosis Date  . Multiple sclerosis 2010  . HTN (hypertension)   . Polysubstance abuse   . Anxiety and depression   . Chronic pain     PAST SURGICAL HISTORY: Past Surgical History  Procedure Laterality Date  . Mediastinoscopy  05/15/2012    Procedure: MEDIASTINOSCOPY;  Surgeon: Melrose Nakayama, MD;  Location: [place];  Service: Thoracic;  Laterality: N/A;  . Cholecystectomy N/A 04/16/2013    Procedure: LAPAROSCOPIC CHOLECYSTECTOMY;  Surgeon: Jamesetta So, MD;  Location: [facility];  Service: General;  Laterality: N/A;    FAMILY HISTORY: Family History  Problem Relation Age of Onset  . Heart failure Mother   . Stroke Mother   . Heart failure Father   . Cancer Sister   . Seizures Brother     SOCIAL HISTORY:  History   Social History  . Marital Status: Single    Spouse Name: N/A    Number of Children: 0  . Years of Education: HS   Occupational History  . auto technician      disabled    Social History Main Topics  . Smoking status: Former Smoker -- 1.00 packs/day for 30 years    Types: Cigarettes    Quit date: 05/01/2012  . Smokeless tobacco: Never Used  . Alcohol Use: No     Comment: quit 05/01/2012  . Drug Use: Yes    Special: Cocaine, Marijuana     Comment: 05/01/2012 last use  . Sexual Activity: No   Other Topics Concern  . Not on file   Social History Narrative   Patient lives at home with spouse.   Caffeine Use: 3 cups daily     PHYSICAL EXAM  Filed Vitals:   11/05/13 1411  BP: 130/74  Pulse: 65  Temp: 98 F (36.7 C)  TempSrc: Oral  Height: '6\' 3"'  (1.905 m)  Weight: 270 lb (122.471 kg)    Not recorded    Body mass index is  33.75 kg/(m^2).  GENERAL EXAM: Patient is in no distress  CARDIOVASCULAR: Regular rate and rhythm, no murmurs, no carotid bruits  NEUROLOGIC: MENTAL STATUS: awake, alert, language fluent, comprehension intact, naming intact CRANIAL NERVE: pupils equal and reactive to light, visual fields full to confrontation, extraocular muscles SHOW  MILD DECR LEFT EYE ADDUCTION WITH NYSTAGMUS OF RIGHT EYE ON RIGHT LATERAL GAZE. LEFT GAZE, UP AND DOWN GAZE NORMAL. MILD SACCADIC DYSMETRIA. Facial sensation and strength symmetric, uvula midline, shoulder shrug symmetric, tongue midline. MOTOR: normal bulk IN BUE. SIGNIFICANT SPASTICITY IN BLE. FULL STRENGTH IN BUE.  BLE 2/5.  SENSORY: DECR IN BLE TO ALL MODALITIES COORDINATION: finger-nose-finger, fine finger movements normal REFLEXES: BUE 2, BLE 3  GAIT/STATION: IN WHEEL CHAIR. CANNOT STAND.   DIAGNOSTIC DATA (LABS, IMAGING, TESTING) - I reviewed patient records, labs, notes, testing and imaging myself where available.  Lab Results  Component Value Date   WBC 16.0* 04/17/2013   HGB 12.7* 04/17/2013   HCT 37.1* 04/17/2013   MCV 88.1 04/17/2013   PLT 236 04/17/2013      Component Value Date/Time   NA 137 04/17/2013 0509   K 4.7 04/17/2013 0509   CL 102 04/17/2013 0509   CO2 27 04/17/2013 0509   GLUCOSE 179* 04/17/2013 0509   BUN 13 04/17/2013 0509   CREATININE 0.93 04/17/2013 0509   CALCIUM 9.6 04/17/2013 0509   PROT 7.2 04/16/2013 1751   ALBUMIN 3.8 04/16/2013 1751   AST 62* 04/16/2013 1751   ALT 116* 04/16/2013 1751   ALKPHOS 83 04/16/2013 1751   BILITOT 0.6 04/16/2013 1751   GFRNONAA >90 04/17/2013 0509   GFRAA >90 04/17/2013 0509   No results found for this basename: CHOL,  HDL,  LDLCALC,  LDLDIRECT,  TRIG,  CHOLHDL   Lab Results  Component Value Date   HGBA1C 6.0* 04/15/2013   Lab Results  Component Value Date   VITAMINB12 264 05/08/2012   Lab Results  Component Value Date   TSH 0.579 05/08/2012    09/15/10 MRI BRAIN 1. Multiple  supratentorial chronic demyelinating plaques. 2. No acute plaques are seen. 3. In comparison to the prior MRI from 01/27/09, there is no significant interval change.  09/15/10 MRI CERVICAL 1. Chronic demyelianting plaques at C3 and T2. 2. No acute plaques. 3. At C3-4 there is moderate right foraminal stenosis. 4. At C6-7 there is severe biforaminal stenosis. 5. In comparison to the prior MRI from 01/27/09, the C3 and T2 lesions have decreased in size.  10/02/13 MRI brain (with and without):  1. Multiple periventricular and subcortical and juxtacortical chronic demyelinating plaques. Some of these are hypointense on T1 views.  2. No abnormal lesions are seen on post contrast views.  3. No change from MRI on 05/09/12.  10/02/13 MRI cervical spine (with and without):  1. At C3-4: disc bulging with moderate right foraminal stenosis. Right postero-lateral chronic demyelinating plaque. No acute plaques.  2. At C4-5: disc bulging with moderate left foraminal stenosis.  3. At C6-7: disc bulging and facet hypertrophy with severe biforaminal foraminal stenosis.  4. No significant change from MRI on 05/09/12.  09/15/13 JCV antibody - negative   ASSESSMENT AND PLAN  53 y.o. year old male here with multiple sclerosis, spastic paraplegia, getting worse on Copaxone, mainly with worsening spasticity. JCV antibody negative. Will switch copaxone to tysabri.   PLAN: - transition copaxone to tysabri - continue baclofen 14m TID-QID - consider baclofen pump or botox injections for spasticity   Return in about 3 months (around 02/02/2014).    VPenni Bombard MD 20/05/6760 39:50PM Certified in Neurology, Neurophysiology and Neuroimaging  [facility] [address] [place] [phone]

## 2013-11-05 NOTE — Patient Instructions (Signed)
Continue your last 2 injections of copaxone, then stop.  I will order tysabri infusions.

## 2013-11-20 ENCOUNTER — Telehealth: Payer: Self-pay | Admitting: *Deleted

## 2013-11-20 NOTE — Telephone Encounter (Signed)
I called and spoke to [facility] about Pt does not require PA for tysabri.   They will be contacting the pt re: is he needs financial assistance.

## 2013-11-28 ENCOUNTER — Telehealth: Payer: Self-pay | Admitting: Diagnostic Neuroimaging

## 2013-11-28 NOTE — Telephone Encounter (Signed)
Patient states Dr. Marjory Lies ordered for him to have Tysabri at [facility]. Patient received a letter from the Touch program with a number to call, he called the number it was the cancer center and they had no idea what he was talking about. Patient is confused about who needs to set up his Tysabri. Please call and advise.

## 2013-11-28 NOTE — Telephone Encounter (Signed)
I called pt and he stated that he is ready to proceed with appt for tysabri.  He had questions relating to his sx getting better , I told him that Dr. Marjory Lies would be able to give him information re: this.   He still has baclofen which does not seem to help his stiffness/spasms.  He will call [place] [facility] re: his fianancial assistance and make sure he is a go.  [facility] will not be able to price his financial responsibility until after he has done.  He can check with them in admitting after first dose.  We make first appt for his tysabri.  His last dose of copaxone was 11-07-13.

## 2013-11-30 ENCOUNTER — Telehealth: Payer: Self-pay | Admitting: Diagnostic Neuroimaging

## 2013-11-30 ENCOUNTER — Other Ambulatory Visit: Payer: Self-pay | Admitting: Diagnostic Neuroimaging

## 2013-11-30 DIAGNOSIS — I050 Rheumatic mitral stenosis: Secondary | ICD-10-CM

## 2013-11-30 NOTE — Telephone Encounter (Signed)
Patient calling to check on whether his Donnamarie Poagysabri has been scheduled [facility]. Please call and advise.

## 2013-11-30 NOTE — Telephone Encounter (Signed)
Ok to schedule tysabri.

## 2013-11-30 NOTE — Telephone Encounter (Signed)
Patient calling about his tysabri. Please advise.

## 2013-11-30 NOTE — Telephone Encounter (Signed)
I called and spoke to Chad Vasquez.  She will fax another copy of SOB on pt.  Also pt has outside [employer]. Is ready to proceed.

## 2013-12-13 ENCOUNTER — Telehealth: Payer: Self-pay | Admitting: Diagnostic Neuroimaging

## 2013-12-13 DIAGNOSIS — G35 Multiple sclerosis: Secondary | ICD-10-CM

## 2013-12-13 NOTE — Telephone Encounter (Signed)
Called Marjory Lies at [facility], patient's case manager and she wanted Dr. Marjory Lies to know that the patient has been having elevated blood sugar ranging 130-140, while on Prednisone. Patient stated that he have not been checking his blood sugar recently. Marylene Land, case manager would like an order for PT, with [facility]. Once it is done, please give case manager a call at [phone] ext. [phone]. Please advise

## 2013-12-13 NOTE — Telephone Encounter (Signed)
Marjory Lies, a nurse case manager, calling to speak with a nurse regarding patient's elevated blood sugar last week when he was taking Prednisone. Please call her back at the number listed, her extension is [phone] and she will be there until 2 PM today. She states it is okay to leave a voicemail message.

## 2013-12-14 NOTE — Telephone Encounter (Signed)
I called patient. He has been off prednisone x 3 months. Please have him follow up with PCP for sugars. On schedule for tysabri next Wednesday. -VRP

## 2013-12-14 NOTE — Telephone Encounter (Signed)
Patient was notified and states he has an upcoming appointment to see Dr Phillips Odor, who will check his blood sugar, and all of his labs--- FYI.  Patient was reminded about Tysabri appointment.  Patient was advised to call the office with any other questions, or concerns.   Patient verbalized understanding.

## 2013-12-19 ENCOUNTER — Encounter (HOSPITAL_COMMUNITY)
Admission: RE | Admit: 2013-12-19 | Discharge: 2013-12-19 | Disposition: A | Discharge status: Home / Self Care | Payer: Medicare Other | Source: Ambulatory Visit | Attending: Diagnostic Neuroimaging | Admitting: Diagnostic Neuroimaging

## 2013-12-19 ENCOUNTER — Encounter (HOSPITAL_COMMUNITY): Payer: Self-pay

## 2013-12-19 ENCOUNTER — Other Ambulatory Visit: Payer: Self-pay | Admitting: *Deleted

## 2013-12-19 VITALS — BP 134/76 | HR 56 | Temp 97.7°F | Resp 16 | Ht 75.0 in | Wt 268.0 lb

## 2013-12-19 DIAGNOSIS — I050 Rheumatic mitral stenosis: Secondary | ICD-10-CM

## 2013-12-19 DIAGNOSIS — G35 Multiple sclerosis: Secondary | ICD-10-CM | POA: Insufficient documentation

## 2013-12-19 MED ORDER — SODIUM CHLORIDE 0.9 % IV SOLN
300.0000 mg | INTRAVENOUS | Status: DC
  Administered 2013-12-19: 300 mg via INTRAVENOUS
  Filled 2013-12-19: qty 15

## 2013-12-19 MED ORDER — LORATADINE 10 MG PO TABS
10.0000 mg | ORAL_TABLET | ORAL | Status: DC
  Administered 2013-12-19: 10 mg via ORAL
  Filled 2013-12-19: qty 1

## 2013-12-19 MED ORDER — SODIUM CHLORIDE 0.9 % IV SOLN
INTRAVENOUS | Status: DC
  Administered 2013-12-19: 12:00:00 via INTRAVENOUS

## 2013-12-19 MED ORDER — ACETAMINOPHEN 500 MG PO TABS
1000.0000 mg | ORAL_TABLET | ORAL | Status: DC
  Administered 2013-12-19: 1000 mg via ORAL
  Filled 2013-12-19: qty 2

## 2013-12-19 NOTE — Discharge Instructions (Signed)
Tysabri °Natalizumab injection °What is this medicine? °NATALIZUMAB (na ta LIZ you mab) is used to treat relapsing multiple sclerosis. This drug is not a cure. It is also used to treat Crohn's disease. °This medicine may be used for other purposes; ask your health care provider or pharmacist if you have questions. °COMMON BRAND NAME(S): Tysabri °What should I tell my health care provider before I take this medicine? °They need to know if you have any of these conditions: °-immune system problems °-progressive multifocal leukoencephalopathy (PML) °-an unusual or allergic reaction to natalizumab, other medicines, foods, dyes, or preservatives °-pregnant or trying to get pregnant °-breast-feeding °How should I use this medicine? °This medicine is for infusion into a vein. It is given by a health care professional in a hospital or clinic setting. °A special MedGuide will be given to you by the pharmacist with each prescription and refill. Be sure to read this information carefully each time. °Talk to your pediatrician regarding the use of this medicine in children. This medicine is not approved for use in children. °Overdosage: If you think you have taken too much of this medicine contact a poison control center or emergency room at once. °NOTE: This medicine is only for you. Do not share this medicine with others. °What if I miss a dose? °It is important not to miss your dose. Call your doctor or health care professional if you are unable to keep an appointment. °What may interact with this medicine? °-azathioprine °-cyclosporine °-interferon °-6-mercaptopurine °-methotrexate °-steroid medicines like prednisone or cortisone °-TNF-alpha inhibitors like adalimumab, etanercept, and infliximab °-vaccines °This list may not describe all possible interactions. Give your health care provider a list of all the medicines, herbs, non-prescription drugs, or dietary supplements you use. Also tell them if you smoke, drink alcohol,  or use illegal drugs. Some items may interact with your medicine. °What should I watch for while using this medicine? °Your condition will be monitored carefully while you are receiving this medicine. Visit your doctor for regular check ups. Tell your doctor or healthcare professional if your symptoms do not start to get better or if they get worse. °Stay away from people who are sick. Call your doctor or health care professional for advice if you get a fever, chills or sore throat, or other symptoms of a cold or flu. Do not treat yourself. °In some patients, this medicine may cause a serious brain infection that may cause death. If you have any problems seeing, thinking, speaking, walking, or standing, tell your doctor right away. If you cannot reach your doctor, get urgent medical care. °What side effects may I notice from receiving this medicine? °Side effects that you should report to your doctor or health care professional as soon as possible: °-allergic reactions like skin rash, itching or hives, swelling of the face, lips, or tongue °-breathing problems °-changes in vision °-chest pain °-dark urine °-depression, feelings of sadness °-dizziness °-general ill feeling or flu-like symptoms °-irregular, missed, or painful menstrual periods °-light-colored stools °-loss of appetite, nausea °-muscle weakness °-problems with balance, talking, or walking °-right upper belly pain °-unusually weak or tired °-yellowing of the eyes or skin °Side effects that usually do not require medical attention (report to your doctor or health care professional if they continue or are bothersome): °-aches, pains °-headache °-stomach upset °-tiredness °This list may not describe all possible side effects. Call your doctor for medical advice about side effects. You may report side effects to FDA at 1-800-FDA-1088. °Where should I   keep my medicine? °This drug is given in a hospital or clinic and will not be stored at home. °NOTE: This  sheet is a summary. It may not cover all possible information. If you have questions about this medicine, talk to your doctor, pharmacist, or health care provider. °© 2014, Elsevier/Gold Standard. (2008-11-09 13:33:21) ° °

## 2013-12-19 NOTE — Progress Notes (Signed)
Patient tolerated first Tysabri treatment well.

## 2013-12-20 ENCOUNTER — Other Ambulatory Visit: Payer: Self-pay | Admitting: *Deleted

## 2013-12-20 DIAGNOSIS — G35 Multiple sclerosis: Secondary | ICD-10-CM

## 2014-01-17 ENCOUNTER — Encounter (HOSPITAL_COMMUNITY)
Admission: RE | Admit: 2014-01-17 | Discharge: 2014-01-17 | Disposition: A | Discharge status: Home / Self Care | Payer: Medicare Other | Source: Ambulatory Visit | Attending: Diagnostic Neuroimaging | Admitting: Diagnostic Neuroimaging

## 2014-01-17 VITALS — BP 125/76 | HR 56 | Temp 97.0°F | Resp 16

## 2014-01-17 DIAGNOSIS — G35 Multiple sclerosis: Secondary | ICD-10-CM | POA: Insufficient documentation

## 2014-01-17 DIAGNOSIS — I050 Rheumatic mitral stenosis: Secondary | ICD-10-CM

## 2014-01-17 MED ORDER — SODIUM CHLORIDE 0.9 % IV SOLN
INTRAVENOUS | Status: DC
  Administered 2014-01-17: 12:00:00 via INTRAVENOUS

## 2014-01-17 MED ORDER — ACETAMINOPHEN 500 MG PO TABS
1000.0000 mg | ORAL_TABLET | ORAL | Status: DC
  Administered 2014-01-17: 1000 mg via ORAL
  Filled 2014-01-17: qty 2

## 2014-01-17 MED ORDER — SODIUM CHLORIDE 0.9 % IV SOLN
300.0000 mg | INTRAVENOUS | Status: DC
  Administered 2014-01-17: 300 mg via INTRAVENOUS
  Filled 2014-01-17: qty 15

## 2014-01-17 MED ORDER — LORATADINE 10 MG PO TABS
10.0000 mg | ORAL_TABLET | ORAL | Status: DC
  Administered 2014-01-17: 10 mg via ORAL
  Filled 2014-01-17: qty 1

## 2014-01-18 ENCOUNTER — Telehealth: Payer: Self-pay | Admitting: *Deleted

## 2014-01-18 NOTE — Telephone Encounter (Signed)
Enrique Sack calling from [facility] PT that would like verbal order to continue PT for balance and R hip strengthening 3 x week for next 4 wks.

## 2014-01-21 NOTE — Telephone Encounter (Signed)
Go ahead with PT. -VRP

## 2014-01-21 NOTE — Telephone Encounter (Signed)
Called and LMVM for Damaris Schooner, PT for [facility] to let her know that ok to continue with PT.  She is to call back if needed.

## 2014-01-25 ENCOUNTER — Telehealth: Payer: Self-pay | Admitting: Diagnostic Neuroimaging

## 2014-01-25 DIAGNOSIS — G35 Multiple sclerosis: Secondary | ICD-10-CM

## 2014-01-25 NOTE — Telephone Encounter (Signed)
Home PT Enrique Sack called to report that patient is having new right upper quadrant abd pain and distention x 1 week. No other neuro symptoms except possibly more fatigue in evening.   I will check MRI brain (patient is on tysabri) and have patient follow up with PCP for abd pain workup.  Suanne Marker, MD 01/25/2014, 1:40 PM Certified in Neurology, Neurophysiology and Neuroimaging  [facility] [address] [place], [place] [address] [phone]

## 2014-01-29 ENCOUNTER — Telehealth: Payer: Self-pay | Admitting: Diagnostic Neuroimaging

## 2014-01-29 NOTE — Telephone Encounter (Signed)
Called patient to reschedule 02/07/14 appointment per Dr. Richrd HumblesPenumalli's schedule, patient was very upset about this and states he cannot wait until first available to be seen. Patient states he is having problems with the Tysabri and is having side effects, and patient has an MRI scheduled on 02/05/14. Please call and advise patient.

## 2014-01-29 NOTE — Telephone Encounter (Signed)
Called pt to resch an appt from 02/07/14 per Dr. Marjory Lies. I made an appt for the pt on 02/11/14. I advised the pt that if he has any other problems, questions or concerns to call the office. Pt verbalized understanding.

## 2014-01-29 NOTE — Telephone Encounter (Signed)
Pt called back states he apologizes for being mean to the lady he spoke with concerning changing his appointment. Pt is just hurting and having problems with Tysabri and having swollen in his stomach. Please call pt back concerning getting him in after 02/05/14. Thanks

## 2014-01-30 ENCOUNTER — Emergency Department (HOSPITAL_COMMUNITY)
Admission: EM | Admit: 2014-01-30 | Discharge: 2014-01-30 | Disposition: A | Discharge status: Home / Self Care | Payer: Medicare Other | Attending: Emergency Medicine | Admitting: Emergency Medicine

## 2014-01-30 ENCOUNTER — Ambulatory Visit (HOSPITAL_COMMUNITY)
Admission: RE | Admit: 2014-01-30 | Discharge: 2014-01-30 | Disposition: A | Discharge status: Home / Self Care | Payer: Medicare Other | Source: Ambulatory Visit | Attending: Family Medicine | Admitting: Family Medicine

## 2014-01-30 ENCOUNTER — Other Ambulatory Visit (HOSPITAL_COMMUNITY): Payer: Self-pay | Admitting: Family Medicine

## 2014-01-30 ENCOUNTER — Encounter (HOSPITAL_COMMUNITY): Payer: Self-pay | Admitting: Emergency Medicine

## 2014-01-30 DIAGNOSIS — R109 Unspecified abdominal pain: Secondary | ICD-10-CM

## 2014-01-30 DIAGNOSIS — K55059 Acute (reversible) ischemia of intestine, part and extent unspecified: Secondary | ICD-10-CM | POA: Insufficient documentation

## 2014-01-30 DIAGNOSIS — I10 Essential (primary) hypertension: Secondary | ICD-10-CM | POA: Insufficient documentation

## 2014-01-30 DIAGNOSIS — R933 Abnormal findings on diagnostic imaging of other parts of digestive tract: Secondary | ICD-10-CM | POA: Insufficient documentation

## 2014-01-30 DIAGNOSIS — K6389 Other specified diseases of intestine: Secondary | ICD-10-CM | POA: Insufficient documentation

## 2014-01-30 DIAGNOSIS — G8929 Other chronic pain: Secondary | ICD-10-CM | POA: Insufficient documentation

## 2014-01-30 DIAGNOSIS — I7090 Unspecified atherosclerosis: Secondary | ICD-10-CM

## 2014-01-30 DIAGNOSIS — F411 Generalized anxiety disorder: Secondary | ICD-10-CM | POA: Insufficient documentation

## 2014-01-30 DIAGNOSIS — Z9089 Acquired absence of other organs: Secondary | ICD-10-CM | POA: Insufficient documentation

## 2014-01-30 DIAGNOSIS — K55069 Acute infarction of intestine, part and extent unspecified: Secondary | ICD-10-CM

## 2014-01-30 DIAGNOSIS — Z79899 Other long term (current) drug therapy: Secondary | ICD-10-CM | POA: Insufficient documentation

## 2014-01-30 DIAGNOSIS — K449 Diaphragmatic hernia without obstruction or gangrene: Secondary | ICD-10-CM

## 2014-01-30 DIAGNOSIS — G35 Multiple sclerosis: Secondary | ICD-10-CM | POA: Insufficient documentation

## 2014-01-30 DIAGNOSIS — K529 Noninfective gastroenteritis and colitis, unspecified: Secondary | ICD-10-CM

## 2014-01-30 DIAGNOSIS — Z87891 Personal history of nicotine dependence: Secondary | ICD-10-CM | POA: Insufficient documentation

## 2014-01-30 MED ORDER — IOHEXOL 300 MG/ML  SOLN
100.0000 mL | Freq: Once | INTRAMUSCULAR | Status: AC | PRN
  Administered 2014-01-30: 100 mL via INTRAVENOUS

## 2014-01-30 NOTE — Discharge Instructions (Signed)
Take aleve 2 tabs OTC twice a day for pain. This is an area of fat called omentum around your liver that is inflamed and causing dead tissue. You should notice it improving over the next 1-2 weeks. Return to the ED if you get a fever, get worsening pain, have vomiting or any worsening symptoms.

## 2014-01-30 NOTE — ED Provider Notes (Signed)
CSN: [id number]     Arrival date & time 01/30/14  1731 History  This chart was scribed for Chad [name], MD by Charline BillsEssence Howell, ED Scribe. The patient was seen in room APA19/APA19. Patient's care was started at 9:23 PM.    Chief Complaint  Patient presents with  . Abdominal Pain    The history is provided by the patient. No language interpreter was used.   HPI Comments: Chad Vasquez is a 53 y.o. male who presents to the Emergency Department complaining of gradually worsening RUQ pain onset 01/22/14 that is now tender to touch. Pain is worse with bending and sneezing. Pt reports associated abdominal bloating after being on steroids for a MS flare. Pt denies fever, nausea, vomiting, diarrhea, cough, urinary symptoms, and change in appetite.  Patient was seen by his PCP today and sent for an outpatient CT scan after which he was told to come to the ED  Pt quit smoking.  Pt uses a wheelchair. He is able to move his legs but states that they are too weak to support himself. Patient has MS.  PCP Dr Phillips OdorGolding  Past Medical History  Diagnosis Date  . Multiple sclerosis 2010  . HTN (hypertension)   . Polysubstance abuse   . Anxiety and depression   . Chronic pain    Past Surgical History  Procedure Laterality Date  . Mediastinoscopy  05/15/2012    Procedure: MEDIASTINOSCOPY;  Surgeon: Loreli SlotSteven C Hendrickson, MD;  [facility];  Service: Thoracic;  Laterality: N/A;  . Cholecystectomy N/A 04/16/2013    Procedure: LAPAROSCOPIC CHOLECYSTECTOMY;  Surgeon: Dalia HeadingMark A Jenkins, MD;  Location: [facility] ORS;  Service: General;  Laterality: N/A;   Family History  Problem Relation Age of Onset  . Heart failure Mother   . Stroke Mother   . Heart failure Father   . Cancer Sister   . Seizures Brother    History  Substance Use Topics  . Smoking status: Former Smoker -- 1.00 packs/day for 30 years    Types: Cigarettes    Quit date: 05/01/2012  . Smokeless tobacco: Never Used  . Alcohol Use: No     Comment:  quit 05/01/2012  uses a wheelchair Lives at home Lives with spouse  Review of Systems  Constitutional: Negative for appetite change.  Respiratory: Negative for cough.   Gastrointestinal: Positive for abdominal pain. Negative for nausea, vomiting and diarrhea.  Genitourinary: Negative for dysuria and difficulty urinating.  All other systems reviewed and are negative.   Allergies  Review of patient's allergies indicates no known allergies.  Home Medications   Prior to Admission medications   Medication Sig Start Date End Date Taking? Authorizing Provider  albuterol (PROVENTIL HFA;VENTOLIN HFA) 108 (90 BASE) MCG/ACT inhaler Inhale 2 puffs into the lungs every 6 (six) hours as needed for wheezing. 01/31/13  Yes Leslye Peerobert S Byrum, MD  baclofen (LIORESAL) 20 MG tablet Take 20 mg by mouth 2 (two) times daily. 08/03/13  Yes Suanne MarkerVikram R Penumalli, MD  Cholecalciferol (VITAMIN D3) 2000 UNITS TABS Take 1 tablet by mouth every morning.    Yes Historical Provider, MD  diazepam (VALIUM) 5 MG tablet Take 10 mg by mouth at bedtime.    Yes Historical Provider, MD  docusate sodium (COLACE) 100 MG capsule Take 100 mg by mouth at bedtime.    Yes Historical Provider, MD  losartan (COZAAR) 100 MG tablet Take 1 tablet by mouth every morning.    Yes Historical Provider, MD  oxyCODONE-acetaminophen (PERCOCET) 7.5-325  MG per tablet Take 1 tablet by mouth every 4 (four) hours as needed for pain.   Yes Historical Provider, MD  lidocaine (LIDODERM) 5 % Place 1 patch onto the skin as needed. Remove & Discard patch within 12 hours or as directed by MD 04/30/13   Ronal Fear, NP  pantoprazole (PROTONIX) 40 MG tablet Take 40 mg by mouth daily. 05/19/12 08/13/13  Sorin Luanne Bras, MD  tizanidine (ZANAFLEX) 2 MG capsule Take 1 capsule (2 mg total) by mouth 3 (three) times daily as needed for muscle spasms. 09/14/13   Suanne Marker, MD   Triage Vitals: BP 138/73  Pulse 58  Temp(Src) 98.1 F (36.7 C) (Oral)  Resp 15  Ht 6\' 3"   (1.905 m)  Wt 263 lb (119.296 kg)  BMI 32.87 kg/m2  SpO2 97%  Vital signs normal   Physical Exam  Nursing note and vitals reviewed. Constitutional: He is oriented to person, place, and time. He appears well-developed and well-nourished.  Non-toxic appearance. He does not appear ill. No distress.  HENT:  Head: Normocephalic and atraumatic.  Right Ear: External ear normal.  Left Ear: External ear normal.  Nose: Nose normal. No mucosal edema or rhinorrhea.  Mouth/Throat: Oropharynx is clear and moist and mucous membranes are normal. No dental abscesses or uvula swelling.  Eyes: Conjunctivae and EOM are normal. Pupils are equal, round, and reactive to light.  Neck: Normal range of motion and full passive range of motion without pain. Neck supple.  Cardiovascular: Normal rate, regular rhythm and normal heart sounds.  Exam reveals no gallop and no friction rub.   No murmur heard. Pulmonary/Chest: Effort normal and breath sounds normal. No respiratory distress. He has no wheezes. He has no rhonchi. He has no rales. He exhibits no tenderness and no crepitus.  Abdominal: Soft. Normal appearance and bowel sounds are normal. He exhibits no distension. There is tenderness in the right upper quadrant. There is no rebound and no guarding.    Patient is tender in the right upper quadrant without guarding or rebound  Musculoskeletal: Normal range of motion. He exhibits no edema and no tenderness.  Moves all extremities well.   Neurological: He is alert and oriented to person, place, and time. He has normal strength. No cranial nerve deficit.  Skin: Skin is warm, dry and intact. No rash noted. No erythema. No pallor.  Psychiatric: He has a normal mood and affect. His speech is normal and behavior is normal. His mood appears not anxious.    ED Course  Procedures (including critical care time) DIAGNOSTIC STUDIES: Oxygen Saturation is 97% on RA, normal by my interpretation.    COORDINATION OF  CARE: 9:28 PM-patient given the results of his CT scan. He was advised this was a benign process and would not need any acute surgical intervention. It would be painful but should improve in the next one to 2 weeks. Patient states knowing that he did not need anything else for pain other than Aleve over-the-counter.    Labs Review Labs Reviewed - No data to display  Imaging Review Ct Abdomen Pelvis W Contrast  01/30/2014   CLINICAL DATA:  Right-sided abdominal pain for the past week.  EXAM: CT ABDOMEN AND PELVIS WITH CONTRAST  TECHNIQUE: Multidetector CT imaging of the abdomen and pelvis was performed using the standard protocol following bolus administration of intravenous contrast.  CONTRAST:  OMNIPAQUE IOHEXOL 300 MG/ML  SOLN  COMPARISON:  CT of the chest, abdomen and pelvis 05/12/2012.  FINDINGS: Lung Bases: Multiple small calcified granulomas in the lung bases bilaterally. Small hiatal hernia.  Abdomen/Pelvis: Status post cholecystectomy. The appearance of the liver, pancreas, spleen, bilateral adrenal glands and bilateral kidneys is unremarkable. No significant volume of ascites. No pneumoperitoneum. No pathologic distention of small bowel. No lymphadenopathy identified within the abdomen or pelvis. There are some inflammatory changes in the omental fat adjacent to the hepatic flexure of the colon, best appreciated on image 38 of series 2. The colon in this region is otherwise normal in appearance. Normal appendix. Prostate gland and urinary bladder are unremarkable in appearance. Atherosclerotic calcifications throughout the abdominal and pelvic vasculature, without evidence of aneurysm or dissection.  Musculoskeletal: There are no aggressive appearing lytic or blastic lesions noted in the visualized portions of the skeleton.  IMPRESSION: 1. Unusual inflammatory changes in the fat adjacent to the hepatic flexure of the colon. This could be seen in the setting of an omental infarction, or could  be related to epiploic appendagitis. 2. No other acute findings in the abdomen or pelvis. 3. Specifically, the appendix is normal. 4. Status post cholecystectomy. 5. Small hiatal hernia. 6. Atherosclerosis. These results will be called to the ordering clinician or representative by the Radiologist Assistant, and communication documented in the PACS Dashboard.   Electronically Signed   By: Trudie Reed M.D.   On: 01/30/2014 17:04     EKG Interpretation None      MDM   Final diagnoses:  Omental infarction  Epiploic appendagitis    Plan discharge  Devoria Albe, MD, FACEP    I personally performed the services described in this documentation, which was scribed in my presence. The recorded information has been reviewed and considered.  Devoria Albe, MD, Armando Gang    Chad Givens, MD 01/30/14 [phone]

## 2014-01-30 NOTE — ED Notes (Signed)
RUQ pain x 8 days. Denies n/v/d.  Had outpt CT and was sent here for further eval.

## 2014-01-31 NOTE — Progress Notes (Signed)
Pt's next scheduled appointment for Tysabri is 02/14/14 but he calls today to ask if  He "can take ibuprofen for severe abdominal pain" Pt proceeds to explain that he"started having joint pain and severe abdominal pain beginning 01/22/14 and called Dr Marjory Lies. He referred him to his PCP who then referred him to the ED where he got xrays". Pt states he was given discharge instructions to "take Aleve for this pain and contact his PCP again". Pt states he" has not taken the Aleve and tried to call his PCP but he is out of the office today" I asked pt if he was told to take ibuprofen and he said that the" ED MD told him to take anti-inflammatories and contact his PCP" I encouraged pt to take the anti-inflammatory as directed and evaluate his pain and contact his PCP in the AM". I also asked pt that if he is still having the abdominal pain when he is scheduled for his next Tysabri (on 02/14/14)hat he needs to contact Dr Marjory Lies to make sure it is OK for him to receive it Pt states he would do this and he also states he has an appointment with Dr Marjory Lies on 02/11/14 and will discuss all this with him.

## 2014-02-01 ENCOUNTER — Telehealth: Payer: Self-pay | Admitting: Diagnostic Neuroimaging

## 2014-02-01 DIAGNOSIS — G35 Multiple sclerosis: Secondary | ICD-10-CM

## 2014-02-01 NOTE — Telephone Encounter (Signed)
Enrique Sack, Physical Therapist with [facility] calling to get order for Orthotic HKAFO. Please advise

## 2014-02-01 NOTE — Telephone Encounter (Signed)
Enrique SackKendra Physical Therapist w/[facility] calling to get an order for an Orthotic HKAFO--[facility] [phone] Tel#[phone].

## 2014-02-05 ENCOUNTER — Ambulatory Visit
Admission: RE | Admit: 2014-02-05 | Discharge: 2014-02-05 | Disposition: A | Discharge status: Home / Self Care | Payer: Medicare Other | Source: Ambulatory Visit | Attending: Diagnostic Neuroimaging | Admitting: Diagnostic Neuroimaging

## 2014-02-05 DIAGNOSIS — G35 Multiple sclerosis: Secondary | ICD-10-CM

## 2014-02-05 MED ORDER — GADOBENATE DIMEGLUMINE 529 MG/ML IV SOLN
20.0000 mL | Freq: Once | INTRAVENOUS | Status: AC | PRN
  Administered 2014-02-05: 20 mL via INTRAVENOUS

## 2014-02-07 ENCOUNTER — Ambulatory Visit: Payer: Medicare Other | Admitting: Diagnostic Neuroimaging

## 2014-02-11 ENCOUNTER — Encounter: Payer: Self-pay | Admitting: Diagnostic Neuroimaging

## 2014-02-11 ENCOUNTER — Ambulatory Visit (INDEPENDENT_AMBULATORY_CARE_PROVIDER_SITE_OTHER): Payer: Medicare Other | Admitting: Diagnostic Neuroimaging

## 2014-02-11 VITALS — BP 123/74 | HR 54

## 2014-02-11 DIAGNOSIS — G35 Multiple sclerosis: Secondary | ICD-10-CM

## 2014-02-11 NOTE — Progress Notes (Signed)
[facility]  PATIENT: Chad Vasquez DOB: May 13, 1961  REFERRING CLINICIAN:  HISTORY FROM: patient REASON FOR VISIT: follow up   HISTORICAL  CHIEF COMPLAINT:  Chief Complaint  Patient presents with  . Follow-up    MS    HISTORY OF PRESENT ILLNESS:   UPDATE 02/11/14: Doing well on tysabri. Feels slight improvement in leg/feet. Had some RUQ pain, had CT abd and found to have: "unusual inflammatory changes in the fat adjacent to the hepatic flexure of the colon. This could be seen in the setting of an omental infarction, or could be related to epiploic appendicitis." Went to ER and told it was a benign findings and follow up with PCP.   UPDATE 11/05/13: Since last visit, tried tizanidine, which helped for a while, but then started wearing off. Now on combination of oxycodone and diazepam which helps. Reviewed labs and MRI results.   UPDATE 09/14/13: Since last visit patient tried baclofen 20 mg 4 times per day without relief of spasms. Patient tried tizanidine 2 mg tablet x1, and then he fell asleep for 4 hours. He did not try any further tizanidine since that time. Patient continues on diazepam 5 mg 3 times a day. Overall he feels his muscle spasms and spasticity legs are increasing over time. Patient continues on Copaxone for disease modifying therapy. Regarding his sarcoidosis he is on prednisone taper dose and doing well. His main problem nowadays is decreased mobility, balance difficulty, like spasms and leg pain.  UPDATE 08/03/13: Since last visit patient complains of increasing cramps, stiffness, pain and spasticity in the legs. This typically affects him when he lays down at nighttime and to go to sleep. Sometimes it wakes him up out of sleep. Patient tells me that his sarcoidosis might be flaring up based on recent imaging, and he has followup with rheumatology to determine prednisone dosing.  UPDATE 04/30/13 (LL): [name] for work-in  appointment, having increased bilateral leg weakness and pain in right leg since cholecystectomy on July 14. States he was getting around pretty well before that, able to walk with a walker. Now he feels like his legs "are stuck in the mud" and he cannot pick them up. He is having to pick the legs up with his arms to adjust them in the wheelchair. He is not getting any PT but does regular exercises at home that PT taught his previously. He would rather not take any additional prednisone if he can avoid it. He is currently on Prednisone 64m daily, and Dr. BAmil Amenhopes to wean it to off. He denies any problems with bowel or bladder. He has "cold" pain from the knee down on the right leg and c/o burning "bee-sting" pain in the right lateral thigh. He does not drive since [address] a lady friend helps him to appointments.  PRIOR HPI (11/28/12, [name]: 53[place] beginning in 2007 with difficulty putting weight on his left leg. He would get weak for a period of time then  he would be "okay" for 4-5 months.He could go a year between episodes. He continued to work.At the end of 2008 he noted difficulty walking that was persistent. 01/2009 he got up one morning and couldn't walk. [facility] CAT scan of the brain 01/26/2009 showed hypoattenuation in the subcortical periventricular white matter consistent with chronic microvascular disease or MS. MRI  study of the brain and cervical spine 01/27/2009 showed premature atrophy with numerous white matter lesions in the brain suggestive of chronic MS. There was a  lesion at C3-4 in the spinal cord in the right hemicord consistent with the diagnosis of multiple sclerosis. There was also an area at T2-3, suspicious of a lesion. No lesions enhanced. I reviewed these studies. LP was performed 4/27 with glucose 78, WBC 6, RBC 3, increased IgG  synthesis of 15.2, increased IgG index, and greater than 5 oligoclonal bands. Urine test was positive for cocaine. Blood studies were negative for B12, RPR,homocysteine, and TSH. He went on generic Betaseron injections every other day beginning 03/2009. He was switched to [facility] 12/2009.I saw him 02/09/10 and obtained ANA, ACE, HIV, lyme titer, and RA factor which were negative. He has never seen an ophthalmologist or optometrist. He felt badly when he began taking Betaseron with symptoms of, aching, and nausea.He stopped Betaseron 06/2011 and  felt better off of medication.MRI study of the brain and cervical spine 09/15/2010 showed multiple supratentorial white matter demyelinating plaques without change versus 01/27/09. Cervical spine showed chronic demyelinating plaques at C3 and T2 with the lesions decreased in size versus 01/27/09. He received IV Solu-Medrol  09/2010 He began Copaxone 08/2011. 12/2011 he noted numbness in his right leg progressing to involve his right hip, knee, and ankle  [name] had an MRI of the  lumbar spine showing multilevel degenerative disc disease but no significant canal stenosis and  no significant root compression. There was anteriorlisthesis of L5 on S1. Patient was seen by [name] [phone] and placed  the patient on nonsteroidal anti-inflammatory medications. 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 04/12/2012 his symptoms improved. He continued however to have weakness  in his right greater than left leg with Lhermitte's sign He began having spasms In his legs. No electrical shocks in his leg which improved with carbamazepine 200 mg 3 times daily. 05/07/2012 he developed fever, pain in his hips and legs,and quadriparesis.He was admitted to [facility] where CT scan of the chest, abdomen, and pelvis revealed mediastinal and hilar adenopathy and  hepatic steatosis. MRIs showed stable lesions  in the brain,cervical,  and thoracic spine and DJD in the lumbar spine. His ACE level was greater than 100 ESR 30, CRP 29, CK 60, HepA and B.,SPEP, ANA, and a, TSH, histo, blood cultures, CMV, Crypto, HIV, and HSV negative. Biopsy of a  mediastinal lymph node showed noncaseating granuloma connsistent with sarcoidosis. He was placed on high-dose prednisone and gradually tapered to his current dose of 5 mg daily. 8/17/213 he was unable to move his legs. He was discharged home 06/16/2012 with home PT. He can stand and use a walker or wheelchair. He requires assistance dressing himself but can bathe himself and  take care of his toileting needs. He has bowel and bladder continence. His walking ability has decreased as the prednisone has been decreased. his swelling in his feet and legs. His weight has gone up to 290 pounds DEXA scan 10/2012 was normal . Has increased tone in the legs.   REVIEW OF SYSTEMS: Full 14 system review of systems performed and notable only for     ALLERGIES: No Known Allergies  HOME MEDICATIONS: Outpatient Prescriptions Prior to Visit  Medication Sig Dispense Refill  . albuterol (PROVENTIL HFA;VENTOLIN HFA) 108 (90 BASE) MCG/ACT inhaler Inhale 2 puffs into the lungs  every 6 (six) hours as needed for wheezing.  1 Inhaler  6  . baclofen (LIORESAL) 20 MG tablet Take 20 mg by mouth 2 (two) times daily.      . calcium carbonate (TUMS - DOSED IN MG ELEMENTAL CALCIUM) 500 MG chewable tablet Chew 1 tablet by mouth daily as needed for indigestion or heartburn.      . Cholecalciferol (VITAMIN D3) 2000 UNITS TABS Take 1 tablet by mouth every morning.       . diazepam (VALIUM) 5 MG tablet Take 10 mg by mouth at bedtime.       . docusate sodium (COLACE) 100 MG capsule Take 200 mg by mouth at bedtime.       Marland Kitchen losartan (COZAAR) 100 MG tablet Take 1 tablet by mouth every morning.       . natalizumab (TYSABRI) 300 MG/15ML injection Inject 300 mg into the vein every 30 (thirty) days.      Marland Kitchen  oxyCODONE-acetaminophen (PERCOCET) 7.5-325 MG per tablet Take 1 tablet by mouth every 4 (four) hours as needed for pain.      Marland Kitchen psyllium (METAMUCIL) 58.6 % powder Take 1 packet by mouth every morning.      . pantoprazole (PROTONIX) 40 MG tablet Take 40 mg by mouth daily as needed (for acid reflux/indigestion).        No facility-administered medications prior to visit.    PAST MEDICAL HISTORY: Past Medical History  Diagnosis Date  . Multiple sclerosis 2010  . HTN (hypertension)   . Polysubstance abuse   . Anxiety and depression   . Chronic pain     PAST SURGICAL HISTORY: Past Surgical History  Procedure Laterality Date  . Mediastinoscopy  05/15/2012    Procedure: MEDIASTINOSCOPY;  Surgeon: Melrose Nakayama, MD;  Location: [facility];  Service: Thoracic;  Laterality: N/A;  . Cholecystectomy N/A 04/16/2013    Procedure: LAPAROSCOPIC CHOLECYSTECTOMY;  Surgeon: Jamesetta So, MD;  Location: [facility];  Service: General;  Laterality: N/A;    FAMILY HISTORY: Family History  Problem Relation Age of Onset  . Heart failure Mother   . Stroke Mother   . Heart failure Father   . Cancer Sister   . Seizures Brother     SOCIAL HISTORY:  History   Social History  . Marital Status: Single    Spouse Name: N/A    Number of Children: 0  . Years of Education: HS   Occupational History  . auto technician      disabled    Social History Main Topics  . Smoking status: Former Smoker -- 1.00 packs/day for 30 years    Types: Cigarettes    Quit date: 05/01/2012  . Smokeless tobacco: Never Used  . Alcohol Use: No     Comment: quit 05/01/2012  . Drug Use: Yes    Special: Cocaine, Marijuana     Comment: 05/01/2012 last use  . Sexual Activity: No   Other Topics Concern  . Not on file   Social History Narrative   Patient lives at home with spouse.   Caffeine Use: 3 cups daily     PHYSICAL EXAM  Filed Vitals:   02/11/14 1333  BP: 123/74  Pulse: 54    Not recorded    Cannot  calculate BMI with a height equal to zero.  GENERAL EXAM: Patient is in no distress  CARDIOVASCULAR: Regular rate and rhythm, no murmurs, no carotid bruits  NEUROLOGIC: MENTAL STATUS: awake, alert, language fluent, comprehension intact, naming  intact CRANIAL NERVE: pupils equal and reactive to light, visual fields full to confrontation, extraocular muscles: ON RIGHT GAZE --> DECR LEFT EYE ADDUCTION WITH NYSTAGMUS OF RIGHT EYE. ON LEFT GAZE, UP AND DOWN GAZE, NORMAL. MILD SACCADIC DYSMETRIA. Facial sensation and strength symmetric, uvula midline, shoulder shrug symmetric, tongue midline. MOTOR: normal bulk IN BUE. SIGNIFICANT SPASTICITY IN BLE. FULL STRENGTH IN BUE.  RLE (HF 2, KE 1-2, KF 1-2, DF 2-3). LLE (HF 2-3, KE/KF 2, DF 2-3).  SENSORY: DECR IN BLE TO ALL MODALITIES COORDINATION: finger-nose-finger, fine finger movements normal REFLEXES: BUE 2, BLE 3  GAIT/STATION: IN WHEEL CHAIR. CANNOT STAND.   DIAGNOSTIC DATA (LABS, IMAGING, TESTING) - I reviewed patient records, labs, notes, testing and imaging myself where available.  Lab Results  Component Value Date   WBC 16.0* 04/17/2013   HGB 12.7* 04/17/2013   HCT 37.1* 04/17/2013   MCV 88.1 04/17/2013   PLT 236 04/17/2013      Component Value Date/Time   NA 137 04/17/2013 0509   K 4.7 04/17/2013 0509   CL 102 04/17/2013 0509   CO2 27 04/17/2013 0509   GLUCOSE 179* 04/17/2013 0509   BUN 13 04/17/2013 0509   CREATININE 0.93 04/17/2013 0509   CALCIUM 9.6 04/17/2013 0509   PROT 7.2 04/16/2013 1751   ALBUMIN 3.8 04/16/2013 1751   AST 62* 04/16/2013 1751   ALT 116* 04/16/2013 1751   ALKPHOS 83 04/16/2013 1751   BILITOT 0.6 04/16/2013 1751   GFRNONAA >90 04/17/2013 0509   GFRAA >90 04/17/2013 0509   No results found for this basename: CHOL,  HDL,  LDLCALC,  LDLDIRECT,  TRIG,  CHOLHDL   Lab Results  Component Value Date   HGBA1C 6.0* 04/15/2013   Lab Results  Component Value Date   VITAMINB12 264 05/08/2012   Lab Results  Component Value  Date   TSH 0.579 05/08/2012    09/15/10 MRI BRAIN 1. Multiple supratentorial chronic demyelinating plaques. 2. No acute plaques are seen. 3. In comparison to the prior MRI from 01/27/09, there is no significant interval change.  09/15/10 MRI CERVICAL 1. Chronic demyelianting plaques at C3 and T2. 2. No acute plaques. 3. At C3-4 there is moderate right foraminal stenosis. 4. At C6-7 there is severe biforaminal stenosis. 5. In comparison to the prior MRI from 01/27/09, the C3 and T2 lesions have decreased in size.  10/02/13 MRI brain (with and without):  1. Multiple periventricular and subcortical and juxtacortical chronic demyelinating plaques. Some of these are hypointense on T1 views.  2. No abnormal lesions are seen on post contrast views.  3. No change from MRI on 05/09/12.  10/02/13 MRI cervical spine (with and without):  1. At C3-4: disc bulging with moderate right foraminal stenosis. Right postero-lateral chronic demyelinating plaque. No acute plaques.  2. At C4-5: disc bulging with moderate left foraminal stenosis.  3. At C6-7: disc bulging and facet hypertrophy with severe biforaminal foraminal stenosis.  4. No significant change from MRI on 05/09/12.  02/06/14 MRI brain (with and without) demonstrating:  1. Multiple periventricular, juxtacortical and subcortical chronic demyelinating plaques. Some of these are hypointense on T1 views.  2. No abnormal lesions are seen on post contrast views.  3. No change from MRI on 10/02/13.  09/15/13 JCV antibody - negative   ASSESSMENT AND PLAN  53 y.o. year old male here with multiple sclerosis, spastic paraplegia, getting worse on Copaxone, mainly with worsening spasticity. JCV antibody negative. Will switch copaxone to tysabri.   PLAN: -  continue tysabri - check JCV antibody today - MRI brain and JCV antibody every 6 months - agree with pain mgmt clinic evaluation   Return in about 3 months (around 05/14/2014).    Penni Bombard, MD 01/18/3844, 3:64 PM Certified in Neurology, Neurophysiology and Neuroimaging  [facility] [address] [place], [place] [address] [phone]

## 2014-02-11 NOTE — Patient Instructions (Signed)
Continue tysabri. 

## 2014-02-14 ENCOUNTER — Encounter (HOSPITAL_COMMUNITY)
Admission: RE | Admit: 2014-02-14 | Discharge: 2014-02-14 | Disposition: A | Discharge status: Home / Self Care | Payer: Medicare Other | Source: Ambulatory Visit | Attending: Diagnostic Neuroimaging | Admitting: Diagnostic Neuroimaging

## 2014-02-14 VITALS — BP 113/69 | HR 53 | Temp 97.9°F | Resp 18 | Ht 75.0 in | Wt 264.0 lb

## 2014-02-14 DIAGNOSIS — I050 Rheumatic mitral stenosis: Secondary | ICD-10-CM | POA: Insufficient documentation

## 2014-02-14 MED ORDER — NATALIZUMAB 300 MG/15ML IV CONC
300.0000 mg | INTRAVENOUS | Status: DC
  Administered 2014-02-14: 300 mg via INTRAVENOUS
  Filled 2014-02-14: qty 15

## 2014-02-14 MED ORDER — SODIUM CHLORIDE 0.9 % IV SOLN
INTRAVENOUS | Status: DC
  Administered 2014-02-14: 250 mL via INTRAVENOUS

## 2014-02-14 MED ORDER — LORATADINE 10 MG PO TABS
10.0000 mg | ORAL_TABLET | ORAL | Status: DC
  Administered 2014-02-14: 10 mg via ORAL
  Filled 2014-02-14: qty 1

## 2014-02-14 MED ORDER — ACETAMINOPHEN 500 MG PO TABS
1000.0000 mg | ORAL_TABLET | ORAL | Status: DC
  Administered 2014-02-14: 1000 mg via ORAL
  Filled 2014-02-14: qty 2

## 2014-02-14 NOTE — Progress Notes (Signed)
Uneventful infusion of Tysabri. Pt stayed for the 1 hour observation time and was discharged via w/c accompanied by staff to his wife at back door of short stay

## 2014-03-12 ENCOUNTER — Telehealth: Payer: Self-pay | Admitting: Diagnostic Neuroimaging

## 2014-03-12 NOTE — Telephone Encounter (Signed)
Patient wanting to come by on 03/14/14 to have Dr Marjory Lies sign a form from the [employer]. He has another appointment in [place] that day. Please call to advise

## 2014-03-13 NOTE — Telephone Encounter (Signed)
I called and LMVM for pt that form can be dropped off.   I do not know what it is about.  Be glad to show to Dr. Marjory Lies.   He can call back or just come by.

## 2014-03-14 ENCOUNTER — Encounter (HOSPITAL_COMMUNITY)
Admission: RE | Admit: 2014-03-14 | Discharge: 2014-03-14 | Disposition: A | Discharge status: Home / Self Care | Payer: Medicare Other | Source: Ambulatory Visit | Attending: Diagnostic Neuroimaging | Admitting: Diagnostic Neuroimaging

## 2014-03-14 ENCOUNTER — Telehealth: Payer: Self-pay | Admitting: Diagnostic Neuroimaging

## 2014-03-14 ENCOUNTER — Encounter: Payer: Self-pay | Admitting: *Deleted

## 2014-03-14 ENCOUNTER — Telehealth: Payer: Self-pay | Admitting: *Deleted

## 2014-03-14 DIAGNOSIS — I050 Rheumatic mitral stenosis: Secondary | ICD-10-CM | POA: Insufficient documentation

## 2014-03-14 DIAGNOSIS — G35 Multiple sclerosis: Secondary | ICD-10-CM

## 2014-03-14 NOTE — Progress Notes (Signed)
Pt complains of gradual decline in balance, vision ("like looking thru water"), memory (longer to process), and joints (greater pain in left shoulder and right hip) and overall well being since last Tysabri infusion.  Also reports ankle edema. Changes reported to Dr. Marjory Lies via RN Andrey Campanile, and Tysabri infusion cancelled for today. Instructed patient that Dr Richrd Humbles office would contact him to schedule recommended MRI brain with and without contrast. Released via wheelchair with significant other to drive Pt.

## 2014-03-14 NOTE — Telephone Encounter (Signed)
Kim with [facility] [phone] calling about pt there for tysabri.  Since last infusion 02-04-14, noted 2 wks from that date, started with blurry vision(looking under water), balance, cognitive -fuzzy, R hip joint pain, L should joint pain, feet swelling. (progressively getting worse).   Consulted Dr. Marjory Lies, cancel tysabri today.  Will get MRI brain w/wo.  Informed Kim, she would relay to pt.  Confirmed cell # for pt.  He will come over to office relating to MS society p/w to drop off.

## 2014-03-14 NOTE — Telephone Encounter (Signed)
I called patient. Will check MRI to evaluate new symptoms (vision changes, balance). -VRP

## 2014-03-14 NOTE — Telephone Encounter (Signed)
Pt here for dropping off forms for MS [employer].  Needs back asap.  Also had MRI brain in 03-08-14.   ? Need of brain or other.  Wanted call back from MD.  [phone].

## 2014-03-15 ENCOUNTER — Telehealth: Payer: Self-pay | Admitting: Diagnostic Neuroimaging

## 2014-03-15 DIAGNOSIS — M25569 Pain in unspecified knee: Secondary | ICD-10-CM

## 2014-03-15 DIAGNOSIS — M25559 Pain in unspecified hip: Secondary | ICD-10-CM

## 2014-03-15 DIAGNOSIS — M549 Dorsalgia, unspecified: Secondary | ICD-10-CM

## 2014-03-15 NOTE — Telephone Encounter (Signed)
Pt calling requesting to get an MRI of Spine, along with MRI of the brain, pt stated that he is having pain in hips and legs. Please advise

## 2014-03-15 NOTE — Telephone Encounter (Signed)
Will add MRI lumbar spine. -VRP

## 2014-03-15 NOTE — Telephone Encounter (Signed)
Called pt and left message informing him that Dr. Marjory Lies has put the order in for an MRI of the lumbar spine and if he has any other problems, questions or concerns to call the office.

## 2014-03-15 NOTE — Telephone Encounter (Signed)
Patient requesting MRI of the Spine along with MRI of Brain.  Patient stated having pain in hips and legs.

## 2014-04-08 ENCOUNTER — Other Ambulatory Visit: Payer: Self-pay | Admitting: Emergency Medicine

## 2014-04-09 ENCOUNTER — Ambulatory Visit
Admission: RE | Admit: 2014-04-09 | Discharge: 2014-04-09 | Disposition: A | Discharge status: Home / Self Care | Payer: Medicare Other | Source: Ambulatory Visit | Attending: Diagnostic Neuroimaging | Admitting: Diagnostic Neuroimaging

## 2014-04-09 DIAGNOSIS — M25559 Pain in unspecified hip: Secondary | ICD-10-CM

## 2014-04-09 DIAGNOSIS — G35 Multiple sclerosis: Secondary | ICD-10-CM

## 2014-04-09 DIAGNOSIS — M549 Dorsalgia, unspecified: Secondary | ICD-10-CM

## 2014-04-09 DIAGNOSIS — M25569 Pain in unspecified knee: Secondary | ICD-10-CM

## 2014-04-09 MED ORDER — GADOBENATE DIMEGLUMINE 529 MG/ML IV SOLN
20.0000 mL | Freq: Once | INTRAVENOUS | Status: AC | PRN
  Administered 2014-04-09: 20 mL via INTRAVENOUS

## 2014-04-10 ENCOUNTER — Telehealth: Payer: Self-pay | Admitting: *Deleted

## 2014-04-10 NOTE — Telephone Encounter (Signed)
Pt was here last and having some issues.  Order for MRI brain and lumbar spine done and to be read by Dr. Marjory LiesPenumalli today.   If ok to proceed call [facility] [place] for tomorrow infusion.  [phone]

## 2014-04-11 ENCOUNTER — Encounter (HOSPITAL_COMMUNITY): Admission: RE | Admit: 2014-04-11 | Payer: Medicare Other | Source: Ambulatory Visit

## 2014-04-11 ENCOUNTER — Telehealth: Payer: Self-pay | Admitting: Diagnostic Neuroimaging

## 2014-04-11 MED ORDER — MODAFINIL 200 MG PO TABS
200.0000 mg | ORAL_TABLET | Freq: Every day | ORAL | Status: DC
Start: 2014-04-11 — End: 2014-11-05

## 2014-04-11 NOTE — Telephone Encounter (Signed)
MRI brain looks stable. Ok to go forward with tysabri. -VRP

## 2014-04-11 NOTE — Telephone Encounter (Signed)
Patient calling to request MRI results. Patient also states he received call from [facility] stating that he didn't come to his Tysabri appointment--patient is confused if he was supposed to continue going to that since he was having side effects before. Please return call to patient and advise.

## 2014-04-11 NOTE — Telephone Encounter (Signed)
I called patient. I recommend patient continue tysabri. Will try him on provigil for fatigue.   Suanne Marker, MD 04/11/2014, 3:12 PM Certified in Neurology, Neurophysiology and Neuroimaging  [facility] [address], [address] [place], [place] [address] [phone]

## 2014-04-11 NOTE — Telephone Encounter (Signed)
Pt calling requesting MRI results. Please advise °

## 2014-04-15 ENCOUNTER — Telehealth: Payer: Self-pay | Admitting: Diagnostic Neuroimaging

## 2014-04-15 NOTE — Telephone Encounter (Signed)
Chad Vasquez with [employer] is calling to advise that Rx Modafinil 200mg  for patient was approved on 04-14-14 for one year.  The patient has been notified and a letter will be sent to us stating this.

## 2014-04-17 ENCOUNTER — Telehealth: Payer: Self-pay | Admitting: Diagnostic Neuroimaging

## 2014-04-17 ENCOUNTER — Telehealth: Payer: Self-pay

## 2014-04-17 NOTE — Telephone Encounter (Signed)
Patient calling to possibly try something else besides Tysabri infusion--please call--thank you.

## 2014-04-17 NOTE — Telephone Encounter (Signed)
[employer] [facility] [employer] sent [place] a letter saying they have approved our request for coverage on Modafinil effective until 04/15/2015 Ref Member # [id number]

## 2014-04-17 NOTE — Telephone Encounter (Signed)
I spoke with patient, and he agreed to continue tysabri. Let [facility] know that it is ok to restart tysabri. -VRP

## 2014-04-17 NOTE — Telephone Encounter (Signed)
LMVM for [facility] that pt will continue with tysabri.

## 2014-04-17 NOTE — Telephone Encounter (Signed)
Please advise previous note. Thanks  °

## 2014-04-23 ENCOUNTER — Telehealth: Payer: Self-pay | Admitting: Diagnostic Neuroimaging

## 2014-04-23 ENCOUNTER — Other Ambulatory Visit: Payer: Self-pay | Admitting: *Deleted

## 2014-04-23 DIAGNOSIS — G35 Multiple sclerosis: Secondary | ICD-10-CM

## 2014-04-23 NOTE — Telephone Encounter (Signed)
Per Dr.Sumner, ok for place orders for tysabri 05-09-14. Placed as an order entry.

## 2014-04-23 NOTE — Telephone Encounter (Signed)
Patient stated he's scheduled for Tysabri Treatment at [facility] on 05/09/14.  Needing an order faxed to [facility].

## 2014-04-23 NOTE — Telephone Encounter (Signed)
[place]'s [place], please advise.

## 2014-05-09 ENCOUNTER — Encounter (HOSPITAL_COMMUNITY): Admission: RE | Admit: 2014-05-09 | Payer: Medicare Other | Source: Ambulatory Visit

## 2014-05-09 NOTE — Telephone Encounter (Signed)
Noted  

## 2014-05-14 ENCOUNTER — Encounter: Payer: Self-pay | Admitting: Diagnostic Neuroimaging

## 2014-05-14 ENCOUNTER — Ambulatory Visit (INDEPENDENT_AMBULATORY_CARE_PROVIDER_SITE_OTHER): Payer: Medicare Other | Admitting: Diagnostic Neuroimaging

## 2014-05-14 VITALS — BP 133/70 | HR 53 | Ht 72.3 in | Wt 261.6 lb

## 2014-05-14 DIAGNOSIS — R269 Unspecified abnormalities of gait and mobility: Secondary | ICD-10-CM

## 2014-05-14 DIAGNOSIS — G35 Multiple sclerosis: Secondary | ICD-10-CM

## 2014-05-14 DIAGNOSIS — R259 Unspecified abnormal involuntary movements: Secondary | ICD-10-CM

## 2014-05-14 DIAGNOSIS — R252 Cramp and spasm: Secondary | ICD-10-CM

## 2014-05-14 NOTE — Patient Instructions (Signed)
Continue current medications. 

## 2014-05-14 NOTE — Progress Notes (Signed)
[facility]  PATIENT: Chad Vasquez DOB: August 06, 1961  REFERRING CLINICIAN:  HISTORY FROM: patient REASON FOR VISIT: follow up   HISTORICAL  CHIEF COMPLAINT:  Chief Complaint  Patient presents with  . Follow-up    MS    HISTORY OF PRESENT ILLNESS:   UPDATE 05/14/14: Since last visit, had some vision changes, right leg weakness, right leg spasms, which prompted MRI brain and lumbar spine and putting tysabri on hold; now imaging results reviewed and patient getting back on tysabri (05/21/14). Provigil helping with fatigue. Still struggling at home with ADLs. His SO has her own health issues.   UPDATE 02/11/14: Doing well on tysabri. Feels slight improvement in leg/feet. Had some RUQ pain, had CT abd and found to have: "unusual inflammatory changes in the fat adjacent to the hepatic flexure of the colon. This could be seen in the setting of an omental infarction, or could be related to epiploic appendicitis." Went to ER and told it was a benign findings and follow up with PCP.   UPDATE 11/05/13: Since last visit, tried tizanidine, which helped for a while, but then started wearing off. Now on combination of oxycodone and diazepam which helps. Reviewed labs and MRI results.   UPDATE 09/14/13: Since last visit patient tried baclofen 20 mg 4 times per day without relief of spasms. Patient tried tizanidine 2 mg tablet x1, and then he fell asleep for 4 hours. He did not try any further tizanidine since that time. Patient continues on diazepam 5 mg 3 times a day. Overall he feels his muscle spasms and spasticity legs are increasing over time. Patient continues on Copaxone for disease modifying therapy. Regarding his sarcoidosis he is on prednisone taper dose and doing well. His main problem nowadays is decreased mobility, balance difficulty, like spasms and leg pain.  UPDATE 08/03/13: Since last visit patient complains of increasing cramps, stiffness, pain and spasticity in the  legs. This typically affects him when he lays down at nighttime and to go to sleep. Sometimes it wakes him up out of sleep. Patient tells me that his sarcoidosis might be flaring up based on recent imaging, and he has followup with rheumatology to determine prednisone dosing.  UPDATE 04/30/13 (LL): [name] for work-in appointment, having increased bilateral leg weakness and pain in right leg since cholecystectomy on July 14. States he was getting around pretty well before that, able to walk with a walker. Now he feels like his legs "are stuck in the mud" and he cannot pick them up. He is having to pick the legs up with his arms to adjust them in the wheelchair. He is not getting any PT but does regular exercises at home that PT taught his previously. He would rather not take any additional prednisone if he can avoid it. He is currently on Prednisone 28m daily, and Dr. BAmil Amenhopes to wean it to off. He denies any problems with bowel or bladder. He has "cold" pain from the knee down on the right leg and c/o burning "bee-sting" pain in the right lateral thigh. He does not drive since [id number] a lady friend helps him to appointments.  PRIOR HPI (11/28/12, [name]: 53year [place] left[place] weakness beginning in 2007 with difficulty putting weight on his left leg. He would get weak for a period of time then  he would be "okay" for 4-5 months.He could go a year between episodes. He  continued to work. At the end of 2008 he noted difficulty walking that was persistent. 01/2009 he got up one morning and couldn't walk. He went to [facility] and was admitted. CAT scan of the brain 01/26/2009 showed hypoattenuation in the subcortical periventricular white matter consistent with chronic microvascular disease or MS. MRI study of the brain and cervical spine 01/27/2009 showed premature atrophy with numerous white matter  lesions in the brain suggestive of chronic MS. There was a  lesion at C3-4 in the spinal cord in the right hemicord consistent with the diagnosis of multiple sclerosis. There was also an area at T2-3, suspicious of a lesion. No lesions enhanced. I reviewed these studies. LP was performed 4/27 with glucose 78, WBC 6, RBC 3, increased IgG synthesis of 15.2, increased IgG index, and greater than 5 oligoclonal bands. Urine test was positive for cocaine. Blood studies were negative for B12, RPR,homocysteine, and TSH. He went on generic Betaseron injections every other day beginning 03/2009. He was switched to [facility] 12/2009.I saw him 02/09/10 and obtained ANA, ACE, HIV, lyme titer, and RA factor which were negative. He has never seen an ophthalmologist or optometrist. He felt badly when he began taking Betaseron with symptoms of, aching, and nausea.He stopped Betaseron 06/2011 and  felt better off of medication.MRI study of the brain and cervical spine 09/15/2010 showed multiple supratentorial white matter demyelinating plaques without change versus 01/27/09. Cervical spine showed chronic demyelinating plaques at C3 and T2 with the lesions decreased in size versus 01/27/09. He received IV Solu-Medrol  09/2010 He began Copaxone 08/2011. 12/2011 he noted numbness in his right leg progressing to involve his right hip, knee, and ankle  [name] had an MRI of the  lumbar spine showing multilevel degenerative disc disease but no significant canal stenosis and  no significant root compression. There was anteriorlisthesis of L5 on S1. Patient was seen by [name] [phone] and placed  the patient on nonsteroidal anti-inflammatory medications. 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 04/12/2012 his symptoms improved. He continued however to have weakness  in his right greater than left leg with Lhermitte's sign He began having spasms In his legs. No  electrical shocks in his leg which improved with carbamazepine 200 mg 3 times daily. 05/07/2012 he developed fever, pain in his hips and legs,and quadriparesis.He was admitted to [facility] where CT scan of the chest, abdomen, and pelvis revealed mediastinal and hilar adenopathy and  hepatic steatosis. MRIs showed stable lesions in the brain,cervical,  and thoracic spine and DJD in the lumbar spine. His ACE level was greater than 100 ESR 30, CRP 29, CK 60, HepA and B.,SPEP, ANA, and a, TSH, histo, blood cultures, CMV, Crypto, HIV, and HSV negative. Biopsy of a  mediastinal lymph node showed noncaseating granuloma connsistent with sarcoidosis. He was placed on high-dose prednisone and gradually tapered to his current dose of 5 mg daily. 8/17/213 he was unable to move his legs. He was discharged home 06/16/2012 with home PT. He can stand and use a walker or wheelchair. He requires assistance dressing himself but can bathe himself and  take care of his toileting needs. He has bowel and bladder continence. His walking ability has decreased as the prednisone has been decreased. his swelling in his feet and legs. His weight has gone up to 290 pounds DEXA scan 10/2012 was normal . Has increased tone  in the legs.   REVIEW OF SYSTEMS: Full 14 system review of systems performed and notable only for memory loss headache weakness tremors blurred vision fatigue swallow diff constipation tmep intolerance leg swelling.   ALLERGIES: No Known Allergies  HOME MEDICATIONS: Outpatient Prescriptions Prior to Visit  Medication Sig Dispense Refill  . baclofen (LIORESAL) 20 MG tablet Take 20 mg by mouth 3 (three) times daily.       . calcium carbonate (TUMS - DOSED IN MG ELEMENTAL CALCIUM) 500 MG chewable tablet Chew 1 tablet by mouth daily as needed for indigestion or heartburn.      . Cholecalciferol (VITAMIN D3) 2000 UNITS TABS Take 1 tablet by mouth every morning.       . diazepam (VALIUM) 5 MG tablet Take 10 mg by  mouth at bedtime.       . docusate sodium (COLACE) 100 MG capsule Take 200 mg by mouth at bedtime.       Marland Kitchen ibuprofen (ADVIL,MOTRIN) 400 MG tablet Take 800 mg by mouth every 6 (six) hours as needed.      Marland Kitchen losartan (COZAAR) 100 MG tablet Take 1 tablet by mouth every morning.       . modafinil (PROVIGIL) 200 MG tablet Take 1 tablet (200 mg total) by mouth daily.  30 tablet  5  . natalizumab (TYSABRI) 300 MG/15ML injection Inject 300 mg into the vein every 30 (thirty) days.      Marland Kitchen oxyCODONE-acetaminophen (PERCOCET) 7.5-325 MG per tablet Take 1 tablet by mouth every 4 (four) hours as needed for pain.      . polyethylene glycol powder (GLYCOLAX/MIRALAX) powder       . PROAIR HFA 108 (90 BASE) MCG/ACT inhaler INHALE TWO PUFFS INTO THE LUNGS EVERY SIX HOURS AS NEEDED FOR WHEEZING  8.5 g  3  . psyllium (METAMUCIL) 58.6 % powder Take 1 packet by mouth every morning.      . pantoprazole (PROTONIX) 40 MG tablet Take 40 mg by mouth daily as needed (for acid reflux/indigestion).        No facility-administered medications prior to visit.    PAST MEDICAL HISTORY: Past Medical History  Diagnosis Date  . Multiple sclerosis 2010  . HTN (hypertension)   . Polysubstance abuse   . Anxiety and depression   . Chronic pain     PAST SURGICAL HISTORY: Past Surgical History  Procedure Laterality Date  . Mediastinoscopy  05/15/2012    Procedure: MEDIASTINOSCOPY;  Surgeon: Melrose Nakayama, MD;  Location: [facility];  Service: Thoracic;  Laterality: N/A;  . Cholecystectomy N/A 04/16/2013    Procedure: LAPAROSCOPIC CHOLECYSTECTOMY;  Surgeon: Jamesetta So, MD;  Location: [facility];  Service: General;  Laterality: N/A;    FAMILY HISTORY: Family History  Problem Relation Age of Onset  . Heart failure Mother   . Stroke Mother   . Heart failure Father   . Cancer Sister   . Seizures Brother     SOCIAL HISTORY:  History   Social History  . Marital Status: Single    Spouse Name: N/A    Number of Children:  0  . Years of Education: HS   Occupational History  . auto technician      disabled    Social History Main Topics  . Smoking status: Former Smoker -- 1.00 packs/day for 30 years    Types: Cigarettes    Quit date: 05/01/2012  . Smokeless tobacco: Never Used  . Alcohol Use: No  Comment: quit 05/01/2012  . Drug Use: Yes    Special: Cocaine, Marijuana     Comment: 05/01/2012 last use  . Sexual Activity: No   Other Topics Concern  . Not on file   Social History Narrative   Patient lives at home with spouse.   Caffeine Use: 3 cups daily     PHYSICAL EXAM  Filed Vitals:   05/14/14 1435  BP: 133/70  Pulse: 53  Height: 6' 0.3" (1.836 m)  Weight: 261 lb 9.6 oz (118.661 kg)    Not recorded    Body mass index is 35.2 kg/(m^2).  GENERAL EXAM: Patient is in no distress  CARDIOVASCULAR: Regular rate and rhythm, no murmurs, no carotid bruits  NEUROLOGIC: MENTAL STATUS: awake, alert, language fluent, comprehension intact, naming intact CRANIAL NERVE: pupils equal and reactive to light, visual fields full to confrontation, extraocular muscles: ON RIGHT GAZE --> DECR LEFT EYE ADDUCTION WITH NYSTAGMUS OF RIGHT EYE. ON LEFT GAZE, UP AND DOWN GAZE, NORMAL. MILD SACCADIC DYSMETRIA. Facial sensation and strength symmetric, uvula midline, shoulder shrug symmetric, tongue midline. MOTOR: normal bulk IN BUE. MODERATE SPASTICITY IN BLE. FULL STRENGTH IN BUE.  RLE (HF 2, KE 1-2, KF 1-2, DF 2-3). LLE (HF 2-3, KE/KF 2, DF 2-3).  SENSORY: DECR IN BLE TO ALL MODALITIES COORDINATION: finger-nose-finger, fine finger movements normal REFLEXES: BUE 2, BLE 3  GAIT/STATION: IN WHEEL CHAIR. CANNOT STAND.   DIAGNOSTIC DATA (LABS, IMAGING, TESTING) - I reviewed patient records, labs, notes, testing and imaging myself where available.  Lab Results  Component Value Date   WBC 16.0* 04/17/2013   HGB 12.7* 04/17/2013   HCT 37.1* 04/17/2013   MCV 88.1 04/17/2013   PLT 236 04/17/2013      Component  Value Date/Time   NA 137 04/17/2013 0509   K 4.7 04/17/2013 0509   CL 102 04/17/2013 0509   CO2 27 04/17/2013 0509   GLUCOSE 179* 04/17/2013 0509   BUN 13 04/17/2013 0509   CREATININE 0.93 04/17/2013 0509   CALCIUM 9.6 04/17/2013 0509   PROT 7.2 04/16/2013 1751   ALBUMIN 3.8 04/16/2013 1751   AST 62* 04/16/2013 1751   ALT 116* 04/16/2013 1751   ALKPHOS 83 04/16/2013 1751   BILITOT 0.6 04/16/2013 1751   GFRNONAA >90 04/17/2013 0509   GFRAA >90 04/17/2013 0509   No results found for this basename: CHOL,  HDL,  LDLCALC,  LDLDIRECT,  TRIG,  CHOLHDL   Lab Results  Component Value Date   HGBA1C 6.0* 04/15/2013   Lab Results  Component Value Date   VITAMINB12 264 05/08/2012   Lab Results  Component Value Date   TSH 0.579 05/08/2012    10/02/13 MRI cervical spine (with and without):  1. At C3-4: disc bulging with moderate right foraminal stenosis. Right postero-lateral chronic demyelinating plaque. No acute plaques.  2. At C4-5: disc bulging with moderate left foraminal stenosis.  3. At C6-7: disc bulging and facet hypertrophy with severe biforaminal foraminal stenosis.  4. No significant change from MRI on 05/09/12.  04/09/14 MRI brain (with and without) demonstrating:  1. Multiple periventricular, juxtacortical and subcortical chronic demyelinating plaques. Some of these are hypointense on T1 views.  2. No abnormal lesions are seen on post contrast views.  3. No change from MRIs on 02/05/14 and 10/02/13.  04/09/14 MRI lumbar spine (without) demonstrating: 1. At L5-S1: pseudo-disc bulging and facet hypertrophy with moderate biforaminal stenosis; potential impingement upon the exiting bilateral L5 roots.  2. At L3-4: disc bulging  and facet hypertrophy with mild spinal stenosis mild biforaminal stenosis.  3. At L2-3, L4-5: disc bulging and facet hypertrophy with mild biforaminal stenosis.  4. No significant change from MRI on 05/08/12.  09/15/13 JCV antibody - negative 02/12/14 JCV antibody -  negative   ASSESSMENT AND PLAN  53 y.o. year old male here with multiple sclerosis, spastic paraplegia, getting worse on Copaxone, now on tysabri since June 2015.   PLAN: - continue tysabri - MRI brain and JCV antibody every 6 months - agree with pain mgmt clinic evaluation - may need to consider independent / assisted living facility if he continues to decline  Return in about 6 months (around 11/14/2014).    Penni Bombard, MD 9/69/4098, 2:86 PM Certified in Neurology, Neurophysiology and Neuroimaging  [facility] [address], [place], [place] [address] [phone]

## 2014-05-21 ENCOUNTER — Encounter (HOSPITAL_COMMUNITY): Payer: Self-pay

## 2014-05-21 ENCOUNTER — Encounter (HOSPITAL_COMMUNITY)
Admission: RE | Admit: 2014-05-21 | Discharge: 2014-05-21 | Disposition: A | Discharge status: Home / Self Care | Payer: Medicare Other | Source: Ambulatory Visit | Attending: Diagnostic Neuroimaging | Admitting: Diagnostic Neuroimaging

## 2014-05-21 VITALS — BP 125/74 | HR 52 | Temp 98.4°F | Resp 16

## 2014-05-21 DIAGNOSIS — G35 Multiple sclerosis: Secondary | ICD-10-CM | POA: Diagnosis not present

## 2014-05-21 MED ORDER — SODIUM CHLORIDE 0.9 % IV SOLN
Freq: Once | INTRAVENOUS | Status: AC
  Administered 2014-05-21: 09:00:00 via INTRAVENOUS

## 2014-05-21 MED ORDER — ACETAMINOPHEN 500 MG PO TABS
1000.0000 mg | ORAL_TABLET | Freq: Once | ORAL | Status: AC
  Administered 2014-05-21: 1000 mg via ORAL
  Filled 2014-05-21: qty 2

## 2014-05-21 MED ORDER — LORATADINE 10 MG PO TABS
10.0000 mg | ORAL_TABLET | Freq: Once | ORAL | Status: AC
  Administered 2014-05-21: 10 mg via ORAL
  Filled 2014-05-21: qty 1

## 2014-05-21 MED ORDER — NATALIZUMAB 300 MG/15ML IV CONC
300.0000 mg | INTRAVENOUS | Status: DC
  Administered 2014-05-21: 300 mg via INTRAVENOUS
  Filled 2014-05-21: qty 15

## 2014-05-21 NOTE — Progress Notes (Signed)
Pt received his Tysabri today without incidence.  Pt stayed his 1 hour observation as directed post infusion.  Pt d/c home in stable condition, Vss, afebrile.

## 2014-06-11 ENCOUNTER — Telehealth: Payer: Self-pay | Admitting: *Deleted

## 2014-06-11 NOTE — Telephone Encounter (Signed)
Pt calling has had since the last 2 wks, after getting his tysabri infusion on 05-21-14.  (headache 0ne sided, bilateral extreme joint pain, R hip and leg pain which goes dwon to foot.  Fatigue.  Level 8 today.  Taking oxycodone 7.5/325mg  one tab po bid, from tid from 2 months ago.  Was on tysabri March, April, May and off June/July , just restarted 05-21-14.  ? tysabri causing these sx?  Ok for flu vaccine?    Hard time getting PM appt.  05-30-14 tick bite, and a lot of mosquito bites.  Causing sx?

## 2014-06-13 NOTE — Telephone Encounter (Signed)
Setup appt if patient wants in AM to examine and review new symptoms. Also, ok to have inactive flu vaccine. -VRP

## 2014-06-14 NOTE — Telephone Encounter (Signed)
LMVM for pt on cell that he is to call me back about appt to have Dr. Marjory Lies to examine and and review new sx.  Pt called right back.  Made appt for 06-18-14 at 1030, be here 1015.    (offered Monday, he cound not make).

## 2014-06-18 ENCOUNTER — Ambulatory Visit (INDEPENDENT_AMBULATORY_CARE_PROVIDER_SITE_OTHER): Payer: Medicare Other | Admitting: Diagnostic Neuroimaging

## 2014-06-18 ENCOUNTER — Encounter (INDEPENDENT_AMBULATORY_CARE_PROVIDER_SITE_OTHER): Payer: Self-pay

## 2014-06-18 ENCOUNTER — Encounter: Payer: Self-pay | Admitting: Diagnostic Neuroimaging

## 2014-06-18 VITALS — BP 122/69 | HR 54 | Temp 97.7°F

## 2014-06-18 DIAGNOSIS — G35 Multiple sclerosis: Secondary | ICD-10-CM

## 2014-06-18 NOTE — Progress Notes (Signed)
[facility]  PATIENT: Chad Vasquez DOB: Feb 11, 1961  REFERRING CLINICIAN:  HISTORY FROM: patient and SO REASON FOR VISIT: follow up   HISTORICAL  CHIEF COMPLAINT:  Chief Complaint  Patient presents with  . Follow-up    MS    HISTORY OF PRESENT ILLNESS:   UPDATE 06/18/14: Since last visit, and back on tysabri. 2 weeks after infusion, had increasing joint pain (shoulders, elbows, right knee), but he has had these problems even before tysabri. Pain mgmt referral pending. Also, asking if he needs to see rheumatology for joint pain.  UPDATE 05/14/14: Since last visit, had some vision changes, right leg weakness, right leg spasms, which prompted MRI brain and lumbar spine and putting tysabri on hold; now imaging results reviewed and patient getting back on tysabri (05/21/14). Provigil helping with fatigue. Still struggling at home with ADLs. His SO has her own health issues.   UPDATE 02/11/14: Doing well on tysabri. Feels slight improvement in leg/feet. Had some RUQ pain, had CT abd and found to have: "unusual inflammatory changes in the fat adjacent to the hepatic flexure of the colon. This could be seen in the setting of an omental infarction, or could be related to epiploic appendicitis." Went to ER and told it was a benign findings and follow up with PCP.   UPDATE 11/05/13: Since last visit, tried tizanidine, which helped for a while, but then started wearing off. Now on combination of oxycodone and diazepam which helps. Reviewed labs and MRI results.   UPDATE 09/14/13: Since last visit patient tried baclofen 20 mg 4 times per day without relief of spasms. Patient tried tizanidine 2 mg tablet x1, and then he fell asleep for 4 hours. He did not try any further tizanidine since that time. Patient continues on diazepam 5 mg 3 times a day. Overall he feels his muscle spasms and spasticity legs are increasing over time. Patient continues on Copaxone for disease modifying  therapy. Regarding his sarcoidosis he is on prednisone taper dose and doing well. His main problem nowadays is decreased mobility, balance difficulty, like spasms and leg pain.  UPDATE 08/03/13: Since last visit patient complains of increasing cramps, stiffness, pain and spasticity in the legs. This typically affects him when he lays down at nighttime and to go to sleep. Sometimes it wakes him up out of sleep. Patient tells me that his sarcoidosis might be flaring up based on recent imaging, and he has followup with rheumatology to determine prednisone dosing.  UPDATE 04/30/13 (LL): [name] for work-in appointment, having increased bilateral leg weakness and pain in right leg since cholecystectomy on July 14. States he was getting around pretty well before that, able to walk with a walker. Now he feels like his legs "are stuck in the mud" and he cannot pick them up. He is having to pick the legs up with his arms to adjust them in the wheelchair. He is not getting any PT but does regular exercises at home that PT taught his previously. He would rather not take any additional prednisone if he can avoid it. He is currently on Prednisone 62m daily, and Dr. BAmil Amenhopes to wean it to off. He denies any problems with bowel or bladder. He has "cold" pain from the knee down on the right leg and c/o burning "bee-sting" pain in the right lateral thigh. He does not drive since [id number] a lady friend helps him to appointments.  PRIOR HPI (11/28/12, [name]: 53year old left-handed  white single male from [place], [place] who developed left leg weakness beginning in 2007 with difficulty putting weight on his left leg. He would get weak for a period of time then  he would be "okay" for 4-5 months.He could go a year between episodes. He continued to work. At the end of 2008 he noted difficulty walking that was persistent. 01/2009 he got up one morning and couldn't walk. He went to [facility]  Emergency Room and was admitted. CAT scan of the brain 01/26/2009 showed hypoattenuation in the subcortical periventricular white matter consistent with chronic microvascular disease or MS. MRI study of the brain and cervical spine 01/27/2009 showed premature atrophy with numerous white matter lesions in the brain suggestive of chronic MS. There was a  lesion at C3-4 in the spinal cord in the right hemicord consistent with the diagnosis of multiple sclerosis. There was also an area at T2-3, suspicious of a lesion. No lesions enhanced. I reviewed these studies. LP was performed 4/27 with glucose 78, WBC 6, RBC 3, increased IgG synthesis of 15.2, increased IgG index, and greater than 5 oligoclonal bands. Urine test was positive for cocaine. Blood studies were negative for B12, RPR,homocysteine, and TSH. He went on generic Betaseron injections every other day beginning 03/2009. He was switched to [facility] 12/2009.I saw him 02/09/10 and obtained ANA, ACE, HIV, lyme titer, and RA factor which were negative. He has never seen an ophthalmologist or optometrist. He felt badly when he began taking Betaseron with symptoms of, aching, and nausea.He stopped Betaseron 06/2011 and  felt better off of medication.MRI study of the brain and cervical spine 09/15/2010 showed multiple supratentorial white matter demyelinating plaques without change versus 01/27/09. Cervical spine showed chronic demyelinating plaques at C3 and T2 with the lesions decreased in size versus 01/27/09. He received IV Solu-Medrol  09/2010 He began Copaxone 08/2011. 12/2011 he noted numbness in his right leg progressing to involve his right hip, knee, and ankle  [name] had an MRI of the  lumbar spine showing multilevel degenerative disc disease but no significant canal stenosis and  no significant root compression. There was anteriorlisthesis of L5 on S1. Patient was seen by [name] [phone] and placed  the patient on nonsteroidal anti-inflammatory  medications. 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 04/12/2012 his symptoms improved. He continued however to have weakness  in his right greater than left leg with Lhermitte's sign He began having spasms In his legs. No electrical shocks in his leg which improved with carbamazepine 200 mg 3 times daily. 05/07/2012 he developed fever, pain in his hips and legs,and quadriparesis.He was admitted to [facility] where CT scan of the chest, abdomen, and pelvis revealed mediastinal and hilar adenopathy and  hepatic steatosis. MRIs showed stable lesions in the brain,cervical,  and thoracic spine and DJD in the lumbar spine. His ACE level was greater than 100 ESR 30, CRP 29, CK 60, HepA and B.,SPEP, ANA, and a, TSH, histo, blood cultures, CMV, Crypto, HIV, and HSV negative. Biopsy of a  mediastinal lymph node showed noncaseating granuloma connsistent with sarcoidosis. He was placed on high-dose prednisone and gradually tapered to his current dose of 5 mg daily. 8/17/213 he was unable to move his legs. He was discharged home 06/16/2012 with home PT. He can stand and use a walker or wheelchair. He requires assistance dressing himself but can bathe himself and  take care of his toileting needs. He has bowel and bladder continence. His walking ability has decreased as the prednisone has been decreased. his swelling in his feet and legs. His weight has gone up to 290 pounds DEXA scan 10/2012 was normal . Has increased tone in the legs.   REVIEW OF SYSTEMS: Full 14 system review of systems performed and notable only for memory loss headache weakness tremors blurred vision fatigue swallow diff constipation temp intolerance leg swelling decr activity snoring pain cramps gait diff.   ALLERGIES: No Known Allergies  HOME MEDICATIONS: Outpatient Prescriptions Prior to Visit  Medication Sig Dispense Refill  . baclofen (LIORESAL) 20 MG tablet  Take 20 mg by mouth 3 (three) times daily.       . calcium carbonate (TUMS - DOSED IN MG ELEMENTAL CALCIUM) 500 MG chewable tablet Chew 1 tablet by mouth daily as needed for indigestion or heartburn.      . Cholecalciferol (VITAMIN D3) 2000 UNITS TABS Take 1 tablet by mouth every morning.       . diazepam (VALIUM) 5 MG tablet Take 10 mg by mouth at bedtime.       . docusate sodium (COLACE) 100 MG capsule Take 200 mg by mouth at bedtime.       Marland Kitchen losartan (COZAAR) 100 MG tablet Take 1 tablet by mouth every morning.       . modafinil (PROVIGIL) 200 MG tablet Take 1 tablet (200 mg total) by mouth daily.  30 tablet  5  . natalizumab (TYSABRI) 300 MG/15ML injection Inject 300 mg into the vein every 30 (thirty) days.      Marland Kitchen oxyCODONE-acetaminophen (PERCOCET) 7.5-325 MG per tablet Take 1 tablet by mouth every 4 (four) hours as needed for pain.      . polyethylene glycol powder (GLYCOLAX/MIRALAX) powder       . PROAIR HFA 108 (90 BASE) MCG/ACT inhaler INHALE TWO PUFFS INTO THE LUNGS EVERY SIX HOURS AS NEEDED FOR WHEEZING  8.5 g  3  . pantoprazole (PROTONIX) 40 MG tablet Take 40 mg by mouth daily as needed (for acid reflux/indigestion).       Marland Kitchen ibuprofen (ADVIL,MOTRIN) 400 MG tablet Take 800 mg by mouth every 6 (six) hours as needed.      . psyllium (METAMUCIL) 58.6 % powder Take 1 packet by mouth every morning.       No facility-administered medications prior to visit.    PAST MEDICAL HISTORY: Past Medical History  Diagnosis Date  . Multiple sclerosis 2010  . HTN (hypertension)   . Polysubstance abuse   . Anxiety and depression   . Chronic pain     PAST SURGICAL HISTORY: Past Surgical History  Procedure Laterality Date  . Mediastinoscopy  05/15/2012    Procedure: MEDIASTINOSCOPY;  Surgeon: Melrose Nakayama, MD;  Location: [place];  Service: Thoracic;  Laterality: N/A;  . Cholecystectomy N/A 04/16/2013    Procedure: LAPAROSCOPIC CHOLECYSTECTOMY;  Surgeon: Jamesetta So, MD;  Location: [facility];  Service: General;  Laterality: N/A;    FAMILY HISTORY: Family History  Problem Relation Age of Onset  . Heart failure Mother   . Stroke Mother   . Heart failure Father   . Cancer Sister   . Seizures Brother     SOCIAL HISTORY:  History   Social History  . Marital Status: Single    Spouse Name: N/A    Number of Children: 0  . Years of Education: HS   Occupational History  .  auto technician      disabled    Social History Main Topics  . Smoking status: Former Smoker -- 1.00 packs/day for 30 years    Types: Cigarettes    Quit date: 05/01/2012  . Smokeless tobacco: Never Used  . Alcohol Use: No     Comment: quit 05/01/2012  . Drug Use: Yes    Special: Cocaine, Marijuana     Comment: 05/01/2012 last use  . Sexual Activity: No   Other Topics Concern  . Not on file   Social History Narrative   Patient lives at home with spouse.   Caffeine Use: 3 cups daily     PHYSICAL EXAM  Filed Vitals:   06/18/14 1027  BP: 122/69  Pulse: 54  Temp: 97.7 F (36.5 C)  TempSrc: Oral    Not recorded    Cannot calculate BMI with a height equal to zero.  GENERAL EXAM: Patient is in no distress  CARDIOVASCULAR: Regular rate and rhythm, no murmurs, no carotid bruits  NEUROLOGIC: MENTAL STATUS: awake, alert, language fluent, comprehension intact, naming intact CRANIAL NERVE: pupils equal and reactive to light, visual fields full to confrontation, extraocular muscles: ON RIGHT GAZE --> DECR LEFT EYE ADDUCTION WITH NYSTAGMUS OF RIGHT EYE. ON LEFT GAZE, UP AND DOWN GAZE, NORMAL. MILD SACCADIC DYSMETRIA. Facial sensation and strength symmetric, uvula midline, shoulder shrug symmetric, tongue midline. MOTOR: normal bulk IN BUE. MODERATE SPASTICITY IN BLE. FULL STRENGTH IN BUE.  RLE (HF 1-2, KE 1-2, KF 1-2, DF 1-2). LLE (HF 2-3, KE/KF 2, DF 3-4).  SENSORY: DECR IN BLE TO ALL MODALITIES COORDINATION: finger-nose-finger, fine finger movements normal REFLEXES: BUE 2, BLE 3    GAIT/STATION: IN WHEEL CHAIR. CANNOT STAND.   DIAGNOSTIC DATA (LABS, IMAGING, TESTING) - I reviewed patient records, labs, notes, testing and imaging myself where available.  Lab Results  Component Value Date   WBC 16.0* 04/17/2013   HGB 12.7* 04/17/2013   HCT 37.1* 04/17/2013   MCV 88.1 04/17/2013   PLT 236 04/17/2013      Component Value Date/Time   NA 137 04/17/2013 0509   K 4.7 04/17/2013 0509   CL 102 04/17/2013 0509   CO2 27 04/17/2013 0509   GLUCOSE 179* 04/17/2013 0509   BUN 13 04/17/2013 0509   CREATININE 0.93 04/17/2013 0509   CALCIUM 9.6 04/17/2013 0509   PROT 7.2 04/16/2013 1751   ALBUMIN 3.8 04/16/2013 1751   AST 62* 04/16/2013 1751   ALT 116* 04/16/2013 1751   ALKPHOS 83 04/16/2013 1751   BILITOT 0.6 04/16/2013 1751   GFRNONAA >90 04/17/2013 0509   GFRAA >90 04/17/2013 0509   No results found for this basename: CHOL,  HDL,  LDLCALC,  LDLDIRECT,  TRIG,  CHOLHDL   Lab Results  Component Value Date   HGBA1C 6.0* 04/15/2013   Lab Results  Component Value Date   VITAMINB12 264 05/08/2012   Lab Results  Component Value Date   TSH 0.579 05/08/2012    10/02/13 MRI cervical spine (with and without):  1. At C3-4: disc bulging with moderate right foraminal stenosis. Right postero-lateral chronic demyelinating plaque. No acute plaques.  2. At C4-5: disc bulging with moderate left foraminal stenosis.  3. At C6-7: disc bulging and facet hypertrophy with severe biforaminal foraminal stenosis.  4. No significant change from MRI on 05/09/12.  04/09/14 MRI brain (with and without) demonstrating:  1. Multiple periventricular, juxtacortical and subcortical chronic demyelinating plaques. Some of these are hypointense on T1 views.  2.  No abnormal lesions are seen on post contrast views.  3. No change from MRIs on 02/05/14 and 10/02/13.  04/09/14 MRI lumbar spine (without) demonstrating: 1. At L5-S1: pseudo-disc bulging and facet hypertrophy with moderate biforaminal stenosis; potential  impingement upon the exiting bilateral L5 roots.  2. At L3-4: disc bulging and facet hypertrophy with mild spinal stenosis mild biforaminal stenosis.  3. At L2-3, L4-5: disc bulging and facet hypertrophy with mild biforaminal stenosis.  4. No significant change from MRI on 05/08/12.  09/15/13 JCV antibody - negative 02/12/14 JCV antibody - negative   ASSESSMENT AND PLAN  53 y.o. year old male here with multiple sclerosis, spastic paraplegia, getting worse on Copaxone, now on tysabri since June 2015. More joint pains lately, but I don't think these are tysabri associated. Likely pain secondary to multiple sclerosis and osteoarthritis.   PLAN: - continue tysabri - MRI brain and JCV antibody every 6 months - agree with pain mgmt clinic evaluation  Return in about 4 months (around 10/18/2014).    Penni Bombard, MD 5/97/4163, 84:53 AM Certified in Neurology, Neurophysiology and Neuroimaging  [facility] [address], [place], [place] [address] [phone]

## 2014-06-26 ENCOUNTER — Encounter (HOSPITAL_COMMUNITY)
Admission: RE | Admit: 2014-06-26 | Discharge: 2014-06-26 | Disposition: A | Discharge status: Home / Self Care | Payer: Medicare Other | Source: Ambulatory Visit | Attending: Diagnostic Neuroimaging | Admitting: Diagnostic Neuroimaging

## 2014-06-26 ENCOUNTER — Encounter (HOSPITAL_COMMUNITY): Payer: Self-pay

## 2014-06-26 VITALS — BP 115/62 | HR 53 | Temp 97.8°F | Resp 16

## 2014-06-26 DIAGNOSIS — G35 Multiple sclerosis: Secondary | ICD-10-CM | POA: Diagnosis not present

## 2014-06-26 MED ORDER — LORATADINE 10 MG PO TABS
10.0000 mg | ORAL_TABLET | Freq: Once | ORAL | Status: AC
  Administered 2014-06-26: 10 mg via ORAL
  Filled 2014-06-26: qty 1

## 2014-06-26 MED ORDER — SODIUM CHLORIDE 0.9 % IV SOLN
300.0000 mg | INTRAVENOUS | Status: DC
  Administered 2014-06-26: 300 mg via INTRAVENOUS
  Filled 2014-06-26: qty 15

## 2014-06-26 MED ORDER — ACETAMINOPHEN 500 MG PO TABS
1000.0000 mg | ORAL_TABLET | Freq: Once | ORAL | Status: AC
  Administered 2014-06-26: 1000 mg via ORAL
  Filled 2014-06-26: qty 2

## 2014-06-26 MED ORDER — SODIUM CHLORIDE 0.9 % IV SOLN
Freq: Once | INTRAVENOUS | Status: AC
  Administered 2014-06-26: 12:00:00 via INTRAVENOUS

## 2014-06-26 NOTE — Progress Notes (Signed)
tysabri completed at 1340, pt stayed for his 1 hour post observation, no complications.  Pt d/c home with family member via wheelchair.

## 2014-06-26 NOTE — Discharge Instructions (Signed)

## 2014-07-22 ENCOUNTER — Telehealth: Payer: Self-pay | Admitting: Diagnostic Neuroimaging

## 2014-07-22 NOTE — Telephone Encounter (Signed)
Dee with [facility] @ [phone] requesting Tysabri order.

## 2014-07-23 ENCOUNTER — Other Ambulatory Visit: Payer: Self-pay | Admitting: Diagnostic Neuroimaging

## 2014-07-23 DIAGNOSIS — G35 Multiple sclerosis: Secondary | ICD-10-CM

## 2014-07-23 NOTE — Telephone Encounter (Signed)
Done. -VRP 

## 2014-07-25 ENCOUNTER — Encounter (HOSPITAL_COMMUNITY)
Admission: RE | Admit: 2014-07-25 | Discharge: 2014-07-25 | Disposition: A | Discharge status: Home / Self Care | Payer: Medicare Other | Source: Ambulatory Visit | Attending: Diagnostic Neuroimaging | Admitting: Diagnostic Neuroimaging

## 2014-07-25 DIAGNOSIS — G35 Multiple sclerosis: Secondary | ICD-10-CM | POA: Insufficient documentation

## 2014-08-09 ENCOUNTER — Other Ambulatory Visit: Payer: Self-pay | Admitting: Diagnostic Neuroimaging

## 2014-08-09 ENCOUNTER — Telehealth: Payer: Self-pay | Admitting: Diagnostic Neuroimaging

## 2014-08-09 MED ORDER — BACLOFEN 20 MG PO TABS: 20.0000 mg | ORAL_TABLET | Freq: Three times a day (TID) | ORAL | Status: DC

## 2014-08-09 NOTE — Telephone Encounter (Signed)
I called pt; no answer. Apparently patient called answering service for baclofen refill. I sent in refills. -VRP

## 2014-08-19 ENCOUNTER — Ambulatory Visit: Payer: Medicare Other | Admitting: Diagnostic Neuroimaging

## 2014-08-27 ENCOUNTER — Other Ambulatory Visit: Payer: Self-pay | Admitting: Neurology

## 2014-08-27 ENCOUNTER — Encounter (HOSPITAL_COMMUNITY): Payer: Self-pay

## 2014-08-27 ENCOUNTER — Encounter (HOSPITAL_COMMUNITY)
Admission: RE | Admit: 2014-08-27 | Payer: Medicare Other | Source: Ambulatory Visit | Attending: Diagnostic Neuroimaging | Admitting: Diagnostic Neuroimaging

## 2014-08-27 ENCOUNTER — Telehealth: Payer: Self-pay | Admitting: Diagnostic Neuroimaging

## 2014-08-27 ENCOUNTER — Ambulatory Visit (HOSPITAL_COMMUNITY)
Admission: RE | Admit: 2014-08-27 | Discharge: 2014-08-27 | Disposition: A | Discharge status: Home / Self Care | Payer: Medicare Other | Source: Ambulatory Visit | Attending: Neurology | Admitting: Neurology

## 2014-08-27 ENCOUNTER — Encounter (HOSPITAL_COMMUNITY)
Admission: RE | Admit: 2014-08-27 | Discharge: 2014-08-27 | Disposition: A | Discharge status: Home / Self Care | Payer: Medicare Other | Source: Ambulatory Visit | Attending: Neurology | Admitting: Neurology

## 2014-08-27 DIAGNOSIS — M25551 Pain in right hip: Secondary | ICD-10-CM | POA: Diagnosis present

## 2014-08-27 DIAGNOSIS — G35 Multiple sclerosis: Secondary | ICD-10-CM | POA: Insufficient documentation

## 2014-08-27 DIAGNOSIS — M25561 Pain in right knee: Secondary | ICD-10-CM | POA: Insufficient documentation

## 2014-08-27 DIAGNOSIS — M5400 Panniculitis affecting regions of neck and back, site unspecified: Secondary | ICD-10-CM

## 2014-08-27 MED ORDER — METHYLPREDNISOLONE SODIUM SUCC 125 MG IJ SOLR: 1000.0000 mg | Freq: Every day | INTRAMUSCULAR | Status: DC

## 2014-08-27 MED ORDER — SODIUM CHLORIDE 0.9 % IV SOLN
Freq: Once | INTRAVENOUS | Status: AC
  Administered 2014-08-27: 250 mL via INTRAVENOUS

## 2014-08-27 MED ORDER — SODIUM CHLORIDE 0.9 % IV SOLN
1000.0000 mg | INTRAVENOUS | Status: DC
  Administered 2014-08-27: 1000 mg via INTRAVENOUS
  Filled 2014-08-27: qty 8

## 2014-08-27 NOTE — Telephone Encounter (Signed)
Lindsey from [facility] calling to state that patient was a no show for his Tysabri today.

## 2014-08-27 NOTE — Telephone Encounter (Signed)
pls call patient to  See what happened. -VRP

## 2014-08-28 ENCOUNTER — Encounter (HOSPITAL_COMMUNITY)
Admission: RE | Admit: 2014-08-28 | Discharge: 2014-08-28 | Disposition: A | Discharge status: Home / Self Care | Payer: Medicare Other | Source: Ambulatory Visit | Attending: Neurology | Admitting: Neurology

## 2014-08-28 ENCOUNTER — Encounter (HOSPITAL_COMMUNITY): Payer: Self-pay

## 2014-08-28 DIAGNOSIS — G35 Multiple sclerosis: Secondary | ICD-10-CM | POA: Diagnosis not present

## 2014-08-28 MED ORDER — SODIUM CHLORIDE 0.9 % IV SOLN
Freq: Once | INTRAVENOUS | Status: AC
  Administered 2014-08-28: 250 mL via INTRAVENOUS

## 2014-08-28 MED ORDER — SODIUM CHLORIDE 0.9 % IV SOLN
1000.0000 mg | Freq: Once | INTRAVENOUS | Status: AC
  Administered 2014-08-28: 1000 mg via INTRAVENOUS
  Filled 2014-08-28: qty 8

## 2014-08-29 ENCOUNTER — Encounter (HOSPITAL_COMMUNITY)
Admission: RE | Admit: 2014-08-29 | Discharge: 2014-08-29 | Disposition: A | Discharge status: Home / Self Care | Payer: Medicare Other | Source: Ambulatory Visit | Attending: Neurology | Admitting: Neurology

## 2014-08-29 DIAGNOSIS — G35 Multiple sclerosis: Secondary | ICD-10-CM | POA: Diagnosis not present

## 2014-08-29 MED ORDER — SODIUM CHLORIDE 0.9 % IV SOLN
1000.0000 mg | Freq: Once | INTRAVENOUS | Status: DC
  Filled 2014-08-29: qty 8

## 2014-08-30 ENCOUNTER — Encounter (HOSPITAL_COMMUNITY)
Admission: RE | Admit: 2014-08-30 | Discharge: 2014-08-30 | Disposition: A | Discharge status: Home / Self Care | Payer: Medicare Other | Source: Ambulatory Visit | Attending: Neurology | Admitting: Neurology

## 2014-08-30 DIAGNOSIS — G35 Multiple sclerosis: Secondary | ICD-10-CM | POA: Diagnosis not present

## 2014-08-30 MED ORDER — SODIUM CHLORIDE 0.9 % IV SOLN
1000.0000 mg | Freq: Once | INTRAVENOUS | Status: DC
  Filled 2014-08-30: qty 8

## 2014-08-31 ENCOUNTER — Encounter (HOSPITAL_COMMUNITY)
Admission: RE | Admit: 2014-08-31 | Discharge: 2014-08-31 | Disposition: A | Discharge status: Home / Self Care | Payer: Medicare Other | Source: Ambulatory Visit | Attending: Neurology | Admitting: Neurology

## 2014-08-31 DIAGNOSIS — G35 Multiple sclerosis: Secondary | ICD-10-CM | POA: Diagnosis not present

## 2014-08-31 MED ORDER — SODIUM CHLORIDE 0.9 % IV SOLN
1000.0000 mg | Freq: Once | INTRAVENOUS | Status: DC
  Filled 2014-08-31: qty 8

## 2014-09-02 NOTE — Telephone Encounter (Signed)
Spoke to patient. He stated he just wasn't able to make it to his Tysabri treatment. He says he planned to call [facility] today and reschedule. He was waiting on the holiday to pass. He said he is feeling about the same as he was when he last visited in our office.

## 2014-09-06 MED FILL — Methylprednisolone Sod Succ For Inj 1000 MG (Base Equiv): INTRAMUSCULAR | Qty: 8 | Status: AC

## 2014-09-17 ENCOUNTER — Telehealth: Payer: Self-pay | Admitting: Diagnostic Neuroimaging

## 2014-09-17 NOTE — Telephone Encounter (Signed)
Regina with [employer] is calling regarding Tysabri for patient. Is patient continuing Tysabri? Patient has not been infused since September. Please call and advise.

## 2014-09-24 ENCOUNTER — Encounter (HOSPITAL_COMMUNITY): Payer: Medicare Other

## 2014-09-30 NOTE — Telephone Encounter (Signed)
[facility] and left a message, unable to reach patient. Have tried calling patient several times. Will mail letter.

## 2014-10-21 ENCOUNTER — Ambulatory Visit: Payer: Medicare Other | Admitting: Diagnostic Neuroimaging

## 2014-10-30 ENCOUNTER — Encounter (HOSPITAL_COMMUNITY): Payer: Self-pay

## 2014-10-30 ENCOUNTER — Encounter (HOSPITAL_COMMUNITY)
Admission: RE | Admit: 2014-10-30 | Discharge: 2014-10-30 | Disposition: A | Discharge status: Home / Self Care | Payer: Medicare Other | Source: Ambulatory Visit | Attending: Neurology | Admitting: Neurology

## 2014-10-30 DIAGNOSIS — G35 Multiple sclerosis: Secondary | ICD-10-CM | POA: Insufficient documentation

## 2014-10-30 MED ORDER — SODIUM CHLORIDE 0.9 % IV SOLN
INTRAVENOUS | Status: DC
  Administered 2014-10-30: 13:00:00 via INTRAVENOUS

## 2014-10-30 MED ORDER — SODIUM CHLORIDE 0.9 % IV SOLN
1000.0000 mg | Freq: Once | INTRAVENOUS | Status: AC
  Administered 2014-10-30: 1000 mg via INTRAVENOUS
  Filled 2014-10-30: qty 8

## 2014-10-31 ENCOUNTER — Encounter (HOSPITAL_COMMUNITY): Payer: Self-pay

## 2014-10-31 ENCOUNTER — Encounter (HOSPITAL_COMMUNITY)
Admission: RE | Admit: 2014-10-31 | Discharge: 2014-10-31 | Disposition: A | Discharge status: Home / Self Care | Payer: Medicare Other | Source: Ambulatory Visit | Attending: Neurology | Admitting: Neurology

## 2014-10-31 DIAGNOSIS — G35 Multiple sclerosis: Secondary | ICD-10-CM | POA: Diagnosis not present

## 2014-10-31 MED ORDER — SODIUM CHLORIDE 0.9 % IV SOLN
Freq: Once | INTRAVENOUS | Status: AC
  Administered 2014-10-31: 14:00:00 via INTRAVENOUS

## 2014-10-31 MED ORDER — SODIUM CHLORIDE 0.9 % IV SOLN
1000.0000 mg | Freq: Once | INTRAVENOUS | Status: AC
  Administered 2014-10-31: 1000 mg via INTRAVENOUS
  Filled 2014-10-31: qty 8

## 2014-11-01 ENCOUNTER — Encounter (HOSPITAL_COMMUNITY): Payer: Self-pay

## 2014-11-01 ENCOUNTER — Encounter (HOSPITAL_COMMUNITY)
Admission: RE | Admit: 2014-11-01 | Discharge: 2014-11-01 | Disposition: A | Discharge status: Home / Self Care | Payer: Medicare Other | Source: Ambulatory Visit | Attending: Neurology | Admitting: Neurology

## 2014-11-01 DIAGNOSIS — G35 Multiple sclerosis: Secondary | ICD-10-CM | POA: Diagnosis not present

## 2014-11-01 MED ORDER — SODIUM CHLORIDE 0.9 % IV SOLN
1000.0000 mg | Freq: Once | INTRAVENOUS | Status: AC
  Administered 2014-11-01: 1000 mg via INTRAVENOUS
  Filled 2014-11-01: qty 8

## 2014-11-01 MED ORDER — SODIUM CHLORIDE 0.9 % IV SOLN
INTRAVENOUS | Status: DC
  Administered 2014-11-01: 13:00:00 via INTRAVENOUS

## 2014-11-05 ENCOUNTER — Other Ambulatory Visit: Payer: Self-pay | Admitting: Diagnostic Neuroimaging

## 2014-11-06 ENCOUNTER — Other Ambulatory Visit: Payer: Self-pay | Admitting: Neurology

## 2014-11-06 ENCOUNTER — Ambulatory Visit (HOSPITAL_COMMUNITY)
Admission: RE | Admit: 2014-11-06 | Discharge: 2014-11-06 | Disposition: A | Discharge status: Home / Self Care | Payer: Medicare Other | Source: Ambulatory Visit | Attending: Neurology | Admitting: Neurology

## 2014-11-06 ENCOUNTER — Telehealth: Payer: Self-pay | Admitting: Diagnostic Neuroimaging

## 2014-11-06 DIAGNOSIS — M545 Low back pain: Secondary | ICD-10-CM

## 2014-11-06 NOTE — Telephone Encounter (Signed)
Rene Kocher with [employer] @ [phone], * 2, ext [phone], informing office that patient hasn't had Tysabri Infusion since 09/17/14.  Spoke with patient and he stated Tysabri wasn't for him and he was looking into alternatives.  Rene Kocher questioning if she could discontinue treatment request.  Please call and advise.

## 2014-11-07 ENCOUNTER — Telehealth: Payer: Self-pay | Admitting: *Deleted

## 2014-11-07 NOTE — Telephone Encounter (Signed)
Left message on home phone. Called cell phone and was able to speak to the pt. Asked him if he would like to make a follow-up appt to talk about different treatment options for MS. Pt states that he is taking avagio currently. States that it is working well for him at the current moment and that he does not want to make an appt with Dr. Marjory Lies at this time. He feels disappointed with his treatment and care. States that if he feels like he needs to he will call back to the office.

## 2015-02-12 ENCOUNTER — Ambulatory Visit (HOSPITAL_COMMUNITY): Payer: Medicare Other | Attending: Neurology | Admitting: Physical Therapy

## 2015-02-12 DIAGNOSIS — R269 Unspecified abnormalities of gait and mobility: Secondary | ICD-10-CM | POA: Diagnosis not present

## 2015-02-12 DIAGNOSIS — R29898 Other symptoms and signs involving the musculoskeletal system: Secondary | ICD-10-CM | POA: Insufficient documentation

## 2015-02-12 DIAGNOSIS — R262 Difficulty in walking, not elsewhere classified: Secondary | ICD-10-CM | POA: Insufficient documentation

## 2015-02-12 NOTE — Patient Instructions (Addendum)
Clam Shell 45 Degrees   Lying with hips and knees bent 45, one pillow between knees and ankles. Lift knee. Be sure pelvis does not roll backward. Do not arch back. Do 10 times, each leg, 3 times per day.  http://ss.exer.us/74   Copyright  VHI. All rights reserved.   Strengthening: Straight Leg Raise (Phase 1)   Tighten muscles on front of right thigh, then lift leg 6 or more inches from surface, keeping knee locked.  Repeat 5 times per set. Do 2-3sets per session. Do 2 sessions per day.  http://orth.exer.us/614   Knee Roll   Lying on back, with knees bent and feet flat on floor, arms outstretched to sides, slowly roll both knees to side, hold 5 seconds. Back to starting position, hold 5 seconds. Then to opposite side, hold 5 seconds. Return to starting position. Keep shoulders and arms in contact with floor. Perform 5x each   Copyright  VHI. All rights reserved.   FUNCTIONAL MOBILITY: Marching (Therapy Ball)   Sit on therapy ball. Raise left leg then right to march in place. Alternate. 10 reps per set, 2 sets per day, 7 days per week  Copyright  VHI. All rights reserved.  Mini Squat: Double Leg   With feet shoulder width apart, reach forward for balance and do a mini squat. Keep knees in line with second toe. Knees do not go past toes. Repeat 5 times per set. Rest 60 seconds after set. Do 3 sets per session. Once every other day  http://plyo.exer.us/70   Copyright  VHI. All rights reserved.     Achilles Tendon Stretch   Stand with hands supported on wall, elbows slightly bent, feet parallel and both heels on floor, front knee bent, back knee straight. Slowly relax back knee until a stretch is felt in achilles tendon. Hold 20 seconds. Repeat with leg positions switched. 4x times, 2x a day.

## 2015-02-12 NOTE — Therapy (Signed)
Chad Vasquez [address] [place], [place], [address] Phone: [phone]   Fax:  [phone]  Physical Therapy Evaluation  Patient Details  Name: Chad Vasquez MRN: [id number] Date of Birth: 1961-09-07 Referring Provider:  Beryle Beams, MD  Encounter Date: 02/12/2015      PT End of Session - 02/12/15 1410    Visit Number 1   Number of Visits 16   Date for PT Re-Evaluation 03/14/15   Authorization Type Medicare   Authorization Time Period 02/12/15-04/23/15   Authorization - Visit Number 1   Authorization - Number of Visits 10   PT Start Time 1305   PT Stop Time 1411   PT Time Calculation (min) 66 min   Activity Tolerance Patient tolerated treatment well   Behavior During Therapy [facility] for tasks assessed/performed      Past Medical History  Diagnosis Date  . Multiple sclerosis 2010  . HTN (hypertension)   . Polysubstance abuse   . Anxiety and depression   . Chronic pain     Past Surgical History  Procedure Laterality Date  . Mediastinoscopy  05/15/2012    Procedure: MEDIASTINOSCOPY;  Surgeon: Loreli Slot, MD;  Location: [facility] OR;  Service: Thoracic;  Laterality: N/A;  . Cholecystectomy N/A 04/16/2013    Procedure: LAPAROSCOPIC CHOLECYSTECTOMY;  Surgeon: Chad Heading, MD;  Location: [facility];  Service: General;  Laterality: N/A;    There were no vitals filed for this visit.  Visit Diagnosis:  Difficulty walking - Plan: PT plan of care cert/re-cert  Abnormality of gait - Plan: PT plan of care cert/re-cert  Weakness of both lower extremities - Plan: PT plan of care cert/re-cert      Subjective Assessment - 02/12/15 1310    Pertinent History Patien thas had a 5 year history of known MS since 2010. Patient states recieving steroid shots that benefited him but has had pneumonia 3x in last 5 years exacerbating weakness. Patient contracted Sarcoidosis in August 2013 resulting in difficulty walking and using legs ever  since. patient states he was refferred to PT by Dr. Gerilyn Pilgrim to assess his strength and flexibility to help his walking. Patient notes Rt leg seems to be really stiff secondary to spasticity and cramping that is especially pronounced at night. No pain today. not4es occasioanl pain every day in Rt lower leg, he thought it was diabetic pain, but patient does not have diabetes and he believes per MD that it is a neurological pain related to MS. Patient notes weakness from hips down.  and weakness in Rt hand that has just recently started in January. Notes numbness from Rt arm to /through Rt LE. no numbness in Lt Leg. "My left leg was the weaker but i have found its stronger now. "  Patient has hand weights and bands at home that he uses for strengthening.    How long can you walk comfortably? maybe 5 minutes with a walker.    Currently in Pain? No/denies   Pain Location Calf   Pain Orientation Right;Left;Lower;Posterior   Pain Descriptors / Indicators Cramping   Pain Type Chronic pain   Aggravating Factors  pain worse at night and in mornign and improves with stretching.    Pain Relieving Factors no pain any where else.    Multiple Pain Sites No            [facility] PT Assessment - 02/12/15 0001    Assessment   Medical Diagnosis MS and unsteady gait  Onset Date 02/11/10   Next MD Visit late july Chad Vasquez   Prior Therapy yes   Balance Screen   Has the patient fallen in the past 6 months Yes   How many times? 1   Has the patient had a decrease in activity level because of a fear of falling?  No   Is the patient reluctant to leave their home because of a fear of falling?  No   Prior Function   Level of Independence Needs assistance with homemaking   Meal Prep Minimal   Gardening Minimal   Vocation On disability   Functional Tests   Functional tests Sit to Stand;Other2;Other   Sit to Stand   Comments with UE support independent   ROM / Strength   AROM / PROM / Strength AROM;Strength    AROM   Overall AROM Comments Knee WNL,    AROM Assessment Site Ankle;Hip;Knee   Right Ankle Dorsiflexion -20   Left Ankle Dorsiflexion -14   Strength   Strength Assessment Site Ankle;Knee;Hip;Shoulder   Right/Left Shoulder Right   Right/Left Hip Right;Left   Right Hip Flexion 2+/5   Right Hip ABduction 2-/5   Left Hip Flexion 2+/5   Left Hip ABduction 2-/5   Right/Left Knee Right;Left   Right Knee Flexion 4-/5   Right Knee Extension 4/5   Left Knee Flexion 4-/5   Left Knee Extension 4/5   Right/Left Ankle Right;Left   Right Ankle Dorsiflexion 1/5   Left Ankle Dorsiflexion 4-/5   Ambulation/Gait   Gait Comments difficulty straighthening knees an dtrendelenberg gait bilaterally, limmited ankle dorsiflexion, limited trunk rotation requires walker for support.                    [facility] Adult PT Treatment/Exercise - 02/12/15 0001    Exercises   Exercises Knee/Hip   Knee/Hip Exercises: Stretches   Gastroc Stretch 2 reps;20 seconds   Knee/Hip Exercises: Standing   Functional Squat Limitations 3D hip excursions 10x   Knee/Hip Exercises: Supine   Heel Slides 1 set;20 reps   Straight Leg Raises 10 reps   Other Supine Knee Exercises trunkrotations   Knee/Hip Exercises: Sidelying   Clams 10x                PT Education - 02/12/15 1410    Education provided Yes   Education Details HEP, see patient insructions, or exercises performed   Person(s) Educated Patient;Spouse   Methods Explanation;Demonstration;Handout   Comprehension Verbalized understanding;Returned demonstration          PT Short Term Goals - 02/12/15 1428    PT SHORT TERM GOAL #1   Title Patrient will dmeonstrate increased hip abduction strength bilaterally to 2+ to be able to lift legs so patient can ambulate decreased trendelenberg gait   Time 4   Period Weeks   Status New   PT SHORT TERM GOAL #2   Title Patint will dmoenstrate increase hip flexion stength to 2+/5 so patient can lift hips  90 degrees to decrease foot drag durign gait.    Time 4   Period Weeks   Status New   PT SHORT TERM GOAL #3   Title Patient will demonstrate bilateral ankle dorsiflexion 2+/5 MMT to be abel to ambuate wihtout toe drag during gait.    Time 4   Period Weeks   Status New   PT SHORT TERM GOAL #4   Title I with HEP for progression of LE strength.    Time 4  Period Weeks   Status New           PT Long Term Goals - 02/15/15 1639    PT LONG TERM GOAL #1   Title Patrient will dmeonstrate increased hip abduction strength bilaterally to 3+/5 to be able to lift legs so patient can ambulate decreased trendelenberg gait   Time 8   Period Weeks   Status New   PT LONG TERM GOAL #2   Title Patint will dmoenstrate increase hip flexion stength to 3+/5 so patient can lift hips 90 degrees to decrease foot drag durign gait.   Time 8   Period Weeks   Status New   PT LONG TERM GOAL #3   Title Patint will be bale to perform sit to stand without UE support   Time 8   Period Weeks   Status New   PT LONG TERM GOAL #4   Title patient will be able to ambualte withtou walker without a rest break.    Time 8   Period Weeks   Status New               Plan - 2015-02-15 1422    Clinical Impression Statement Patient dispalsy severe LE weakness with Rt weaker than Lt resulting in limited walking endurance with walker secodnary to a history of Multiple schlerosis and Sarcoidosis. Patient specifically dmoenstrateds bilateral foot drag with Rt worse than left attributed to Rt ankle dorsiflexion weakness greater than lt and bilateraly hip flexor and abductor weakness resulting in limited ability to lift LE durign swing phase of gait and bilateral excessive trendelenberg gait. Patient will benefit from skilled phsyical therapy to increase bilateral LE strength so patient can more easily ambualte with walker for greater distances to be able to ambualet short distances in the community.    Pt will  benefit from skilled therapeutic intervention in order to improve on the following deficits Abnormal gait;Decreased strength;Difficulty walking;Decreased mobility;Decreased balance;Improper body mechanics   Rehab Potential Fair   Clinical Impairments Affecting Rehab Potential Multiple sclerosis and Sarcoidosis.    PT Frequency 2x / week   PT Duration 8 weeks   PT Treatment/Interventions Therapeutic exercise;Balance training;Gait training;Patient/family education;Functional mobility training   PT Next Visit Plan Introduce bridges, split stance hip excursions, calf raises and review HEP.    PT Home Exercise Plan bent knee raise/heel slide, trunk rotation, clams, 3D hip excursions, calf stretch.    Consulted and Agree with Plan of Care Patient          G-Codes - Feb 15, 2015 1642    Functional Assessment Tool Used FOTO 61% limited    Functional Limitation Mobility: Walking and moving around   Mobility: Walking and Moving Around Current Status [phone]) At least 60 percent but less than 80 percent impaired, limited or restricted   Mobility: Walking and Moving Around Goal Status [phone]) At least 40 percent but less than 60 percent impaired, limited or restricted       Problem List Patient Active Problem List   Diagnosis Date Noted  . Hyperlipidemia 08/13/2013  . Venous insufficiency 08/13/2013  . CAD (coronary artery disease) 08/13/2013  . MS (multiple sclerosis) 08/03/2013  . Spasticity 08/03/2013  . Obstructive sleep apnea 11/29/2012  . Sarcoidosis 05/19/2012  . Paraplegia 05/19/2012  . Multiple sclerosis exacerbation 05/07/2012  . Fever 05/07/2012  . HTN (hypertension) 05/07/2012  . COPD (chronic obstructive pulmonary disease) 05/07/2012  . Chronic pain 05/07/2012  . Polysubstance abuse 05/07/2012  . Lower extremity pain 05/07/2012  Jerilee Field PT DPT [phone]  [facility] [address] [place], [place], [address] Phone:  [phone]   Fax:  [phone]

## 2015-02-17 ENCOUNTER — Ambulatory Visit (HOSPITAL_COMMUNITY): Payer: Medicare Other

## 2015-02-17 DIAGNOSIS — R262 Difficulty in walking, not elsewhere classified: Secondary | ICD-10-CM | POA: Diagnosis not present

## 2015-02-17 DIAGNOSIS — R269 Unspecified abnormalities of gait and mobility: Secondary | ICD-10-CM

## 2015-02-17 DIAGNOSIS — R29898 Other symptoms and signs involving the musculoskeletal system: Secondary | ICD-10-CM

## 2015-02-17 NOTE — Therapy (Signed)
[facility] [address] [place], [place], [address] Phone: [phone]   Fax:  [phone]  Physical Therapy Treatment  Patient Details  Name: Chad Vasquez MRN: [id number] Date of Birth: Feb 26, 1961 Referring Provider:  Assunta Found, MD  Encounter Date: 02/17/2015      PT End of Session - 02/17/15 1435    Visit Number 2   Number of Visits 16   Date for PT Re-Evaluation 03/14/15   Authorization Type Medicare   Authorization Time Period 02/12/15-04/23/15   Authorization - Visit Number 2   Authorization - Number of Visits 10   PT Start Time 1350   PT Stop Time 1430   PT Time Calculation (min) 40 min   Equipment Utilized During Treatment Gait belt   Activity Tolerance Patient tolerated treatment well   Behavior During Therapy [facility] for tasks assessed/performed      Past Medical History  Diagnosis Date  . Multiple sclerosis 2010  . HTN (hypertension)   . Polysubstance abuse   . Anxiety and depression   . Chronic pain     Past Surgical History  Procedure Laterality Date  . Mediastinoscopy  05/15/2012    Procedure: MEDIASTINOSCOPY;  Surgeon: Loreli Slot, MD;  Location: [facility] OR;  Service: Thoracic;  Laterality: N/A;  . Cholecystectomy N/A 04/16/2013    Procedure: LAPAROSCOPIC CHOLECYSTECTOMY;  Surgeon: Dalia Heading, MD;  Location: [facility];  Service: General;  Laterality: N/A;    There were no vitals filed for this visit.  Visit Diagnosis:  Difficulty walking  Abnormality of gait  Weakness of both lower extremities      Subjective Assessment - 02/17/15 1403    Subjective Pt stated Rt LE pain scale 3-4/10   Pertinent History Patien thas had a 5 year history of known MS since 2010. Patient states recieving steroid shots that benefited him but has had pneumonia 3x in last 5 years exacerbating weakness. Patient contracted Sarcoidosis in August 2013 resulting in difficulty walking and using legs ever since. patient states  he was refferred to PT by Dr. Gerilyn Pilgrim to assess his strength and flexibility to help his walking. Patient notes Rt leg seems to be really stiff secondary to spasticity and cramping that is especially pronounced at night. No pain today. not4es occasioanl pain every day in Rt lower leg, he thought it was diabetic pain, but patient does not have diabetes and he believes per MD that it is a neurological pain related to MS. Patient notes weakness from hips down.  and weakness in Rt hand that has just recently started in January. Notes numbness from Rt arm to /through Rt LE. no numbness in Lt Leg. "My left leg was the weaker but i have found its stronger now. "  Patient has hand weights and bands at home that he uses for strengthening.    Currently in Pain? Yes   Pain Location Leg   Pain Orientation Right   Pain Descriptors / Indicators Numbness          [facility] Adult PT Treatment/Exercise - 02/17/15 0001    Exercises   Exercises Knee/Hip   Knee/Hip Exercises: Standing   Heel Raises 10 reps   Functional Squat Limitations 3D hip excursions 10x split stance   Gait Training 4x 27ft RW min guard   Knee/Hip Exercises: Supine   Heel Slides 1 set;20 reps   Bridges Both;10 reps;2 sets   Straight Leg Raises AAROM;Both;10 reps   Other Supine Knee Exercises trunkrotations  Other Supine Knee Exercises clam 10x each            PT Short Term Goals - 02/17/15 1436    PT SHORT TERM GOAL #1   Title Patrient will dmeonstrate increased hip abduction strength bilaterally to 2+ to be able to lift legs so patient can ambulate decreased trendelenberg gait   PT SHORT TERM GOAL #2   Title Patint will dmoenstrate increase hip flexion stength to 2+/5 so patient can lift hips 90 degrees to decrease foot drag durign gait.    Status On-going   PT SHORT TERM GOAL #3   Title Patient will demonstrate bilateral ankle dorsiflexion 2+/5 MMT to be abel to ambuate wihtout toe drag during gait.    Status On-going   PT  SHORT TERM GOAL #4   Title I with HEP for progression of LE strength.    Status On-going           PT Long Term Goals - 02/17/15 1437    PT LONG TERM GOAL #1   Title Patrient will dmeonstrate increased hip abduction strength bilaterally to 3+/5 to be able to lift legs so patient can ambulate decreased trendelenberg gait   PT LONG TERM GOAL #2   Title Patint will dmoenstrate increase hip flexion stength to 3+/5 so patient can lift hips 90 degrees to decrease foot drag durign gait.   PT LONG TERM GOAL #3   Title Patint will be bale to perform sit to stand without UE support   PT LONG TERM GOAL #4   Title patient will be able to ambualte withtou walker without a rest break.                Plan - 02/17/15 1513    Clinical Impression Statement Reviewed goals, HEP techniques and pt. given copy of evaluation.  Session focus on LE strengthening with therapist facilitation for proper loading with squats and bridges for maximal strengthening.  Pt limited by musculature fatigue requiring seated rest breaks.   PT Next Visit Plan Continue with current PT POC with split stance hip excursion, heel raises, bridges.  Progress standing exercises as able.        Problem List Patient Active Problem List   Diagnosis Date Noted  . Hyperlipidemia 08/13/2013  . Venous insufficiency 08/13/2013  . CAD (coronary artery disease) 08/13/2013  . MS (multiple sclerosis) 08/03/2013  . Spasticity 08/03/2013  . Obstructive sleep apnea 11/29/2012  . Sarcoidosis 05/19/2012  . Paraplegia 05/19/2012  . Multiple sclerosis exacerbation 05/07/2012  . Fever 05/07/2012  . HTN (hypertension) 05/07/2012  . COPD (chronic obstructive pulmonary disease) 05/07/2012  . Chronic pain 05/07/2012  . Polysubstance abuse 05/07/2012  . Lower extremity pain 05/07/2012   Becky Sax, [place]; [place] #[id number] [phone]  Juel Burrow 02/17/2015, 3:20 PM  [place] [address] [place], [place], [address] Phone: [phone]   Fax:  [phone]

## 2015-02-24 ENCOUNTER — Encounter (HOSPITAL_COMMUNITY)
Admission: RE | Admit: 2015-02-24 | Discharge: 2015-02-24 | Disposition: A | Discharge status: Home / Self Care | Payer: Medicare Other | Source: Ambulatory Visit | Attending: Neurology | Admitting: Neurology

## 2015-02-24 DIAGNOSIS — G35 Multiple sclerosis: Secondary | ICD-10-CM | POA: Diagnosis not present

## 2015-02-24 MED ORDER — SODIUM CHLORIDE 0.9 % IV SOLN
Freq: Once | INTRAVENOUS | Status: AC
  Administered 2015-02-24: 200 mL via INTRAVENOUS

## 2015-02-24 MED ORDER — SODIUM CHLORIDE 0.9 % IV SOLN
1000.0000 mg | Freq: Once | INTRAVENOUS | Status: AC
  Administered 2015-02-24: 1000 mg via INTRAVENOUS
  Filled 2015-02-24: qty 8

## 2015-02-24 MED ORDER — METHYLPREDNISOLONE SODIUM SUCC 125 MG IJ SOLR: 1000.0000 mg | Freq: Once | INTRAMUSCULAR | Status: DC

## 2015-02-25 ENCOUNTER — Encounter (HOSPITAL_COMMUNITY)
Admission: RE | Admit: 2015-02-25 | Discharge: 2015-02-25 | Disposition: A | Discharge status: Home / Self Care | Payer: Medicare Other | Source: Ambulatory Visit | Attending: Neurology | Admitting: Neurology

## 2015-02-25 DIAGNOSIS — G35 Multiple sclerosis: Secondary | ICD-10-CM | POA: Diagnosis not present

## 2015-02-25 MED ORDER — METHYLPREDNISOLONE SODIUM SUCC 125 MG IJ SOLR
1000.0000 mg | INTRAMUSCULAR | Status: DC
Start: 2015-02-25 — End: 2015-02-25

## 2015-02-25 MED ORDER — SODIUM CHLORIDE 0.9 % IV SOLN
1000.0000 mg | Freq: Once | INTRAVENOUS | Status: AC
  Administered 2015-02-25: 1000 mg via INTRAVENOUS
  Filled 2015-02-25: qty 8

## 2015-02-25 MED ORDER — SODIUM CHLORIDE 0.9 % IV SOLN: Freq: Once | INTRAVENOUS | Status: DC

## 2015-02-25 MED ORDER — SODIUM CHLORIDE 0.9 % IV SOLN
INTRAVENOUS | Status: DC
  Administered 2015-02-25: 250 mL via INTRAVENOUS

## 2015-02-26 ENCOUNTER — Encounter (HOSPITAL_COMMUNITY)
Admission: RE | Admit: 2015-02-26 | Discharge: 2015-02-26 | Disposition: A | Discharge status: Home / Self Care | Payer: Medicare Other | Source: Ambulatory Visit | Attending: Neurology | Admitting: Neurology

## 2015-02-26 DIAGNOSIS — G35 Multiple sclerosis: Secondary | ICD-10-CM | POA: Diagnosis not present

## 2015-02-26 MED ORDER — METHYLPREDNISOLONE SODIUM SUCC 125 MG IJ SOLR: 1000.0000 mg | Freq: Once | INTRAMUSCULAR | Status: DC

## 2015-02-26 MED ORDER — SODIUM CHLORIDE 0.9 % IV SOLN: INTRAVENOUS | Status: DC

## 2015-02-26 MED ORDER — SODIUM CHLORIDE 0.9 % IV SOLN
1000.0000 mg | Freq: Once | INTRAVENOUS | Status: AC
  Administered 2015-02-26: 1000 mg via INTRAVENOUS
  Filled 2015-02-26: qty 8

## 2015-02-27 ENCOUNTER — Encounter (HOSPITAL_COMMUNITY)
Admission: RE | Admit: 2015-02-27 | Discharge: 2015-02-27 | Disposition: A | Discharge status: Home / Self Care | Payer: Medicare Other | Source: Ambulatory Visit | Attending: Neurology | Admitting: Neurology

## 2015-02-27 ENCOUNTER — Encounter (HOSPITAL_COMMUNITY): Payer: Self-pay

## 2015-02-27 DIAGNOSIS — G35 Multiple sclerosis: Secondary | ICD-10-CM | POA: Diagnosis not present

## 2015-02-27 MED ORDER — SODIUM CHLORIDE 0.9 % IV SOLN
INTRAVENOUS | Status: DC
  Administered 2015-02-27: 250 mL via INTRAVENOUS

## 2015-02-27 MED ORDER — SODIUM CHLORIDE 0.9 % IV SOLN
1000.0000 mg | Freq: Once | INTRAVENOUS | Status: AC
  Administered 2015-02-27: 1000 mg via INTRAVENOUS
  Filled 2015-02-27: qty 8

## 2015-02-28 ENCOUNTER — Encounter (HOSPITAL_COMMUNITY)
Admission: RE | Admit: 2015-02-28 | Discharge: 2015-02-28 | Disposition: A | Discharge status: Home / Self Care | Payer: Medicare Other | Source: Ambulatory Visit | Attending: Neurology | Admitting: Neurology

## 2015-02-28 ENCOUNTER — Encounter (HOSPITAL_COMMUNITY): Payer: Self-pay

## 2015-02-28 DIAGNOSIS — G35 Multiple sclerosis: Secondary | ICD-10-CM | POA: Diagnosis not present

## 2015-02-28 MED ORDER — SODIUM CHLORIDE 0.9 % IV SOLN
Freq: Once | INTRAVENOUS | Status: AC
  Administered 2015-02-28: 250 mL via INTRAVENOUS

## 2015-02-28 MED ORDER — SODIUM CHLORIDE 0.9 % IV SOLN
1000.0000 mg | Freq: Once | INTRAVENOUS | Status: AC
  Administered 2015-02-28: 1000 mg via INTRAVENOUS
  Filled 2015-02-28: qty 8

## 2015-03-05 ENCOUNTER — Encounter (HOSPITAL_COMMUNITY): Payer: Medicare Other

## 2015-03-19 ENCOUNTER — Ambulatory Visit (HOSPITAL_COMMUNITY): Payer: Medicare Other | Attending: Neurology | Admitting: Physical Therapy

## 2015-03-19 DIAGNOSIS — Z7409 Other reduced mobility: Secondary | ICD-10-CM

## 2015-03-19 DIAGNOSIS — R29898 Other symptoms and signs involving the musculoskeletal system: Secondary | ICD-10-CM | POA: Diagnosis not present

## 2015-03-19 DIAGNOSIS — R262 Difficulty in walking, not elsewhere classified: Secondary | ICD-10-CM | POA: Insufficient documentation

## 2015-03-19 DIAGNOSIS — R269 Unspecified abnormalities of gait and mobility: Secondary | ICD-10-CM

## 2015-03-19 NOTE — Therapy (Signed)
[facility] [address] [place], [place], [address] Phone: [phone]   Fax:  [phone]  Physical Therapy Treatment (Re-Assessment)  Patient Details  Name: Chad Vasquez MRN: [id number] Date of Birth: 03/04/1961 Referring Provider:  Beryle Beams, MD  Encounter Date: 03/19/2015      PT End of Session - 03/19/15 1607    Visit Number 3   Number of Visits 16   Date for PT Re-Evaluation 04/16/15   Authorization Type Medicare   Authorization Time Period 02/12/15-04/23/15; G-code done 3rd visit    Authorization - Visit Number 3   Authorization - Number of Visits 10   PT Start Time 1303   PT Stop Time 1348   PT Time Calculation (min) 45 min   Equipment Utilized During Treatment Gait belt   Activity Tolerance Patient tolerated treatment well   Behavior During Therapy [facility] for tasks assessed/performed      Past Medical History  Diagnosis Date  . Multiple sclerosis 2010  . HTN (hypertension)   . Polysubstance abuse   . Anxiety and depression   . Chronic pain     Past Surgical History  Procedure Laterality Date  . Mediastinoscopy  05/15/2012    Procedure: MEDIASTINOSCOPY;  Surgeon: Loreli Slot, MD;  Location: [facility] OR;  Service: Thoracic;  Laterality: N/A;  . Cholecystectomy N/A 04/16/2013    Procedure: LAPAROSCOPIC CHOLECYSTECTOMY;  Surgeon: Dalia Heading, MD;  Location: [facility] ORS;  Service: General;  Laterality: N/A;    There were no vitals filed for this visit.  Visit Diagnosis:  Difficulty walking  Abnormality of gait  Weakness of both lower extremities  Impaired functional mobility and activity tolerance      Subjective Assessment - 03/19/15 1602    Subjective Patient states one of his biggest challenges is the spasticity in his R LE especially, states that it often goes "straiight as a board" and really affects his moving especially when walking. Reports he had a long break from therapy because he had an  exacerbation of MS and feels that his R LE is even weaker since then.    Pertinent History Patien thas had a 5 year history of known MS since 2010. Patient states recieving steroid shots that benefited him but has had pneumonia 3x in last 5 years exacerbating weakness. Patient contracted Sarcoidosis in August 2013 resulting in difficulty walking and using legs ever since. patient states he was refferred to PT by Dr. Gerilyn Pilgrim to assess his strength and flexibility to help his walking. Patient notes Rt leg seems to be really stiff secondary to spasticity and cramping that is especially pronounced at night. No pain today. not4es occasioanl pain every day in Rt lower leg, he thought it was diabetic pain, but patient does not have diabetes and he believes per MD that it is a neurological pain related to MS. Patient notes weakness from hips down.  and weakness in Rt hand that has just recently started in January. Notes numbness from Rt arm to /through Rt LE. no numbness in Lt Leg. "My left leg was the weaker but i have found its stronger now. "  Patient has hand weights and bands at home that he uses for strengthening.    How long can you walk comfortably? maybe 5 minutes with a walker.    Currently in Pain? No/denies            [facility] PT Assessment - 03/19/15 0001    AROM   Right  Ankle Dorsiflexion -14   Left Ankle Dorsiflexion -10   Strength   Right Hip Flexion 2-/5   Right Hip ABduction 2-/5   Left Hip Flexion 2+/5   Left Hip ABduction 2+/5   Right Knee Flexion 4-/5   Right Knee Extension 4+/5  spasticity more than strength    Left Knee Flexion 4-/5   Left Knee Extension 4/5   Right Ankle Dorsiflexion 2-/5   Left Ankle Dorsiflexion 4-/5   Bed Mobility   Rolling Right 6: Modified independent (Device/Increase time)   Rolling Left 6: Modified independent (Device/Increase time)   Supine to Sit 6: Modified independent (Device/Increase time)   Sit to Supine 6: Modified independent  (Device/Increase time)   Transfers   Sit to Stand 6: Modified independent (Device/Increase time)   Stand to Sit 6: Modified independent (Device/Increase time)   Stand Pivot Transfers 6: Modified independent (Device/Increase time)   Ambulation/Gait   Ambulation/Gait Yes   Ambulation/Gait Assistance 4: Min guard   Ambulation Distance (Feet) 120 Feet   Assistive device Standard walker   Gait Comments extension spasticity R LE, reduced DF bilaterally, flexed at trunk, reduced gait speed, dragging of R foot, reduced hip/knee flexion R LE                              PT Education - 03/19/15 1606    Education provided Yes   Education Details revised some goals, plan of care moving forward    Person(s) Educated Patient   Methods Explanation   Comprehension Verbalized understanding          PT Short Term Goals - 03/19/15 1338    PT SHORT TERM GOAL #1   Title Patrient will dmeonstrate increased hip abduction strength bilaterally to 2+ to be able to lift legs so patient can ambulate decreased trendelenberg gait   Time 4   Period Weeks   Status On-going   PT SHORT TERM GOAL #2   Title Patint will dmoenstrate increase hip flexion stength to 2+/5 so patient can lift hips 90 degrees to decrease foot drag durign gait.    Time 4   Period Weeks   Status On-going   PT SHORT TERM GOAL #3   Title Patient will demonstrate bilateral ankle dorsiflexion 2+/5 MMT to be abel to ambuate wihtout toe drag during gait.    Time 4   Period Weeks   Status On-going   PT SHORT TERM GOAL #4   Title I with HEP for progression of LE strength.    Time 4   Period Weeks   Status On-going           PT Long Term Goals - 03/19/15 1341    PT LONG TERM GOAL #1   Title Patrient will dmeonstrate increased hip abduction strength bilaterally to 3+/5 to be able to lift legs so patient can ambulate decreased trendelenberg gait   Time 8   Period Weeks   Status On-going   PT LONG TERM GOAL #2    Title Patint will dmoenstrate increase hip flexion stength to 3+/5 so patient can lift hips 90 degrees to decrease foot drag durign gait.   Time 8   Period Weeks   Status On-going   PT LONG TERM GOAL #3   Title Patint will be bale to perform sit to stand without UE support   Time 8   Period Weeks   Status On-going   PT LONG TERM  GOAL #4   Title Patient will be able to ambulate at least 556ft with LRAD and minimal fatigue, no rest break    Time 8   Period Weeks   Status Revised   PT LONG TERM GOAL #5   Title Patient will demonstrate improved hip and low back mobility, also reduced tone R LE, as evidenced by increased ease with bed mobility and transfers    Time 8   Period Weeks   Status New               Plan - Apr 09, 2015 1607    Clinical Impression Statement Re-assessment performed today. Patient continues to demonstrate significant gait and postural deviations, weakness and spasticity especially R LE, bialteral ankle and hip  stiffness, dependence on assistive device, reduced functional activity tolerance, and diffiiculty with functional mobility. THe patient recently experienced an exacerbation of MS symptoms and as is just now returning to treatments; he reports that he feels like his R LE is weaker and may have more spasticity after this exacerbation. Patient will benefit from continuation of skilled PT services, at 2x/week for 8 more weeks, to continue to address these deficits and assist him in reaching optimal level of function.    Pt will benefit from skilled therapeutic intervention in order to improve on the following deficits Abnormal gait;Decreased strength;Difficulty walking;Decreased mobility;Decreased balance;Improper body mechanics   Rehab Potential Fair   Clinical Impairments Affecting Rehab Potential Multiple sclerosis and Sarcoidosis.    PT Frequency 2x / week   PT Duration 8 weeks   PT Treatment/Interventions Therapeutic exercise;Balance training;Gait  training;Patient/family education;Functional mobility training   PT Next Visit Plan Continue with current PT POC with split stance hip excursion, heel raises, bridges.  Progress standing exercises as able.   PT Home Exercise Plan bent knee raise/heel slide, trunk rotation, clams, 3D hip excursions, calf stretch.    Consulted and Agree with Plan of Care Patient          G-Codes - 04/09/2015 1612    Functional Assessment Tool Used FOTO 55% limited    Functional Limitation Mobility: Walking and moving around   Mobility: Walking and Moving Around Current Status [phone]) At least 40 percent but less than 60 percent impaired, limited or restricted   Mobility: Walking and Moving Around Goal Status [phone]) At least 20 percent but less than 40 percent impaired, limited or restricted      Problem List Patient Active Problem List   Diagnosis Date Noted  . Hyperlipidemia 08/13/2013  . Venous insufficiency 08/13/2013  . CAD (coronary artery disease) 08/13/2013  . MS (multiple sclerosis) 08/03/2013  . Spasticity 08/03/2013  . Obstructive sleep apnea 11/29/2012  . Sarcoidosis 05/19/2012  . Paraplegia 05/19/2012  . Multiple sclerosis exacerbation 05/07/2012  . Fever 05/07/2012  . HTN (hypertension) 05/07/2012  . COPD (chronic obstructive pulmonary disease) 05/07/2012  . Chronic pain 05/07/2012  . Polysubstance abuse 05/07/2012  . Lower extremity pain 05/07/2012   Physical Therapy Progress Note  Dates of Reporting Period: 02/12/15 to 2015/04/09  Objective Reports of Subjective Statement: Patient recently experienced exacerbation of MS and reports that his R LE is feeling weaker and possibly more spasticity there.   Objective Measurements: See above   Goal Update: Revised a LTG and added one LTG; see above   Plan: See above   Reason Skilled Services are Required: Patient continues to demonstrate significant gait and postural deviations, weakness and spasticity especially R LE, bialteral  ankle and hip  stiffness, dependence on  assistive device, reduced functional activity tolerance, and diffiiculty with functional mobility.     Nedra Hai PT, DPT [phone]  [facility] [address] [place], [place], [address] Phone: [phone]   Fax:  [phone]

## 2015-04-01 ENCOUNTER — Encounter: Payer: Self-pay | Admitting: *Deleted

## 2015-04-02 ENCOUNTER — Ambulatory Visit (HOSPITAL_COMMUNITY): Payer: Medicare Other

## 2015-04-02 DIAGNOSIS — Z7409 Other reduced mobility: Secondary | ICD-10-CM

## 2015-04-02 DIAGNOSIS — R262 Difficulty in walking, not elsewhere classified: Secondary | ICD-10-CM

## 2015-04-02 DIAGNOSIS — R29898 Other symptoms and signs involving the musculoskeletal system: Secondary | ICD-10-CM

## 2015-04-02 DIAGNOSIS — R269 Unspecified abnormalities of gait and mobility: Secondary | ICD-10-CM

## 2015-04-02 NOTE — Therapy (Signed)
[place] [address] [facility] [address] [place], [place], [address] Phone: [phone]   Fax:  [phone]  Physical Therapy Treatment  Patient Details  Name: Chad Vasquez MRN: [id number] Date of Birth: 21-Aug-1961 Referring Provider:  Beryle Beams, MD  Encounter Date: 04/02/2015      PT End of Session - 04/02/15 1350    Visit Number 4   Number of Visits 16   Date for PT Re-Evaluation 04/16/15   Authorization Type Medicare   Authorization Time Period 02/12/15-04/23/15; G-code done 3rd visit    Authorization - Visit Number 4   Authorization - Number of Visits 10   PT Start Time 1303   PT Stop Time 1350   PT Time Calculation (min) 47 min   Equipment Utilized During Treatment Gait belt   Activity Tolerance Patient limited by fatigue;Patient tolerated treatment well   Behavior During Therapy [facility] for tasks assessed/performed      Past Medical History  Diagnosis Date  . Multiple sclerosis 2010  . HTN (hypertension)   . Polysubstance abuse   . Anxiety and depression   . Chronic pain   . Coronary artery disease   . Hyperlipidemia   . Sleep apnea     Past Surgical History  Procedure Laterality Date  . Mediastinoscopy  05/15/2012    Procedure: MEDIASTINOSCOPY;  Surgeon: Loreli Slot, MD;  Location: [facility] OR;  Service: Thoracic;  Laterality: N/A;  . Cholecystectomy N/A 04/16/2013    Procedure: LAPAROSCOPIC CHOLECYSTECTOMY;  Surgeon: Dalia Heading, MD;  Location: [facility]S;  Service: General;  Laterality: N/A;  . Cardiac catheterization  2005    There were no vitals filed for this visit.  Visit Diagnosis:  Difficulty walking  Abnormality of gait  Weakness of both lower extremities  Impaired functional mobility and activity tolerance      Subjective Assessment - 04/02/15 1313    Subjective Pt stated he has been walking around the house as much as he can until the legs tighten up.  Biggest problem he is currently having with Rt LE  spasticity and falling asleep often, blood work has been done   Currently in Pain? No/denies              [facility] Adult PT Treatment/Exercise - 04/02/15 0001    Ambulation/Gait   Ambulation/Gait Yes   Ambulation/Gait Assistance 4: Min guard   Ambulation Distance (Feet) 220 Feet   Assistive device Standard walker   Gait Comments extension spasticity R LE, reduced DF bilaterally, flexed at trunk, reduced gait speed, dragging of R foot, reduced hip/knee flexion R LE    Exercises   Exercises Knee/Hip   Knee/Hip Exercises: Standing   Functional Squat Limitations 3D hip excursions 10x split stance   Knee/Hip Exercises: Seated   Other Seated Knee/Hip Exercises ankle pumps 10x   Knee/Hip Exercises: Supine   Heel Slides 1 set;20 reps;AAROM   Bridges Both;10 reps;2 sets   Other Supine Knee/Hip Exercises trunkrotations   Other Supine Knee/Hip Exercises clam 10x each                  PT Short Term Goals - 04/02/15 1416    PT SHORT TERM GOAL #1   Title Patrient will dmeonstrate increased hip abduction strength bilaterally to 2+ to be able to lift legs so patient can ambulate decreased trendelenberg gait   PT SHORT TERM GOAL #2   Title Patint will dmoenstrate increase hip flexion stength to 2+/5 so patient can  lift hips 90 degrees to decrease foot drag durign gait.    Status On-going   PT SHORT TERM GOAL #3   Title Patient will demonstrate bilateral ankle dorsiflexion 2+/5 MMT to be abel to ambuate wihtout toe drag during gait.    Status On-going   PT SHORT TERM GOAL #4   Title I with HEP for progression of LE strength.    Status On-going           PT Long Term Goals - 04/02/15 1417    PT LONG TERM GOAL #1   Title Patrient will dmeonstrate increased hip abduction strength bilaterally to 3+/5 to be able to lift legs so patient can ambulate decreased trendelenberg gait   PT LONG TERM GOAL #2   Title Patint will dmoenstrate increase hip flexion stength to 3+/5 so patient  can lift hips 90 degrees to decrease foot drag durign gait.   PT LONG TERM GOAL #3   Title Patint will be bale to perform sit to stand without UE support   PT LONG TERM GOAL #4   Title Patient will be able to ambulate at least 525ft with LRAD and minimal fatigue, no rest break    PT LONG TERM GOAL #5   Title Patient will demonstrate improved hip and low back mobility, also reduced tone R LE, as evidenced by increased ease with bed mobility and transfers                Plan - 04/02/15 1406    Clinical Impression Statement Increased distance gait training with min guard, c/o increased spasticity half way through with increased difficulty progressing Rt LE forwards.  Pt demonstrates shuffling and dragging due to weakness Rt LE, dependent on RW to reduce risk of falls.  No LOB episodes through gait training today, just very fatigue.  Therex focus on  strengtehning with standing excursion to improve hip mobilty and  mat activities to improve muscle activation and strengthening with AAROM required for Rt LE. No reports of pain through session.   PT Next Visit Plan Continue with current PT POC with split stance hip excursion, heel raises, bridges.  Progress standing exercises as able.        Problem List Patient Active Problem List   Diagnosis Date Noted  . Hyperlipidemia 08/13/2013  . Venous insufficiency 08/13/2013  . CAD (coronary artery disease) 08/13/2013  . MS (multiple sclerosis) 08/03/2013  . Spasticity 08/03/2013  . Obstructive sleep apnea 11/29/2012  . Sarcoidosis 05/19/2012  . Paraplegia 05/19/2012  . Multiple sclerosis exacerbation 05/07/2012  . Fever 05/07/2012  . HTN (hypertension) 05/07/2012  . COPD (chronic obstructive pulmonary disease) 05/07/2012  . Chronic pain 05/07/2012  . Polysubstance abuse 05/07/2012  . Lower extremity pain 05/07/2012    Juel Burrow 04/02/2015, 2:43 PM  [facility] [address]  [place], [place], [address] Phone: [phone]   Fax:  [phone]

## 2015-04-09 ENCOUNTER — Other Ambulatory Visit (HOSPITAL_COMMUNITY): Payer: Self-pay | Admitting: Respiratory Therapy

## 2015-04-09 DIAGNOSIS — G4710 Hypersomnia, unspecified: Secondary | ICD-10-CM

## 2015-04-09 DIAGNOSIS — G4733 Obstructive sleep apnea (adult) (pediatric): Secondary | ICD-10-CM

## 2015-04-16 ENCOUNTER — Ambulatory Visit (HOSPITAL_COMMUNITY): Payer: Medicare Other | Attending: Neurology | Admitting: Physical Therapy

## 2015-04-16 ENCOUNTER — Other Ambulatory Visit (HOSPITAL_COMMUNITY): Payer: Self-pay | Admitting: Respiratory Therapy

## 2015-04-16 DIAGNOSIS — R262 Difficulty in walking, not elsewhere classified: Secondary | ICD-10-CM

## 2015-04-16 DIAGNOSIS — R269 Unspecified abnormalities of gait and mobility: Secondary | ICD-10-CM | POA: Insufficient documentation

## 2015-04-16 DIAGNOSIS — R29898 Other symptoms and signs involving the musculoskeletal system: Secondary | ICD-10-CM | POA: Insufficient documentation

## 2015-04-16 DIAGNOSIS — Z7409 Other reduced mobility: Secondary | ICD-10-CM | POA: Diagnosis present

## 2015-04-16 NOTE — Therapy (Signed)
[facility] [address] [place], [place], [address] Phone: [phone]   Fax:  [phone]  Physical Therapy Treatment  Patient Details  Name: Chad Vasquez MRN: [id number] Date of Birth: 09/11/1961 Referring Provider:  Beryle Beams, MD  Encounter Date: 04/16/2015      PT End of Session - 04/16/15 1415    Visit Number 5   Number of Visits 16   Date for PT Re-Evaluation 04/30/15   Authorization Type Medicare   Authorization Time Period 02/12/15-04/23/15; G-code done 3rd visit    Authorization - Visit Number 5   Authorization - Number of Visits 10   PT Start Time 1304   PT Stop Time 1346   PT Time Calculation (min) 42 min   Equipment Utilized During Treatment Gait belt   Activity Tolerance Patient tolerated treatment well;Patient limited by fatigue   Behavior During Therapy [facility] for tasks assessed/performed      Past Medical History  Diagnosis Date  . Multiple sclerosis 2010  . HTN (hypertension)   . Polysubstance abuse   . Anxiety and depression   . Chronic pain   . Coronary artery disease   . Hyperlipidemia   . Sleep apnea     Past Surgical History  Procedure Laterality Date  . Mediastinoscopy  05/15/2012    Procedure: MEDIASTINOSCOPY;  Surgeon: Loreli Slot, MD;  Location: [facility] OR;  Service: Thoracic;  Laterality: N/A;  . Cholecystectomy N/A 04/16/2013    Procedure: LAPAROSCOPIC CHOLECYSTECTOMY;  Surgeon: Dalia Heading, MD;  Location: [facility];  Service: General;  Laterality: N/A;  . Cardiac catheterization  2005    There were no vitals filed for this visit.  Visit Diagnosis:  Difficulty walking  Abnormality of gait  Weakness of both lower extremities  Impaired functional mobility and activity tolerance      Subjective Assessment - 04/16/15 1306    Subjective Patient reports he has been doing OK, just having burning pain in legs. Still having trouble with sleeping spells, MD thinks it may be the  Prednisone but hasn't made any changes to his care yet. Still having trouble with feet swelling too.    Pertinent History Patien thas had a 5 year history of known MS since 2010. Patient states recieving steroid shots that benefited him but has had pneumonia 3x in last 5 years exacerbating weakness. Patient contracted Sarcoidosis in August 2013 resulting in difficulty walking and using legs ever since. patient states he was refferred to PT by Dr. Gerilyn Pilgrim to assess his strength and flexibility to help his walking. Patient notes Rt leg seems to be really stiff secondary to spasticity and cramping that is especially pronounced at night. No pain today. not4es occasioanl pain every day in Rt lower leg, he thought it was diabetic pain, but patient does not have diabetes and he believes per MD that it is a neurological pain related to MS. Patient notes weakness from hips down.  and weakness in Rt hand that has just recently started in January. Notes numbness from Rt arm to /through Rt LE. no numbness in Lt Leg. "My left leg was the weaker but i have found its stronger now. "  Patient has hand weights and bands at home that he uses for strengthening.    Currently in Pain? No/denies                         [facility] Adult PT Treatment/Exercise - 04/16/15 0001  Ambulation/Gait   Ambulation/Gait Yes   Ambulation/Gait Assistance 4: Min guard   Ambulation Distance (Feet) 150 Feet   Assistive device Standard walker   Gait Comments extension spasticity R LE, reduced DF bilaterally, flexed at trunk, reduced gait speed, dragging of R foot, reduced hip/knee flexion R LE   however gait improved with ankle DF device R ankle    Knee/Hip Exercises: Standing   Heel Raises Both;1 set;10 reps   Heel Raises Limitations in parallel bars    Forward Lunges Both;1 set;10 reps   Forward Lunges Limitations 4 inch box    Functional Squat 1 set;10 reps   Functional Squat Limitations 'cues for form    Other  Standing Knee Exercises Backwards walking 3x3ft in parallel    Knee/Hip Exercises: Supine   Heel Slides --   Bridges Both;1 set;15 reps   Straight Leg Raises Both;1 set;10 reps   Other Supine Knee/Hip Exercises clams x10 each side                 PT Education - 04/16/15 1415    Education provided Yes   Education Details effect of spasticity on overall energy expenditure    Person(s) Educated Patient   Methods Explanation   Comprehension Verbalized understanding          PT Short Term Goals - 04/02/15 1416    PT SHORT TERM GOAL #1   Title Patrient will dmeonstrate increased hip abduction strength bilaterally to 2+ to be able to lift legs so patient can ambulate decreased trendelenberg gait   PT SHORT TERM GOAL #2   Title Patint will dmoenstrate increase hip flexion stength to 2+/5 so patient can lift hips 90 degrees to decrease foot drag durign gait.    Status On-going   PT SHORT TERM GOAL #3   Title Patient will demonstrate bilateral ankle dorsiflexion 2+/5 MMT to be abel to ambuate wihtout toe drag during gait.    Status On-going   PT SHORT TERM GOAL #4   Title I with HEP for progression of LE strength.    Status On-going           PT Long Term Goals - 04/02/15 1417    PT LONG TERM GOAL #1   Title Patrient will dmeonstrate increased hip abduction strength bilaterally to 3+/5 to be able to lift legs so patient can ambulate decreased trendelenberg gait   PT LONG TERM GOAL #2   Title Patint will dmoenstrate increase hip flexion stength to 3+/5 so patient can lift hips 90 degrees to decrease foot drag durign gait.   PT LONG TERM GOAL #3   Title Patint will be bale to perform sit to stand without UE support   PT LONG TERM GOAL #4   Title Patient will be able to ambulate at least 593ft with LRAD and minimal fatigue, no rest break    PT LONG TERM GOAL #5   Title Patient will demonstrate improved hip and low back mobility, also reduced tone R LE, as evidenced by  increased ease with bed mobility and transfers                Plan - 04/16/15 1423    Clinical Impression Statement Focus on functional exercises in supine and standing; during SLRs in supine patient did experience increased extensor spasticity in R LE and required PT facilitation of passive hip ER/IR to reduce spasticity and related discomfort. Able to perform functional standing exercises in parallel bars today but limited by  fatigue. Wore ankle dorsiflexion strap to improve R ankle mechancis and improve overall mobility today. Wtih strap on, pateint demonstrated vastly improved form during standing activity and gait with walker, reduced fatigue.    Pt will benefit from skilled therapeutic intervention in order to improve on the following deficits Abnormal gait;Decreased strength;Difficulty walking;Decreased mobility;Decreased balance;Improper body mechanics   Rehab Potential Fair   Clinical Impairments Affecting Rehab Potential Multiple sclerosis and Sarcoidosis.    PT Frequency 2x / week   PT Duration 8 weeks   PT Treatment/Interventions Therapeutic exercise;Balance training;Gait training;Patient/family education;Functional mobility training   PT Next Visit Plan Continue with current PT POC with split stance hip excursion, heel raises, bridges.  Progress standing exercises as able.   PT Home Exercise Plan bent knee raise/heel slide, trunk rotation, clams, 3D hip excursions, calf stretch.    Consulted and Agree with Plan of Care Patient        Problem List Patient Active Problem List   Diagnosis Date Noted  . Hyperlipidemia 08/13/2013  . Venous insufficiency 08/13/2013  . CAD (coronary artery disease) 08/13/2013  . MS (multiple sclerosis) 08/03/2013  . Spasticity 08/03/2013  . Obstructive sleep apnea 11/29/2012  . Sarcoidosis 05/19/2012  . Paraplegia 05/19/2012  . Multiple sclerosis exacerbation 05/07/2012  . Fever 05/07/2012  . HTN (hypertension) 05/07/2012  . COPD  (chronic obstructive pulmonary disease) 05/07/2012  . Chronic pain 05/07/2012  . Polysubstance abuse 05/07/2012  . Lower extremity pain 05/07/2012    Nedra Hai PT, DPT [phone]  [facility] [address] [place], [place], [address] Phone: [phone]   Fax:  [phone]

## 2015-04-24 ENCOUNTER — Encounter: Payer: Self-pay | Admitting: Cardiovascular Disease

## 2015-04-28 ENCOUNTER — Encounter: Payer: Self-pay | Admitting: Cardiovascular Disease

## 2015-04-28 ENCOUNTER — Ambulatory Visit: Payer: Medicare Other | Attending: Neurology | Admitting: Sleep Medicine

## 2015-04-28 DIAGNOSIS — G4710 Hypersomnia, unspecified: Secondary | ICD-10-CM | POA: Insufficient documentation

## 2015-04-28 DIAGNOSIS — G4733 Obstructive sleep apnea (adult) (pediatric): Secondary | ICD-10-CM

## 2015-04-28 DIAGNOSIS — R0683 Snoring: Secondary | ICD-10-CM | POA: Insufficient documentation

## 2015-04-30 ENCOUNTER — Ambulatory Visit (HOSPITAL_COMMUNITY): Payer: Medicare Other | Admitting: Physical Therapy

## 2015-04-30 DIAGNOSIS — R262 Difficulty in walking, not elsewhere classified: Secondary | ICD-10-CM

## 2015-04-30 DIAGNOSIS — Z7409 Other reduced mobility: Secondary | ICD-10-CM

## 2015-04-30 DIAGNOSIS — R269 Unspecified abnormalities of gait and mobility: Secondary | ICD-10-CM

## 2015-04-30 DIAGNOSIS — R29898 Other symptoms and signs involving the musculoskeletal system: Secondary | ICD-10-CM

## 2015-04-30 NOTE — Therapy (Signed)
[facility] [address] [place], [place], [address] Phone: [phone]   Fax:  [phone]  Physical Therapy Treatment (Discharge Assessment)  Patient Details  Name: Chad Vasquez MRN: [id number] Date of Birth: Nov 26, 1960 Referring Provider:  Phillips Odor, MD  Encounter Date: 04/30/2015      PT End of Session - 04/30/15 1534    Visit Number 6   Number of Visits 16   Authorization Type Medicare   Authorization Time Period recert done    Authorization - Visit Number 6   Authorization - Number of Visits 10   PT Start Time 6010   PT Stop Time 1346   PT Time Calculation (min) 42 min   Equipment Utilized During Treatment Gait belt   Activity Tolerance Patient tolerated treatment well;Patient limited by fatigue   Behavior During Therapy [facility] for tasks assessed/performed      Past Medical History  Diagnosis Date  . Multiple sclerosis 2010  . HTN (hypertension)   . Polysubstance abuse   . Anxiety and depression   . Chronic pain   . Coronary artery disease   . Hyperlipidemia   . Sleep apnea     Past Surgical History  Procedure Laterality Date  . Mediastinoscopy  05/15/2012    Procedure: MEDIASTINOSCOPY;  Surgeon: Melrose Nakayama, MD;  Location: [facility];  Service: Thoracic;  Laterality: N/A;  . Cholecystectomy N/A 04/16/2013    Procedure: LAPAROSCOPIC CHOLECYSTECTOMY;  Surgeon: Jamesetta So, MD;  Location: [facility];  Service: General;  Laterality: N/A;  . Cardiac catheterization  2005    There were no vitals filed for this visit.  Visit Diagnosis:  Difficulty walking - Plan: PT plan of care cert/re-cert  Abnormality of gait - Plan: PT plan of care cert/re-cert  Weakness of both lower extremities - Plan: PT plan of care cert/re-cert  Impaired functional mobility and activity tolerance - Plan: PT plan of care cert/re-cert      Subjective Assessment - 04/30/15 1309    Subjective Patient reports that he woke up saturday  and his R hand was swollen for no reason; has called MD but has yet to hear back about. Still dealing with sleep issues where he just gets tired through the day. had sleep study done recently but results hadn't come back yet.    Pertinent History Patien thas had a 5 year history of known MS since 2010. Patient states recieving steroid shots that benefited him but has had pneumonia 3x in last 5 years exacerbating weakness. Patient contracted Sarcoidosis in August 2013 resulting in difficulty walking and using legs ever since. patient states he was refferred to PT by Dr. Merlene Laughter to assess his strength and flexibility to help his walking. Patient notes Rt leg seems to be really stiff secondary to spasticity and cramping that is especially pronounced at night. No pain today. not4es occasioanl pain every day in Rt lower leg, he thought it was diabetic pain, but patient does not have diabetes and he believes per MD that it is a neurological pain related to MS. Patient notes weakness from hips down.  and weakness in Rt hand that has just recently started in January. Notes numbness from Rt arm to /through Rt LE. no numbness in Lt Leg. "My left leg was the weaker but i have found its stronger now. "  Patient has hand weights and bands at home that he uses for strengthening.    Currently in Pain? No/denies  [facility] PT Assessment - 04/30/15 0001    Assessment   Medical Diagnosis MS and unsteady gait   Onset Date/Surgical Date 02/11/10   Next MD Visit Hilma Favors on August 9th   Prior Therapy yes   AROM   Right Ankle Dorsiflexion -10   Left Ankle Dorsiflexion -8   Strength   Right Hip Flexion 2/5   Right Hip ABduction 2-/5   Left Hip Flexion 2+/5   Left Hip ABduction 2/5   Right Knee Flexion 4-/5   Left Knee Flexion 4/5   Left Knee Extension 4/5   Right Ankle Dorsiflexion 2-/5   Left Ankle Dorsiflexion 4-/5   Bed Mobility   Rolling Right 6: Modified independent (Device/Increase time)   Rolling  Left 6: Modified independent (Device/Increase time)   Sit to Supine 6: Modified independent (Device/Increase time)   Transfers   Sit to Stand 6: Modified independent (Device/Increase time)                     [facility] Adult PT Treatment/Exercise - 04/30/15 0001    Transfers   Transfers Sit to [facility];Lateral/Scoot Transfers   Sit to Stand 6: Modified independent (Device/Increase time)   Stand Pivot Transfers 6: Modified independent (Device/Increase time)   Lateral/Scoot Transfers 6: Modified independent (Device/Increase time)   Ambulation/Gait   Ambulation/Gait Yes   Ambulation/Gait Assistance 4: Min guard   Ambulation Distance (Feet) 193 Feet   Assistive device Standard walker   Gait Comments extension spasticity R LE, reduced DF bilaterally, flexed at trunk, reduced gait speed, dragging of R foot, reduced hip/knee flexion R LE   however gait improved with ankle DF device R ankle    Knee/Hip Exercises: Stretches   Passive Hamstring Stretch Both;2 reps;30 seconds   Passive Hamstring Stretch Limitations supine    Piriformis Stretch Both;2 reps;20 seconds   Piriformis Stretch Limitations supine                 PT Education - 04/30/15 1533    Education provided Yes   Education Details effect of spasticity on overall energy expenditure; ways to assist in reducing spastiticity; possible signs of MS exacerbation; importance of checking in with MD regarding swollen R hand    Person(s) Educated Patient   Methods Explanation   Comprehension Verbalized understanding          PT Short Term Goals - 04/02/15 1416    PT SHORT TERM GOAL #1   Title Patrient will dmeonstrate increased hip abduction strength bilaterally to 2+ to be able to lift legs so patient can ambulate decreased trendelenberg gait   PT SHORT TERM GOAL #2   Title Patint will dmoenstrate increase hip flexion stength to 2+/5 so patient can lift hips 90 degrees to decrease foot drag  durign gait.    Status On-going   PT SHORT TERM GOAL #3   Title Patient will demonstrate bilateral ankle dorsiflexion 2+/5 MMT to be abel to ambuate wihtout toe drag during gait.    Status On-going   PT SHORT TERM GOAL #4   Title I with HEP for progression of LE strength.    Status On-going           PT Long Term Goals - 04/02/15 1417    PT LONG TERM GOAL #1   Title Patrient will dmeonstrate increased hip abduction strength bilaterally to 3+/5 to be able to lift legs so patient can ambulate decreased trendelenberg gait   PT LONG TERM GOAL #2  Title Patint will dmoenstrate increase hip flexion stength to 3+/5 so patient can lift hips 90 degrees to decrease foot drag durign gait.   PT LONG TERM GOAL #3   Title Patint will be bale to perform sit to stand without UE support   PT LONG TERM GOAL #4   Title Patient will be able to ambulate at least 547f with LRAD and minimal fatigue, no rest break    PT LONG TERM GOAL #5   Title Patient will demonstrate improved hip and low back mobility, also reduced tone R LE, as evidenced by increased ease with bed mobility and transfers                Plan - [id number]    Clinical Impression Statement Re-assessment performed today. patient continues to demonstrate spasticity R LE greater than L, weakness in bilateral lower extremities and proximal muscles/core, postural and gait deviations, reduced functional activity tolearnce, and reduced functional task performance skills. Today the patient presented with swollen R hand; grip strength slightly weaker on R but no color changes , extreme edema, or tenderness that would cause concern for blood clot although patient was advised to call MD again regarding hand just to be safe. R side weaker in general and patient also demonstrated with new foot inversion on L during swing phase of gait. Patient also appears more fatigued at baseline during today's treatment session. All signs and physcial measures  today point to possible MS exacerbation rather than pure physical regression. Patient is significantly limited in terms of frequency and overall number of session due to financial reasoins. Patient remains very active at home and is motivated to continue with functional exercises and activities he has been doing on his own at home; patient in agreement for DC at this time due to no further need  for skilled PT services at this time as his current home program remains appropriate.    Pt will benefit from skilled therapeutic intervention in order to improve on the following deficits Abnormal gait;Decreased strength;Difficulty walking;Decreased mobility;Decreased balance;Improper body mechanics   Rehab Potential Fair   Clinical Impairments Affecting Rehab Potential Multiple sclerosis and Sarcoidosis.    PT Treatment/Interventions Therapeutic exercise;Balance training;Gait training;Patient/family education;Functional mobility training   PT Next Visit Plan DC today    PT Home Exercise Plan bent knee raise/heel slide, trunk rotation, clams, 3D hip excursions, calf stretch.    Consulted and Agree with Plan of Care Patient          G-Codes - [id number]    Functional Assessment Tool Used Skilled clinical observation of mobility, strength, gait, balance, functional activity tolerance    Functional Limitation Mobility: Walking and moving around   Mobility: Walking and Moving Around Goal Status [phone] At least 20 percent but less than 40 percent impaired, limited or restricted   Mobility: Walking and Moving Around Discharge Status [phone] At least 40 percent but less than 60 percent impaired, limited or restricted      Problem List Patient Active Problem List   Diagnosis Date Noted  . Hyperlipidemia 08/13/2013  . Venous insufficiency 08/13/2013  . CAD (coronary artery disease) 08/13/2013  . MS (multiple sclerosis) 08/03/2013  . Spasticity 08/03/2013  . Obstructive sleep apnea 11/29/2012  .  Sarcoidosis 05/19/2012  . Paraplegia 05/19/2012  . Multiple sclerosis exacerbation 05/07/2012  . Fever 05/07/2012  . HTN (hypertension) 05/07/2012  . COPD (chronic obstructive pulmonary disease) 05/07/2012  . Chronic pain 05/07/2012  . Polysubstance abuse 05/07/2012  .  Lower extremity pain 05/07/2012   PHYSICAL THERAPY DISCHARGE SUMMARY  Visits from Start of Care: 6  Current functional level related to goals / functional outcomes: Patient has been focusing on comprehensive home program on his own at home since he is very limited in frequency and overall sessions of skilled PT services due to financial reasons; reports that he has been feeling more fatigued recently and all signs during today's re-assessment points to possible exacerbation of MS (See above note for details)   Remaining deficits: spasticity, fatigue, general weakness, unsteadiness, reduced functional activity tolerance, reduced functional task performance, gait impairment     Education / Equipment: Patient advised to see MD regarding swelling in hand, importance of continuing being active techniques to assist in reducing spasticity  Plan: Patient agrees to discharge.  Patient goals were not met. Patient is being discharged due to the patient's request.  ?????        Deniece Ree PT, DPT [facility] [place], [place], [address] Phone: [phone]   Fax:  [phone]

## 2015-05-03 NOTE — Sleep Study (Signed)
  [facility] Jerrick Farve A. Gerilyn Pilgrim, MD     [email or web address]             NOCTURNAL POLYSOMNOGRAPHY   LOCATION: [facility]  Patient Name: Chad, Vasquez Date: 04/28/2015 Gender: Male D.O.B: August 16, 1961 Age (years): 50 Referring Provider: Beryle Beams MD, ABSM Height (inches): 75 Interpreting Physician: Beryle Beams MD, ABSM Weight (lbs): 261 RPSGT: Alfonso Ellis BMI: 33 MRN: [id number] Neck Size: 17.50 CLINICAL INFORMATION Sleep Study Type: NPSG     Indication for sleep study: Excessive Daytime Sleepiness, OSA     Epworth Sleepiness Score: 15     SLEEP STUDY TECHNIQUE As per the AASM Manual for the Scoring of Sleep and Associated Events v2.3 (April 2016) with a hypopnea requiring 4% desaturations.  The channels recorded and monitored were frontal, central and occipital EEG, electrooculogram (EOG), submentalis EMG (chin), nasal and oral airflow, thoracic and abdominal wall motion, anterior tibialis EMG, snore microphone, electrocardiogram, and pulse oximetry.  MEDICATIONS Patient's medications include: BACLOFEN, DIAZEPAM, TRAMADOL, teriflunomide. Medications self-administered by patient during sleep study : No sleep medicine administered.  SLEEP ARCHITECTURE The study was initiated at 11:25:05 PM and ended at 5:23:50 AM.  Sleep onset time was 20.3 minutes and the sleep efficiency was 76.2%. The total sleep time was 273.4 minutes.  Stage REM latency was 88.0 minutes.  The patient spent 9.33% of the night in stage N1 sleep, 66.72% in stage N2 sleep, 5.85% in stage N3 and 18.10% in REM.  Alpha intrusion was absent.  Supine sleep was 0.55%.  RESPIRATORY PARAMETERS The overall apnea/hypopnea index (AHI) was 3.1 per hour. There were 4 total apneas, including 3 obstructive, 0 central and 1 mixed apneas. There were 10 hypopneas and 0 RERAs.  The AHI during Stage REM sleep was 8.5 per hour.  AHI while supine was 0.0 per hour.  The mean oxygen  saturation was 92.87%. The minimum SpO2 during sleep was 86.00%.  Loud snoring was noted during this study.  CARDIAC DATA The 2 lead EKG demonstrated sinus rhythm. The mean heart rate was 61.01 beats per minute. Other EKG findings include: None. LEG MOVEMENT DATA The total PLMS were 0 with a resulting PLMS index of 0.00. Associated arousal with leg movement index was 0.0 .  IMPRESSIONS No significant obstructive sleep apnea occurred during this study. No significant central sleep apnea occurred during this study. Mild oxygen desaturation was noted during this study. The patient snored with Loud snoring volume. No cardiac abnormalities were noted during this study. Clinically significant periodic limb movements did not occur during sleep. No significant associated arousals.     Argie Ramming, MD Diplomate, American Board of Sleep Medicine.

## 2015-07-31 ENCOUNTER — Ambulatory Visit (HOSPITAL_COMMUNITY): Payer: Medicare Other | Attending: Neurology

## 2015-07-31 DIAGNOSIS — R29898 Other symptoms and signs involving the musculoskeletal system: Secondary | ICD-10-CM | POA: Diagnosis present

## 2015-07-31 DIAGNOSIS — R262 Difficulty in walking, not elsewhere classified: Secondary | ICD-10-CM | POA: Insufficient documentation

## 2015-07-31 DIAGNOSIS — G729 Myopathy, unspecified: Secondary | ICD-10-CM | POA: Insufficient documentation

## 2015-07-31 DIAGNOSIS — Z7409 Other reduced mobility: Secondary | ICD-10-CM

## 2015-07-31 DIAGNOSIS — M6289 Other specified disorders of muscle: Secondary | ICD-10-CM

## 2015-07-31 DIAGNOSIS — R269 Unspecified abnormalities of gait and mobility: Secondary | ICD-10-CM | POA: Insufficient documentation

## 2015-07-31 NOTE — Therapy (Addendum)
[facility] [address] [place], [place], [address] Phone: [phone]   Fax:  [phone]  Physical Therapy Evaluation  Patient Details  Name: Chad Vasquez MRN: [id number] Date of Birth: 1961-09-03 Referring Provider: Gerilyn Pilgrim  Encounter Date: Aug 15, 2015    Past Medical History  Diagnosis Date  . Multiple sclerosis 2010  . HTN (hypertension)   . Polysubstance abuse   . Anxiety and depression   . Chronic pain   . Coronary artery disease   . Hyperlipidemia   . Sleep apnea     Past Surgical History  Procedure Laterality Date  . Mediastinoscopy  05/15/2012    Procedure: MEDIASTINOSCOPY;  Surgeon: Loreli Slot, MD;  [facility]: [facility];  Service: Thoracic;  Laterality: N/A;  . Cholecystectomy N/A 04/16/2013    Procedure: LAPAROSCOPIC CHOLECYSTECTOMY;  Surgeon: Dalia Heading, MD;  Location: [facility];  Service: General;  Laterality: N/A;  . Cardiac catheterization  2005    There were no vitals filed for this visit.  Visit Diagnosis:  Impaired functional mobility and activity tolerance - Plan: PT plan of care cert/re-cert  Weakness of both lower extremities - Plan: PT plan of care cert/re-cert  Abnormality of gait - Plan: PT plan of care cert/re-cert  Difficulty walking - Plan: PT plan of care cert/re-cert  Muscle tightness - Plan: PT plan of care cert/re-cert                   .            PT Short Term Goals - 2015-08-15 1742    PT SHORT TERM GOAL #1   Title Pt will demonstrate independence in beginning home exercise program by twos weeks after commencement of therapy, to affirm self-efficacy in work at home to making progress toward goals.   PT SHORT TERM GOAL #2   Title After 2 weeks, pt will describe in detail 3 ways to manage exacerbation of symptoms at home to demonstrate greater self-efficacy in self-management of wellness and function.    PT SHORT TERM GOAL #3   Title Pt will improve  functional  mobility by the 4th week evident by TUG time <44 seconds to demonstrate improved indep in functional mobility and to decrease risk of falls in the home.            PT Long Term Goals - 2015-08-15 1744    PT LONG TERM GOAL #1   Title Pt will demonstrate independence in advanced home exercise program by 1 week prior to discharge, to further self-efficacy in continuation of progress toward goals after discharge from therapy.    PT LONG TERM GOAL #2   Title Pt will ambulate 464ft c RW and s exacerbation of symptoms after 8 weeks, to demonstrate improved activity tolerance and improved independence in limited community ambulation.    PT LONG TERM GOAL #3   Title After 8 weeks, pt will improve functional mobility as evidence by TUG time of <35 seconds to demonstrate improved indep in functional mobility and to decrease risk of falls in the home.                      G-Codes - 08/15/2015 1229    Functional Assessment Tool Used Skilled clinical observation of mobility, strength, gait, balance, functional activity tolerance    Functional Limitation Mobility: Walking and moving around   Mobility: Walking and Moving Around Current Status ([id number]) At least 60 percent but less  than 80 percent impaired, limited or restricted   Mobility: Walking and Moving Around Goal Status [phone]) At least 40 percent but less than 60 percent impaired, limited or restricted       Problem List Patient Active Problem List   Diagnosis Date Noted  . Hyperlipidemia 08/13/2013  . Venous insufficiency 08/13/2013  . CAD (coronary artery disease) 08/13/2013  . MS (multiple sclerosis) (HCC) 08/03/2013  . Spasticity 08/03/2013  . Obstructive sleep apnea 11/29/2012  . Sarcoidosis (HCC) 05/19/2012  . Paraplegia (HCC) 05/19/2012  . Multiple sclerosis exacerbation (HCC) 05/07/2012  . Fever 05/07/2012  . HTN (hypertension) 05/07/2012  . COPD (chronic obstructive pulmonary disease) (HCC) 05/07/2012  .  Chronic pain 05/07/2012  . Polysubstance abuse 05/07/2012  . Lower extremity pain 05/07/2012    Drayton Tieu C 07/31/2015, 12:31 PM  12:31 PM  Rosamaria Lints, PT, DPT [facility] License # [id number]  12:34 PM  Rosamaria Lints, PT, DPT [place] License # [id number]      *updated Gcodes from original note      [facility] [address] [place], [place], [address] Phone: [phone]   Fax:  [phone]  Name: Chad Vasquez MRN: [id number] Date of Birth: 11-19-1960

## 2015-08-07 ENCOUNTER — Ambulatory Visit (HOSPITAL_COMMUNITY): Payer: Medicare Other | Attending: Neurology

## 2015-08-07 DIAGNOSIS — Z7409 Other reduced mobility: Secondary | ICD-10-CM

## 2015-08-07 DIAGNOSIS — R269 Unspecified abnormalities of gait and mobility: Secondary | ICD-10-CM | POA: Diagnosis present

## 2015-08-07 DIAGNOSIS — R262 Difficulty in walking, not elsewhere classified: Secondary | ICD-10-CM

## 2015-08-07 DIAGNOSIS — G729 Myopathy, unspecified: Secondary | ICD-10-CM | POA: Insufficient documentation

## 2015-08-07 DIAGNOSIS — M6289 Other specified disorders of muscle: Secondary | ICD-10-CM

## 2015-08-07 DIAGNOSIS — R29898 Other symptoms and signs involving the musculoskeletal system: Secondary | ICD-10-CM | POA: Insufficient documentation

## 2015-08-07 NOTE — Therapy (Signed)
[facility] [address] [place], [place], [address] Phone: [phone]   Fax:  [phone]  Physical Therapy Treatment  Patient Details  Name: Chad Vasquez MRN: [id number] Date of Birth: 31-Jan-1961 Referring Provider: Gerilyn Pilgrim  Encounter Date: 08/07/2015      PT End of Session - 08/07/15 1541    Visit Number 2   Date for PT Re-Evaluation 09/30/15   Authorization Type Medicare   Authorization - Visit Number 2   PT Start Time 1350   PT Stop Time 1430   PT Time Calculation (min) 40 min   Activity Tolerance Patient tolerated treatment well;No increased pain   Behavior During Therapy [facility] for tasks assessed/performed      Past Medical History  Diagnosis Date  . Multiple sclerosis 2010  . HTN (hypertension)   . Polysubstance abuse   . Anxiety and depression   . Chronic pain   . Coronary artery disease   . Hyperlipidemia   . Sleep apnea     Past Surgical History  Procedure Laterality Date  . Mediastinoscopy  05/15/2012    Procedure: MEDIASTINOSCOPY;  Surgeon: Loreli Slot, MD;  Location: [facility] OR;  Service: Thoracic;  Laterality: N/A;  . Cholecystectomy N/A 04/16/2013    Procedure: LAPAROSCOPIC CHOLECYSTECTOMY;  Surgeon: Dalia Heading, MD;  Location: [facility];  Service: General;  Laterality: N/A;  . Cardiac catheterization  2005    There were no vitals filed for this visit.  Visit Diagnosis:  Impaired functional mobility and activity tolerance  Weakness of both lower extremities  Abnormality of gait  Difficulty walking  Muscle tightness      Subjective Assessment - 08/07/15 1559    Subjective Pt arrives in W/C today reportign that he had another instance of knees locking up into extension with a fall at home. Pt had to crawl to the house adn eventually call EMS unable to get up with assistance from partner. Pt reports he sustained no injuries, but  is very sore in bilat knees and his R hip. Pt reports he likley  will not do well today with transfers or ambulation.    Pertinent History Patien thas had a 5 year history of known MS since 2010. Patient states recieving steroid shots that benefited him but has had pneumonia 3x in last 5 years exacerbating weakness. Patient contracted Sarcoidosis in August 2013 resulting in difficulty walking and using legs ever since. patient states he was refferred to PT by Dr. Gerilyn Pilgrim to assess his strength and flexibility to help his walking. Patient notes Rt leg seems to be really stiff secondary to spasticity and cramping that is especially pronounced at night. No pain today. not4es occasioanl pain every day in Rt lower leg, he thought it was diabetic pain, but patient does not have diabetes and he believes per MD that it is a neurological pain related to MS. Patient notes weakness from hips down.  and weakness in Rt hand that has just recently started in January. Notes numbness from Rt arm to /through Rt LE. no numbness in Lt Leg. "My left leg was the weaker but i have found its stronger now. "  Patient has hand weights and bands at home that he uses for strengthening.    Limitations Standing;Walking   How long can you sit comfortably? None   How long can you stand comfortably? 10 minutes befor eonset of knee buckling   How long can you walk comfortably? 153ft (estimate 5-6 minutes)  Patient Stated Goals Would like to improve walking tolerance and distance.    Currently in Pain? No/denies                         [facility] Adult PT Treatment/Exercise - 08/07/15 0001    Knee/Hip Exercises: Stretches   Passive Hamstring Stretch Both;3 reps;30 seconds  seated, BUE on knee.    Piriformis Stretch Both;Other (comment)  reviewed with patient, performing well.    Gastroc Stretch Right;30 seconds;2 reps;Left  soleus, semirecumbent   Knee/Hip Exercises: Supine   Other Supine Knee/Hip Exercises LAQ 2x10 semirecumbent MaxA    Other Supine Knee/Hip Exercises Marching  SKTC MaxA  2x10 MaxA, VC to avoid breath holding                PT Education - 08/07/15 1540    Education provided Yes   Education Details Descibed in detail how to perform seated Hamstrings stretching independently. Further discussed AFO considerations. Reviewed evaluation and goals.    Person(s) Educated Patient   Methods Explanation   Comprehension Verbalized understanding          PT Short Term Goals - 07/31/15 1742    PT SHORT TERM GOAL #1   Title Pt will demonstrate independence in beginning home exercise program by twos weeks after commencement of therapy, to affirm self-efficacy in work at home to making progress toward goals.   PT SHORT TERM GOAL #2   Title After 2 weeks, pt will describe in detail 3 ways to manage exacerbation of symptoms at home to demonstrate greater self-efficacy in self-management of wellness and function.    PT SHORT TERM GOAL #3   Title Pt will improve functional  mobility by the 4th week evident by TUG time <44 seconds to demonstrate improved indep in functional mobility and to decrease risk of falls in the home.            PT Long Term Goals - 07/31/15 1744    PT LONG TERM GOAL #1   Title Pt will demonstrate independence in advanced home exercise program by 1 week prior to discharge, to further self-efficacy in continuation of progress toward goals after discharge from therapy.    PT LONG TERM GOAL #2   Title Pt will ambulate 464ft c RW and s exacerbation of symptoms after 8 weeks, to demonstrate improved activity tolerance and improved independence in limited community ambulation.    PT LONG TERM GOAL #3   Title After 8 weeks, pt will improve functional mobility as evidence by TUG time of <35 seconds to demonstrate improved indep in functional mobility and to decrease risk of falls in the home.                Plan - 08/07/15 1542    Clinical Impression Statement Pt tolerating treatment session well, no further pain or  spasticity. Pt is making progress toward goals as evidenced by addition of new HEP activity that patient s abel to perform indep.  Pt continues to be limited by acute onset of muscle spasms that are causing knees to lock up, leading to falls. Will continue with plan of care as previously laid out as appropriate.    Pt will benefit from skilled therapeutic intervention in order to improve on the following deficits Abnormal gait;Decreased strength;Difficulty walking;Decreased mobility;Decreased balance;Improper body mechanics;Decreased activity tolerance;Decreased endurance;Decreased coordination;Decreased range of motion;Impaired flexibility   Rehab Potential Fair   Clinical Impairments Affecting Rehab Potential Multiple sclerosis  and Sarcoidosis.    PT Frequency 1x / week   PT Duration 8 weeks   PT Treatment/Interventions Therapeutic exercise;Balance training;Gait training;Patient/family education;Functional mobility training;Therapeutic activities;DME Instruction   PT Next Visit Plan Review HS stretches for home, teach  self-assisted LAQ with a strap, begin quadruped crawling for gravity assisted hip strengthening bilat and to promote indep in falls scenarios at home.    PT Home Exercise Plan Seated hamstrings stretching.    Consulted and Agree with Plan of Care Patient        Problem List Patient Active Problem List   Diagnosis Date Noted  . Hyperlipidemia 08/13/2013  . Venous insufficiency 08/13/2013  . CAD (coronary artery disease) 08/13/2013  . MS (multiple sclerosis) (HCC) 08/03/2013  . Spasticity 08/03/2013  . Obstructive sleep apnea 11/29/2012  . Sarcoidosis (HCC) 05/19/2012  . Paraplegia (HCC) 05/19/2012  . Multiple sclerosis exacerbation (HCC) 05/07/2012  . Fever 05/07/2012  . HTN (hypertension) 05/07/2012  . COPD (chronic obstructive pulmonary disease) (HCC) 05/07/2012  . Chronic pain 05/07/2012  . Polysubstance abuse 05/07/2012  . Lower extremity pain 05/07/2012     Madylin Fairbank C 08/07/2015, 4:02 PM 4:02 PM  Rosamaria Lints, PT, DPT [place] License # [id number]      [facility] [address] [place], [place], [address] Phone: [phone]   Fax:  [phone]  Name: Chad Vasquez MRN: [id number] Date of Birth: 04-22-1961

## 2015-08-07 NOTE — Therapy (Deleted)
[facility] [place] [facility] [address] [place], [place], [address] Phone: [phone]   Fax:  [phone]  Physical Therapy Treatment  Patient Details  Name: Chad Vasquez MRN: [id number] Date of Birth: 1961/10/03 Referring Provider: Gerilyn Pilgrim  Encounter Date: 08/07/2015      PT End of Session - 08/07/15 1541    Visit Number 2   Date for PT Re-Evaluation 09/30/15   Authorization Type Medicare   Authorization - Visit Number 2   PT Start Time 1350   PT Stop Time 1430   PT Time Calculation (min) 40 min   Activity Tolerance Patient tolerated treatment well;No increased pain   Behavior During Therapy [facility] for tasks assessed/performed      Past Medical History  Diagnosis Date  . Multiple sclerosis 2010  . HTN (hypertension)   . Polysubstance abuse   . Anxiety and depression   . Chronic pain   . Coronary artery disease   . Hyperlipidemia   . Sleep apnea     Past Surgical History  Procedure Laterality Date  . Mediastinoscopy  05/15/2012    Procedure: MEDIASTINOSCOPY;  Surgeon: Loreli Slot, MD;  Location: [facility] OR;  Service: Thoracic;  Laterality: N/A;  . Cholecystectomy N/A 04/16/2013    Procedure: LAPAROSCOPIC CHOLECYSTECTOMY;  Surgeon: Dalia Heading, MD;  Location: [facility];  Service: General;  Laterality: N/A;  . Cardiac catheterization  2005    There were no vitals filed for this visit.  Visit Diagnosis:  Impaired functional mobility and activity tolerance  Weakness of both lower extremities  Abnormality of gait  Difficulty walking  Muscle tightness                       [facility] Adult PT Treatment/Exercise - 08/07/15 0001    Knee/Hip Exercises: Stretches   Passive Hamstring Stretch Both;3 reps;30 seconds  seated, BUE on knee.    Piriformis Stretch Both;Other (comment)  reviewed with patient, performing well.    Gastroc Stretch Right;30 seconds;2 reps;Left  soleus, semirecumbent   Knee/Hip Exercises:  Supine   Other Supine Knee/Hip Exercises LAQ 2x10 semirecumbent MaxA    Other Supine Knee/Hip Exercises Marching SKTC MaxA  2x10 MaxA, VC to avoid breath holding                PT Education - 08/07/15 1540    Education provided Yes   Education Details Descibed in detail how to perform seated Hamstrings stretching independently. Further discussed AFO considerations. Reviewed evaluation and goals.    Person(s) Educated Patient   Methods Explanation   Comprehension Verbalized understanding          PT Short Term Goals - 07/31/15 1742    PT SHORT TERM GOAL #1   Title Pt will demonstrate independence in beginning home exercise program by twos weeks after commencement of therapy, to affirm self-efficacy in work at home to making progress toward goals.   PT SHORT TERM GOAL #2   Title After 2 weeks, pt will describe in detail 3 ways to manage exacerbation of symptoms at home to demonstrate greater self-efficacy in self-management of wellness and function.    PT SHORT TERM GOAL #3   Title Pt will improve functional  mobility by the 4th week evident by TUG time <44 seconds to demonstrate improved indep in functional mobility and to decrease risk of falls in the home.            PT Long Term Goals -  07/31/15 1744    PT LONG TERM GOAL #1   Title Pt will demonstrate independence in advanced home exercise program by 1 week prior to discharge, to further self-efficacy in continuation of progress toward goals after discharge from therapy.    PT LONG TERM GOAL #2   Title Pt will ambulate 465ft c RW and s exacerbation of symptoms after 8 weeks, to demonstrate improved activity tolerance and improved independence in limited community ambulation.    PT LONG TERM GOAL #3   Title After 8 weeks, pt will improve functional mobility as evidence by TUG time of <35 seconds to demonstrate improved indep in functional mobility and to decrease risk of falls in the home.                Plan  - 08/07/15 1542    Clinical Impression Statement Pt tolerating treatment session well, no further pain or spasticity. Pt is making progress toward goals as evidenced by addition of new HEP activity that patient s abel to perform indep.  Pt continues to be limited by acute onset of muscle spasms that are causing knees to lock up, leading to falls. Will continue with plan of care as previously laid out as appropriate.    Pt will benefit from skilled therapeutic intervention in order to improve on the following deficits Abnormal gait;Decreased strength;Difficulty walking;Decreased mobility;Decreased balance;Improper body mechanics;Decreased activity tolerance;Decreased endurance;Decreased coordination;Decreased range of motion;Impaired flexibility   Rehab Potential Fair   Clinical Impairments Affecting Rehab Potential Multiple sclerosis and Sarcoidosis.    PT Frequency 1x / week   PT Duration 8 weeks   PT Treatment/Interventions Therapeutic exercise;Balance training;Gait training;Patient/family education;Functional mobility training;Therapeutic activities;DME Instruction   PT Next Visit Plan Review HS stretches for home, teach  self-assisted LAQ with a strap, begin quadruped crawling for gravity assisted hip strengthening bilat and to promote indep in falls scenarios at home.    PT Home Exercise Plan Seated hamstrings stretching.    Consulted and Agree with Plan of Care Patient        Problem List Patient Active Problem List   Diagnosis Date Noted  . Hyperlipidemia 08/13/2013  . Venous insufficiency 08/13/2013  . CAD (coronary artery disease) 08/13/2013  . MS (multiple sclerosis) (HCC) 08/03/2013  . Spasticity 08/03/2013  . Obstructive sleep apnea 11/29/2012  . Sarcoidosis (HCC) 05/19/2012  . Paraplegia (HCC) 05/19/2012  . Multiple sclerosis exacerbation (HCC) 05/07/2012  . Fever 05/07/2012  . HTN (hypertension) 05/07/2012  . COPD (chronic obstructive pulmonary disease) (HCC) 05/07/2012   . Chronic pain 05/07/2012  . Polysubstance abuse 05/07/2012  . Lower extremity pain 05/07/2012    Cadell Gabrielson C 08/07/2015, 3:51 PM  3:52 PM  Rosamaria Lints, PT, DPT [place] License # [id number]       [facility] [address] [place], [place], [address] Phone: [phone]   Fax:  [phone]  Name: Chad Vasquez MRN: [id number] Date of Birth: 13-Aug-1961

## 2015-08-14 ENCOUNTER — Ambulatory Visit (HOSPITAL_COMMUNITY): Payer: Medicare Other

## 2015-08-14 DIAGNOSIS — Z7409 Other reduced mobility: Secondary | ICD-10-CM

## 2015-08-14 DIAGNOSIS — R269 Unspecified abnormalities of gait and mobility: Secondary | ICD-10-CM

## 2015-08-14 DIAGNOSIS — R262 Difficulty in walking, not elsewhere classified: Secondary | ICD-10-CM

## 2015-08-14 DIAGNOSIS — M6289 Other specified disorders of muscle: Secondary | ICD-10-CM

## 2015-08-14 DIAGNOSIS — R29898 Other symptoms and signs involving the musculoskeletal system: Secondary | ICD-10-CM

## 2015-08-14 NOTE — Therapy (Signed)
[facility] [address] [place], [place], [address] Phone: [phone]   Fax:  [phone]  Physical Therapy Treatment  Patient Details  Name: Chad Vasquez MRN: [id number] Date of Birth: 1961-08-14 Referring Provider: Gerilyn Pilgrim  Encounter Date: 08/14/2015      PT End of Session - 08/14/15 1339    Visit Number 3   Number of Visits 8   Date for PT Re-Evaluation 09/30/15   Authorization Type Medicare   Authorization - Visit Number 3   Authorization - Number of Visits 8   PT Start Time 1302   PT Stop Time 1342   PT Time Calculation (min) 40 min   Activity Tolerance Patient tolerated treatment well;No increased pain   Behavior During Therapy [facility] for tasks assessed/performed      Past Medical History  Diagnosis Date  . Multiple sclerosis 2010  . HTN (hypertension)   . Polysubstance abuse   . Anxiety and depression   . Chronic pain   . Coronary artery disease   . Hyperlipidemia   . Sleep apnea     Past Surgical History  Procedure Laterality Date  . Mediastinoscopy  05/15/2012    Procedure: MEDIASTINOSCOPY;  Surgeon: Loreli Slot, MD;  Location: [facility] OR;  Service: Thoracic;  Laterality: N/A;  . Cholecystectomy N/A 04/16/2013    Procedure: LAPAROSCOPIC CHOLECYSTECTOMY;  Surgeon: Dalia Heading, MD;  Location: [facility];  Service: General;  Laterality: N/A;  . Cardiac catheterization  2005    There were no vitals filed for this visit.  Visit Diagnosis:  Impaired functional mobility and activity tolerance  Weakness of both lower extremities  Abnormality of gait  Difficulty walking  Muscle tightness      Subjective Assessment - 08/14/15 1307    Subjective Pt says he is making limited progress at home with return of easy mobility, but it has been slow. New HEP stretches are going well. R knee conintues to feel a litle sre from fall 2 weeks ago. Denies any additional locking up of legs since.   Pertinent History  Patien thas had a 5 year history of known MS since 2010. Patient states recieving steroid shots that benefited him but has had pneumonia 3x in last 5 years exacerbating weakness. Patient contracted Sarcoidosis in August 2013 resulting in difficulty walking and using legs ever since. patient states he was refferred to PT by Dr. Gerilyn Pilgrim to assess his strength and flexibility to help his walking. Patient notes Rt leg seems to be really stiff secondary to spasticity and cramping that is especially pronounced at night. No pain today. not4es occasioanl pain every day in Rt lower leg, he thought it was diabetic pain, but patient does not have diabetes and he believes per MD that it is a neurological pain related to MS. Patient notes weakness from hips down.  and weakness in Rt hand that has just recently started in January. Notes numbness from Rt arm to /through Rt LE. no numbness in Lt Leg. "My left leg was the weaker but i have found its stronger now. "  Patient has hand weights and bands at home that he uses for strengthening.    Patient Stated Goals Would like to improve walking tolerance and distance.                          [facility] Adult PT Treatment/Exercise - 08/14/15 0001    Ambulation/Gait   Pre-Gait Activities Crawling forward/backward  10 minutes, manually assisted BLE.    Posture/Postural Control   Posture/Postural Control Postural limitations  kneeling trunk rotation 1x10 bilat   Posture Comments Mod-Max A to avoid falling andfor Rot overpressure.    Knee/Hip Exercises: Stretches   Passive Hamstring Stretch Both;3 reps;30 seconds  seated, BUE on knee.    Passive Hamstring Stretch Limitations reviewed as given for HEP   Gastroc Stretch 2 reps;30 seconds;Both  seated with ball of foot on 2" riser   Knee/Hip Exercises: Seated   Other Seated Knee/Hip Exercises LAQ 1x12; Ankle DF 1x12; Hip Flex 1x12  SEated Mod-Max Assist for all   Knee/Hip Exercises: Supine   Other Supine  Knee/Hip Exercises LAQ 2x10 semirecumbent MaxA    Other Supine Knee/Hip Exercises Marching SKTC MaxA  2x10 MaxA, VC to avoid breath holding   Knee/Hip Exercises: Sidelying   Clams Sidelyin Upper trunk rotation stretch   1x15 bilat                PT Education - 08/14/15 1337    Education provided Yes   Education Details reviewed HEP, and fixed joint alignment during stretches.    Person(s) Educated Patient   Methods Explanation   Comprehension Verbalized understanding          PT Short Term Goals - 07/31/15 1742    PT SHORT TERM GOAL #1   Title Pt will demonstrate independence in beginning home exercise program by twos weeks after commencement of therapy, to affirm self-efficacy in work at home to making progress toward goals.   PT SHORT TERM GOAL #2   Title After 2 weeks, pt will describe in detail 3 ways to manage exacerbation of symptoms at home to demonstrate greater self-efficacy in self-management of wellness and function.    PT SHORT TERM GOAL #3   Title Pt will improve functional  mobility by the 4th week evident by TUG time <44 seconds to demonstrate improved indep in functional mobility and to decrease risk of falls in the home.            PT Long Term Goals - 07/31/15 1744    PT LONG TERM GOAL #1   Title Pt will demonstrate independence in advanced home exercise program by 1 week prior to discharge, to further self-efficacy in continuation of progress toward goals after discharge from therapy.    PT LONG TERM GOAL #2   Title Pt will ambulate 422ft c RW and s exacerbation of symptoms after 8 weeks, to demonstrate improved activity tolerance and improved independence in limited community ambulation.    PT LONG TERM GOAL #3   Title After 8 weeks, pt will improve functional mobility as evidence by TUG time of <35 seconds to demonstrate improved indep in functional mobility and to decrease risk of falls in the home.                Plan - 08/14/15 1340     Clinical Impression Statement Pt making progress in isolated strengthening Pt enjoyed trying quadruped and kneeling activity, but was very tired thereafter, breaking a sweat. Continue with core and BLE stability.    Pt will benefit from skilled therapeutic intervention in order to improve on the following deficits Abnormal gait;Decreased strength;Difficulty walking;Decreased mobility;Decreased balance;Improper body mechanics;Decreased activity tolerance;Decreased endurance;Decreased coordination;Decreased range of motion;Impaired flexibility   Rehab Potential Fair   Clinical Impairments Affecting Rehab Potential Multiple sclerosis and Sarcoidosis.    PT Frequency 1x / week   PT Duration 8 weeks  PT Treatment/Interventions Therapeutic exercise;Balance training;Gait training;Patient/family education;Functional mobility training;Therapeutic activities;DME Instruction   PT Next Visit Plan Review HS stretches for home, teach  self-assisted LAQ with a strap, begin quadruped crawling for gravity assisted hip strengthening bilat and to promote indep in falls scenarios at home.    PT Home Exercise Plan seated soleus stretch; upper trunk rotation.    Consulted and Agree with Plan of Care Patient        Problem List Patient Active Problem List   Diagnosis Date Noted  . Hyperlipidemia 08/13/2013  . Venous insufficiency 08/13/2013  . CAD (coronary artery disease) 08/13/2013  . MS (multiple sclerosis) (HCC) 08/03/2013  . Spasticity 08/03/2013  . Obstructive sleep apnea 11/29/2012  . Sarcoidosis (HCC) 05/19/2012  . Paraplegia (HCC) 05/19/2012  . Multiple sclerosis exacerbation (HCC) 05/07/2012  . Fever 05/07/2012  . HTN (hypertension) 05/07/2012  . COPD (chronic obstructive pulmonary disease) (HCC) 05/07/2012  . Chronic pain 05/07/2012  . Polysubstance abuse 05/07/2012  . Lower extremity pain 05/07/2012    Buccola,Allan C 08/14/2015, 1:44 PM  1:44 PM  Rosamaria Lints, PT, DPT [place] License #  [id number]       [facility] [address] [place], [place], [address] Phone: [phone]   Fax:  [phone]  Name: Chad Vasquez MRN: [id number] Date of Birth: May 28, 1961

## 2015-08-21 ENCOUNTER — Encounter (HOSPITAL_COMMUNITY): Payer: Medicare Other

## 2015-08-22 ENCOUNTER — Ambulatory Visit (HOSPITAL_COMMUNITY): Payer: Medicare Other | Admitting: Physical Therapy

## 2015-08-22 DIAGNOSIS — M6289 Other specified disorders of muscle: Secondary | ICD-10-CM

## 2015-08-22 DIAGNOSIS — R29898 Other symptoms and signs involving the musculoskeletal system: Secondary | ICD-10-CM

## 2015-08-22 DIAGNOSIS — R262 Difficulty in walking, not elsewhere classified: Secondary | ICD-10-CM

## 2015-08-22 DIAGNOSIS — R269 Unspecified abnormalities of gait and mobility: Secondary | ICD-10-CM

## 2015-08-22 DIAGNOSIS — Z7409 Other reduced mobility: Secondary | ICD-10-CM | POA: Diagnosis not present

## 2015-08-22 NOTE — Therapy (Signed)
[facility] [address] [place], [place], [address] Phone: [phone]   Fax:  [phone]  Physical Therapy Treatment  Patient Details  Name: Chad Vasquez MRN: [id number] Date of Birth: 08-30-1961 Referring Provider: Gerilyn Pilgrim  Encounter Date: 08/22/2015      PT End of Session - 08/22/15 1413    Visit Number 4   Number of Visits 8   Date for PT Re-Evaluation 09/30/15   Authorization Type Medicare   Authorization - Visit Number 4   Authorization - Number of Visits 8   PT Start Time 1350   PT Stop Time 1430   PT Time Calculation (min) 40 min      Past Medical History  Diagnosis Date  . Multiple sclerosis 2010  . HTN (hypertension)   . Polysubstance abuse   . Anxiety and depression   . Chronic pain   . Coronary artery disease   . Hyperlipidemia   . Sleep apnea     Past Surgical History  Procedure Laterality Date  . Mediastinoscopy  05/15/2012    Procedure: MEDIASTINOSCOPY;  Surgeon: Loreli Slot, MD;  Location: [facility] OR;  Service: Thoracic;  Laterality: N/A;  . Cholecystectomy N/A 04/16/2013    Procedure: LAPAROSCOPIC CHOLECYSTECTOMY;  Surgeon: Dalia Heading, MD;  Location: [facility] ORS;  Service: General;  Laterality: N/A;  . Cardiac catheterization  2005    There were no vitals filed for this visit.  Visit Diagnosis:  Impaired functional mobility and activity tolerance  Weakness of both lower extremities  Abnormality of gait  Difficulty walking  Muscle tightness      Subjective Assessment - 08/22/15 1402    Subjective Pt states he is not abel to do much with his Rt side.   Pt states he feels like he has an infection going on because whenever he gets an infection going on he gets weaker.    Currently in Pain? No/denies                [facility] Adult PT Treatment/Exercise - 08/22/15 0001    Ambulation/Gait   Pre-Gait Activities crawling no assist needed; mad cat old horse for core.     Exercises   Exercises Knee/Hip   Knee/Hip Exercises: Stretches   Other Knee/Hip Stretches trunk rotation x 10    Knee/Hip Exercises: Supine   Quad Sets Both;10 reps   Quad Sets Limitations ankle pumps x 10    Hip Adduction Isometric 10 reps   Bridges 5 reps   Bridges Limitations slow    Other Supine Knee/Hip Exercises Pilates 50 x 2 ; clams B x 10    Other Supine Knee/Hip Exercises Marching SKTC MaxA  2x10 MaxA, VC to avoid breath holding   Knee/Hip Exercises: Sidelying   Hip ABduction Both;5 reps   Hip ABduction Limitations AA    Knee/Hip Exercises: Prone   Hamstring Curl 5 reps   Hamstring Curl Limitations Rt AA/ Lt Active    Other Prone Exercises glut sets/ heel squeezes x 10                   PT Short Term Goals - 07/31/15 1742    PT SHORT TERM GOAL #1   Title Pt will demonstrate independence in beginning home exercise program by twos weeks after commencement of therapy, to affirm self-efficacy in work at home to making progress toward goals.   PT SHORT TERM GOAL #2   Title After 2 weeks, pt will describe in  detail 3 ways to manage exacerbation of symptoms at home to demonstrate greater self-efficacy in self-management of wellness and function.    PT SHORT TERM GOAL #3   Title Pt will improve functional  mobility by the 4th week evident by TUG time <44 seconds to demonstrate improved indep in functional mobility and to decrease risk of falls in the home.            PT Long Term Goals - 07/31/15 1744    PT LONG TERM GOAL #1   Title Pt will demonstrate independence in advanced home exercise program by 1 week prior to discharge, to further self-efficacy in continuation of progress toward goals after discharge from therapy.    PT LONG TERM GOAL #2   Title Pt will ambulate 490ft c RW and s exacerbation of symptoms after 8 weeks, to demonstrate improved activity tolerance and improved independence in limited community ambulation.    PT LONG TERM GOAL #3   Title After 8 weeks, pt  will improve functional mobility as evidence by TUG time of <35 seconds to demonstrate improved indep in functional mobility and to decrease risk of falls in the home.                Plan - 08/22/15 1414    Clinical Impression Statement Pt demonstrates improved I with activity.  Rt LE only has 1 to  2 mm strength throughout.  Added bridges, Pilates 100 and prone heel squeeze.  Pt needed Max to moderate . assist with Rt LE exercises    PT Next Visit Plan continue wtih quadriped and core strengthening.         Problem List Patient Active Problem List   Diagnosis Date Noted  . Hyperlipidemia 08/13/2013  . Venous insufficiency 08/13/2013  . CAD (coronary artery disease) 08/13/2013  . MS (multiple sclerosis) (HCC) 08/03/2013  . Spasticity 08/03/2013  . Obstructive sleep apnea 11/29/2012  . Sarcoidosis (HCC) 05/19/2012  . Paraplegia (HCC) 05/19/2012  . Multiple sclerosis exacerbation (HCC) 05/07/2012  . Fever 05/07/2012  . HTN (hypertension) 05/07/2012  . COPD (chronic obstructive pulmonary disease) (HCC) 05/07/2012  . Chronic pain 05/07/2012  . Polysubstance abuse 05/07/2012  . Lower extremity pain 05/07/2012     Virgina Organ, PT CLT [phone] 08/22/2015, 2:30 PM  [facility] [address] [place], [place], [address] Phone: [phone]   Fax:  [phone]  Name: JAKOBY VANDYKEN MRN: [id number] Date of Birth: 09-27-1961

## 2015-08-27 ENCOUNTER — Telehealth (HOSPITAL_COMMUNITY): Payer: Self-pay

## 2015-08-27 ENCOUNTER — Ambulatory Visit (HOSPITAL_COMMUNITY): Payer: Medicare Other

## 2015-08-27 NOTE — Telephone Encounter (Signed)
Not feeling well

## 2015-09-04 ENCOUNTER — Ambulatory Visit (HOSPITAL_COMMUNITY): Payer: Medicare Other | Attending: Neurology

## 2015-09-04 VITALS — BP 145/85 | HR 85

## 2015-09-04 DIAGNOSIS — G729 Myopathy, unspecified: Secondary | ICD-10-CM | POA: Insufficient documentation

## 2015-09-04 DIAGNOSIS — R262 Difficulty in walking, not elsewhere classified: Secondary | ICD-10-CM

## 2015-09-04 DIAGNOSIS — Z7409 Other reduced mobility: Secondary | ICD-10-CM

## 2015-09-04 DIAGNOSIS — R29898 Other symptoms and signs involving the musculoskeletal system: Secondary | ICD-10-CM

## 2015-09-04 DIAGNOSIS — R269 Unspecified abnormalities of gait and mobility: Secondary | ICD-10-CM | POA: Diagnosis present

## 2015-09-04 DIAGNOSIS — M6289 Other specified disorders of muscle: Secondary | ICD-10-CM

## 2015-09-04 NOTE — Therapy (Signed)
[facility] [address] [place], [place], [address] Phone: [phone]   Fax:  [phone]  Physical Therapy Treatment  Patient Details  Name: Chad Vasquez MRN: [id number] Date of Birth: 08-07-1961 Referring Provider: Gerilyn Vasquez  Encounter Date: 09/04/2015      PT End of Session - 09/04/15 2205    Visit Number 5   Number of Visits 8   Date for PT Re-Evaluation 09/30/15   Authorization Type Medicare   Authorization - Visit Number 5   Authorization - Number of Visits 8   PT Start Time 1305   PT Stop Time 1350   PT Time Calculation (min) 45 min   Equipment Utilized During Treatment Gait belt   Activity Tolerance Patient tolerated treatment well;Patient limited by fatigue;No increased pain      Past Medical History  Diagnosis Date  . Multiple sclerosis 2010  . HTN (hypertension)   . Polysubstance abuse   . Anxiety and depression   . Chronic pain   . Coronary artery disease   . Hyperlipidemia   . Sleep apnea     Past Surgical History  Procedure Laterality Date  . Mediastinoscopy  05/15/2012    Procedure: MEDIASTINOSCOPY;  Surgeon: Chad Slot, MD;  Location: [facility] OR;  Service: Thoracic;  Laterality: N/A;  . Cholecystectomy N/A 04/16/2013    Procedure: LAPAROSCOPIC CHOLECYSTECTOMY;  Surgeon: Chad Heading, MD;  Location: [facility] ORS;  Service: General;  Laterality: N/A;  . Cardiac catheterization  2005    Filed Vitals:   09/04/15 1316  BP: 145/85  Pulse: 85    Visit Diagnosis:  Impaired functional mobility and activity tolerance  Weakness of both lower extremities  Abnormality of gait  Difficulty walking  Muscle tightness      Subjective Assessment - 09/04/15 2157    Subjective Pt reports he continues to feel continued fatigue after episode of infection last week. He has been taking it easy, resting, but continues to perform stretches.    Pertinent History Patient has had a 5 year history of known MS since  2010. Patient states recieving steroid shots that benefited him but has had pneumonia 3x in last 5 years exacerbating weakness. Patient contracted Sarcoidosis in August 2013 resulting in difficulty walking and using legs ever since. patient states he was refferred to PT by Dr. Gerilyn Vasquez to assess his strength and flexibility to help his walking. Patient notes Rt leg seems to be really stiff secondary to spasticity and cramping that is especially pronounced at night. No pain today. not4es occasioanl pain every day in Rt lower leg, he thought it was diabetic pain, but patient does not have diabetes and he believes per MD that it is a neurological pain related to MS. Patient notes weakness from hips down.  and weakness in Rt hand that has just recently started in January. Notes numbness from Rt arm to /through Rt LE. no numbness in Lt Leg. "My left leg was the weaker but i have found its stronger now. "  Patient has hand weights and bands at home that he uses for strengthening.    Currently in Pain? No/denies                         [facility] Adult PT Treatment/Exercise - 09/04/15 0001    Transfers   Transfers Sit to Stand   Sit to Stand 4: Min assist  From table +6in box; MinA for fwd pelvis and TKE  bilat   Comments 4sets of 5 2 minutes rest between, no AD    Ambulation/Gait   Pre-Gait Activities Half kneeling 5 minutes bilat  Max assist for getting up, chair for trunk support   Knee/Hip Exercises: Stretches   Active Hamstring Stretch Both;3 reps;30 seconds  seated, heel on 6" box   Gastroc Stretch 3 reps;30 seconds  standing with forefeet on dumbbells, ModA, cues for TKE 5x3s   Other Knee/Hip Stretches In half kneeling:  hip flexor stretch 1x60sec; lateral weight shift 1x90sec                PT Education - 09/04/15 2205    Education provided Yes   Education Details added standing gastroc/soleus stretching, discussed safe options to perform.    Person(s) Educated Patient    Methods Explanation;Demonstration   Comprehension Tactile cues required;Verbalized understanding          PT Short Term Goals - 07/31/15 1742    PT SHORT TERM GOAL #1   Title Pt will demonstrate independence in beginning home exercise program by twos weeks after commencement of therapy, to affirm self-efficacy in work at home to making progress toward goals.   PT SHORT TERM GOAL #2   Title After 2 weeks, pt will describe in detail 3 ways to manage exacerbation of symptoms at home to demonstrate greater self-efficacy in self-management of wellness and function.    PT SHORT TERM GOAL #3   Title Pt will improve functional  mobility by the 4th week evident by TUG time <44 seconds to demonstrate improved indep in functional mobility and to decrease risk of falls in the home.            PT Long Term Goals - 07/31/15 1744    PT LONG TERM GOAL #1   Title Pt will demonstrate independence in advanced home exercise program by 1 week prior to discharge, to further self-efficacy in continuation of progress toward goals after discharge from therapy.    PT LONG TERM GOAL #2   Title Pt will ambulate 446ft c RW and s exacerbation of symptoms after 8 weeks, to demonstrate improved activity tolerance and improved independence in limited community ambulation.    PT LONG TERM GOAL #3   Title After 8 weeks, pt will improve functional mobility as evidence by TUG time of <35 seconds to demonstrate improved indep in functional mobility and to decrease risk of falls in the home.                Plan - 09/04/15 2206    Clinical Impression Statement Pt reports continued fatigue since infection last week. Pt presenting with more facial palor that typical, hence quesitoning and vitals taken to R/O symptomatic anemia. Nailbeds pink with immediate capilary refill, 140's/80's, HR in 80's, no additional concerns at this time. Pt does report some DOE, SOB, but is not concerned. Pt making progress toward  improving funcitonal LE strength in trasnfers, and trunk strength in half/full kneeling.  Ability to tolerate standing for long duration still very limited by hypertoniciity in hamstrings and calves bilat.    Pt will benefit from skilled therapeutic intervention in order to improve on the following deficits Abnormal gait;Decreased strength;Difficulty walking;Decreased mobility;Decreased balance;Improper body mechanics;Decreased activity tolerance;Decreased endurance;Decreased coordination;Decreased range of motion;Impaired flexibility   Rehab Potential Fair   Clinical Impairments Affecting Rehab Potential Multiple sclerosis and Sarcoidosis.    PT Frequency 1x / week   PT Duration 8 weeks   PT Treatment/Interventions Therapeutic exercise;Balance  training;Gait training;Patient/family education;Functional mobility training;Therapeutic activities;DME Instruction   PT Next Visit Plan Body weight support over ground training?: Revisit standing calves stretch, continue wtih 4-point, half kneeling, and full kneeling core strengthening. SAQ and TKE for standing stability.    PT Home Exercise Plan Standing soleus stretch.    Consulted and Agree with Plan of Care Patient        Problem List Patient Active Problem List   Diagnosis Date Noted  . Hyperlipidemia 08/13/2013  . Venous insufficiency 08/13/2013  . CAD (coronary artery disease) 08/13/2013  . MS (multiple sclerosis) (HCC) 08/03/2013  . Spasticity 08/03/2013  . Obstructive sleep apnea 11/29/2012  . Sarcoidosis (HCC) 05/19/2012  . Paraplegia (HCC) 05/19/2012  . Multiple sclerosis exacerbation (HCC) 05/07/2012  . Fever 05/07/2012  . HTN (hypertension) 05/07/2012  . COPD (chronic obstructive pulmonary disease) (HCC) 05/07/2012  . Chronic pain 05/07/2012  . Polysubstance abuse 05/07/2012  . Lower extremity pain 05/07/2012    Kimyatta Lecy C 09/04/2015, 10:14 PM  10:14 PM  Rosamaria Lints, PT, DPT [place] License # [id number]       [facility] [address] [place], [place], [address] Phone: [phone]   Fax:  [phone]  Name: Chad Vasquez MRN: [id number] Date of Birth: 03-Feb-1961

## 2015-09-11 ENCOUNTER — Ambulatory Visit (HOSPITAL_COMMUNITY): Payer: Medicare Other

## 2015-09-11 DIAGNOSIS — Z7409 Other reduced mobility: Secondary | ICD-10-CM

## 2015-09-11 DIAGNOSIS — M6289 Other specified disorders of muscle: Secondary | ICD-10-CM

## 2015-09-11 DIAGNOSIS — R29898 Other symptoms and signs involving the musculoskeletal system: Secondary | ICD-10-CM

## 2015-09-11 DIAGNOSIS — R269 Unspecified abnormalities of gait and mobility: Secondary | ICD-10-CM

## 2015-09-11 DIAGNOSIS — R262 Difficulty in walking, not elsewhere classified: Secondary | ICD-10-CM

## 2015-09-11 NOTE — Therapy (Signed)
[facility] [address] [place], [place], [address] Phone: [phone]   Fax:  [phone]  Physical Therapy Treatment  Patient Details  Name: Chad Vasquez MRN: [id number] Date of Birth: [birth year] Referring Provider: Gerilyn Pilgrim  Encounter Date: 09/11/2015      PT End of Session - 09/11/15 1556    Visit Number 6   Number of Visits 8   Date for PT Re-Evaluation 09/30/15   Authorization Type Medicare   Authorization - Visit Number 6   Authorization - Number of Visits 8   PT Start Time 1302   PT Stop Time 1348   PT Time Calculation (min) 46 min   Equipment Utilized During Treatment Gait belt;Other (comment)  body weight support system, RW, 18 inch box for trunk support   Activity Tolerance Patient tolerated treatment well;Patient limited by fatigue   Behavior During Therapy [facility] for tasks assessed/performed      Past Medical History  Diagnosis Date  . Multiple sclerosis 2010  . HTN (hypertension)   . Polysubstance abuse   . Anxiety and depression   . Chronic pain   . Coronary artery disease   . Hyperlipidemia   . Sleep apnea     Past Surgical History  Procedure Laterality Date  . Mediastinoscopy  05/15/2012    Procedure: MEDIASTINOSCOPY;  Surgeon: Loreli Slot, MD;  Location: [facility] OR;  Service: Thoracic;  Laterality: N/A;  . Cholecystectomy N/A 04/16/2013    Procedure: LAPAROSCOPIC CHOLECYSTECTOMY;  Surgeon: Dalia Heading, MD;  Location: [facility] ORS;  Service: General;  Laterality: N/A;  . Cardiac catheterization  2005    There were no vitals filed for this visit.  Visit Diagnosis:  Impaired functional mobility and activity tolerance  Weakness of both lower extremities  Abnormality of gait  Difficulty walking  Muscle tightness      Subjective Assessment - 09/11/15 1554    Subjective Pt says he has felt extrmemly tired over th epast couple of days, sleeping a lot. He reports having integrated some of the new  stretches at home with some success.    Pertinent History Patient has had a 5 year history of known MS since 2010. Patient states recieving steroid shots that benefited him but has had pneumonia 3x in last 5 years exacerbating weakness. Patient contracted Sarcoidosis in August 2013 resulting in difficulty walking and using legs ever since. patient states he was refferred to PT by Dr. Gerilyn Pilgrim to assess his strength and flexibility to help his walking. Patient notes Rt leg seems to be really stiff secondary to spasticity and cramping that is especially pronounced at night. No pain today. not4es occasioanl pain every day in Rt lower leg, he thought it was diabetic pain, but patient does not have diabetes and he believes per MD that it is a neurological pain related to MS. Patient notes weakness from hips down.  and weakness in Rt hand that has just recently started in January. Notes numbness from Rt arm to /through Rt LE. no numbness in Lt Leg. "My left leg was the weaker but i have found its stronger now. "  Patient has hand weights and bands at home that he uses for strengthening.    Currently in Pain? No/denies      Neuromuscular Reeducation: Entire session spent in body weight support system over ground to improve ability to perform activities with greater tolerance and volume:   Interventions included:  1. Sit to Stand transfers x6 (mod-Total Assist) 2.  Walking over ground with RW 110ft with rest breaks every 35.  3. Crawling in 4-point x 3 minutes 4. Floor to standing training x3 minutes 5. Trunk control/balance in full kneeling 2x60s.  6. Trunk control and stretching in half kneeling 1x90s bilat, Total Assist, (Left more difficult) 7. Standing calf stretches 3x60s                           PT Education - 09/11/15 1556    Education provided Yes   Education Details Discussed benefits and options available with using  the bodyweight support system,    Person(s) Educated  Patient   Methods Explanation;Demonstration   Comprehension Verbalized understanding;Returned demonstration          PT Short Term Goals - 07/31/15 1742    PT SHORT TERM GOAL #1   Title Pt will demonstrate independence in beginning home exercise program by twos weeks after commencement of therapy, to affirm self-efficacy in work at home to making progress toward goals.   PT SHORT TERM GOAL #2   Title After 2 weeks, pt will describe in detail 3 ways to manage exacerbation of symptoms at home to demonstrate greater self-efficacy in self-management of wellness and function.    PT SHORT TERM GOAL #3   Title Pt will improve functional  mobility by the 4th week evident by TUG time <44 seconds to demonstrate improved indep in functional mobility and to decrease risk of falls in the home.            PT Long Term Goals - 07/31/15 1744    PT LONG TERM GOAL #1   Title Pt will demonstrate independence in advanced home exercise program by 1 week prior to discharge, to further self-efficacy in continuation of progress toward goals after discharge from therapy.    PT LONG TERM GOAL #2   Title Pt will ambulate 432ft c RW and s exacerbation of symptoms after 8 weeks, to demonstrate improved activity tolerance and improved independence in limited community ambulation.    PT LONG TERM GOAL #3   Title After 8 weeks, pt will improve functional mobility as evidence by TUG time of <35 seconds to demonstrate improved indep in functional mobility and to decrease risk of falls in the home.                Plan - 09/11/15 1557    Clinical Impression Statement Pt tolerating session well today, able to skip most of the stretching and dive immediately into body weight support system activites. Pt's ability to perform all tasks increased in terms of longevity, however, pt visually fatigued, sweating and SOB, but glad to be doing so much. Will continue to progress HEP fluency, for greater home independence  after DC.    Pt will benefit from skilled therapeutic intervention in order to improve on the following deficits Abnormal gait;Decreased strength;Difficulty walking;Decreased mobility;Decreased balance;Improper body mechanics;Decreased activity tolerance;Decreased endurance;Decreased coordination;Decreased range of motion;Impaired flexibility   Rehab Potential Fair   Clinical Impairments Affecting Rehab Potential Multiple sclerosis and Sarcoidosis.    PT Frequency 1x / week   PT Duration 8 weeks   PT Treatment/Interventions Therapeutic exercise;Balance training;Gait training;Patient/family education;Functional mobility training;Therapeutic activities;DME Instruction   PT Next Visit Plan Continue with body weight support over ground training (no appropriate for treadmill at this time) : Continue with isolated LE strengthening of hips, knees, and trunk.    PT Home Exercise Plan No changes. Discussed modifications  to his home made RLE gait assist system.    Consulted and Agree with Plan of Care Patient        Problem List Patient Active Problem List   Diagnosis Date Noted  . Hyperlipidemia 08/13/2013  . Venous insufficiency 08/13/2013  . CAD (coronary artery disease) 08/13/2013  . MS (multiple sclerosis) (HCC) 08/03/2013  . Spasticity 08/03/2013  . Obstructive sleep apnea 11/29/2012  . Sarcoidosis (HCC) 05/19/2012  . Paraplegia (HCC) 05/19/2012  . Multiple sclerosis exacerbation (HCC) 05/07/2012  . Fever 05/07/2012  . HTN (hypertension) 05/07/2012  . COPD (chronic obstructive pulmonary disease) (HCC) 05/07/2012  . Chronic pain 05/07/2012  . Polysubstance abuse 05/07/2012  . Lower extremity pain 05/07/2012    Ritu Gagliardo C 09/11/2015, 4:03 PM  4:03 PM  Rosamaria Lints, PT, DPT [place] License # [id number]       [facility] [address] [place], [place], [address] Phone: [phone]   Fax:  [phone]  Name: Chad Vasquez MRN:  [id number] Date of Birth: [birth year]

## 2015-09-18 ENCOUNTER — Ambulatory Visit (HOSPITAL_COMMUNITY): Payer: Medicare Other

## 2015-09-18 DIAGNOSIS — Z7409 Other reduced mobility: Secondary | ICD-10-CM

## 2015-09-18 DIAGNOSIS — M6289 Other specified disorders of muscle: Secondary | ICD-10-CM

## 2015-09-18 DIAGNOSIS — R269 Unspecified abnormalities of gait and mobility: Secondary | ICD-10-CM

## 2015-09-18 DIAGNOSIS — R29898 Other symptoms and signs involving the musculoskeletal system: Secondary | ICD-10-CM

## 2015-09-18 DIAGNOSIS — R262 Difficulty in walking, not elsewhere classified: Secondary | ICD-10-CM

## 2015-09-18 NOTE — Therapy (Signed)
[place], [place], [address] Phone: [phone]   Fax:  [phone]  Physical Therapy Treatment  Patient Details  Name: Chad Vasquez MRN: [id number] Date of Birth: 07/18/1961 Referring Provider: Dr. Merlene Laughter  Encounter Date: 09/18/2015      PT End of Session - 09/18/15 1548    Visit Number 7   Number of Visits 8   Date for PT Re-Evaluation 09/30/15   Authorization Type Medicare   Authorization - Visit Number 7   Authorization - Number of Visits 8   PT Start Time 1440   PT Stop Time 0630   PT Time Calculation (min) 53 min   Activity Tolerance Patient tolerated treatment well;No increased pain   Behavior During Therapy [facility] for tasks assessed/performed      Past Medical History  Diagnosis Date  . Multiple sclerosis 2010  . HTN (hypertension)   . Polysubstance abuse   . Anxiety and depression   . Chronic pain   . Coronary artery disease   . Hyperlipidemia   . Sleep apnea     Past Surgical History  Procedure Laterality Date  . Mediastinoscopy  05/15/2012    Procedure: MEDIASTINOSCOPY;  Surgeon: Melrose Nakayama, MD;  Location: [facility];  Service: Thoracic;  Laterality: N/A;  . Cholecystectomy N/A 04/16/2013    Procedure: LAPAROSCOPIC CHOLECYSTECTOMY;  Surgeon: Jamesetta So, MD;  Location: [facility];  Service: General;  Laterality: N/A;  . Cardiac catheterization  2005    There were no vitals filed for this visit.  Visit Diagnosis:  Impaired functional mobility and activity tolerance  Weakness of both lower extremities  Abnormality of gait  Difficulty walking  Muscle tightness      Subjective Assessment - 09/18/15 1443    Subjective Pt reporting he is feeling ok, pain free, but continues to maintain a decrease in overall energy level. Pt thinkgs it may be related to change in his prednisone Rx (suspected new manufacture) and plans to follow up with Dr. Merlene Laughter.  Regarding overall progress  with therapy, pt feels as though he has gained a great deal of knowledge in self care and new ways to continue to work on daily moblity, strength, and flexibility. Although he acknowledges that improvements to his overall ambulation have not improved sustanially., he conceeds that his diagnosis is unpredictable at times.    Pertinent History Patient has had a 5 year history of known MS since 2010. Patient states recieving steroid shots that benefited him but has had pneumonia 3x in last 5 years exacerbating weakness. Patient contracted Sarcoidosis in August 2013 resulting in difficulty walking and using legs ever since. patient states he was refferred to PT by Dr. Merlene Laughter to assess his strength and flexibility to help his walking. Patient notes Rt leg seems to be really stiff secondary to spasticity and cramping that is especially pronounced at night. No pain today. not4es occasioanl pain every day in Rt lower leg, he thought it was diabetic pain, but patient does not have diabetes and he believes per MD that it is a neurological pain related to MS. Patient notes weakness from hips down.  and weakness in Rt hand that has just recently started in January. Notes numbness from Rt arm to /through Rt LE. no numbness in Lt Leg. "My left leg was the weaker but i have found its stronger now. "  Patient has hand weights and bands at home that he uses for strengthening.  How long can you sit comfortably? No limitations (no change)    How long can you stand comfortably? 10 minutes before onset of knee buckling (same as before, however he feels knee buckling is not a problem, so much as knees locking up.    How long can you walk comfortably? Unable to perform much walking today, the last few weeks. 186f (estimate 5-6 minutes) at evaluation.    Patient Stated Goals Would like to improve walking tolerance and distance.    Currently in Pain? No/denies            [facility]    Assessment    Medical Diagnosis MS and unsteady gait   Referring Provider Dr. DMerlene Laughter  Hand Dominance Left   Prior Therapy yes   Balance Screen   Has the patient fallen in the past 6 months Yes   How many times? 1  legs locking up   Has the patient had a decrease in activity level because of a fear of falling?  Yes   Is the patient reluctant to leave their home because of a fear of falling?  Yes   [place]   Living Arrangements Spouse/significant other   Available Help at Discharge Family;Friend(s)   Type of HRalstonAccess Level entry   HCandler-McAfeeOne level   H[place]akey- 2 wheels;Cane - single point;Crutches;Wheelchair - manual;Grab bars - toilet;Grab bars - tub/shower   Prior Function   Level of Independence Needs assistance with homemaking;Independent with basic ADLs;Independent with gait   Other:   Other/ Comments TUG: unable to perform; At Eval- 53.8   AROM   Right Ankle Dorsiflexion 8   Left Ankle Dorsiflexion 7   Strength   Right Hip Flexion 1/5  same at eval   Right Hip ABduction 5/5  same at eval   Right Hip ADduction 5/5  same at eval   Left Hip Flexion 2/5  5/5 at eval   Left Hip ABduction 5/5  same at eval   Left Hip ADduction 5/5  same at eval   Right Knee Flexion 5/5  4/5 at eval   Right Knee Extension 4+/5  3+ at eval   Left Knee Flexion 5/5  4/5 at eval   Left Knee Extension 5/5  same at eval   Right Ankle Dorsiflexion 3-/5  1/5 at eval   Left Ankle Dorsiflexion 4+/5  5/5 at eval   Flexibility   Soft Tissue Assessment /Muscle Length yes   Hamstrings L: 144 degrees (125 at eval); R: 142 degrees (125)   Transfers   Transfers Sit to Stand   Sit to Stand 6: Modified independent (Device/Increase time);With upper extremity assist   Comments 2x10   Balance   Balance Assessed Yes   Static Sitting Balance   Static Sitting - Balance Support No upper extremity supported;Feet supported    Static Sitting - Level of Assistance 6: Modified independent (Device/Increase time)   Static Standing Balance   Static Standing - Balance Support No upper extremity supported   Static Standing - Level of Assistance 6: Modified independent (Device/Increase time)   Static Standing - Comment/# of Minutes 20 seconds                     [facility] Adult PT Treatment/Exercise - 09/18/15 0001    Knee/Hip Exercises: Stretches   Active Hamstring Stretch 30 seconds;Both;3 reps  standing  at // bars c balls of feet on 2" riser.    Passive Hamstring Stretch 30 seconds;4 reps  MET in supine   Soleus Stretch Both;3 reps;30 seconds  Supine, MET   Other Knee/Hip Stretches Seated Prayer Stretch  hands clasped, elbows on table, trunk flexion 3x30sec   Knee/Hip Exercises: Seated   Sit to Sand 2 sets;10 reps;with UE support  LUE on //bar                PT Education - 09/18/15 1547    Education provided Yes   Education Details Explained continued benefits of shoulder stretches, as decline in mobility and function are related more to scapular tightness than weakness within the UE.    Person(s) Educated Patient   Methods Explanation;Demonstration   Comprehension Verbalized understanding;Returned demonstration          PT Short Term Goals - 09/18/15 1555    PT SHORT TERM GOAL #1   Title Pt will demonstrate independence in beginning home exercise program by twos weeks after commencement of therapy, to affirm self-efficacy in work at home to making progress toward goals.   Status Achieved   PT SHORT TERM GOAL #2   Title After 2 weeks, pt will describe in detail 3 ways to manage exacerbation of symptoms at home to demonstrate greater self-efficacy in self-management of wellness and function.    Status Achieved   PT SHORT TERM GOAL #3   Title Pt will improve functional  mobility by the 4th week evident by TUG time <44 seconds to demonstrate improved indep in functional mobility and to  decrease risk of falls in the home.    Baseline Pt sustained an acute exacerbation during this admission and ambulatory function was compromised substatially.    Status Not Met           PT Long Term Goals - 09/18/15 1556    PT LONG TERM GOAL #1   Title Pt will demonstrate independence in advanced home exercise program by 1 week prior to discharge, to further self-efficacy in continuation of progress toward goals after discharge from therapy.    Status Achieved   PT LONG TERM GOAL #2   Title Pt will ambulate 452f c RW and s exacerbation of symptoms after 8 weeks, to demonstrate improved activity tolerance and improved independence in limited community ambulation.    Baseline Ambulation no longer a focus of therapy after exacerbation.    Status Not Met   PT LONG TERM GOAL #3   Title After 8 weeks, pt will improve functional mobility as evidence by TUG time of <35 seconds to demonstrate improved indep in functional mobility and to decrease risk of falls in the home.    Baseline Ambulation no longer a focus of therapy after exacerbation.    Status Not Met               Plan - 09/18/15 1550    Clinical Impression Statement Pt continues to display chronic fatigue that he has been describing for three weeks now. Pt demonstrating excellent strength today with MMT revealing equal or minutely improved strength throughout the BLE, with continued profound loss of strength with L hip flexion, which has limited general mobility more than anything. Pt demonstrating improved hamstrings length bilat, and ankle DF ROM that is improved, but not statistically significant. Trunk strength has improved considerabbly since evalution with better endurance in sitting without UE support, and more erect posture in standing. Pt is pleased with all education  and treatment at this point and is comfortable DC PT at this time to independent execution of a HEP for continued progress.    Pt will benefit from skilled  therapeutic intervention in order to improve on the following deficits Abnormal gait;Decreased strength;Difficulty walking;Decreased mobility;Decreased balance;Improper body mechanics;Decreased activity tolerance;Decreased endurance;Decreased coordination;Decreased range of motion;Impaired flexibility   Rehab Potential Fair   Clinical Impairments Affecting Rehab Potential Multiple sclerosis and Sarcoidosis.    PT Frequency 1x / week   PT Duration 8 weeks   PT Treatment/Interventions Therapeutic exercise;Balance training;Gait training;Patient/family education;Functional mobility training;Therapeutic activities;DME Instruction   PT Next Visit Plan All care and education completed.    PT Home Exercise Plan Updated to add stretching for shoulders and trunk.    Consulted and Agree with Plan of Care Patient          G-Codes - 10-15-2015 1605    Functional Assessment Tool Used Skilled clinical observation of mobility, strength, gait, balance, functional activity tolerance    Functional Limitation Mobility: Walking and moving around   Mobility: Walking and Moving Around Current Status [id number]) At least 60 percent but less than 80 percent impaired, limited or restricted   Mobility: Walking and Moving Around Goal Status [phone]) At least 40 percent but less than 60 percent impaired, limited or restricted   Mobility: Walking and Moving Around Discharge Status [phone]) At least 80 percent but less than 100 percent impaired, limited or restricted      Problem List Patient Active Problem List   Diagnosis Date Noted  . Hyperlipidemia 08/13/2013  . Venous insufficiency 08/13/2013  . CAD (coronary artery disease) 08/13/2013  . MS (multiple sclerosis) ([place]) 08/03/2013  . Spasticity 08/03/2013  . Obstructive sleep apnea 11/29/2012  . Sarcoidosis ([place]) 05/19/2012  . Paraplegia ([place]) 05/19/2012  . Multiple sclerosis exacerbation ([place]) 05/07/2012  . Fever 05/07/2012  . HTN (hypertension) 05/07/2012  . COPD  (chronic obstructive pulmonary disease) ([place]) 05/07/2012  . Chronic pain 05/07/2012  . Polysubstance abuse 05/07/2012  . Lower extremity pain 05/07/2012   PHYSICAL THERAPY DISCHARGE SUMMARY  Visits from Start of Care: 7  Current functional level related to goals / functional outcomes: *see above   Remaining deficits: *See Above    Education / Equipment: *See above Plan: Patient agrees to discharge.  Patient goals were partially met. Patient is being discharged due to financial reasons.  ?????       , C 15-Oct-2015, 4:07 PM  4:07 PM  Etta Grandchild, PT, DPT [place] License # [id number]       [facility] [address] [place], [place], [address] Phone: [phone]   Fax:  [phone]  Name: JERAMIA SALEEBY MRN: [id number] Date of Birth: Mar 29, 1961

## 2015-10-20 ENCOUNTER — Encounter (HOSPITAL_COMMUNITY)
Admission: RE | Admit: 2015-10-20 | Discharge: 2015-10-20 | Disposition: A | Discharge status: Home / Self Care | Payer: Medicare Other | Source: Ambulatory Visit | Attending: Neurology | Admitting: Neurology

## 2015-10-20 DIAGNOSIS — I10 Essential (primary) hypertension: Secondary | ICD-10-CM | POA: Insufficient documentation

## 2015-10-20 DIAGNOSIS — D860 Sarcoidosis of lung: Secondary | ICD-10-CM | POA: Diagnosis not present

## 2015-10-20 DIAGNOSIS — M549 Dorsalgia, unspecified: Secondary | ICD-10-CM | POA: Diagnosis not present

## 2015-10-20 DIAGNOSIS — R5383 Other fatigue: Secondary | ICD-10-CM | POA: Diagnosis not present

## 2015-10-20 DIAGNOSIS — G35 Multiple sclerosis: Secondary | ICD-10-CM | POA: Diagnosis not present

## 2015-10-20 DIAGNOSIS — K219 Gastro-esophageal reflux disease without esophagitis: Secondary | ICD-10-CM | POA: Diagnosis not present

## 2015-10-20 DIAGNOSIS — R252 Cramp and spasm: Secondary | ICD-10-CM | POA: Diagnosis not present

## 2015-10-20 MED ORDER — METHYLPREDNISOLONE SODIUM SUCC 125 MG IJ SOLR
1000.0000 mg | Freq: Once | INTRAMUSCULAR | Status: DC
  Administered 2015-10-20: 1000 mg via INTRAVENOUS
  Filled 2015-10-20: qty 16

## 2015-10-20 MED ORDER — SODIUM CHLORIDE 0.9 % IV SOLN
INTRAVENOUS | Status: DC
  Administered 2015-10-20: 200 mL via INTRAVENOUS

## 2015-10-20 MED ORDER — SODIUM CHLORIDE 0.9 % IV SOLN
1000.0000 mg | Freq: Once | INTRAVENOUS | Status: DC
  Filled 2015-10-20: qty 8

## 2015-10-20 NOTE — Progress Notes (Signed)
Tolerated solumedrol infusion well. Iv saline locked. To return tomorrow for 2nd dose.

## 2015-10-21 ENCOUNTER — Encounter (HOSPITAL_COMMUNITY)
Admission: RE | Admit: 2015-10-21 | Discharge: 2015-10-21 | Disposition: A | Discharge status: Home / Self Care | Payer: Medicare Other | Source: Ambulatory Visit | Attending: Neurology | Admitting: Neurology

## 2015-10-21 DIAGNOSIS — D860 Sarcoidosis of lung: Secondary | ICD-10-CM | POA: Diagnosis not present

## 2015-10-21 MED ORDER — SODIUM CHLORIDE 0.9 % IV SOLN
1000.0000 mg | Freq: Every day | INTRAVENOUS | Status: DC
  Administered 2015-10-21: 1000 mg via INTRAVENOUS
  Filled 2015-10-21: qty 8

## 2015-10-21 MED ORDER — SODIUM CHLORIDE 0.9 % IV SOLN
INTRAVENOUS | Status: DC
  Administered 2015-10-21: 200 mL via INTRAVENOUS

## 2015-10-22 ENCOUNTER — Encounter (HOSPITAL_COMMUNITY)
Admission: RE | Admit: 2015-10-22 | Discharge: 2015-10-22 | Disposition: A | Discharge status: Home / Self Care | Payer: Medicare Other | Source: Ambulatory Visit | Attending: Neurology | Admitting: Neurology

## 2015-10-22 DIAGNOSIS — D860 Sarcoidosis of lung: Secondary | ICD-10-CM | POA: Diagnosis not present

## 2015-10-22 MED ORDER — METHYLPREDNISOLONE SODIUM SUCC 125 MG IJ SOLR: 125.0000 mg | INTRAMUSCULAR | Status: DC

## 2015-10-22 MED ORDER — METHYLPREDNISOLONE SODIUM SUCC 125 MG IJ SOLR: 1000.0000 mg | Freq: Once | INTRAMUSCULAR | Status: DC

## 2015-10-22 MED ORDER — SODIUM CHLORIDE 0.9 % IV SOLN
INTRAVENOUS | Status: DC
  Administered 2015-10-22: 250 mL via INTRAVENOUS

## 2015-10-22 MED ORDER — SODIUM CHLORIDE 0.9 % IV SOLN
1000.0000 mg | Freq: Once | INTRAVENOUS | Status: AC
  Administered 2015-10-22: 1000 mg via INTRAVENOUS
  Filled 2015-10-22: qty 8

## 2015-11-10 DIAGNOSIS — Z79899 Other long term (current) drug therapy: Secondary | ICD-10-CM | POA: Diagnosis not present

## 2015-11-10 DIAGNOSIS — K219 Gastro-esophageal reflux disease without esophagitis: Secondary | ICD-10-CM | POA: Diagnosis not present

## 2015-11-10 DIAGNOSIS — R5383 Other fatigue: Secondary | ICD-10-CM | POA: Diagnosis not present

## 2015-11-10 DIAGNOSIS — M549 Dorsalgia, unspecified: Secondary | ICD-10-CM | POA: Diagnosis not present

## 2015-11-10 DIAGNOSIS — I10 Essential (primary) hypertension: Secondary | ICD-10-CM | POA: Diagnosis not present

## 2015-11-10 DIAGNOSIS — G35 Multiple sclerosis: Secondary | ICD-10-CM | POA: Diagnosis not present

## 2015-11-10 DIAGNOSIS — R252 Cramp and spasm: Secondary | ICD-10-CM | POA: Diagnosis not present

## 2015-11-10 DIAGNOSIS — D860 Sarcoidosis of lung: Secondary | ICD-10-CM | POA: Diagnosis not present

## 2015-11-13 DIAGNOSIS — B353 Tinea pedis: Secondary | ICD-10-CM | POA: Diagnosis not present

## 2015-11-13 DIAGNOSIS — R601 Generalized edema: Secondary | ICD-10-CM | POA: Diagnosis not present

## 2015-11-17 DIAGNOSIS — E782 Mixed hyperlipidemia: Secondary | ICD-10-CM | POA: Diagnosis not present

## 2015-11-17 DIAGNOSIS — E039 Hypothyroidism, unspecified: Secondary | ICD-10-CM | POA: Diagnosis not present

## 2015-11-17 DIAGNOSIS — G35 Multiple sclerosis: Secondary | ICD-10-CM | POA: Diagnosis not present

## 2015-11-17 DIAGNOSIS — I10 Essential (primary) hypertension: Secondary | ICD-10-CM | POA: Diagnosis not present

## 2015-11-17 DIAGNOSIS — Z6833 Body mass index (BMI) 33.0-33.9, adult: Secondary | ICD-10-CM | POA: Diagnosis not present

## 2015-11-17 DIAGNOSIS — Z1389 Encounter for screening for other disorder: Secondary | ICD-10-CM | POA: Diagnosis not present

## 2015-12-01 ENCOUNTER — Encounter (HOSPITAL_COMMUNITY): Payer: Self-pay

## 2015-12-01 ENCOUNTER — Ambulatory Visit (HOSPITAL_COMMUNITY): Payer: Medicare Other | Attending: Neurology

## 2015-12-01 DIAGNOSIS — R2681 Unsteadiness on feet: Secondary | ICD-10-CM | POA: Diagnosis not present

## 2015-12-01 DIAGNOSIS — G729 Myopathy, unspecified: Secondary | ICD-10-CM | POA: Insufficient documentation

## 2015-12-01 DIAGNOSIS — Z7409 Other reduced mobility: Secondary | ICD-10-CM | POA: Diagnosis not present

## 2015-12-01 DIAGNOSIS — R262 Difficulty in walking, not elsewhere classified: Secondary | ICD-10-CM | POA: Diagnosis not present

## 2015-12-01 DIAGNOSIS — M6289 Other specified disorders of muscle: Secondary | ICD-10-CM

## 2015-12-01 DIAGNOSIS — R29898 Other symptoms and signs involving the musculoskeletal system: Secondary | ICD-10-CM | POA: Insufficient documentation

## 2015-12-01 DIAGNOSIS — R269 Unspecified abnormalities of gait and mobility: Secondary | ICD-10-CM | POA: Diagnosis not present

## 2015-12-01 NOTE — Addendum Note (Signed)
Addended by: Samara Deist on: 12/01/2015 05:32 PM   Modules accepted: Medications

## 2015-12-01 NOTE — Therapy (Signed)
[facility] [address] [place], [place], [address] Phone: [phone]   Fax:  [phone]  Physical Therapy Evaluation  Patient Details  Name: Chad Vasquez MRN: [id number] Date of Birth: 23-Apr-1961 Referring Provider: Dr. Beryle Beams  Encounter Date: 12/01/2015      PT End of Session - 12/01/15 1345    Visit Number 1   Number of Visits 8   Date for PT Re-Evaluation 12/31/15   Authorization Type BCBS Medicare   Authorization Time Period 12/01/2015 to 01/26/2016   PT Start Time 1302   PT Stop Time 1347   PT Time Calculation (min) 45 min   Equipment Utilized During Treatment Gait belt   Activity Tolerance Patient tolerated treatment well   Behavior During Therapy [facility] for tasks assessed/performed      Past Medical History  Diagnosis Date  . Multiple sclerosis (HCC) 2010  . HTN (hypertension)   . Polysubstance abuse   . Anxiety and depression   . Chronic pain   . Coronary artery disease   . Hyperlipidemia   . Sleep apnea     Past Surgical History  Procedure Laterality Date  . Mediastinoscopy  05/15/2012    Procedure: MEDIASTINOSCOPY;  Surgeon: Loreli Slot, MD;  Location: [facility] OR;  Service: Thoracic;  Laterality: N/A;  . Cholecystectomy N/A 04/16/2013    Procedure: LAPAROSCOPIC CHOLECYSTECTOMY;  Surgeon: Dalia Heading, MD;  Location: [facility] ORS;  Service: General;  Laterality: N/A;  . Cardiac catheterization  2005    There were no vitals filed for this visit.  Visit Diagnosis:  Impaired functional mobility and activity tolerance - Plan: PT plan of care cert/re-cert  Weakness of both lower extremities - Plan: PT plan of care cert/re-cert  Unsteadiness on feet - Plan: PT plan of care cert/re-cert  Abnormality of gait - Plan: PT plan of care cert/re-cert  Difficulty walking - Plan: PT plan of care cert/re-cert  Muscle tightness - Plan: PT plan of care cert/re-cert      Subjective Assessment - 12/01/15 1312     Subjective Chad Vasquez is a 55 yo male who currently c/o B LE pain, LE weakness, and difficulty with walking due to hx of MS and deconditioning. The pt denied pain upon arrival to PT clinic, but stated that he does experience B LE pain that ranges between 0-8/10 on a VAS. Pt admitted minimal compliance with his previously prescribed HEP and noted "it's hard to get the exercises done". However, he noted that he is highly motivated "to get stronger and move better".      Pertinent History MS, Sarcoidosis, SAD, HTN    How long can you sit comfortably? No limitations   How long can you stand comfortably? 1 minute    How long can you walk comfortably? ~15-20 feet with FWW    Diagnostic tests N/A    Patient Stated Goals Pt's goal is to improve his ability to stand and ambulate, and improve his strength.    Currently in Pain? No/denies   Pain Score 0-No pain  B LE pain ranges between a 0-8/10 on a VAS   Pain Location Leg   Pain Orientation Right;Left   Pain Descriptors / Indicators Aching;Burning   Pain Type Chronic pain   Pain Radiating Towards hip to foot, bilaterally    Pain Onset More than a month ago   Pain Frequency Intermittent   Aggravating Factors  WB activities and general LE movements    Pain Relieving  Factors rest and sitting    Effect of Pain on Daily Activities Limits the pt's ability to ambulate and stand for long periods of time    Multiple Pain Sites No            [facility] PT Assessment - 12/01/15 0001    Assessment   Medical Diagnosis MS and unsteady gait   Referring Provider Dr. Beryle Beams   Onset Date/Surgical Date 11/05/15   Hand Dominance Left   Next MD Visit 12/2015   Prior Therapy Yes, for same complaints    Precautions   Precautions Fall   Restrictions   Weight Bearing Restrictions No   Balance Screen   Has the patient fallen in the past 6 months No   Has the patient had a decrease in activity level because of a fear of falling?  Yes   Is the patient  reluctant to leave their home because of a fear of falling?  Yes   Home Environment   Living Environment Private residence   Living Arrangements Spouse/significant other   Available Help at Discharge Family;Friend(s)   Type of Home House   Home Access Level entry   Home Layout One level   Home Equipment Walker - 2 wheels;Cane - single point;Crutches;Wheelchair - manual;Grab bars - toilet;Grab bars - tub/shower   Prior Function   Level of Independence Needs assistance with homemaking;Independent with basic ADLs;Independent with gait   Cognition   Overall Cognitive Status Within Functional Limits for tasks assessed   Coordination   Fine Motor Movements are Fluid and Coordinated Yes          Posture/Postural Control   Posture/Postural Control Postural limitations   Postural Limitations Forward head;Rounded Shoulders;Increased thoracic kyphosis;Posterior pelvic tilt   AROM   Overall AROM Comments B UE AROM is WFL; Limitations assessed with bilateral ankle DF ROM due to passive insufficiency of gastroc-soleus   Right Ankle Dorsiflexion 3 deg   Left Ankle Dorsiflexion 4 deg   Strength ( MMT)   Right Hip Flexion 1/5    Right Hip ABduction 4-/5    Right Hip ADduction 4-/5    Left Hip Flexion 2/5   Left Hip ABduction 4-/5   Left Hip ADduction 4-/5   Right Knee Flexion 3+/5   Right Knee Extension 3/5   Left Knee Flexion 4-/5   Left Knee Extension 4-/5   Right Ankle Dorsiflexion 3-/5   Left Ankle Dorsiflexion 4+/5   Flexibility   Soft Tissue Assessment /Muscle Length yes   Hamstrings 50% limited in HS 90/90 position, bilaterally    Transfers   Transfers Sit to Stand;Stand to Sit;Stand Pivot Transfers   Sit to Stand 6: Modified independent (Device/Increase time);With upper extremity assist   Five time sit to stand comments  48 sec with B UE assist required   Stand to Sit 4: Min guard;Uncontrolled descent;With upper extremity assist   Stand Pivot Transfers 4: Min guard    Requires  definite need for B UE assist    Ambulation/Gait   Ambulation/Gait Yes   Ambulation/Gait Assistance 4: Min guard;Other (comment)  with gait belt donned   Ambulation Distance (Feet) 20 Feet  x 2   Assistive device Rolling walker   Gait Pattern Step-through pattern;Decreased dorsiflexion - right   Ambulation Surface Indoor;Level   Balance   Balance Assessed Yes   Static Sitting Balance   Static Sitting - Balance Support No upper extremity supported;Feet supported   Static Sitting - Level of Assistance 6: Modified independent (  Device/Increase time)   Static Standing Balance   Static Standing - Balance Support No upper extremity supported   Static Standing - Level of Assistance 4: Min assist;5: Stand by assistance   Static Standing - Comment/# of Minutes Eyes open= 30 sec; Eyes closed= LOB at 3 sec   on level terrain with feet shoulder width apart   Timed Up and Go Test   Normal TUG (seconds) 64   TUG Comments with FWW                            PT Education - 12/01/15 1310    Education provided Yes   Education Details Educated pt on 5/5 fall precautions, PT eval findings, proper sit to stand transfer technique to ensure eccentric control, and edema management including B LE elevation    Person(s) Educated Patient   Methods Explanation   Comprehension Verbalized understanding          PT Short Term Goals - 12/01/15 1647    PT SHORT TERM GOAL #1   Title Patient will demonstrate independence in beginning HEP, to affirm self-efficacy in work at home to making progress toward goals.   Time 3   Period Weeks   Status New   PT SHORT TERM GOAL #2   Title Patient will independently verbalize 5/5 fall precautions in order to reduce the risk for falls with household ambulation.   Time 2   Period Weeks   Status New   PT SHORT TERM GOAL #3   Title Patient will improve TUG time <45 seconds to demonstrate improved indep in functional mobility and to decrease risk of  falls in the home.    Time 4   Period Weeks   Status New   PT SHORT TERM GOAL #4   Title Patient will independently demo improved eccentric control with stand to sit transfers in order to improve safety with transfers and reduce the risk for injury.    Time 4   Period Weeks   Status New           PT Long Term Goals - 12/01/15 1651    PT LONG TERM GOAL #1   Title Patient will demonstrate independence in advanced home exercise program by 1 week prior to discharge, to further self-efficacy in continuation of progress toward goals after discharge from therapy.    Time 8   Period Weeks   Status New   PT LONG TERM GOAL #2   Title Patient will ambulate 150' x 1 indoors on level terrain with the use of a FWW with improved heel to toe sequence and less severe B LE pain in oder to improve tolerance and safety with household ambulation.    Time 8   Period Weeks   Status New   PT LONG TERM GOAL #3   Title Patient will demo improved B LE strength by 1 MMT grade in order to improve performance with transfers and household ambulation.    Time 8   Period Weeks   Status New   PT LONG TERM GOAL #4   Title Patient will improve his 5 sit to stand time to < 25 seconds in order to improve tolerance with sit to stand transfers.    Time 8   Period Weeks   Status New   PT LONG TERM GOAL #5   Title Patient will improve B ankle DF AROM/PROM to 0-10 degrees in order to improve ankle  mobility with ambulation and standing activities.    Time 8   Period Weeks   Status New               Plan - 12-31-2015 1346    Clinical Impression Statement Chad Vasquez is a 55 yo male who was referred to outpatient PT to address current gait, strength, and ROM deficits associated with his hx of MS. The pt presents with impairments and limitations including B LE pain, B LE weakness (R worse than L), difficulty with sit <>stand transfers, difficulty with walking, B LE muscular tightness of B gastroc-soleus/HS/hip  flexors, decreased standing balance, and decreased activity tolerance. The pt is at a high risk for falls that was evident with TUG time of 64 seconds with the use of a FFW and LOB with EC with static standing at 3 seconds. Pt demo poor eccentric control with stand to sit transfer with instructions required on optimal body mechanics and technique in order to improve ease with transfer. Pt demo improved control once instructions provided, but continued to demo difficulty with descent.  In addition, pt presents with decreased R ankle DF during swing phase; however, the pt constructed a strap/bungee system that allows his foot to passively DF during swing phase. According to the pt, he was provided with a R AFO in the past, but it was uncomfortable and did not function well. The pt would benefit from skilled PT to address current limitations and impairments in order to progress towards improved functional mobility, gait, strength, balance, and ROM. Per pt request, PT frequency is limited to 1x/week due to high copay.    Pt will benefit from skilled therapeutic intervention in order to improve on the following deficits Abnormal gait;Decreased strength;Difficulty walking;Decreased mobility;Decreased balance;Improper body mechanics;Decreased activity tolerance;Decreased endurance;Decreased coordination;Decreased range of motion;Impaired flexibility;Pain   Rehab Potential Fair   Clinical Impairments Affecting Rehab Potential Multiple sclerosis and Sarcoidosis.    PT Frequency 1x / week   PT Duration 8 weeks   PT Treatment/Interventions Therapeutic exercise;Balance training;Gait training;Patient/family education;Functional mobility training;Therapeutic activities;DME Instruction;ADLs/Self Care Home Management;Neuromuscular re-education;Manual techniques;Energy conservation;Passive range of motion   PT Next Visit Plan Next PT tx to foucs on seated and supine LE strengthening ther ex, gait training with FWW, and static  standing balance training    PT Home Exercise Plan Added repeated sit to stand transfers to HEP   Consulted and Agree with Plan of Care Patient          G-Codes - 12-31-15 1659    Functional Assessment Tool Used Skilled clinical observation/assessment of functional mobility, strength, gait, balance, and functional activity tolerance    Functional Limitation Mobility: Walking and moving around   Mobility: Walking and Moving Around Current Status [phone]) At least 60 percent but less than 80 percent impaired, limited or restricted   Mobility: Walking and Moving Around Goal Status [phone]) At least 40 percent but less than 60 percent impaired, limited or restricted       Problem List Patient Active Problem List   Diagnosis Date Noted  . Hyperlipidemia 08/13/2013  . Venous insufficiency 08/13/2013  . CAD (coronary artery disease) 08/13/2013  . MS (multiple sclerosis) (HCC) 08/03/2013  . Spasticity 08/03/2013  . Obstructive sleep apnea 11/29/2012  . Sarcoidosis (HCC) 05/19/2012  . Paraplegia (HCC) 05/19/2012  . Multiple sclerosis exacerbation (HCC) 05/07/2012  . Fever 05/07/2012  . HTN (hypertension) 05/07/2012  . COPD (chronic obstructive pulmonary disease) (HCC) 05/07/2012  . Chronic pain 05/07/2012  . Polysubstance  abuse 05/07/2012  . Lower extremity pain 05/07/2012    Bonnee Quin, PT, DPT  12/01/2015, 5:21 PM  [place] [address] [place], [place], [address] Phone: [phone]   Fax:  [phone]  Name: Chad Vasquez MRN: [id number] Date of Birth: 03/30/1961

## 2015-12-11 ENCOUNTER — Ambulatory Visit (HOSPITAL_COMMUNITY): Payer: Medicare Other | Attending: Neurology

## 2015-12-11 ENCOUNTER — Encounter (HOSPITAL_COMMUNITY): Payer: Self-pay

## 2015-12-11 DIAGNOSIS — R2681 Unsteadiness on feet: Secondary | ICD-10-CM | POA: Insufficient documentation

## 2015-12-11 DIAGNOSIS — G729 Myopathy, unspecified: Secondary | ICD-10-CM | POA: Insufficient documentation

## 2015-12-11 DIAGNOSIS — Z7409 Other reduced mobility: Secondary | ICD-10-CM | POA: Insufficient documentation

## 2015-12-11 DIAGNOSIS — R29898 Other symptoms and signs involving the musculoskeletal system: Secondary | ICD-10-CM | POA: Insufficient documentation

## 2015-12-11 DIAGNOSIS — R269 Unspecified abnormalities of gait and mobility: Secondary | ICD-10-CM | POA: Insufficient documentation

## 2015-12-11 DIAGNOSIS — R262 Difficulty in walking, not elsewhere classified: Secondary | ICD-10-CM | POA: Diagnosis not present

## 2015-12-11 DIAGNOSIS — M6289 Other specified disorders of muscle: Secondary | ICD-10-CM

## 2015-12-11 NOTE — Therapy (Signed)
[facility] [address] [place], [place], [address] Phone: [phone]   Fax:  [phone]  Physical Therapy Treatment  Patient Details  Name: Chad Vasquez MRN: [id number] Date of Birth: [birth year] Referring Provider: Dr. Beryle Beams  Encounter Date: 12/11/2015      PT End of Session - 12/11/15 1333    Visit Number 2   Number of Visits 8   Date for PT Re-Evaluation 12/31/15   Authorization Type BCBS Medicare   Authorization Time Period 12/01/2015 to 01/26/2016   PT Start Time 1301   PT Stop Time 1345   PT Time Calculation (min) 44 min   Equipment Utilized During Treatment Gait belt   Activity Tolerance Patient tolerated treatment well;Patient limited by fatigue   Behavior During Therapy [facility] for tasks assessed/performed      Past Medical History  Diagnosis Date  . Multiple sclerosis (HCC) 2010  . HTN (hypertension)   . Polysubstance abuse   . Anxiety and depression   . Chronic pain   . Coronary artery disease   . Hyperlipidemia   . Sleep apnea     Past Surgical History  Procedure Laterality Date  . Mediastinoscopy  05/15/2012    Procedure: MEDIASTINOSCOPY;  Surgeon: Loreli Slot, MD;  Location: [facility] OR;  Service: Thoracic;  Laterality: N/A;  . Cholecystectomy N/A 04/16/2013    Procedure: LAPAROSCOPIC CHOLECYSTECTOMY;  Surgeon: Dalia Heading, MD;  Location: [facility]S;  Service: General;  Laterality: N/A;  . Cardiac catheterization  2005    There were no vitals filed for this visit.  Visit Diagnosis:  Impaired functional mobility and activity tolerance  Weakness of both lower extremities  Unsteadiness on feet  Abnormality of gait  Difficulty walking  Muscle tightness      Subjective Assessment - 12/11/15 1306    Subjective Pt noted that his B feet "are hurting today" due to increased swelling. B feet pain was rated a 5/10 on a VAS. He has a f/u appt with his MD on Monday to address his c/o B lower leg  edema. Pt denied falls since last PT visit. No new complaints reported upon arrival. Pt noted good compliance and tolerance with his current HEP.   Pertinent History MS, Sarcoidosis, SAD, HTN    Limitations Standing;Walking   How long can you sit comfortably? No limitations   How long can you stand comfortably? 1 minute    How long can you walk comfortably? ~15-20 feet with FWW    Diagnostic tests N/A    Patient Stated Goals Pt's goal is to improve his ability to stand and ambulate, and improve his strength.    Currently in Pain? Yes   Pain Score 5    Pain Location Leg  B feet and lower legs secondary to edema    Pain Orientation Right;Left   Pain Descriptors / Indicators Aching   Pain Type Chronic pain   Pain Onset More than a month ago   Pain Frequency Intermittent   Aggravating Factors  WB activities and dependent leg position    Pain Relieving Factors rest, elevation, and sitting    Effect of Pain on Daily Activities Limits the pt's ability to ambulate and stand for long periods of time    Multiple Pain Sites No                         [facility] Adult PT Treatment/Exercise - 12/11/15 0001  Transfers   Sit to Stand 5: Supervision   Number of Reps Other reps (comment)  6 Reps with focus on eccentric control; from/to W/C   Ambulation/Gait   Ambulation/Gait Yes   Ambulation/Gait Assistance 4: Min guard;Other (comment)   Ambulation Distance (Feet) 54 Feet   Assistive device Rolling walker   Gait Pattern Step-through pattern;Decreased dorsiflexion - right   Ambulation Surface Level;Indoor   Knee/Hip Exercises: Stretches   Passive Hamstring Stretch 30 seconds;4 reps;Other (comment)  manually    Gastroc Stretch 30 seconds;4 reps;Other (comment)  manually    Knee/Hip Exercises: Supine   Quad Sets Both;15 reps   Quad Sets Limitations 3 sec hold  Completed with B LE elevated    Heel Slides Both;1 set;10 reps;AAROM;AROM   Heel Slides Limitations Manual assist  required with R LE due to significant weakness   Other Supine Knee/Hip Exercises Ankle pumps x 10 reps  with manual assist required for R ankle  Completed with B LE elevated    Other Supine Knee/Hip Exercises Glute sets x 15 reps with 3 sec hold  Completed with B LE elevated    Manual Therapy   Manual Therapy Passive ROM   Passive ROM  hip, knee, and ankle PROM x 6 minutes, bilaterally                 PT Education - 12/11/15 1332    Education provided Yes   Education Details Educated pt on 5/5 fall precautions, LE elevation to manage edema, and updated HEP including glute and quad sets    Person(s) Educated Patient   Methods Explanation;Demonstration   Comprehension Verbalized understanding;Returned demonstration          PT Short Term Goals - 12/01/15 1647    PT SHORT TERM GOAL #1   Title Patient will demonstrate independence in beginning HEP, to affirm self-efficacy in work at home to making progress toward goals.   Time 3   Period Weeks   Status New   PT SHORT TERM GOAL #2   Title Patient will independently verbalize 5/5 fall precautions in order to reduce the risk for falls with household ambulation.   Time 2   Period Weeks   Status New   PT SHORT TERM GOAL #3   Title Patient will improve TUG time <45 seconds to demonstrate improved indep in functional mobility and to decrease risk of falls in the home.    Time 4   Period Weeks   Status New   PT SHORT TERM GOAL #4   Title Patient will independently demo improved eccentric control with stand to sit transfers in order to improve safety with transfers and reduce the risk for injury.    Time 4   Period Weeks   Status New           PT Long Term Goals - 12/01/15 1651    PT LONG TERM GOAL #1   Title Patient will demonstrate independence in advanced home exercise program by 1 week prior to discharge, to further self-efficacy in continuation of progress toward goals after discharge from therapy.    Time 8    Period Weeks   Status New   PT LONG TERM GOAL #2   Title Patient will ambulate 150' x 1 indoors on level terrain with the use of a FWW with improved heel to toe sequence and less severe B LE pain in oder to improve tolerance and safety with household ambulation.    Time 8  Period Weeks   Status New   PT LONG TERM GOAL #3   Title Patient will demo improved B LE strength by 1 MMT grade in order to improve performance with transfers and household ambulation.    Time 8   Period Weeks   Status New   PT LONG TERM GOAL #4   Title Patient will improve his 5 sit to stand time to < 25 seconds in order to improve tolerance with sit to stand transfers.    Time 8   Period Weeks   Status New   PT LONG TERM GOAL #5   Title Patient will improve B ankle DF AROM/PROM to 0-10 degrees in order to improve ankle mobility with ambulation and standing activities.    Time 8   Period Weeks   Status New               Plan - 12/11/15 1333    Clinical Impression Statement PT tx was focused on B LE PROM to improve mobility and reduce stiffness, B HS/gastroc stretches to improve posture in standing, supine basic strengthening ther ex to improve quad/glute strength, gait training with FWW to improve gait pattern and tolerance, and sit<>stand transfers with focus on eccentric control. Pt presented with significant B lower leg/feet edema upon arrival with compression stockings donned. Therapist instructed the pt to elevate B LE above his heart level and to complete ankle pumps in order to assist with edema. Pt is to see his MD on Monday, 12/15/2015, for further assessment of his B LE edema. Initiated PT tx with B LE PROM. Decreased hip mobility assessed, which improved once PROM was completed. Pt required manual assist with R LE ther ex secondary to profound weakness. Added quad sets, glute sets, and heel slides with good tolerance reported and assessed. Gait training with FWW was completed on level terrain with  focus on upright posture, increased ankle DF, and proper positioning of FWW. Pt was able to ambulate ~54' with CGA x 1 with a step-through gait pattern. Seated rest break required secondary to B LE fatigue. Ended PT tx with focus on sit <>stand transfers x 6 reps from W/C with focus on hand placement and eccentric control. Pt tolerated PT tx without exacerbating or worsening his pain. Continue with current POC.    Pt will benefit from skilled therapeutic intervention in order to improve on the following deficits Abnormal gait;Decreased strength;Difficulty walking;Decreased mobility;Decreased balance;Improper body mechanics;Decreased activity tolerance;Decreased endurance;Decreased coordination;Decreased range of motion;Impaired flexibility;Pain   Rehab Potential Fair   Clinical Impairments Affecting Rehab Potential Multiple sclerosis and Sarcoidosis.    PT Frequency 1x / week   PT Duration 8 weeks   PT Treatment/Interventions Therapeutic exercise;Balance training;Gait training;Patient/family education;Functional mobility training;Therapeutic activities;DME Instruction;ADLs/Self Care Home Management;Neuromuscular re-education;Manual techniques;Energy conservation;Passive range of motion   PT Next Visit Plan Next PT tx to focus on seated and supine LE strengthening ther ex, B LE PROM,  gait training with FWW, and static standing balance training    PT Home Exercise Plan Reviewed HEP with addition of quad and glute sets. Pt declined need for handout due to being familiar with provided ther ex.    Consulted and Agree with Plan of Care Patient        Problem List Patient Active Problem List   Diagnosis Date Noted  . Hyperlipidemia 08/13/2013  . Venous insufficiency 08/13/2013  . CAD (coronary artery disease) 08/13/2013  . MS (multiple sclerosis) (HCC) 08/03/2013  . Spasticity 08/03/2013  . Obstructive  sleep apnea 11/29/2012  . Sarcoidosis (HCC) 05/19/2012  . Paraplegia (HCC) 05/19/2012  .  Multiple sclerosis exacerbation (HCC) 05/07/2012  . Fever 05/07/2012  . HTN (hypertension) 05/07/2012  . COPD (chronic obstructive pulmonary disease) (HCC) 05/07/2012  . Chronic pain 05/07/2012  . Polysubstance abuse 05/07/2012  . Lower extremity pain 05/07/2012    Bonnee Quin, PT, DPT   12/11/2015, 3:59 PM  [place] [facility] [address] [place], [place], [address] Phone: [phone]   Fax:  [phone]  Name: Chad Vasquez MRN: [id number] Date of Birth: November 16, 1960

## 2015-12-15 DIAGNOSIS — E039 Hypothyroidism, unspecified: Secondary | ICD-10-CM | POA: Diagnosis not present

## 2015-12-15 DIAGNOSIS — E6609 Other obesity due to excess calories: Secondary | ICD-10-CM | POA: Diagnosis not present

## 2015-12-15 DIAGNOSIS — R609 Edema, unspecified: Secondary | ICD-10-CM | POA: Diagnosis not present

## 2015-12-15 DIAGNOSIS — Z6833 Body mass index (BMI) 33.0-33.9, adult: Secondary | ICD-10-CM | POA: Diagnosis not present

## 2015-12-15 DIAGNOSIS — G35 Multiple sclerosis: Secondary | ICD-10-CM | POA: Diagnosis not present

## 2015-12-17 ENCOUNTER — Telehealth (HOSPITAL_COMMUNITY): Payer: Self-pay | Admitting: Physical Therapy

## 2015-12-17 ENCOUNTER — Ambulatory Visit (HOSPITAL_COMMUNITY): Payer: Medicare Other | Admitting: Physical Therapy

## 2015-12-17 NOTE — Telephone Encounter (Signed)
Pt is feeling sick and want come in today

## 2015-12-23 ENCOUNTER — Encounter (HOSPITAL_COMMUNITY): Payer: Self-pay

## 2015-12-23 ENCOUNTER — Ambulatory Visit (HOSPITAL_COMMUNITY): Payer: Medicare Other

## 2015-12-23 DIAGNOSIS — R262 Difficulty in walking, not elsewhere classified: Secondary | ICD-10-CM

## 2015-12-23 DIAGNOSIS — M6289 Other specified disorders of muscle: Secondary | ICD-10-CM

## 2015-12-23 DIAGNOSIS — R748 Abnormal levels of other serum enzymes: Secondary | ICD-10-CM | POA: Diagnosis not present

## 2015-12-23 DIAGNOSIS — R269 Unspecified abnormalities of gait and mobility: Secondary | ICD-10-CM

## 2015-12-23 DIAGNOSIS — Z7409 Other reduced mobility: Secondary | ICD-10-CM | POA: Diagnosis not present

## 2015-12-23 DIAGNOSIS — R29898 Other symptoms and signs involving the musculoskeletal system: Secondary | ICD-10-CM

## 2015-12-23 DIAGNOSIS — R2681 Unsteadiness on feet: Secondary | ICD-10-CM

## 2015-12-23 NOTE — Therapy (Signed)
[facility] [address] [place], [place], [address] Phone: [phone]   Fax:  [phone]  Physical Therapy Treatment  Patient Details  Name: Chad Vasquez MRN: [id number] Date of Birth: June 24, 1961 Referring Provider: Dr. Beryle Beams  Encounter Date: 12/23/2015      PT End of Session - 12/23/15 1311    Visit Number 3   Number of Visits 8   Date for PT Re-Evaluation 12/31/15   Authorization Type BCBS Medicare   Authorization Time Period 12/01/2015 to 01/26/2016   PT Start Time 1301   PT Stop Time 1345   PT Time Calculation (min) 44 min   Equipment Utilized During Treatment Gait belt   Activity Tolerance Patient tolerated treatment well   Behavior During Therapy [facility] for tasks assessed/performed      Past Medical History  Diagnosis Date  . Multiple sclerosis (HCC) 2010  . HTN (hypertension)   . Polysubstance abuse   . Anxiety and depression   . Chronic pain   . Coronary artery disease   . Hyperlipidemia   . Sleep apnea     Past Surgical History  Procedure Laterality Date  . Mediastinoscopy  05/15/2012    Procedure: MEDIASTINOSCOPY;  Surgeon: Loreli Slot, MD;  Location: [facility] OR;  Service: Thoracic;  Laterality: N/A;  . Cholecystectomy N/A 04/16/2013    Procedure: LAPAROSCOPIC CHOLECYSTECTOMY;  Surgeon: Dalia Heading, MD;  Location: [facility] ORS;  Service: General;  Laterality: N/A;  . Cardiac catheterization  2005    There were no vitals filed for this visit.  Visit Diagnosis:  Impaired functional mobility and activity tolerance  Weakness of both lower extremities  Unsteadiness on feet  Abnormality of gait  Difficulty walking  Muscle tightness      Subjective Assessment - 12/23/15 1305    Subjective Pt c/o B LE stiffness and pain upon arrival. B LE pain was rated a 7/10 on a VAS, which extends from his hip down into his foot. Pt noted that he saw his PCP recently to address his c/o bilateral LE edema and  was told to make an appt with his neurologist for further assessment. Pt noted that his PCP believes that his current medications could be possible effecting his LE edema.    Pertinent History MS, Sarcoidosis, SAD, HTN    Limitations Standing;Walking   How long can you walk comfortably? ~30 feet with FWW    Diagnostic tests N/A    Patient Stated Goals Pt's goal is to improve his ability to stand and ambulate, and improve his strength.    Currently in Pain? Yes   Pain Score 7    Pain Location Leg  B feet    Pain Orientation Right;Left   Pain Descriptors / Indicators Aching   Pain Type Chronic pain   Pain Radiating Towards hip to foot, bilaterally   Pain Onset More than a month ago   Pain Frequency Intermittent   Aggravating Factors  WB activities  and dependent lower leg position    Pain Relieving Factors rest, elevation, and sitting    Effect of Pain on Daily Activities limits the pt's ability to ambulate and stand for long periods of time    Multiple Pain Sites No                         [facility] Adult PT Treatment/Exercise - 12/23/15 0001    Transfers   Sit to Stand 5: Supervision  Knee/Hip Exercises: Stretches   Passive Hamstring Stretch 30 seconds;4 reps;Other (comment)  manually    Piriformis Stretch Both;3 reps;30 seconds   Gastroc Stretch 30 seconds;4 reps;Other (comment)  manually    Knee/Hip Exercises: Seated   Ball Squeeze 1 set of 15 reps with 3 sec hold    Abduction/Adduction  Strengthening;Both;1 set;15 reps;Other (comment)  hip abduction with manual resist x 3 sec hold at end-range    Sit to Sand with UE support;Other (comment);1 set;10 reps   Knee/Hip Exercises: Supine   Quad Sets Both;15 reps   Heel Slides Both;1 set;10 reps;AAROM;AROM   Heel Slides Limitations Manual assist required with R LE due to significant weakness   Other Supine Knee/Hip Exercises Ankle pumps x 10 reps  with manual assist required for R ankle    Other Supine Knee/Hip  Exercises Glute sets x 15 reps with 3 sec hold    Manual Therapy   Manual Therapy Passive ROM   Passive ROM  hip, knee, and ankle PROM x 6 minutes, bilaterally                 PT Education - 12/23/15 1310    Education provided Yes   Education Details Educated pt on 5/5 fall precautions, LE elevation to manage edema, and current HEP    Person(s) Educated Patient   Methods Explanation;Demonstration;Verbal cues   Comprehension Verbalized understanding;Returned demonstration          PT Short Term Goals - 12/01/15 1647    PT SHORT TERM GOAL #1   Title Patient will demonstrate independence in beginning HEP, to affirm self-efficacy in work at home to making progress toward goals.   Time 3   Period Weeks   Status New   PT SHORT TERM GOAL #2   Title Patient will independently verbalize 5/5 fall precautions in order to reduce the risk for falls with household ambulation.   Time 2   Period Weeks   Status New   PT SHORT TERM GOAL #3   Title Patient will improve TUG time <45 seconds to demonstrate improved indep in functional mobility and to decrease risk of falls in the home.    Time 4   Period Weeks   Status New   PT SHORT TERM GOAL #4   Title Patient will independently demo improved eccentric control with stand to sit transfers in order to improve safety with transfers and reduce the risk for injury.    Time 4   Period Weeks   Status New           PT Long Term Goals - 12/01/15 1651    PT LONG TERM GOAL #1   Title Patient will demonstrate independence in advanced home exercise program by 1 week prior to discharge, to further self-efficacy in continuation of progress toward goals after discharge from therapy.    Time 8   Period Weeks   Status New   PT LONG TERM GOAL #2   Title Patient will ambulate 150' x 1 indoors on level terrain with the use of a FWW with improved heel to toe sequence and less severe B LE pain in oder to improve tolerance and safety with household  ambulation.    Time 8   Period Weeks   Status New   PT LONG TERM GOAL #3   Title Patient will demo improved B LE strength by 1 MMT grade in order to improve performance with transfers and household ambulation.    Time 8  Period Weeks   Status New   PT LONG TERM GOAL #4   Title Patient will improve his 5 sit to stand time to < 25 seconds in order to improve tolerance with sit to stand transfers.    Time 8   Period Weeks   Status New   PT LONG TERM GOAL #5   Title Patient will improve B ankle DF AROM/PROM to 0-10 degrees in order to improve ankle mobility with ambulation and standing activities.    Time 8   Period Weeks   Status New               Plan - 12/23/15 1311    Clinical Impression Statement PT tx was focused on B LE PROM to improve mobility and reduce stiffness, B HS/gastroc/piriformis stretches to improve posture in standing, supine/seated basic strengthening ther ex to improve quad/glute strength, and sit<>stand transfers with focus on eccentric control. Pt continues to present with significant B lower leg/feet. Therapist instructed and reminded the pt on the importance of elevating B LE above his heart level and to complete ankle pumps in order to assist with edema. Pt verbalized full understanding with provided instructions. Initiated PT tx with B LE PROM and manual LE stretches. Added piriformis stretch in supine to ther ex regimen. Pt reported less LE stiffness after PROM and stretches completed. Pt required manual assist with R LE ther ex secondary to profound weakness. Added seated hip abduction with manual resistance and hip adduction/ball squeeze to ther ex regimen with good tolerance reported. Ended PT tx with focus on sit <>stand transfers x 10 reps from low mat table with focus on hand placement and eccentric control. Pt tolerated PT tx without exacerbating or worsening his pain. Pt would benefit from additional reps/sets with seated ther ex at next visit. Pt would  benefit from continued skilled PT to address current deficits in order to improve his functional mobility, balance, mobility, and strength. Continue with current POC with focus on LE strengthening ther ex and gait training at next PT visit.     Pt will benefit from skilled therapeutic intervention in order to improve on the following deficits Abnormal gait;Decreased strength;Difficulty walking;Decreased mobility;Decreased balance;Improper body mechanics;Decreased activity tolerance;Decreased endurance;Decreased coordination;Decreased range of motion;Impaired flexibility;Pain   Rehab Potential Fair   Clinical Impairments Affecting Rehab Potential Multiple sclerosis and Sarcoidosis.    PT Frequency 1x / week   PT Duration 8 weeks   PT Treatment/Interventions Therapeutic exercise;Balance training;Gait training;Patient/family education;Functional mobility training;Therapeutic activities;DME Instruction;ADLs/Self Care Home Management;Neuromuscular re-education;Manual techniques;Energy conservation;Passive range of motion   PT Next Visit Plan Next PT tx to focus on seated and supine LE strengthening ther ex, B LE PROM,  gait training with FWW, and static standing balance training    PT Home Exercise Plan Reviewed HEP with no changes made this visit   Consulted and Agree with Plan of Care Patient        Problem List Patient Active Problem List   Diagnosis Date Noted  . Hyperlipidemia 08/13/2013  . Venous insufficiency 08/13/2013  . CAD (coronary artery disease) 08/13/2013  . MS (multiple sclerosis) (HCC) 08/03/2013  . Spasticity 08/03/2013  . Obstructive sleep apnea 11/29/2012  . Sarcoidosis (HCC) 05/19/2012  . Paraplegia (HCC) 05/19/2012  . Multiple sclerosis exacerbation (HCC) 05/07/2012  . Fever 05/07/2012  . HTN (hypertension) 05/07/2012  . COPD (chronic obstructive pulmonary disease) (HCC) 05/07/2012  . Chronic pain 05/07/2012  . Polysubstance abuse 05/07/2012  . Lower extremity pain  05/07/2012  Bonnee Quin, PT, DPT  12/23/2015, 7:08 PM  [facility] [address] [place], [place], [address] Phone: [phone]   Fax:  [phone]  Name: DANIAL HLAVAC MRN: [id number] Date of Birth: [birth year]

## 2015-12-27 DIAGNOSIS — G4733 Obstructive sleep apnea (adult) (pediatric): Secondary | ICD-10-CM | POA: Diagnosis not present

## 2015-12-30 ENCOUNTER — Ambulatory Visit (HOSPITAL_COMMUNITY): Payer: Medicare Other

## 2015-12-30 DIAGNOSIS — Z7409 Other reduced mobility: Secondary | ICD-10-CM

## 2015-12-30 DIAGNOSIS — M6289 Other specified disorders of muscle: Secondary | ICD-10-CM

## 2015-12-30 DIAGNOSIS — R269 Unspecified abnormalities of gait and mobility: Secondary | ICD-10-CM

## 2015-12-30 DIAGNOSIS — R29898 Other symptoms and signs involving the musculoskeletal system: Secondary | ICD-10-CM

## 2015-12-30 DIAGNOSIS — R2681 Unsteadiness on feet: Secondary | ICD-10-CM

## 2015-12-30 DIAGNOSIS — R262 Difficulty in walking, not elsewhere classified: Secondary | ICD-10-CM

## 2015-12-30 NOTE — Therapy (Signed)
[facility] [address] [place], [place], [address] Phone: [phone]   Fax:  [phone]  Physical Therapy Treatment ( 30-day Progress Note)  Patient Details  Name: CAROLYN MANISCALCO MRN: [id number] Date of Birth: 23-Jan-1961 Referring Provider: Dr. Beryle Beams  Encounter Date: 2016-01-28      PT End of Session - 01-28-2016 1307    Visit Number 4   Number of Visits 8   Date for PT Re-Evaluation 12/31/15   Authorization Type [employer] Medicare   Authorization Time Period 12/30/15 to 24-Feb-2016 ( G-codes updated on 01/28/2016 on 06-Mar-2023 PT visit)   PT Start Time 1302   PT Stop Time 1345   PT Time Calculation (min) 43 min   Equipment Utilized During Treatment Gait belt   Activity Tolerance Patient tolerated treatment well   Behavior During Therapy [facility] for tasks assessed/performed      Past Medical History  Diagnosis Date  . Multiple sclerosis (HCC) 2010  . HTN (hypertension)   . Polysubstance abuse   . Anxiety and depression   . Chronic pain   . Coronary artery disease   . Hyperlipidemia   . Sleep apnea     Past Surgical History  Procedure Laterality Date  . Mediastinoscopy  05/15/2012    Procedure: MEDIASTINOSCOPY;  Surgeon: Loreli Slot, MD;  Location: [facility] OR;  Service: Thoracic;  Laterality: N/A;  . Cholecystectomy N/A 04/16/2013    Procedure: LAPAROSCOPIC CHOLECYSTECTOMY;  Surgeon: Dalia Heading, MD;  Location: [facility];  Service: General;  Laterality: N/A;  . Cardiac catheterization  2005    There were no vitals filed for this visit.  Visit Diagnosis:  Impaired functional mobility and activity tolerance  Weakness of both lower extremities  Unsteadiness on feet  Difficulty walking  Muscle tightness  Abnormality of gait      Subjective Assessment - 01-28-2016 1308    Subjective Pt reported that his MS medication was discontinued on 12/26/15 by Dr. Gerilyn Pilgrim secondary to elevated liver enzymes. Pt noted that his leg  swelling and energy levels have improved since his medication was discontinued. Pt stated "there is some improvement" with less difficulty noted with sit<>stand transfers since beginning with PT services. Pt reported that he is still motivated to participate with PT services and feels as though "it's helping". Pt continues to c/o of R LE weakness, R calf cramping, and difficulty with long distance ambulation. He further noted that his B LE pain has been less lately, which he attributes to less swelling in B legs.    Pertinent History MS, Sarcoidosis, SAD, HTN    Limitations Standing;Walking   How long can you sit comfortably? No limitations   How long can you stand comfortably? 3 minutes    How long can you walk comfortably? ~50 feet with FWW    Diagnostic tests N/A    Patient Stated Goals Pt's goal is to improve his ability to stand and ambulate, and improve his strength.    Currently in Pain? Yes   Pain Score 2    Pain Location Leg   Pain Orientation Right;Left   Pain Descriptors / Indicators Aching   Pain Type Chronic pain   Pain Onset More than a month ago   Pain Frequency Intermittent   Aggravating Factors  WB activities and increased edema of B LE    Pain Relieving Factors rest, elevation, and sitting    Effect of Pain on Daily Activities limits his ability to ambulate long distances  [facility] PT Assessment - 12/30/15 0001    Assessment   Medical Diagnosis MS and unsteady gait   Referring Provider Dr. Beryle Beams   Onset Date/Surgical Date 11/05/15   Hand Dominance Left   Next MD Visit January 06, 2016    Prior Therapy --   Precautions   Precautions Fall   Restrictions   Weight Bearing Restrictions No   Home Environment   Living Environment Private residence   Living Arrangements Spouse/significant other   Available Help at Discharge Family;Friend(s)   Type of Home House   Home Access Level entry   Home Layout One level   Home Equipment Walker - 2 wheels;Cane  - single point;Crutches;Wheelchair - manual;Grab bars - toilet;Grab bars - tub/shower   Prior Function   Level of Independence Needs assistance with homemaking;Independent with basic ADLs;Independent with gait   Cognition   Overall Cognitive Status Within Functional Limits for tasks assessed   Observation/Other Assessments   Focus on Therapeutic Outcomes (FOTO)  55 % limited    Coordination   Fine Motor Movements are Fluid and Coordinated Yes   Posture/Postural Control   Posture/Postural Control Postural limitations   Postural Limitations Forward head;Rounded Shoulders;Increased thoracic kyphosis;Posterior pelvic tilt   AROM   Overall AROM Comments B UE AROM is WFL; Limitations assessed with bilateral ankle DF ROM due to passive insufficiency of gastroc-soleus   Right Ankle Dorsiflexion 4   Left Ankle Dorsiflexion 6   Strength   Right Hip Flexion 1/5   Right Hip ABduction 4-/5   Right Hip ADduction 4-/5   Left Hip Flexion 2+/5   Left Hip ABduction 4-/5   Left Hip ADduction 4-/5   Right Knee Flexion 3+/5   Right Knee Extension 3/5   Left Knee Flexion 4/5   Left Knee Extension 4/5   Right Ankle Dorsiflexion 3-/5   Left Ankle Dorsiflexion 4+/5   Flexibility   Soft Tissue Assessment /Muscle Length yes   Hamstrings Not formally assessed this visit    Transfers   Transfers Sit to Stand;Stand to Sit;Stand Pivot Transfers   Sit to Stand 6: Modified independent (Device/Increase time);With upper extremity assist   Five time sit to stand comments  17 sec with B UE assist required   Stand to Sit With upper extremity assist;6: Modified independent (Device/Increase time)   Stand Pivot Transfers 6: Modified independent (Device/Increase time)   Ambulation/Gait   Ambulation/Gait Yes   Ambulation/Gait Assistance 4: Min guard;Other (comment)  with gait belt donned   Ambulation Distance (Feet) 160 Feet  75 feet and 85 feet with rest break in between trials   Assistive device Rolling walker    Gait Pattern Step-through pattern;Decreased dorsiflexion - right;Decreased hip/knee flexion - right   Balance   Balance Assessed Yes   Static Sitting Balance   Static Sitting - Balance Support No upper extremity supported;Feet supported   Static Sitting - Level of Assistance 6: Modified independent (Device/Increase time)   Static Standing Balance   Static Standing - Balance Support No upper extremity supported   Static Standing - Level of Assistance 4: Min assist;5: Stand by assistance   Static Standing - Comment/# of Minutes eyes open=30 sec; Eyes closed=4 sec   Timed Up and Go Test   Normal TUG (seconds) 48   TUG Comments with FWW                      [facility] Adult PT Treatment/Exercise - 12/30/15 0001    Knee/Hip Exercises:  Stretches   Passive Hamstring Stretch 30 seconds;4 reps;Other (comment)  manually    Gastroc Stretch 30 seconds;4 reps;Other (comment)  manually    Knee/Hip Exercises: Standing   Gait Training Indoor gait training with FWW  for 75' x 1 and 85' x 1 with focus on heel to toe sequence, upright posture, and increased hip flexion. Rest break required secondary to generalized fatigue.     Knee/Hip Exercises: Seated   Sit to Sand with UE support;10 reps;3 sets   Knee/Hip Exercises: Supine   Heel Slides Both;1 set;10 reps;AAROM;AROM   Heel Slides Limitations Manual assist required with R LE due to significant weakness   Other Supine Knee/Hip Exercises Unilateral Modified leg press in supine with manual resistance x 2 sets of 10 reps with L LE and 1 set of 10 with R LE     Manual Therapy   Manual Therapy Passive ROM   Passive ROM  hip, knee, and ankle PROM x 3 minutes, bilaterally                 PT Education - 12/30/15 1832    Education provided Yes   Education Details Educated pt on 5/5 fall precautions, PT reassessment findings, and heel to toe gait pattern    Person(s) Educated Patient   Methods Explanation   Comprehension Verbalized  understanding;Returned demonstration;Verbal cues required          PT Short Term Goals - 12/30/15 1313    PT SHORT TERM GOAL #1   Title Patient will demonstrate independence in beginning HEP, to affirm self-efficacy in work at home to making progress toward goals.   Time 3   Period Weeks   Status On-going   PT SHORT TERM GOAL #2   Title Patient will independently verbalize 5/5 fall precautions in order to reduce the risk for falls with household ambulation.   Baseline Pt was able to recall 2/5 fall precautions    Time 2   Period Weeks   Status On-going   PT SHORT TERM GOAL #3   Title Patient will improve TUG time <45 seconds to demonstrate improved indep in functional mobility and to decrease risk of falls in the home.    Baseline 48 seconds with FWW    Time 4   Period Weeks   Status On-going   PT SHORT TERM GOAL #4   Title Patient will independently demo improved eccentric control with stand to sit transfers in order to improve safety with transfers and reduce the risk for injury.    Time 4   Period Weeks   Status Achieved           PT Long Term Goals - 12/30/15 1319    PT LONG TERM GOAL #1   Title Patient will demonstrate independence in advanced home exercise program by 1 week prior to discharge, to further self-efficacy in continuation of progress toward goals after discharge from therapy.    Time 8   Period Weeks   Status On-going   PT LONG TERM GOAL #2   Title Patient will ambulate 150' x 1 indoors on level terrain with the use of a FWW with improved heel to toe sequence and less severe B LE pain in oder to improve tolerance and safety with household ambulation.    Time 8   Period Weeks   Status On-going   PT LONG TERM GOAL #3   Title Patient will demo improved B LE strength by 1 MMT grade in order to  improve performance with transfers and household ambulation.    Time 8   Period Weeks   Status On-going   PT LONG TERM GOAL #4   Title Patient will improve his  5 sit to stand time to < 25 seconds in order to improve tolerance with sit to stand transfers.    Baseline 5 sit to stands= 17 seconds with B UE support    Time 8   Period Weeks   Status Achieved   PT LONG TERM GOAL #5   Title Patient will improve B ankle DF AROM/PROM to 0-10 degrees in order to improve ankle mobility with ambulation and standing activities.    Time 8   Period Weeks   Status On-going               Plan - 12/30/15 1834    Clinical Impression Statement Completed 30-day progress note this visit. Mr. Youngerman is a 55 yo male who has been seen for a total of 4 PT visits with initial c/o of B LE weakness, difficulty with transfers, difficulty with ambulation, and B LE pain. The pt has made steady progress towards his stated PT goals with improved activity tolerance, improved L quad/HS strength, improved independence/safety with transfers, decreased B LE pain, and improved gait pattern/tolerance. TUG time improved from 64 to 48 seconds with the use of a FWW and 5 sit to stand improved from 48 seconds to 17 seconds, indicating improved mobility and strength in B LE. The pt is able to complete sit <>stand transfers mod independent from a standard chair with good technique and eccentric control assessed with no cues required this visit. The pt has achieved 1/4 STGs and 1/4 LTGS. Current goals continue to be appropriate for desired outcome. Today's PT tx was focused on indoor gait training with the use of a FWW. Pt was able to ambulate with close supervision and W/C follow for 75' x 1 and 85' x 1 with seated rest break required between trials. Verbal cues provided to increase heel to toe sequence and to maintain upright posture. Pt demo improved tolerance with gait activities this visit compared to previous PT visits. Pt tolerated B LE PROM and manual resisted ther ex with minor fatigue reported at the end of PT tx. B LE pain remained at baseline levels at the end of PT tx. The pt continues  to present with limitations and impairments including B LE weakness ( R >L), difficulty with functional mobility activities, decreased endurance, and impaired static balance. The pt would benefit from continued skilled PT to address current deficits in order to progress towards improved quality of life and improve independence with functional mobility activities. The pt is happy with his progress and is in agreement with continued skilled PT.    Pt will benefit from skilled therapeutic intervention in order to improve on the following deficits Abnormal gait;Decreased strength;Difficulty walking;Decreased mobility;Decreased balance;Improper body mechanics;Decreased activity tolerance;Decreased endurance;Decreased coordination;Decreased range of motion;Impaired flexibility;Pain   Rehab Potential Fair   Clinical Impairments Affecting Rehab Potential Multiple sclerosis and Sarcoidosis.    PT Frequency 1x / week   PT Duration 8 weeks   PT Treatment/Interventions Therapeutic exercise;Balance training;Gait training;Patient/family education;Functional mobility training;Therapeutic activities;DME Instruction;ADLs/Self Care Home Management;Neuromuscular re-education;Manual techniques;Energy conservation;Passive range of motion   PT Next Visit Plan Next PT tx to focus on seated and supine LE strengthening ther ex, B LE PROM,  gait training with FWW, and static standing balance training    PT Home Exercise Plan Reviewed  HEP with no changes made this visit   Consulted and Agree with Plan of Care Patient          G-Codes - Jan 26, 2016 1849    Functional Assessment Tool Used Skilled clinical observation/assessment of functional mobility, strength, gait, balance, and functional activity tolerance    Functional Limitation Mobility: Walking and moving around   Mobility: Walking and Moving Around Current Status [phone]) At least 60 percent but less than 80 percent impaired, limited or restricted   Mobility: Walking and  Moving Around Goal Status [phone]) At least 40 percent but less than 60 percent impaired, limited or restricted      Problem List Patient Active Problem List   Diagnosis Date Noted  . Hyperlipidemia 08/13/2013  . Venous insufficiency 08/13/2013  . CAD (coronary artery disease) 08/13/2013  . MS (multiple sclerosis) (HCC) 08/03/2013  . Spasticity 08/03/2013  . Obstructive sleep apnea 11/29/2012  . Sarcoidosis (HCC) 05/19/2012  . Paraplegia (HCC) 05/19/2012  . Multiple sclerosis exacerbation (HCC) 05/07/2012  . Fever 05/07/2012  . HTN (hypertension) 05/07/2012  . COPD (chronic obstructive pulmonary disease) (HCC) 05/07/2012  . Chronic pain 05/07/2012  . Polysubstance abuse 05/07/2012  . Lower extremity pain 05/07/2012    Bonnee Quin, PT, DPT  26-Jan-2016, 6:53 PM  [facility] [address] [place], [place], [address] Phone: [phone]   Fax:  [phone]  Name: FURQAN GOSSELIN MRN: [id number] Date of Birth: November 25, 1960

## 2016-01-06 ENCOUNTER — Ambulatory Visit (HOSPITAL_COMMUNITY): Payer: Medicare Other | Attending: Neurology

## 2016-01-06 DIAGNOSIS — R29898 Other symptoms and signs involving the musculoskeletal system: Secondary | ICD-10-CM | POA: Diagnosis not present

## 2016-01-06 DIAGNOSIS — G729 Myopathy, unspecified: Secondary | ICD-10-CM | POA: Diagnosis not present

## 2016-01-06 DIAGNOSIS — M6281 Muscle weakness (generalized): Secondary | ICD-10-CM | POA: Diagnosis not present

## 2016-01-06 DIAGNOSIS — R2689 Other abnormalities of gait and mobility: Secondary | ICD-10-CM | POA: Insufficient documentation

## 2016-01-06 DIAGNOSIS — M6289 Other specified disorders of muscle: Secondary | ICD-10-CM

## 2016-01-06 DIAGNOSIS — R262 Difficulty in walking, not elsewhere classified: Secondary | ICD-10-CM | POA: Insufficient documentation

## 2016-01-06 DIAGNOSIS — Z7409 Other reduced mobility: Secondary | ICD-10-CM | POA: Diagnosis not present

## 2016-01-06 DIAGNOSIS — R269 Unspecified abnormalities of gait and mobility: Secondary | ICD-10-CM

## 2016-01-06 DIAGNOSIS — R2681 Unsteadiness on feet: Secondary | ICD-10-CM | POA: Diagnosis not present

## 2016-01-06 NOTE — Therapy (Signed)
[facility] [address] [place], [place], [address] Phone: [phone]   Fax:  [phone]  Physical Therapy Treatment  Patient Details  Name: Chad Vasquez MRN: [id number] Date of Birth: [birth year] Referring Provider: Dr. Beryle Beams  Encounter Date: 01/06/2016      PT End of Session - 01/06/16 1611    Visit Number 5   Number of Visits 8   Date for PT Re-Evaluation Jan 27, 2016   Authorization Type BCBS Medicare   Authorization Time Period 2015/12/02 to Jan 27, 2016 ( G-codes updated on 2015/12/31 on 2023/02/06 PT visit)   PT Start Time 1307   PT Stop Time 1347   PT Time Calculation (min) 40 min   Equipment Utilized During Treatment Gait belt   Activity Tolerance Patient tolerated treatment well   Behavior During Therapy [facility] for tasks assessed/performed      Past Medical History  Diagnosis Date  . Multiple sclerosis (HCC) 2010  . HTN (hypertension)   . Polysubstance abuse   . Anxiety and depression   . Chronic pain   . Coronary artery disease   . Hyperlipidemia   . Sleep apnea     Past Surgical History  Procedure Laterality Date  . Mediastinoscopy  05/15/2012    Procedure: MEDIASTINOSCOPY;  Surgeon: Loreli Slot, MD;  Location: [facility] OR;  Service: Thoracic;  Laterality: N/A;  . Cholecystectomy N/A 04/16/2013    Procedure: LAPAROSCOPIC CHOLECYSTECTOMY;  Surgeon: Dalia Heading, MD;  Location: [facility];  Service: General;  Laterality: N/A;  . Cardiac catheterization  2005    There were no vitals filed for this visit.  Visit Diagnosis:  Impaired functional mobility and activity tolerance  Weakness of both lower extremities  Unsteadiness on feet  Difficulty walking  Muscle tightness  Abnormality of gait      Subjective Assessment - 01/06/16 1307    Subjective Pt reported "my swelling has really gotten better since last week". Pt c/o minor B feet pain/burning upon arrival that was rated a 3/10 on a VAS. Pt denied falls  since last PT visit. Pt further noted reported " I feel like standing is getting easier".    Pertinent History MS, Sarcoidosis, SAD, HTN    Limitations Standing;Walking   How long can you sit comfortably? No limitations   How long can you stand comfortably? 3 minutes    How long can you walk comfortably? ~50 feet with FWW    Diagnostic tests N/A    Patient Stated Goals Pt's goal is to improve his ability to stand and ambulate, and improve his strength.    Currently in Pain? Yes   Pain Score 3    Pain Location Foot   Pain Orientation Right;Left   Pain Descriptors / Indicators Burning   Pain Type Chronic pain   Pain Onset More than a month ago   Pain Frequency Intermittent   Aggravating Factors  WB activities and increased edema of B LE    Pain Relieving Factors rest, elevation, and sitting    Effect of Pain on Daily Activities limits his ability to ambulate long distances and sleep uninterrupted                [facility] Adult PT Treatment/Exercise - 01/06/16 0001    Transfers   Transfers Sit to Stand;Stand to Sit;Stand Pivot Transfers   Sit to Stand 6: Modified independent (Device/Increase time);With upper extremity assist   Stand to Sit With upper extremity assist;6: Modified independent (Device/Increase time)  Stand Pivot Transfers 6: Modified independent (Device/Increase time)   Ambulation/Gait   Ambulation/Gait Yes   Ambulation/Gait Assistance 4: Min guard;Other (comment)  with gait belt donned   Ambulation Distance (Feet)  85 feet   Assistive device Rolling walker   Gait Pattern Step-through pattern;Decreased dorsiflexion - right;Decreased hip/knee flexion - right   Gait Comments Gait training completed on level terrain with focus on upright posture and heel to toe seq   Posture/Postural Control   Posture/Postural Control Postural limitations   Postural Limitations Forward head;Rounded Shoulders;Increased thoracic kyphosis;Posterior pelvic tilt   Knee/Hip Exercises:  Stretches   Passive Hamstring Stretch 30 seconds;4 reps;Other (comment)  manually    Piriformis Stretch Both;3 reps;30 seconds   Gastroc Stretch 30 seconds;4 reps;Other (comment)  manually    Knee/Hip Exercises: Seated   Sit to Sand with UE support;10 reps;2 sets   Knee/Hip Exercises: Supine   Short Arc Quad Sets Strengthening;Both;2 sets;10 reps   Heel Slides Both;10 reps;AAROM;AROM;2 sets   Heel Slides Limitations Manual assist required with R LE due to significant weakness   Other Supine Knee/Hip Exercises Unilateral Modified leg press in supine with moderate manual resistance x 2 sets of 15 reps with B LE    Manual Therapy   Manual Therapy Passive ROM   Passive ROM  hip, knee, and ankle PROM x 3 minutes, bilaterally                 PT Education - 01/06/16 1610    Education provided Yes   Education Details 5/5 fall precautions and current HEP    Person(s) Educated Patient   Methods Explanation;Demonstration   Comprehension Verbalized understanding;Returned demonstration          PT Short Term Goals - 12/30/15 1313    PT SHORT TERM GOAL #1   Title Patient will demonstrate independence in beginning HEP, to affirm self-efficacy in work at home to making progress toward goals.   Time 3   Period Weeks   Status On-going   PT SHORT TERM GOAL #2   Title Patient will independently verbalize 5/5 fall precautions in order to reduce the risk for falls with household ambulation.   Baseline Pt was able to recall 2/5 fall precautions    Time 2   Period Weeks   Status On-going   PT SHORT TERM GOAL #3   Title Patient will improve TUG time <45 seconds to demonstrate improved indep in functional mobility and to decrease risk of falls in the home.    Baseline 48 seconds with FWW    Time 4   Period Weeks   Status On-going   PT SHORT TERM GOAL #4   Title Patient will independently demo improved eccentric control with stand to sit transfers in order to improve safety with  transfers and reduce the risk for injury.    Time 4   Period Weeks   Status Achieved           PT Long Term Goals - 12/30/15 1319    PT LONG TERM GOAL #1   Title Patient will demonstrate independence in advanced home exercise program by 1 week prior to discharge, to further self-efficacy in continuation of progress toward goals after discharge from therapy.    Time 8   Period Weeks   Status On-going   PT LONG TERM GOAL #2   Title Patient will ambulate 150' x 1 indoors on level terrain with the use of a FWW with improved heel to toe sequence  and less severe B LE pain in oder to improve tolerance and safety with household ambulation.    Time 8   Period Weeks   Status On-going   PT LONG TERM GOAL #3   Title Patient will demo improved B LE strength by 1 MMT grade in order to improve performance with transfers and household ambulation.    Time 8   Period Weeks   Status On-going   PT LONG TERM GOAL #4   Title Patient will improve his 5 sit to stand time to < 25 seconds in order to improve tolerance with sit to stand transfers.    Baseline 5 sit to stands= 17 seconds with B UE support    Time 8   Period Weeks   Status Achieved   PT LONG TERM GOAL #5   Title Patient will improve B ankle DF AROM/PROM to 0-10 degrees in order to improve ankle mobility with ambulation and standing activities.    Time 8   Period Weeks   Status On-going               Plan - 01/06/16 1306    Clinical Impression Statement PT tx was focused on supine LE strengthening ther ex, B LE PROM, LE stretches, sit<>stand transfers, and gait training with FWW. Less severe edema assessed in B LE since last PT visit.  Added additional reps with modified leg press in supine with good tolerance reported with moderate manual resistance. Occasional brief rest breaks required between ther ex. Pt was able to demo good technique and safety awareness with sit<>stand transfers from W/C and low mat table with no cues  required this visit. Completed repeated sit<>stand transfers from low mat table with good tolerance reported. Progressed to gait training indoors on level terrain with the use of a FWW with CGA x 1 with focus on heel to toe sequence. Pt was able to tolerate ~85 feet before requiring a seated rest break. Improved heel to toe sequence demo this visit. Pt tolerated PT tx well with no adverse effects reported at the end of tx. Pt is responding well to PT tx with improved gait tolerance/pattern and improved ability to complete transfers with less difficulty. Continue with current POC with focus on static standing balance training, B LE strengthening, B LE PROM/stretches, and gait training.    Pt will benefit from skilled therapeutic intervention in order to improve on the following deficits Abnormal gait;Decreased strength;Difficulty walking;Decreased mobility;Decreased balance;Improper body mechanics;Decreased activity tolerance;Decreased endurance;Decreased coordination;Decreased range of motion;Impaired flexibility;Pain   Rehab Potential Fair   Clinical Impairments Affecting Rehab Potential Multiple sclerosis and Sarcoidosis.    PT Frequency 1x / week   PT Duration 8 weeks   PT Treatment/Interventions Therapeutic exercise;Balance training;Gait training;Patient/family education;Functional mobility training;Therapeutic activities;DME Instruction;ADLs/Self Care Home Management;Neuromuscular re-education;Manual techniques;Energy conservation;Passive range of motion   PT Next Visit Plan Next PT tx to focus on seated and supine LE strengthening ther ex, B LE PROM,  gait training with FWW, and static standing balance training    PT Home Exercise Plan Reviewed HEP with no changes made this visit   Consulted and Agree with Plan of Care Patient        Problem List Patient Active Problem List   Diagnosis Date Noted  . Hyperlipidemia 08/13/2013  . Venous insufficiency 08/13/2013  . CAD (coronary artery  disease) 08/13/2013  . MS (multiple sclerosis) (HCC) 08/03/2013  . Spasticity 08/03/2013  . Obstructive sleep apnea 11/29/2012  . Sarcoidosis (HCC) 05/19/2012  .  Paraplegia (HCC) 05/19/2012  . Multiple sclerosis exacerbation (HCC) 05/07/2012  . Fever 05/07/2012  . HTN (hypertension) 05/07/2012  . COPD (chronic obstructive pulmonary disease) (HCC) 05/07/2012  . Chronic pain 05/07/2012  . Polysubstance abuse 05/07/2012  . Lower extremity pain 05/07/2012    Bonnee Quin, PT, DPT   01/06/2016, 4:22 PM  [facility] [address] [place], [place], [address] Phone: [phone]   Fax:  [phone]  Name: Chad Vasquez MRN: [id number] Date of Birth: 05/05/1961

## 2016-01-07 DIAGNOSIS — G35 Multiple sclerosis: Secondary | ICD-10-CM | POA: Diagnosis not present

## 2016-01-07 DIAGNOSIS — M549 Dorsalgia, unspecified: Secondary | ICD-10-CM | POA: Diagnosis not present

## 2016-01-07 DIAGNOSIS — I10 Essential (primary) hypertension: Secondary | ICD-10-CM | POA: Diagnosis not present

## 2016-01-07 DIAGNOSIS — Z79899 Other long term (current) drug therapy: Secondary | ICD-10-CM | POA: Diagnosis not present

## 2016-01-07 DIAGNOSIS — K219 Gastro-esophageal reflux disease without esophagitis: Secondary | ICD-10-CM | POA: Diagnosis not present

## 2016-01-07 DIAGNOSIS — R5383 Other fatigue: Secondary | ICD-10-CM | POA: Diagnosis not present

## 2016-01-07 DIAGNOSIS — D860 Sarcoidosis of lung: Secondary | ICD-10-CM | POA: Diagnosis not present

## 2016-01-07 DIAGNOSIS — K769 Liver disease, unspecified: Secondary | ICD-10-CM | POA: Diagnosis not present

## 2016-01-07 DIAGNOSIS — R252 Cramp and spasm: Secondary | ICD-10-CM | POA: Diagnosis not present

## 2016-01-13 ENCOUNTER — Ambulatory Visit (HOSPITAL_COMMUNITY): Payer: Medicare Other

## 2016-01-13 ENCOUNTER — Encounter (HOSPITAL_COMMUNITY): Payer: Self-pay

## 2016-01-13 DIAGNOSIS — R262 Difficulty in walking, not elsewhere classified: Secondary | ICD-10-CM

## 2016-01-13 DIAGNOSIS — E538 Deficiency of other specified B group vitamins: Secondary | ICD-10-CM | POA: Diagnosis not present

## 2016-01-13 DIAGNOSIS — Z79899 Other long term (current) drug therapy: Secondary | ICD-10-CM | POA: Diagnosis not present

## 2016-01-13 DIAGNOSIS — M818 Other osteoporosis without current pathological fracture: Secondary | ICD-10-CM | POA: Diagnosis not present

## 2016-01-13 DIAGNOSIS — R2689 Other abnormalities of gait and mobility: Secondary | ICD-10-CM

## 2016-01-13 DIAGNOSIS — M6281 Muscle weakness (generalized): Secondary | ICD-10-CM

## 2016-01-13 DIAGNOSIS — R5383 Other fatigue: Secondary | ICD-10-CM | POA: Diagnosis not present

## 2016-01-13 DIAGNOSIS — E559 Vitamin D deficiency, unspecified: Secondary | ICD-10-CM | POA: Diagnosis not present

## 2016-01-13 DIAGNOSIS — Z7409 Other reduced mobility: Secondary | ICD-10-CM | POA: Diagnosis not present

## 2016-01-13 DIAGNOSIS — R2681 Unsteadiness on feet: Secondary | ICD-10-CM

## 2016-01-13 NOTE — Patient Instructions (Signed)
   BRIDGING WITH PILLOW SQUEEZE  While lying on your back, place a pillow between your knees and squeeze the pillow. Hold this and then tighten your lower abdominals, squeeze your buttocks and raise your buttocks off the floor/bed as creating a "Bridge" with your body.  Complete 2 sets of 10 reps and complete 2 sessions per day,  4x/week

## 2016-01-13 NOTE — Therapy (Addendum)
[place], [place], [address] Phone: [phone]   Fax:  [phone]  Physical Therapy Treatment  Patient Details  Name: Chad Vasquez MRN: [id number] Date of Birth: 06/06/1961 Referring Provider: Dr. Phillips Odor  Encounter Date: 01/13/2016      PT End of Session - 01/13/16 1333    Visit Number 5   Number of Visits 8   Date for PT Re-Evaluation 02-22-16   Authorization Type BCBS Medicare   Authorization Time Period 2015/12/28 to 2016-02-22 ( G-codes updated on January 26, 2016 on January 02, 2023 PT visit)   PT Start Time 01/02/32   PT Stop Time 1428   PT Time Calculation (min) 55 min   Equipment Utilized During Treatment Gait belt   Activity Tolerance Patient tolerated treatment well   Behavior During Therapy [facility] for tasks assessed/performed      Past Medical History  Diagnosis Date  . Multiple sclerosis ([place]) 2009-01-01  . HTN (hypertension)   . Polysubstance abuse   . Anxiety and depression   . Chronic pain   . Coronary artery disease   . Hyperlipidemia   . Sleep apnea     Past Surgical History  Procedure Laterality Date  . Mediastinoscopy  05/15/2012    Procedure: MEDIASTINOSCOPY;  Surgeon: Melrose Nakayama, MD;  Location: [place];  Service: Thoracic;  Laterality: N/A;  . Cholecystectomy N/A 04/16/2013    Procedure: LAPAROSCOPIC CHOLECYSTECTOMY;  Surgeon: Jamesetta So, MD;  Location: [facility];  Service: General;  Laterality: N/A;  . Cardiac catheterization  2004/01/02    There were no vitals filed for this visit.      Subjective Assessment - 01/13/16 1339    Subjective Patient reports that his swelling continues to improve and that his pain levels are less severe as a result. In addition, he reports that sleeping has improved with less frequent interrupted sleep.    Pertinent History MS, Sarcoidosis, SAD, HTN    Limitations Standing;Walking   Patient Stated Goals Pt's goal is to improve his ability to stand and ambulate,  and improve his strength.    Currently in Pain? Yes   Pain Score 3    Pain Location Foot   Pain Orientation Left;Right   Pain Descriptors / Indicators Burning   Pain Type Chronic pain   Pain Onset More than a month ago   Pain Frequency Intermittent   Aggravating Factors  WB activities and increased edema of B LE    Pain Relieving Factors rest, elevation, and sitting    Effect of Pain on Daily Activities limits his ability to ambulate long distances             [facility] Adult PT Treatment/Exercise - 01/13/16 0001    Ambulation/Gait   Gait Comments Gait training completed on level terrain with focus on upright posture and heel to toe seq   Knee/Hip Exercises: Stretches   Passive Hamstring Stretch 30 seconds;4 reps;Other (comment)   Piriformis Stretch Both;3 reps;30 seconds   Gastroc Stretch 30 seconds;4 reps;Other (comment)   Knee/Hip Exercises: Standing   Gait Training Indoor gait training with FWW for 103' x 1,  01/02/2071' x 1, and 40' x 1 with focus on heel to toe sequence, upright posture, and increased R hip/knee flexion during swing phase. Rest break required secondary to generalized fatigue.     Forward/backward steps between parallel bars with UE support and close supervision x 5 rounds   Knee/Hip Exercises: Seated   Sit to [employer]  with UE support;10 reps;2 sets   Knee/Hip Exercises: Supine   Bridges Strengthening;Both;2 sets;10 reps   Bridges Limitations assist required to keep knees together    Other Supine Knee/Hip Exercises Unilateral Modified leg press in supine with moderate manual resistance x 2 sets of 15 reps with B LE    Manual Therapy   Manual Therapy Passive ROM   Passive ROM  hip, knee, and ankle PROM x 5 minutes, bilaterally                 PT Education - 01/13/16 1703    Education provided Yes   Education Details 5/5 fall precautions and current/updated HEP    Person(s) Educated Patient   Methods Explanation;Demonstration;Handout   Comprehension  Verbalized understanding;Returned demonstration          PT Short Term Goals - 12/30/15 1313    PT SHORT TERM GOAL #1   Title Patient will demonstrate independence in beginning HEP, to affirm self-efficacy in work at home to making progress toward goals.   Time 3   Period Weeks   Status On-going   PT SHORT TERM GOAL #2   Title Patient will independently verbalize 5/5 fall precautions in order to reduce the risk for falls with household ambulation.   Baseline Pt was able to recall 2/5 fall precautions    Time 2   Period Weeks   Status On-going   PT SHORT TERM GOAL #3   Title Patient will improve TUG time <45 seconds to demonstrate improved indep in functional mobility and to decrease risk of falls in the home.    Baseline 48 seconds with FWW    Time 4   Period Weeks   Status On-going   PT SHORT TERM GOAL #4   Title Patient will independently demo improved eccentric control with stand to sit transfers in order to improve safety with transfers and reduce the risk for injury.    Time 4   Period Weeks   Status Achieved           PT Long Term Goals - 12/30/15 1319    PT LONG TERM GOAL #1   Title Patient will demonstrate independence in advanced home exercise program by 1 week prior to discharge, to further self-efficacy in continuation of progress toward goals after discharge from therapy.    Time 8   Period Weeks   Status On-going   PT LONG TERM GOAL #2   Title Patient will ambulate 150' x 1 indoors on level terrain with the use of a FWW with improved heel to toe sequence and less severe B LE pain in oder to improve tolerance and safety with household ambulation.    Time 8   Period Weeks   Status On-going   PT LONG TERM GOAL #3   Title Patient will demo improved B LE strength by 1 MMT grade in order to improve performance with transfers and household ambulation.    Time 8   Period Weeks   Status On-going   PT LONG TERM GOAL #4   Title Patient will improve his 5 sit to  stand time to < 25 seconds in order to improve tolerance with sit to stand transfers.    Baseline 5 sit to stands= 17 seconds with B UE support    Time 8   Period Weeks   Status Achieved   PT LONG TERM GOAL #5   Title Patient will improve B ankle DF AROM/PROM to 0-10 degrees in order  to improve ankle mobility with ambulation and standing activities.    Time 8   Period Weeks   Status On-going               Plan - 01/13/16 1334    Clinical Impression Statement PT tx was focused on B LE PROM, LE stretches, supine LE strengthening ther ex, dynamic balance training, and gait training with FWW. Added bridges to ther ex regimen and HEP with good tolerance reported. Updated HEP handout was provided this visit with good understanding demo by pt with teach back method. Occasional brief rest breaks required between ther ex. Progressed to gait training indoors on level terrain with the use of a FWW with close supervision to CGA x 1 with focus on heel to toe sequence, upright posture, and increased R hip/knee flexion during swing phase. Improved endurance assessed this visit with gait training activity. Pt was able to ambulate for 103' x 1, 72' x 1, and 40' x 1. Rest break required secondary to generalized fatigue. Added forward and backward steps between parallel bars in order to improve weight shifting and balance with standing ADLs. Pt tolerated PT tx well with no adverse effects reported at the end of tx. B feet pain remained at baseline level at the end of PT tx. Pt is responding well to PT tx with improved gait tolerance/pattern. Continue with current POC with focus on static standing balance training, B LE strengthening, B LE PROM/stretches, and gait training.     Rehab Potential Fair   Clinical Impairments Affecting Rehab Potential Multiple sclerosis and Sarcoidosis.    PT Frequency 1x / week   PT Duration 3 weeks   PT Treatment/Interventions Therapeutic exercise;Balance training;Gait  training;Patient/family education;Functional mobility training;Therapeutic activities;DME Instruction;ADLs/Self Care Home Management;Neuromuscular re-education;Manual techniques;Energy conservation;Passive range of motion   PT Next Visit Plan Next PT tx to focus on seated and supine LE strengthening ther ex, B LE PROM,  gait training with FWW, and static standing balance training    PT Home Exercise Plan Reviewed HEP with no changes made this visit   Consulted and Agree with Plan of Care Patient    [phone]  CL:  Unable to truly determine as pt did not return.   Patient will benefit from skilled therapeutic intervention in order to improve the following deficits and impairments:  Abnormal gait, Decreased strength, Difficulty walking, Decreased mobility, Decreased balance, Improper body mechanics, Decreased activity tolerance, Decreased endurance, Decreased coordination, Decreased range of motion, Impaired flexibility, Pain  Visit Diagnosis: Muscle weakness (generalized) - Plan: PT plan of care cert/re-cert  Unsteadiness on feet - Plan: PT plan of care cert/re-cert  Difficulty in walking, not elsewhere classified - Plan: PT plan of care cert/re-cert  Other abnormalities of gait and mobility - Plan: PT plan of care cert/re-cert     Problem List Patient Active Problem List   Diagnosis Date Noted  . Hyperlipidemia 08/13/2013  . Venous insufficiency 08/13/2013  . CAD (coronary artery disease) 08/13/2013  . MS (multiple sclerosis) ([place]) 08/03/2013  . Spasticity 08/03/2013  . Obstructive sleep apnea 11/29/2012  . Sarcoidosis ([place]) 05/19/2012  . Paraplegia ([place]) 05/19/2012  . Multiple sclerosis exacerbation ([place]) 05/07/2012  . Fever 05/07/2012  . HTN (hypertension) 05/07/2012  . COPD (chronic obstructive pulmonary disease) ([place]) 05/07/2012  . Chronic pain 05/07/2012  . Polysubstance abuse 05/07/2012  . Lower extremity pain 05/07/2012    Garen Lah, PT, DPT  01/13/2016, 5:19  PM  [facility]  [place], [place], [address] Phone: [phone]   Fax:  [phone]  Name: Chad Vasquez MRN: [id number] Date of Birth: 05/16/1961  PHYSICAL THERAPY DISCHARGE SUMMARY  Visits from Start of Care: 5 Current functional level related to goals / functional outcomes: See above   Remaining deficits: Unknown as pt did not return for follow up visit   Education / Equipment: given Plan: Patient agrees to discharge.  Patient goals were partially met. Patient is being discharged due to not returning since the last visit.  ?????      Rayetta Humphrey, [place] CLT [phone]

## 2016-01-19 ENCOUNTER — Encounter (HOSPITAL_COMMUNITY): Payer: Medicare Other | Admitting: Physical Therapy

## 2016-01-19 ENCOUNTER — Encounter: Payer: Self-pay | Admitting: Internal Medicine

## 2016-02-03 ENCOUNTER — Encounter: Payer: Self-pay | Admitting: Gastroenterology

## 2016-02-03 ENCOUNTER — Ambulatory Visit (INDEPENDENT_AMBULATORY_CARE_PROVIDER_SITE_OTHER): Payer: Medicare Other | Admitting: Gastroenterology

## 2016-02-03 VITALS — BP 148/94 | HR 90 | Temp 98.4°F | Ht 75.0 in | Wt 270.0 lb

## 2016-02-03 DIAGNOSIS — R799 Abnormal finding of blood chemistry, unspecified: Secondary | ICD-10-CM | POA: Diagnosis not present

## 2016-02-03 DIAGNOSIS — R7989 Other specified abnormal findings of blood chemistry: Secondary | ICD-10-CM | POA: Diagnosis not present

## 2016-02-03 DIAGNOSIS — R1314 Dysphagia, pharyngoesophageal phase: Secondary | ICD-10-CM

## 2016-02-03 DIAGNOSIS — R945 Abnormal results of liver function studies: Secondary | ICD-10-CM

## 2016-02-03 DIAGNOSIS — R1319 Other dysphagia: Secondary | ICD-10-CM

## 2016-02-03 DIAGNOSIS — R1310 Dysphagia, unspecified: Secondary | ICD-10-CM | POA: Insufficient documentation

## 2016-02-03 LAB — HEPATIC FUNCTION PANEL
ALT: 104 U/L — ABNORMAL HIGH (ref 9–46)
AST: 43 U/L — ABNORMAL HIGH (ref 10–35)
Albumin: 4.2 g/dL (ref 3.6–5.1)
Alkaline Phosphatase: 47 U/L (ref 40–115)
BILIRUBIN DIRECT: 0.1 mg/dL (ref <=0.2)
BILIRUBIN INDIRECT: 0.2 mg/dL (ref 0.2–1.2)
BILIRUBIN TOTAL: 0.3 mg/dL (ref 0.2–1.2)
Total Protein: 5.9 g/dL — ABNORMAL LOW (ref 6.1–8.1)

## 2016-02-03 LAB — CBC WITH DIFFERENTIAL/PLATELET
BASOS PCT: 1 %
Basophils Absolute: 111 cells/uL (ref 0–200)
Eosinophils Absolute: 0 cells/uL — ABNORMAL LOW (ref 15–500)
Eosinophils Relative: 0 %
HCT: 38.5 % (ref 38.5–50.0)
Hemoglobin: 13 g/dL — ABNORMAL LOW (ref 13.2–17.1)
LYMPHS PCT: 10 %
Lymphs Abs: 1110 cells/uL (ref 850–3900)
MCH: 31.3 pg (ref 27.0–33.0)
MCHC: 33.8 g/dL (ref 32.0–36.0)
MCV: 92.5 fL (ref 80.0–100.0)
MONOS PCT: 7 %
MPV: 11.1 fL (ref 7.5–12.5)
Monocytes Absolute: 777 cells/uL (ref 200–950)
NEUTROS ABS: 9102 {cells}/uL — AB (ref 1500–7800)
Neutrophils Relative %: 82 %
PLATELETS: 218 10*3/uL (ref 140–400)
RBC: 4.16 MIL/uL — AB (ref 4.20–5.80)
RDW: 14.3 % (ref 11.0–15.0)
WBC: 11.1 10*3/uL — ABNORMAL HIGH (ref 3.8–10.8)

## 2016-02-03 LAB — T4, FREE: FREE T4: 1.2 ng/dL (ref 0.8–1.8)

## 2016-02-03 LAB — TSH: TSH: 0.76 mIU/L (ref 0.40–4.50)

## 2016-02-03 LAB — IRON AND TIBC
%SAT: 29 % (ref 15–60)
Iron: 80 ug/dL (ref 50–180)
TIBC: 273 ug/dL (ref 250–425)
UIBC: 193 ug/dL (ref 125–400)

## 2016-02-03 LAB — FERRITIN: Ferritin: 838 ng/mL — ABNORMAL HIGH (ref 20–380)

## 2016-02-03 NOTE — Progress Notes (Signed)
Primary Care Physician:  Colette Ribas, MD  Primary Gastroenterologist:  Roetta Sessions, MD Referring provider: Dr. Gerilyn Pilgrim  Chief Complaint  Patient presents with  . Elevated Hepatic Enzymes    HPI:  Chad Vasquez is a 55 y.o. male here at the request of Dr. Gerilyn Pilgrim for further evaluation of abnormal LFTs.   Patient reports diagnosis of "probable" MS back in 2010. At the time he had asymmetric lower extremity weakness. He states that the workup was inconclusive but probable MS. He doubts the diagnosis. He reports in 2013 he presented with high fever, severe pain, and developed inability to fall. Noted to have abnormality on chest x-ray, biopsy with diagnosis of sarcoidosis at that time. States he had 3 attempts for spinal tap during the 2013 episode and he feels that is why he is no longer able to ambulate.  Patient has been on a couple different things for MS in the past. Also on quite a bit of prednisone and/or Solu-Medrol. At one point his weight got up to 299 pounds. He has had issues with swelling. Has been on Aubagio for about 18 months for his MS. Recently stopped back in March due to elevated LFTs. Abnormal LFTs first noted by PCP apparently. Back in 2013 when he was acutely ill he did have elevation of AST and ALT at that time it unclear if this was his baseline or during acute illness.   Patient denies abdominal pain. He has some constipation. No melena rectal bleeding. Feels like solid food gets stuck in the esophagus and causes pain. When this occurs she can get anything down. This is been going on for "quite a while". No report of melena rectal bleeding. No heartburn or vomiting.  From PCP notes the following labs 10/19/2015: AST/ALT = 29/56 12/15/2015: AST/ALT = 40/118 12/23/2015: AST/ALT =l 43/108  Current Outpatient Prescriptions  Medication Sig Dispense Refill  . baclofen (LIORESAL) 20 MG tablet Take 1 tablet (20 mg total) by mouth 3 (three) times daily. 90 each 12   . calcium carbonate (TUMS - DOSED IN MG ELEMENTAL CALCIUM) 500 MG chewable tablet Chew 1 tablet by mouth daily as needed for indigestion or heartburn. Reported on 02/03/2016    . diazepam (VALIUM) 5 MG tablet Take 10 mg by mouth at bedtime.     . docusate sodium (COLACE) 100 MG capsule Take 200 mg by mouth at bedtime. Reported on 02/03/2016    . furosemide (LASIX) 20 MG tablet Take 20 mg by mouth as needed.    . furosemide (LASIX) 40 MG tablet Take 40 mg by mouth as needed.    Marland Kitchen losartan (COZAAR) 100 MG tablet Take 1 tablet by mouth every morning.     . polyethylene glycol powder (GLYCOLAX/MIRALAX) powder     . predniSONE (DELTASONE) 20 MG tablet Take 20 mg by mouth 3 (three) times daily.    Marland Kitchen PROAIR HFA 108 (90 BASE) MCG/ACT inhaler INHALE TWO PUFFS INTO THE LUNGS EVERY SIX HOURS AS NEEDED FOR WHEEZING 8.5 g 3  . traMADol (ULTRAM) 50 MG tablet Take 50 mg by mouth 2 (two) times daily.    . Vitamin D, Ergocalciferol, (DRISDOL) 50000 UNITS CAPS capsule Take 50,000 Units by mouth every 7 (seven) days. Takes on Wedneday    . pantoprazole (PROTONIX) 40 MG tablet Take 40 mg by mouth daily as needed (for acid reflux/indigestion).      No current facility-administered medications for this visit.    Allergies as of 02/03/2016  . (No  Known Allergies)    Past Medical History  Diagnosis Date  . Multiple sclerosis (HCC) 2010  . HTN (hypertension)   . Polysubstance abuse 2013  . Anxiety and depression   . Chronic pain   . Coronary artery disease   . Hyperlipidemia   . Sleep apnea     CPAP  . Sarcoidosis (HCC)     2013, ???    Past Surgical History  Procedure Laterality Date  . Mediastinoscopy  05/15/2012    Procedure: MEDIASTINOSCOPY;  Surgeon: Loreli Slot, MD;  Location: [facility] OR;  Service: Thoracic;  Laterality: N/A;  . Cholecystectomy N/A 04/16/2013    Procedure: LAPAROSCOPIC CHOLECYSTECTOMY;  Surgeon: Dalia Heading, MD;  Location: [facility] ORS;  Service: General;  Laterality: N/A;  .  Cardiac catheterization  2005  . Colonoscopy  2011    Dr. Jena Gauss: normal    Family History  Problem Relation Age of Onset  . Heart failure Mother   . Stroke Mother   . Heart failure Father   . Breast cancer Sister   . Seizures Brother   . Liver disease Neg Hx   . Breast cancer Sister   . Colon cancer Neg Hx     Social History   Social History  . Marital Status: Single    Spouse Name: N/A  . Number of Children: 0  . Years of Education: HS   Occupational History  . auto technician      disabled    Social History Main Topics  . Smoking status: Former Smoker -- 1.00 packs/day for 30 years    Types: Cigarettes    Quit date: 05/01/2012  . Smokeless tobacco: Never Used  . Alcohol Use: No     Comment: quit 05/01/2012  . Drug Use: Yes    Special: Cocaine, Marijuana     Comment: 05/01/2012 last use  . Sexual Activity: No   Other Topics Concern  . Not on file   Social History Narrative   Patient lives at home with spouse.   Caffeine Use: 3 cups daily      ROS:  General: Negative for anorexia, weight loss, fever, chills, fatigue, weakness. Eyes: Negative for vision changes.  ENT: Negative for hoarseness, difficulty swallowing , nasal congestion. CV: Negative for chest pain, angina, palpitations, dyspnea on exertion, peripheral edema.  Respiratory: Negative for dyspnea at rest, dyspnea on exertion, cough, sputum, wheezing.  GI: See history of present illness. GU:  Negative for dysuria, hematuria, urinary incontinence, urinary frequency, nocturnal urination.  MS: Negative for joint pain, low back pain.  Derm: Negative for rash or itching.  Neuro: Negative for weakness, abnormal sensation, seizure, frequent headaches, memory loss, confusion. Chronic lower ext weakness Psych: Negative for anxiety, depression, suicidal ideation, hallucinations.  Endo: Negative for unusual weight change.  Heme: Negative for bruising or bleeding. Allergy: Negative for rash or hives.     Physical Examination:  BP 148/94 mmHg  Pulse 90  Temp(Src) 98.4 F (36.9 C) (Oral)  Ht 6\' 3"  (1.905 m)  Wt 270 lb (122.471 kg)  BMI 33.75 kg/m2   General: Well-nourished, well-developed in no acute distress. In wheelchair. Wife present Head: Normocephalic, atraumatic.   Eyes: Conjunctiva pink, no icterus. Mouth: Oropharyngeal mucosa moist and pink , no lesions erythema or exudate. Neck: Supple without thyromegaly, masses, or lymphadenopathy.  Lungs: Clear to auscultation bilaterally.  Heart: Regular rate and rhythm, no murmurs rubs or gallops.  Abdomen: Bowel sounds are normal, nontender, nondistended, no hepatosplenomegaly or masses, no  abdominal bruits or    hernia , no rebound or guarding.  Difficult to exam due to body habitus and in wheelchair Rectal: deferred Extremities: No lower extremity edema. No clubbing or deformities.  Neuro: Alert and oriented x 4 , grossly normal neurologically.  Skin: Warm and dry, no rash or jaundice.   Psych: Alert and cooperative, normal mood and affect.  Labs: April 2017 outside labs White blood cell count 10,700, hemoglobin 12.9, hematocrit 38.7, platelets 235,000, sodium 141, potassium 4.6, BUN 12, creatinine 0.86, total bilirubin 0.4, alkaline phosphatase 47, AST 49, ALT 123, albumin 4.  12/23/2015 outside labs AST 43, ALT 108  12/15/2015 outside labs AST 40, ALT 118, TSH 0.006 low  Imaging Studies: No results found.

## 2016-02-03 NOTE — Patient Instructions (Signed)
1. Please have your labs and ultrasound done.  2. We will contact you with results and further recommendations.  3. We will plan on upper endoscopy in the upcoming month. We will call to schedule.

## 2016-02-04 LAB — PROTIME-INR
INR: 0.93 (ref <1.50)
PROTHROMBIN TIME: 12.6 s (ref 11.6–15.2)

## 2016-02-04 LAB — IGG, IGA, IGM
IGG (IMMUNOGLOBIN G), SERUM: 384 mg/dL — AB (ref 650–1600)
IGM, SERUM: 47 mg/dL (ref 41–251)
IgA: 85 mg/dL (ref 68–379)

## 2016-02-04 LAB — HEPATITIS B SURFACE ANTIGEN: Hepatitis B Surface Ag: NEGATIVE

## 2016-02-04 LAB — ANA: Anti Nuclear Antibody(ANA): NEGATIVE

## 2016-02-04 LAB — HEPATITIS C ANTIBODY: HCV Ab: NEGATIVE

## 2016-02-05 LAB — CERULOPLASMIN: Ceruloplasmin: 22 mg/dL (ref 18–36)

## 2016-02-06 ENCOUNTER — Ambulatory Visit (HOSPITAL_COMMUNITY)
Admission: RE | Admit: 2016-02-06 | Discharge: 2016-02-06 | Disposition: A | Discharge status: Home / Self Care | Payer: Medicare Other | Source: Ambulatory Visit | Attending: Gastroenterology | Admitting: Gastroenterology

## 2016-02-06 DIAGNOSIS — R7989 Other specified abnormal findings of blood chemistry: Secondary | ICD-10-CM | POA: Insufficient documentation

## 2016-02-06 DIAGNOSIS — R945 Abnormal results of liver function studies: Secondary | ICD-10-CM | POA: Diagnosis not present

## 2016-02-06 DIAGNOSIS — N281 Cyst of kidney, acquired: Secondary | ICD-10-CM | POA: Diagnosis not present

## 2016-02-06 DIAGNOSIS — R1310 Dysphagia, unspecified: Secondary | ICD-10-CM

## 2016-02-06 DIAGNOSIS — R1314 Dysphagia, pharyngoesophageal phase: Secondary | ICD-10-CM | POA: Insufficient documentation

## 2016-02-06 DIAGNOSIS — R1319 Other dysphagia: Secondary | ICD-10-CM

## 2016-02-06 LAB — MITOCHONDRIAL/SMOOTH MUSCLE AB PNL: Smooth Muscle Ab: <20 U (ref <20)

## 2016-02-06 NOTE — Assessment & Plan Note (Signed)
55 year old gentleman with complicated past medical history including MS, sarcoidosis has been on chronic prednisone therapy and recently came off of Aubagio for abnormal LFTs. While drug induce hepatotoxicity has not been excluded it is notable that his AST/ALT have worsened off of Aubagio. Suspect he does have fatty liver based on obesity. We need to exclude other etiologies such as viral hepatitis, autoimmune hepatitis, Wilson's, hemochromatosis, or hepatic sarcoidosis . Plan on labs, abdominal ultrasound. Other recommendations to follow.

## 2016-02-06 NOTE — Assessment & Plan Note (Signed)
Solid food esophageal dysphagia. Plan on EGD with dilation in the near future. Patient has an important appointment with MS clinic at [facility] for second opinion coming up. We will arrange for his EGD to be done in about 3 weeks. I have discussed the risks, alternatives, benefits with regards to but not limited to the risk of reaction to medication, bleeding, infection, perforation and the patient is agreeable to proceed. Written consent to be obtained.

## 2016-02-06 NOTE — Progress Notes (Signed)
Quick Note:  His ferritin is elevated in setting of chronic prednisone therapy??? I suspect he has fatty liver based on all other negative labs and u/s finding point towards fatty liver.   1. Let's get hemochromatosis genetics for completion.  2. Go ahead with EGD/ED with [facility] in OR (polypharmacy) for dysphagia. ______

## 2016-02-09 DIAGNOSIS — R252 Cramp and spasm: Secondary | ICD-10-CM | POA: Diagnosis not present

## 2016-02-09 DIAGNOSIS — M549 Dorsalgia, unspecified: Secondary | ICD-10-CM | POA: Diagnosis not present

## 2016-02-09 DIAGNOSIS — R5383 Other fatigue: Secondary | ICD-10-CM | POA: Diagnosis not present

## 2016-02-09 DIAGNOSIS — Z79899 Other long term (current) drug therapy: Secondary | ICD-10-CM | POA: Diagnosis not present

## 2016-02-09 DIAGNOSIS — K219 Gastro-esophageal reflux disease without esophagitis: Secondary | ICD-10-CM | POA: Diagnosis not present

## 2016-02-09 DIAGNOSIS — G35 Multiple sclerosis: Secondary | ICD-10-CM | POA: Diagnosis not present

## 2016-02-09 DIAGNOSIS — I10 Essential (primary) hypertension: Secondary | ICD-10-CM | POA: Diagnosis not present

## 2016-02-09 DIAGNOSIS — D860 Sarcoidosis of lung: Secondary | ICD-10-CM | POA: Diagnosis not present

## 2016-02-09 NOTE — Progress Notes (Signed)
CC'D TO PCP °

## 2016-02-10 ENCOUNTER — Other Ambulatory Visit: Payer: Self-pay

## 2016-02-10 ENCOUNTER — Other Ambulatory Visit: Payer: Self-pay | Admitting: Gastroenterology

## 2016-02-10 DIAGNOSIS — R7989 Other specified abnormal findings of blood chemistry: Secondary | ICD-10-CM

## 2016-02-10 NOTE — Progress Notes (Signed)
Quick Note:  Forwarding to [facility] clinical pool to schedule EGD/ED with [facility] in OR (polypharmacy) for dysphagia. ______

## 2016-02-11 ENCOUNTER — Other Ambulatory Visit: Payer: Self-pay

## 2016-02-12 NOTE — Patient Instructions (Signed)
Chad Vasquez  02/12/2016     @PREFPERIOPPHARMACY @   Your procedure is scheduled on 02/16/16.  Report to [facility] at 1:00 P.M.  Call this number if you have problems the morning of surgery:  [phone]   Remember:  Do not eat food or drink liquids after midnight.  Take these medicines the morning of surgery with A SIP OF WATER Baclofen, Valium, Cozaar, Prilosec, Prednisone, Pro Air inhaler, Ultram if needed   Do not wear jewelry, make-up or nail polish.  Do not wear lotions, powders, or perfumes.  You may wear deodorant.  Do not shave 48 hours prior to surgery.  Men may shave face and neck.  Do not bring valuables to the hospital.  [facility] is not responsible for any belongings or valuables.  Contacts, dentures or bridgework may not be worn into surgery.  Leave your suitcase in the car.  After surgery it may be brought to your room.  For patients admitted to the hospital, discharge time will be determined by your treatment team.  Patients discharged the day of surgery will not be allowed to drive home.    Please read over the following fact sheets that you were given. Anesthesia Post-op Instructions     PATIENT INSTRUCTIONS POST-ANESTHESIA  IMMEDIATELY FOLLOWING SURGERY:  Do not drive or operate machinery for the first twenty four hours after surgery.  Do not make any important decisions for twenty four hours after surgery or while taking narcotic pain medications or sedatives.  If you develop intractable nausea and vomiting or a severe headache please notify your doctor immediately.  FOLLOW-UP:  Please make an appointment with your surgeon as instructed. You do not need to follow up with anesthesia unless specifically instructed to do so.  WOUND CARE INSTRUCTIONS (if applicable):  Keep a dry clean dressing on the anesthesia/puncture wound site if there is drainage.  Once the wound has quit draining you may leave it open to air.  Generally you should leave the  bandage intact for twenty four hours unless there is drainage.  If the epidural site drains for more than 36-48 hours please call the anesthesia department.  QUESTIONS?:  Please feel free to call your physician or the hospital operator if you have any questions, and they will be happy to assist you.      Esophagogastroduodenoscopy Esophagogastroduodenoscopy (EGD) is a procedure that is used to examine the lining of the esophagus, stomach, and first part of the small intestine (duodenum). A long, flexible, lighted tube with a camera attached (endoscope) is inserted down the throat to view these organs. This procedure is done to detect problems or abnormalities, such as inflammation, bleeding, ulcers, or growths, in order to treat them. The procedure lasts 5-20 minutes. It is usually an outpatient procedure, but it may need to be performed in a hospital in emergency cases. LET [facility] CARE PROVIDER KNOW ABOUT:  Any allergies you have.  All medicines you are taking, including vitamins, herbs, eye drops, creams, and over-the-counter medicines.  Previous problems you or members of your family have had with the use of anesthetics.  Any blood disorders you have.  Previous surgeries you have had.  Medical conditions you have. RISKS AND COMPLICATIONS Generally, this is a safe procedure. However, problems can occur and include:  Infection.  Bleeding.  Tearing (perforation) of the esophagus, stomach, or duodenum.  Difficulty breathing or not being able to breathe.  Excessive sweating.  Spasms of the larynx.  Slowed heartbeat.  Low blood pressure. BEFORE THE PROCEDURE  Do not eat or drink anything after midnight on the night before the procedure or as directed by your health care provider.  Do not take your regular medicines before the procedure if your health care provider asks you not to. Ask your health care provider about changing or stopping those medicines.  If you wear  dentures, be prepared to remove them before the procedure.  Arrange for someone to drive you home after the procedure. PROCEDURE  A numbing medicine (local anesthetic) may be sprayed in your throat for comfort and to stop you from gagging or coughing.  You will have an IV tube inserted in a vein in your hand or arm. You will receive medicines and fluids through this tube.  You will be given a medicine to relax you (sedative).  A pain reliever will be given through the IV tube.  A mouth guard may be placed in your mouth to protect your teeth and to keep you from biting on the endoscope.  You will be asked to lie on your left side.  The endoscope will be inserted down your throat and into your esophagus, stomach, and duodenum.  Air will be put through the endoscope to allow your health care provider to clearly view the lining of your esophagus.  The lining of your esophagus, stomach, and duodenum will be examined. During the exam, your health care provider may:  Remove tissue to be examined under a microscope (biopsy) for inflammation, infection, or other medical problems.  Remove growths.  Remove objects (foreign bodies) that are stuck.  Treat any bleeding with medicines or other devices that stop tissues from bleeding (hot cautery, clipping devices).  Widen (dilate) or stretch narrowed areas of your esophagus and stomach.  The endoscope will be withdrawn. AFTER THE PROCEDURE  You will be taken to a recovery area for observation. Your blood pressure, heart rate, breathing rate, and blood oxygen level will be monitored often until the medicines you were given have worn off.  Do not eat or drink anything until the numbing medicine has worn off and your gag reflex has returned. You may choke.  Your health care provider should be able to discuss his or her findings with you. It will take longer to discuss the test results if any biopsies were taken.   This information is not  intended to replace advice given to you by your health care provider. Make sure you discuss any questions you have with your health care provider.   Document Released: 01/21/2005 Document Revised: 10/11/2014 Document Reviewed: 08/23/2012 Elsevier Interactive Patient Education 2016 [employer] Esophageal Dilatation Esophageal dilatation is a procedure to open a blocked or narrowed part of the esophagus. The esophagus is the long tube in your throat that carries food and liquid from your mouth to your stomach. The procedure is also called esophageal dilation.  You may need this procedure if you have a buildup of scar tissue in your esophagus that makes it difficult, painful, or even impossible to swallow. This can be caused by gastroesophageal reflux disease (GERD). In rare cases, people need this procedure because they have cancer of the esophagus or a problem with the way food moves through the esophagus. Sometimes you may need to have another dilatation to enlarge the opening of the esophagus gradually. LET [facility] CARE PROVIDER KNOW ABOUT:   Any allergies you have.  All medicines you are taking, including vitamins, herbs, eye drops, creams, and over-the-counter medicines.  Previous problems you or members of your family have had with the use of anesthetics.  Any blood disorders you have.  Previous surgeries you have had.  Medical conditions you have.  Any antibiotic medicines you are required to take before dental procedures. RISKS AND COMPLICATIONS Generally, this is a safe procedure. However, problems can occur and include:  Bleeding from a tear in the lining of the esophagus.  A hole (perforation) in the esophagus. BEFORE THE PROCEDURE  Do not eat or drink anything after midnight on the night before the procedure or as directed by your health care provider.  Ask your health care provider about changing or stopping your regular medicines. This is especially important if you  are taking diabetes medicines or blood thinners.  Plan to have someone take you home after the procedure. PROCEDURE   You will be given a medicine that makes you relaxed and sleepy (sedative).  A medicine may be sprayed or gargled to numb the back of the throat.  Your health care provider can use various instruments to do an esophageal dilatation. During the procedure, the instrument used will be placed in your mouth and passed down into your esophagus. Options include:  Simple dilators. This instrument is carefully placed in the esophagus to stretch it.  Guided wire bougies. In this method, a flexible tube (endoscope) is used to insert a wire into the esophagus. The dilator is passed over this wire to enlarge the esophagus. Then the wire is removed.  Balloon dilators. An endoscope with a small balloon at the end is passed down into the esophagus. Inflating the balloon gently stretches the esophagus and opens it up. AFTER THE PROCEDURE  Your blood pressure, heart rate, breathing rate, and blood oxygen level will be monitored often until the medicines you were given have worn off.  Your throat may feel slightly sore and will probably still feel numb. This will improve slowly over time.  You will not be allowed to eat or drink until the throat numbness has resolved.  If this is a same-day procedure, you may be allowed to go home once you have been able to drink, urinate, and sit on the edge of the bed without nausea or dizziness.  If this is a same-day procedure, you should have a friend or family member with you for the next 24 hours after the procedure.   This information is not intended to replace advice given to you by your health care provider. Make sure you discuss any questions you have with your health care provider.   Document Released: 11/11/2005 Document Revised: 10/11/2014 Document Reviewed: 01/30/2014 Elsevier Interactive Patient Education [employer]

## 2016-02-13 ENCOUNTER — Encounter (HOSPITAL_COMMUNITY): Payer: Self-pay

## 2016-02-13 ENCOUNTER — Encounter (HOSPITAL_COMMUNITY)
Admission: RE | Admit: 2016-02-13 | Discharge: 2016-02-13 | Disposition: A | Discharge status: Home / Self Care | Payer: Medicare Other | Source: Ambulatory Visit | Attending: Internal Medicine | Admitting: Internal Medicine

## 2016-02-13 ENCOUNTER — Other Ambulatory Visit: Payer: Self-pay

## 2016-02-13 DIAGNOSIS — D869 Sarcoidosis, unspecified: Secondary | ICD-10-CM

## 2016-02-13 DIAGNOSIS — Z87891 Personal history of nicotine dependence: Secondary | ICD-10-CM | POA: Diagnosis not present

## 2016-02-13 DIAGNOSIS — E119 Type 2 diabetes mellitus without complications: Secondary | ICD-10-CM | POA: Diagnosis not present

## 2016-02-13 DIAGNOSIS — Z6833 Body mass index (BMI) 33.0-33.9, adult: Secondary | ICD-10-CM | POA: Diagnosis not present

## 2016-02-13 DIAGNOSIS — G8929 Other chronic pain: Secondary | ICD-10-CM | POA: Diagnosis not present

## 2016-02-13 DIAGNOSIS — I10 Essential (primary) hypertension: Secondary | ICD-10-CM | POA: Diagnosis not present

## 2016-02-13 DIAGNOSIS — G4730 Sleep apnea, unspecified: Secondary | ICD-10-CM | POA: Diagnosis not present

## 2016-02-13 DIAGNOSIS — Z01812 Encounter for preprocedural laboratory examination: Secondary | ICD-10-CM | POA: Diagnosis not present

## 2016-02-13 DIAGNOSIS — K222 Esophageal obstruction: Secondary | ICD-10-CM | POA: Diagnosis not present

## 2016-02-13 DIAGNOSIS — I2510 Atherosclerotic heart disease of native coronary artery without angina pectoris: Secondary | ICD-10-CM | POA: Diagnosis not present

## 2016-02-13 DIAGNOSIS — Z79899 Other long term (current) drug therapy: Secondary | ICD-10-CM | POA: Diagnosis not present

## 2016-02-13 DIAGNOSIS — K449 Diaphragmatic hernia without obstruction or gangrene: Secondary | ICD-10-CM | POA: Diagnosis not present

## 2016-02-13 DIAGNOSIS — R1310 Dysphagia, unspecified: Secondary | ICD-10-CM | POA: Diagnosis not present

## 2016-02-13 DIAGNOSIS — K209 Esophagitis, unspecified: Secondary | ICD-10-CM | POA: Diagnosis not present

## 2016-02-13 DIAGNOSIS — E785 Hyperlipidemia, unspecified: Secondary | ICD-10-CM | POA: Diagnosis not present

## 2016-02-13 DIAGNOSIS — Z01810 Encounter for preprocedural cardiovascular examination: Secondary | ICD-10-CM | POA: Diagnosis not present

## 2016-02-13 DIAGNOSIS — F418 Other specified anxiety disorders: Secondary | ICD-10-CM | POA: Diagnosis not present

## 2016-02-13 HISTORY — DX: Unspecified osteoarthritis, unspecified site: M19.90

## 2016-02-13 HISTORY — DX: Chronic obstructive pulmonary disease, unspecified: J44.9

## 2016-02-13 HISTORY — DX: Gastro-esophageal reflux disease without esophagitis: K21.9

## 2016-02-13 LAB — BASIC METABOLIC PANEL
Anion gap: 10 (ref 5–15)
BUN: 10 mg/dL (ref 6–20)
CALCIUM: 9.1 mg/dL (ref 8.9–10.3)
CO2: 23 mmol/L (ref 22–32)
CREATININE: 0.88 mg/dL (ref 0.61–1.24)
Chloride: 103 mmol/L (ref 101–111)
GFR calc Af Amer: >60 mL/min (ref >60)
GLUCOSE: 177 mg/dL — AB (ref 65–99)
Potassium: 4 mmol/L (ref 3.5–5.1)
Sodium: 136 mmol/L (ref 135–145)

## 2016-02-13 NOTE — Pre-Procedure Instructions (Signed)
Patient given information to sign up for my chart. 

## 2016-02-13 NOTE — Pre-Procedure Instructions (Signed)
Dr Benancio Deeds aware of patients extensive history including MS and Sarcoidosis. Patient has been asymptomatic. Does not want chest x-ray.

## 2016-02-16 ENCOUNTER — Ambulatory Visit (HOSPITAL_COMMUNITY)
Admission: RE | Admit: 2016-02-16 | Discharge: 2016-02-16 | Disposition: A | Discharge status: Home / Self Care | Payer: Medicare Other | Source: Ambulatory Visit | Attending: Internal Medicine | Admitting: Internal Medicine

## 2016-02-16 ENCOUNTER — Encounter (HOSPITAL_COMMUNITY): Payer: Self-pay | Admitting: *Deleted

## 2016-02-16 ENCOUNTER — Ambulatory Visit (HOSPITAL_COMMUNITY): Payer: Medicare Other | Admitting: Anesthesiology

## 2016-02-16 ENCOUNTER — Encounter (HOSPITAL_COMMUNITY)
Admission: RE | Disposition: A | Discharge status: Home / Self Care | Payer: Self-pay | Source: Ambulatory Visit | Attending: Internal Medicine

## 2016-02-16 DIAGNOSIS — K209 Esophagitis, unspecified: Secondary | ICD-10-CM | POA: Diagnosis not present

## 2016-02-16 DIAGNOSIS — K2100 Gastro-esophageal reflux disease with esophagitis, without bleeding: Secondary | ICD-10-CM | POA: Insufficient documentation

## 2016-02-16 DIAGNOSIS — K449 Diaphragmatic hernia without obstruction or gangrene: Secondary | ICD-10-CM | POA: Diagnosis not present

## 2016-02-16 DIAGNOSIS — R1310 Dysphagia, unspecified: Secondary | ICD-10-CM | POA: Diagnosis not present

## 2016-02-16 DIAGNOSIS — D869 Sarcoidosis, unspecified: Secondary | ICD-10-CM | POA: Insufficient documentation

## 2016-02-16 DIAGNOSIS — G8929 Other chronic pain: Secondary | ICD-10-CM | POA: Insufficient documentation

## 2016-02-16 DIAGNOSIS — E119 Type 2 diabetes mellitus without complications: Secondary | ICD-10-CM | POA: Insufficient documentation

## 2016-02-16 DIAGNOSIS — K222 Esophageal obstruction: Secondary | ICD-10-CM | POA: Insufficient documentation

## 2016-02-16 DIAGNOSIS — F418 Other specified anxiety disorders: Secondary | ICD-10-CM | POA: Insufficient documentation

## 2016-02-16 DIAGNOSIS — Z87891 Personal history of nicotine dependence: Secondary | ICD-10-CM | POA: Insufficient documentation

## 2016-02-16 DIAGNOSIS — Z01812 Encounter for preprocedural laboratory examination: Secondary | ICD-10-CM | POA: Insufficient documentation

## 2016-02-16 DIAGNOSIS — Q394 Esophageal web: Secondary | ICD-10-CM | POA: Diagnosis not present

## 2016-02-16 DIAGNOSIS — Z6833 Body mass index (BMI) 33.0-33.9, adult: Secondary | ICD-10-CM | POA: Insufficient documentation

## 2016-02-16 DIAGNOSIS — E785 Hyperlipidemia, unspecified: Secondary | ICD-10-CM | POA: Insufficient documentation

## 2016-02-16 DIAGNOSIS — G4730 Sleep apnea, unspecified: Secondary | ICD-10-CM | POA: Diagnosis not present

## 2016-02-16 DIAGNOSIS — J449 Chronic obstructive pulmonary disease, unspecified: Secondary | ICD-10-CM | POA: Diagnosis not present

## 2016-02-16 DIAGNOSIS — Z79899 Other long term (current) drug therapy: Secondary | ICD-10-CM | POA: Insufficient documentation

## 2016-02-16 DIAGNOSIS — I2510 Atherosclerotic heart disease of native coronary artery without angina pectoris: Secondary | ICD-10-CM | POA: Insufficient documentation

## 2016-02-16 DIAGNOSIS — I10 Essential (primary) hypertension: Secondary | ICD-10-CM | POA: Insufficient documentation

## 2016-02-16 DIAGNOSIS — Z01810 Encounter for preprocedural cardiovascular examination: Secondary | ICD-10-CM | POA: Insufficient documentation

## 2016-02-16 HISTORY — PX: MALONEY DILATION: SHX5535

## 2016-02-16 HISTORY — PX: ESOPHAGOGASTRODUODENOSCOPY (EGD) WITH PROPOFOL: SHX5813

## 2016-02-16 LAB — GLUCOSE, CAPILLARY
GLUCOSE-CAPILLARY: 178 mg/dL — AB (ref 65–99)
Glucose-Capillary: 162 mg/dL — ABNORMAL HIGH (ref 65–99)

## 2016-02-16 SURGERY — ESOPHAGOGASTRODUODENOSCOPY (EGD) WITH PROPOFOL
Anesthesia: Monitor Anesthesia Care

## 2016-02-16 MED ORDER — PROPOFOL 10 MG/ML IV BOLUS
INTRAVENOUS | Status: AC
  Filled 2016-02-16: qty 40

## 2016-02-16 MED ORDER — MIDAZOLAM HCL 5 MG/5ML IJ SOLN
INTRAMUSCULAR | Status: DC | PRN
  Administered 2016-02-16: 2 mg via INTRAVENOUS

## 2016-02-16 MED ORDER — ONDANSETRON HCL 4 MG/2ML IJ SOLN: 4.0000 mg | Freq: Once | INTRAMUSCULAR | Status: DC | PRN

## 2016-02-16 MED ORDER — PROPOFOL 500 MG/50ML IV EMUL
INTRAVENOUS | Status: DC | PRN
  Administered 2016-02-16: 08:00:00 via INTRAVENOUS
  Administered 2016-02-16: 125 ug/kg/min via INTRAVENOUS

## 2016-02-16 MED ORDER — PROPOFOL 10 MG/ML IV BOLUS
INTRAVENOUS | Status: DC | PRN
  Administered 2016-02-16 (×2): 10 mg via INTRAVENOUS

## 2016-02-16 MED ORDER — MIDAZOLAM HCL 2 MG/2ML IJ SOLN
1.0000 mg | INTRAMUSCULAR | Status: DC | PRN
  Administered 2016-02-16: 2 mg via INTRAVENOUS

## 2016-02-16 MED ORDER — LACTATED RINGERS IV SOLN
INTRAVENOUS | Status: DC
  Administered 2016-02-16: 07:00:00 via INTRAVENOUS

## 2016-02-16 MED ORDER — MIDAZOLAM HCL 2 MG/2ML IJ SOLN
INTRAMUSCULAR | Status: AC
  Filled 2016-02-16: qty 2

## 2016-02-16 MED ORDER — LIDOCAINE VISCOUS 2 % MT SOLN
OROMUCOSAL | Status: AC
  Filled 2016-02-16: qty 15

## 2016-02-16 MED ORDER — FENTANYL CITRATE (PF) 100 MCG/2ML IJ SOLN
INTRAMUSCULAR | Status: AC
  Filled 2016-02-16: qty 2

## 2016-02-16 MED ORDER — LIDOCAINE HCL (PF) 1 % IJ SOLN
INTRAMUSCULAR | Status: AC
  Filled 2016-02-16: qty 5

## 2016-02-16 MED ORDER — LIDOCAINE VISCOUS 2 % MT SOLN
3.0000 mL | OROMUCOSAL | Status: AC | PRN
  Administered 2016-02-16 (×2): 3 mL via OROMUCOSAL

## 2016-02-16 MED ORDER — FENTANYL CITRATE (PF) 100 MCG/2ML IJ SOLN
25.0000 ug | INTRAMUSCULAR | Status: AC
  Administered 2016-02-16 (×2): 25 ug via INTRAVENOUS

## 2016-02-16 MED ORDER — FENTANYL CITRATE (PF) 100 MCG/2ML IJ SOLN: 25.0000 ug | INTRAMUSCULAR | Status: DC | PRN

## 2016-02-16 NOTE — H&P (View-Only) (Signed)
Primary Care Physician:  Colette Ribas, MD  Primary Gastroenterologist:  Roetta Sessions, MD Referring provider: Dr. Gerilyn Pilgrim  Chief Complaint  Patient presents with  . Elevated Hepatic Enzymes    HPI:  Chad Vasquez is a 55 y.o. male here at the request of Dr. Gerilyn Pilgrim for further evaluation of abnormal LFTs.   Patient reports diagnosis of "probable" MS back in 2010. At the time he had asymmetric lower extremity weakness. He states that the workup was inconclusive but probable MS. He doubts the diagnosis. He reports in 2013 he presented with high fever, severe pain, and developed inability to fall. Noted to have abnormality on chest x-ray, biopsy with diagnosis of sarcoidosis at that time. States he had 3 attempts for spinal tap during the 2013 episode and he feels that is why he is no longer able to ambulate.  Patient has been on a couple different things for MS in the past. Also on quite a bit of prednisone and/or Solu-Medrol. At one point his weight got up to 299 pounds. He has had issues with swelling. Has been on Aubagio for about 18 months for his MS. Recently stopped back in March due to elevated LFTs. Abnormal LFTs first noted by PCP apparently. Back in 2013 when he was acutely ill he did have elevation of AST and ALT at that time it unclear if this was his baseline or during acute illness.   Patient denies abdominal pain. He has some constipation. No melena rectal bleeding. Feels like solid food gets stuck in the esophagus and causes pain. When this occurs she can get anything down. This is been going on for "quite a while". No report of melena rectal bleeding. No heartburn or vomiting.  From PCP notes the following labs 10/19/2015: AST/ALT = 29/56 12/15/2015: AST/ALT = 40/118 12/23/2015: AST/ALT =l 43/108  Current Outpatient Prescriptions  Medication Sig Dispense Refill  . baclofen (LIORESAL) 20 MG tablet Take 1 tablet (20 mg total) by mouth 3 (three) times daily. 90 each 12   . calcium carbonate (TUMS - DOSED IN MG ELEMENTAL CALCIUM) 500 MG chewable tablet Chew 1 tablet by mouth daily as needed for indigestion or heartburn. Reported on 02/03/2016    . diazepam (VALIUM) 5 MG tablet Take 10 mg by mouth at bedtime.     . docusate sodium (COLACE) 100 MG capsule Take 200 mg by mouth at bedtime. Reported on 02/03/2016    . furosemide (LASIX) 20 MG tablet Take 20 mg by mouth as needed.    . furosemide (LASIX) 40 MG tablet Take 40 mg by mouth as needed.    Marland Kitchen losartan (COZAAR) 100 MG tablet Take 1 tablet by mouth every morning.     . polyethylene glycol powder (GLYCOLAX/MIRALAX) powder     . predniSONE (DELTASONE) 20 MG tablet Take 20 mg by mouth 3 (three) times daily.    Marland Kitchen PROAIR HFA 108 (90 BASE) MCG/ACT inhaler INHALE TWO PUFFS INTO THE LUNGS EVERY SIX HOURS AS NEEDED FOR WHEEZING 8.5 g 3  . traMADol (ULTRAM) 50 MG tablet Take 50 mg by mouth 2 (two) times daily.    . Vitamin D, Ergocalciferol, (DRISDOL) 50000 UNITS CAPS capsule Take 50,000 Units by mouth every 7 (seven) days. Takes on Wedneday    . pantoprazole (PROTONIX) 40 MG tablet Take 40 mg by mouth daily as needed (for acid reflux/indigestion).      No current facility-administered medications for this visit.    Allergies as of 02/03/2016  . (No  Known Allergies)    Past Medical History  Diagnosis Date  . Multiple sclerosis (HCC) 2010  . HTN (hypertension)   . Polysubstance abuse 2013  . Anxiety and depression   . Chronic pain   . Coronary artery disease   . Hyperlipidemia   . Sleep apnea     CPAP  . Sarcoidosis (HCC)     2013, ???    Past Surgical History  Procedure Laterality Date  . Mediastinoscopy  05/15/2012    Procedure: MEDIASTINOSCOPY;  Surgeon: Loreli Slot, MD;  Location: [facility] OR;  Service: Thoracic;  Laterality: N/A;  . Cholecystectomy N/A 04/16/2013    Procedure: LAPAROSCOPIC CHOLECYSTECTOMY;  Surgeon: Dalia Heading, MD;  Location: [facility] ORS;  Service: General;  Laterality: N/A;  .  Cardiac catheterization  2005  . Colonoscopy  2011    Dr. Jena Gauss: normal    Family History  Problem Relation Age of Onset  . Heart failure Mother   . Stroke Mother   . Heart failure Father   . Breast cancer Sister   . Seizures Brother   . Liver disease Neg Hx   . Breast cancer Sister   . Colon cancer Neg Hx     Social History   Social History  . Marital Status: Single    Spouse Name: N/A  . Number of Children: 0  . Years of Education: HS   Occupational History  . auto technician      disabled    Social History Main Topics  . Smoking status: Former Smoker -- 1.00 packs/day for 30 years    Types: Cigarettes    Quit date: 05/01/2012  . Smokeless tobacco: Never Used  . Alcohol Use: No     Comment: quit 05/01/2012  . Drug Use: Yes    Special: Cocaine, Marijuana     Comment: 05/01/2012 last use  . Sexual Activity: No   Other Topics Concern  . Not on file   Social History Narrative   Patient lives at home with spouse.   Caffeine Use: 3 cups daily      ROS:  General: Negative for anorexia, weight loss, fever, chills, fatigue, weakness. Eyes: Negative for vision changes.  ENT: Negative for hoarseness, difficulty swallowing , nasal congestion. CV: Negative for chest pain, angina, palpitations, dyspnea on exertion, peripheral edema.  Respiratory: Negative for dyspnea at rest, dyspnea on exertion, cough, sputum, wheezing.  GI: See history of present illness. GU:  Negative for dysuria, hematuria, urinary incontinence, urinary frequency, nocturnal urination.  MS: Negative for joint pain, low back pain.  Derm: Negative for rash or itching.  Neuro: Negative for weakness, abnormal sensation, seizure, frequent headaches, memory loss, confusion. Chronic lower ext weakness Psych: Negative for anxiety, depression, suicidal ideation, hallucinations.  Endo: Negative for unusual weight change.  Heme: Negative for bruising or bleeding. Allergy: Negative for rash or hives.     Physical Examination:  BP 148/94 mmHg  Pulse 90  Temp(Src) 98.4 F (36.9 C) (Oral)  Ht 6\' 3"  (1.905 m)  Wt 270 lb (122.471 kg)  BMI 33.75 kg/m2   General: Well-nourished, well-developed in no acute distress. In wheelchair. Wife present Head: Normocephalic, atraumatic.   Eyes: Conjunctiva pink, no icterus. Mouth: Oropharyngeal mucosa moist and pink , no lesions erythema or exudate. Neck: Supple without thyromegaly, masses, or lymphadenopathy.  Lungs: Clear to auscultation bilaterally.  Heart: Regular rate and rhythm, no murmurs rubs or gallops.  Abdomen: Bowel sounds are normal, nontender, nondistended, no hepatosplenomegaly or masses, no  abdominal bruits or    hernia , no rebound or guarding.  Difficult to exam due to body habitus and in wheelchair Rectal: deferred Extremities: No lower extremity edema. No clubbing or deformities.  Neuro: Alert and oriented x 4 , grossly normal neurologically.  Skin: Warm and dry, no rash or jaundice.   Psych: Alert and cooperative, normal mood and affect.  Labs: April 2017 outside labs White blood cell count 10,700, hemoglobin 12.9, hematocrit 38.7, platelets 235,000, sodium 141, potassium 4.6, BUN 12, creatinine 0.86, total bilirubin 0.4, alkaline phosphatase 47, AST 49, ALT 123, albumin 4.  12/23/2015 outside labs AST 43, ALT 108  12/15/2015 outside labs AST 40, ALT 118, TSH 0.006 low  Imaging Studies: No results found.

## 2016-02-16 NOTE — Interval H&P Note (Signed)
History and Physical Interval Note:  02/16/2016 7:32 AM  Chad Vasquez  has presented today for surgery, with the diagnosis of DYSPHAGIA  The various methods of treatment have been discussed with the patient and family. After consideration of risks, benefits and other options for treatment, the patient has consented to  Procedure(s) with comments: ESOPHAGOGASTRODUODENOSCOPY (EGD) WITH PROPOFOL (N/A) - 230 - moved to 7:45 - pt is aware to arrive at 6:15 MALONEY DILATION (N/A) as a surgical intervention .  The patient's history has been reviewed, patient examined, no change in status, stable for surgery.  I have reviewed the patient's chart and labs.  Questions were answered to the patient's satisfaction.     Chad Vasquez  No change. EGD/ED per plan.  The risks, benefits, limitations, alternatives and imponderables have been reviewed with the patient. Potential for esophageal dilation, biopsy, etc. have also been reviewed.  Questions have been answered. All parties agreeable.

## 2016-02-16 NOTE — Anesthesia Postprocedure Evaluation (Signed)
Anesthesia Post Note  Patient: Chad Vasquez  Procedure(s) Performed: Procedure(s) (LRB): ESOPHAGOGASTRODUODENOSCOPY (EGD) WITH PROPOFOL (N/A) MALONEY DILATION (N/A)  Patient location during evaluation: Short Stay Anesthesia Type: MAC Level of consciousness: awake and alert and oriented Pain management: pain level controlled Respiratory status: spontaneous breathing Cardiovascular status: blood pressure returned to baseline Postop Assessment: no signs of nausea or vomiting Anesthetic complications: no    Last Vitals:  Filed Vitals:   02/16/16 0845 02/16/16 0904  BP: 143/86 148/85  Pulse: 82 81  Temp: 36.6 C 36.7 C  Resp: 12 16    Last Pain:  Filed Vitals:   02/16/16 0904  PainSc: 0-No pain                 Tameyah Koch

## 2016-02-16 NOTE — Discharge Instructions (Signed)
EGD Discharge instructions Please read the instructions outlined below and refer to this sheet in the next few weeks. These discharge instructions provide you with general information on caring for yourself after you leave the hospital. Your doctor may also give you specific instructions. While your treatment has been planned according to the most current medical practices available, unavoidable complications occasionally occur. If you have any problems or questions after discharge, please call your doctor. ACTIVITY  You may resume your regular activity but move at a slower pace for the next 24 hours.   Take frequent rest periods for the next 24 hours.   Walking will help expel (get rid of) the air and reduce the bloated feeling in your abdomen.   No driving for 24 hours (because of the anesthesia (medicine) used during the test).   You may shower.   Do not sign any important legal documents or operate any machinery for 24 hours (because of the anesthesia used during the test).  NUTRITION  Drink plenty of fluids.   You may resume your normal diet.   Begin with a light meal and progress to your normal diet.   Avoid alcoholic beverages for 24 hours or as instructed by your caregiver.  MEDICATIONS  You may resume your normal medications unless your caregiver tells you otherwise.  WHAT YOU CAN EXPECT TODAY  You may experience abdominal discomfort such as a feeling of fullness or gas pains.  FOLLOW-UP  Your doctor will discuss the results of your test with you.  SEEK IMMEDIATE MEDICAL ATTENTION IF ANY OF THE FOLLOWING OCCUR:  Excessive nausea (feeling sick to your stomach) and/or vomiting.   Severe abdominal pain and distention (swelling).   Trouble swallowing.   Temperature over 101 F (37.8 C).   Rectal bleeding or vomiting of blood.    Increase Protonix to 40 mg twice daily  GERD information provided  Office visit with [place] in 3 months  Aug 15 th at 8:30  am  Gastroesophageal Reflux Disease, Adult Normally, food travels down the esophagus and stays in the stomach to be digested. However, when a person has gastroesophageal reflux disease (GERD), food and stomach acid move back up into the esophagus. When this happens, the esophagus becomes sore and inflamed. Over time, GERD can create small holes (ulcers) in the lining of the esophagus.  CAUSES This condition is caused by a problem with the muscle between the esophagus and the stomach (lower esophageal sphincter, or LES). Normally, the LES muscle closes after food passes through the esophagus to the stomach. When the LES is weakened or abnormal, it does not close properly, and that allows food and stomach acid to go back up into the esophagus. The LES can be weakened by certain dietary substances, medicines, and medical conditions, including:  Tobacco use.  Pregnancy.  Having a hiatal hernia.  Heavy alcohol use.  Certain foods and beverages, such as coffee, chocolate, onions, and peppermint. RISK FACTORS This condition is more likely to develop in:  People who have an increased body weight.  People who have connective tissue disorders.  People who use NSAID medicines. SYMPTOMS Symptoms of this condition include:  Heartburn.  Difficult or painful swallowing.  The feeling of having a lump in the throat.  Abitter taste in the mouth.  Bad breath.  Having a large amount of saliva.  Having an upset or bloated stomach.  Belching.  Chest pain.  Shortness of breath or wheezing.  Ongoing (chronic) cough or a night-time cough.  Wearing away of tooth enamel.  Weight loss. Different conditions can cause chest pain. Make sure to see your health care provider if you experience chest pain. DIAGNOSIS Your health care provider will take a medical history and perform a physical exam. To determine if you have mild or severe GERD, your health care provider may also monitor how you  respond to treatment. You may also have other tests, including:  An endoscopy toexamine your stomach and esophagus with a small camera.  A test thatmeasures the acidity level in your esophagus.  A test thatmeasures how much pressure is on your esophagus.  A barium swallow or modified barium swallow to show the shape, size, and functioning of your esophagus. TREATMENT The goal of treatment is to help relieve your symptoms and to prevent complications. Treatment for this condition may vary depending on how severe your symptoms are. Your health care provider may recommend:  Changes to your diet.  Medicine.  Surgery. HOME CARE INSTRUCTIONS Diet  Follow a diet as recommended by your health care provider. This may involve avoiding foods and drinks such as:  Coffee and tea (with or without caffeine).  Drinks that containalcohol.  Energy drinks and sports drinks.  Carbonated drinks or sodas.  Chocolate and cocoa.  Peppermint and mint flavorings.  Garlic and onions.  Horseradish.  Spicy and acidic foods, including peppers, chili powder, curry powder, vinegar, hot sauces, and barbecue sauce.  Citrus fruit juices and citrus fruits, such as oranges, lemons, and limes.  Tomato-based foods, such as red sauce, chili, salsa, and pizza with red sauce.  Fried and fatty foods, such as donuts, french fries, potato chips, and high-fat dressings.  High-fat meats, such as hot dogs and fatty cuts of red and white meats, such as rib eye steak, sausage, ham, and bacon.  High-fat dairy items, such as whole milk, butter, and cream cheese.  Eat small, frequent meals instead of large meals.  Avoid drinking large amounts of liquid with your meals.  Avoid eating meals during the 2-3 hours before bedtime.  Avoid lying down right after you eat.  Do not exercise right after you eat. General Instructions  Pay attention to any changes in your symptoms.  Take over-the-counter and  prescription medicines only as told by your health care provider. Do not take aspirin, ibuprofen, or other NSAIDs unless your health care provider told you to do so.  Do not use any tobacco products, including cigarettes, chewing tobacco, and e-cigarettes. If you need help quitting, ask your health care provider.  Wear loose-fitting clothing. Do not wear anything tight around your waist that causes pressure on your abdomen.  Raise (elevate) the head of your bed 6 inches (15cm).  Try to reduce your stress, such as with yoga or meditation. If you need help reducing stress, ask your health care provider.  If you are overweight, reduce your weight to an amount that is healthy for you. Ask your health care provider for guidance about a safe weight loss goal.  Keep all follow-up visits as told by your health care provider. This is important. SEEK MEDICAL CARE IF:  You have new symptoms.  You have unexplained weight loss.  You have difficulty swallowing, or it hurts to swallow.  You have wheezing or a persistent cough.  Your symptoms do not improve with treatment.  You have a hoarse voice. SEEK IMMEDIATE MEDICAL CARE IF:  You have pain in your arms, neck, jaw, teeth, or back.  You feel sweaty, dizzy, or  light-headed.  You have chest pain or shortness of breath.  You vomit and your vomit looks like blood or coffee grounds.  You faint.  Your stool is bloody or black.  You cannot swallow, drink, or eat.   This information is not intended to replace advice given to you by your health care provider. Make sure you discuss any questions you have with your health care provider.   Document Released: 06/30/2005 Document Revised: 06/11/2015 Document Reviewed: 01/15/2015 Elsevier Interactive Patient Education [employer].

## 2016-02-16 NOTE — Transfer of Care (Signed)
Immediate Anesthesia Transfer of Care Note  Patient: Chad Vasquez  Procedure(s) Performed: Procedure(s) with comments: ESOPHAGOGASTRODUODENOSCOPY (EGD) WITH PROPOFOL (N/A) - 230 - moved to 7:45 - pt is aware to arrive at 6:15 MALONEY DILATION (N/A)  Patient Location: PACU  Anesthesia Type:MAC  Level of Consciousness: awake  Airway & Oxygen Therapy: Patient Spontanous Breathing and Patient connected to face mask oxygen  Post-op Assessment: Report given to RN  Post vital signs: Reviewed and stable  Last Vitals:  Filed Vitals:   02/16/16 0730 02/16/16 0735  BP: 142/82 135/79  Temp:    Resp: 16 13    Last Pain:  Filed Vitals:   02/16/16 0737  PainSc: 4       Patients Stated Pain Goal: 8 (02/16/16 3903)  Complications: No apparent anesthesia complications

## 2016-02-16 NOTE — Op Note (Signed)
[facility] Patient Name: Chad Vasquez Procedure Date: 02/16/2016 7:40 AM MRN: [id number] Date of Birth: 1961/04/07 Attending MD: Gennette Pac , MD CSN: [id number] Age: 55 Admit Type: Outpatient Procedure:                Upper GI endoscopy with Elease Hashimoto dilation Indications:              Dysphagia Providers:                Gennette Pac, MD, Edrick Kins, RN, Calton Dach, Technician Referring MD:              Medicines:                Monitored Anesthesia Care Complications:            No immediate complications. Estimated Blood Loss:     Estimated blood loss was minimal. Procedure:                Pre-Anesthesia Assessment:                           - Prior to the procedure, a History and Physical                            was performed, and patient medications and                            allergies were reviewed. The patient's tolerance of                            previous anesthesia was also reviewed. The risks                            and benefits of the procedure and the sedation                            options and risks were discussed with the patient.                            All questions were answered, and informed consent                            was obtained. Prior Anticoagulants: The patient has                            taken no previous anticoagulant or antiplatelet                            agents. ASA Grade Assessment: III - A patient with                            severe systemic disease. After reviewing the risks  and benefits, the patient was deemed in                            satisfactory condition to undergo the procedure.                           After obtaining informed consent, the endoscope was                            passed under direct vision. Throughout the                            procedure, the patient's blood pressure, pulse, and                             oxygen saturations were monitored continuously. The                            EG-[id number] ([id number]) scope was introduced through the                            mouth, and advanced to the second part of duodenum.                            The upper GI endoscopy was accomplished without                            difficulty. The patient tolerated the procedure                            well. Scope In: 7:52:32 AM Scope Out: 8:03:25 AM Total Procedure Duration: 0 hours 10 minutes 53 seconds  Findings:      LA Grade A (one or more mucosal breaks less than 5 mm, not extending       between tops of 2 mucosal folds) esophagitis was found 35 to 36 cm from       the incisors.      A small hiatal hernia was present. No Barrett's epithelium seen. No       tumor.      The exam was otherwise without abnormality.      The second portion of the duodenum was normal.      A mild Schatzki ring (acquired) was found at the gastroesophageal       junction. The scope was withdrawn. Dilation was performed with a Maloney       dilator with no resistance at 56 Fr. The scope was withdrawn. Dilation       was performed with a Maloney dilator with mild resistance at 58 Fr. A       look back revealed the ring remained intact. Subsequently, 2 quadrant       "bites" of the ring were taken with biopsy forceps. This was done       disrupted. Was done effectively without complication. Impression:               - LA Grade A esophagitis.                           -  Small hiatal hernia.                           - The examination was otherwise normal.                           - Normal second portion of the duodenum.                           - Mild Schatzki ring. Dilated and disrupted.                           - No specimens collected. Moderate Sedation:      Moderate (conscious) sedation was personally administered by an       anesthesia professional. The following parameters were monitored: oxygen       saturation,  heart rate, blood pressure, respiratory rate, EKG, adequacy       of pulmonary ventilation, and response to care. Total physician       intraservice time was 24 minutes. Recommendation:           - Patient has a contact number available for                            emergencies. The signs and symptoms of potential                            delayed complications were discussed with the                            patient. Return to normal activities tomorrow.                            Written discharge instructions were provided to the                            patient.                           - Resume previous diet.                           - Continue present medications.                           - Use Protonix (pantoprazole) 40 mg PO BID                            indefinitely. Procedure Code(s):        --- Professional ---                           [phone], Esophagogastroduodenoscopy, flexible,                            transoral; diagnostic, including collection of  specimen(s) by brushing or washing, when performed                            (separate procedure)                           43450, Dilation of esophagus, by unguided sound or                            bougie, single or multiple passes Diagnosis Code(s):        --- Professional ---                           K20.9, Esophagitis, unspecified                           K44.9, Diaphragmatic hernia without obstruction or                            gangrene                           K22.2, Esophageal obstruction                           R13.10, Dysphagia, unspecified CPT copyright 2016 American Medical Association. All rights reserved. The codes documented in this report are preliminary and upon coder review may  be revised to meet current compliance requirements. Chad Vasquez. Chad Bonham, MD Gennette Pac, MD 02/16/2016 8:15:07 AM This report has been signed electronically. Number of Addenda:  0

## 2016-02-16 NOTE — Anesthesia Preprocedure Evaluation (Signed)
Anesthesia Evaluation  Patient identified by MRN, date of birth, ID band Patient awake    Reviewed: Allergy & Precautions, H&P , NPO status , Patient's Chart, lab work & pertinent test results  Airway Mallampati: III  TM Distance: >3 FB Neck ROM: Full    Dental  (+) Teeth Intact   Pulmonary sleep apnea and Continuous Positive Airway Pressure Ventilation , COPD, former smoker,  Sarcoidosis   breath sounds clear to auscultation       Cardiovascular hypertension, Pt. on medications + CAD   Rhythm:Regular Rate:Normal     Neuro/Psych PSYCHIATRIC DISORDERS Anxiety Depression Hx multiple sclerosis    GI/Hepatic GERD  ,(+)     substance abuse  cocaine use and marijuana use,   Endo/Other  diabetes, Type obesity  Renal/GU      Musculoskeletal   Abdominal   Peds  Hematology   Anesthesia Other Findings   Reproductive/Obstetrics                             Anesthesia Physical Anesthesia Plan  ASA: III  Anesthesia Plan: MAC   Post-op Pain Management:    Induction: Intravenous  Airway Management Planned: Simple Face Mask  Additional Equipment:   Intra-op Plan:   Post-operative Plan:   Informed Consent: I have reviewed the patients History and Physical, chart, labs and discussed the procedure including the risks, benefits and alternatives for the proposed anesthesia with the patient or authorized representative who has indicated his/her understanding and acceptance.     Plan Discussed with:   Anesthesia Plan Comments:         Anesthesia Quick Evaluation

## 2016-02-18 ENCOUNTER — Encounter (HOSPITAL_COMMUNITY): Payer: Self-pay | Admitting: Internal Medicine

## 2016-02-18 DIAGNOSIS — Z79899 Other long term (current) drug therapy: Secondary | ICD-10-CM | POA: Diagnosis not present

## 2016-02-18 DIAGNOSIS — E7800 Pure hypercholesterolemia, unspecified: Secondary | ICD-10-CM | POA: Diagnosis not present

## 2016-02-18 DIAGNOSIS — Z7952 Long term (current) use of systemic steroids: Secondary | ICD-10-CM | POA: Diagnosis not present

## 2016-02-18 DIAGNOSIS — G35 Multiple sclerosis: Secondary | ICD-10-CM | POA: Diagnosis not present

## 2016-02-18 DIAGNOSIS — I10 Essential (primary) hypertension: Secondary | ICD-10-CM | POA: Diagnosis not present

## 2016-02-18 DIAGNOSIS — Z87891 Personal history of nicotine dependence: Secondary | ICD-10-CM | POA: Diagnosis not present

## 2016-02-19 ENCOUNTER — Telehealth: Payer: Self-pay

## 2016-02-19 NOTE — Telephone Encounter (Signed)
Chad Vasquez contacted the patient on 02/10/2016 with lab and ultrasound results. Please let patient know that his ultrasound showed fatty liver. His most recent labs are still pending, we will contact him when results are available.

## 2016-02-19 NOTE — Telephone Encounter (Signed)
Pt is calling to see if the results are back from his [place]. Please advise

## 2016-02-20 LAB — HEMOCHROMATOSIS DNA-PCR(C282Y,H63D)

## 2016-02-20 NOTE — Progress Notes (Signed)
Quick Note:  Patient is correctly set up for EGD NOT a TCS as stated. ______

## 2016-03-03 NOTE — Progress Notes (Signed)
Quick Note:  Please let patient know that his hemochromatosis labs were negative for a genetic cause of iron overload.  Suspect fatty liver as cause of abnormal LFTs.  I would recommend  LFTs, ferritin in 6 weeks. OV with [facility] in 6 weeks. ______

## 2016-03-04 ENCOUNTER — Other Ambulatory Visit: Payer: Self-pay | Admitting: Gastroenterology

## 2016-03-04 DIAGNOSIS — R7989 Other specified abnormal findings of blood chemistry: Secondary | ICD-10-CM

## 2016-03-04 DIAGNOSIS — R945 Abnormal results of liver function studies: Secondary | ICD-10-CM

## 2016-03-05 ENCOUNTER — Encounter: Payer: Self-pay | Admitting: Internal Medicine

## 2016-03-05 NOTE — Progress Notes (Signed)
APPT RESCHEDULED TO [facility] APPOINTMENT AND LETTER SENT

## 2016-03-08 ENCOUNTER — Other Ambulatory Visit: Payer: Self-pay

## 2016-03-08 DIAGNOSIS — R945 Abnormal results of liver function studies: Secondary | ICD-10-CM

## 2016-03-08 DIAGNOSIS — R7989 Other specified abnormal findings of blood chemistry: Secondary | ICD-10-CM

## 2016-03-09 DIAGNOSIS — M47812 Spondylosis without myelopathy or radiculopathy, cervical region: Secondary | ICD-10-CM | POA: Diagnosis not present

## 2016-03-09 DIAGNOSIS — G35 Multiple sclerosis: Secondary | ICD-10-CM | POA: Diagnosis not present

## 2016-03-09 DIAGNOSIS — M47814 Spondylosis without myelopathy or radiculopathy, thoracic region: Secondary | ICD-10-CM | POA: Diagnosis not present

## 2016-03-09 DIAGNOSIS — M47892 Other spondylosis, cervical region: Secondary | ICD-10-CM | POA: Diagnosis not present

## 2016-03-23 DIAGNOSIS — Z681 Body mass index (BMI) 19 or less, adult: Secondary | ICD-10-CM | POA: Diagnosis not present

## 2016-03-23 DIAGNOSIS — Z0001 Encounter for general adult medical examination with abnormal findings: Secondary | ICD-10-CM | POA: Diagnosis not present

## 2016-03-23 DIAGNOSIS — Z23 Encounter for immunization: Secondary | ICD-10-CM | POA: Diagnosis not present

## 2016-03-23 DIAGNOSIS — R7309 Other abnormal glucose: Secondary | ICD-10-CM | POA: Diagnosis not present

## 2016-03-23 DIAGNOSIS — E039 Hypothyroidism, unspecified: Secondary | ICD-10-CM | POA: Diagnosis not present

## 2016-03-23 DIAGNOSIS — I10 Essential (primary) hypertension: Secondary | ICD-10-CM | POA: Diagnosis not present

## 2016-04-09 DIAGNOSIS — I10 Essential (primary) hypertension: Secondary | ICD-10-CM | POA: Diagnosis not present

## 2016-04-09 DIAGNOSIS — G35 Multiple sclerosis: Secondary | ICD-10-CM | POA: Diagnosis not present

## 2016-04-09 DIAGNOSIS — R5383 Other fatigue: Secondary | ICD-10-CM | POA: Diagnosis not present

## 2016-04-09 DIAGNOSIS — R252 Cramp and spasm: Secondary | ICD-10-CM | POA: Diagnosis not present

## 2016-04-09 DIAGNOSIS — D860 Sarcoidosis of lung: Secondary | ICD-10-CM | POA: Diagnosis not present

## 2016-04-09 DIAGNOSIS — M549 Dorsalgia, unspecified: Secondary | ICD-10-CM | POA: Diagnosis not present

## 2016-04-09 DIAGNOSIS — K219 Gastro-esophageal reflux disease without esophagitis: Secondary | ICD-10-CM | POA: Diagnosis not present

## 2016-04-09 DIAGNOSIS — Z79899 Other long term (current) drug therapy: Secondary | ICD-10-CM | POA: Diagnosis not present

## 2016-04-15 DIAGNOSIS — R7989 Other specified abnormal findings of blood chemistry: Secondary | ICD-10-CM | POA: Diagnosis not present

## 2016-04-15 DIAGNOSIS — R799 Abnormal finding of blood chemistry, unspecified: Secondary | ICD-10-CM | POA: Diagnosis not present

## 2016-04-16 LAB — HEPATIC FUNCTION PANEL
ALBUMIN: 4 g/dL (ref 3.6–5.1)
ALK PHOS: 64 U/L (ref 40–115)
ALT: 72 U/L — AB (ref 9–46)
AST: 45 U/L — AB (ref 10–35)
Bilirubin, Direct: 0.1 mg/dL (ref <=0.2)
Indirect Bilirubin: 0.2 mg/dL (ref 0.2–1.2)
TOTAL PROTEIN: 6.4 g/dL (ref 6.1–8.1)
Total Bilirubin: 0.3 mg/dL (ref 0.2–1.2)

## 2016-04-16 LAB — FERRITIN: Ferritin: 454 ng/mL — ABNORMAL HIGH (ref 20–380)

## 2016-04-20 ENCOUNTER — Encounter: Payer: Self-pay | Admitting: Internal Medicine

## 2016-04-20 ENCOUNTER — Ambulatory Visit (INDEPENDENT_AMBULATORY_CARE_PROVIDER_SITE_OTHER): Payer: Medicare Other | Admitting: Internal Medicine

## 2016-04-20 ENCOUNTER — Other Ambulatory Visit: Payer: Self-pay

## 2016-04-20 VITALS — BP 143/78 | HR 68 | Temp 97.9°F | Ht 75.0 in | Wt 294.6 lb

## 2016-04-20 DIAGNOSIS — R945 Abnormal results of liver function studies: Principal | ICD-10-CM

## 2016-04-20 DIAGNOSIS — K219 Gastro-esophageal reflux disease without esophagitis: Secondary | ICD-10-CM | POA: Diagnosis not present

## 2016-04-20 DIAGNOSIS — R7989 Other specified abnormal findings of blood chemistry: Secondary | ICD-10-CM

## 2016-04-20 NOTE — Patient Instructions (Addendum)
Regular aerobic exercise as feasible (stationary bike probably the best approach)  2-3 cups of coffee daily will help your liver  Repeat ferritin and lfts in 3 months  Continue Protonix 40 mg twice daily

## 2016-04-20 NOTE — Progress Notes (Signed)
Primary Care Physician:  Colette Ribas, MD Primary Gastroenterologist:  Dr. Jena Gauss  Pre-Procedure History & Physical: HPI:  Chad Vasquez is a 55 y.o. male here for follow-up of elevated liver function studies. Previously elevated AST/ALT. He's had an extensive workup. Fatty-appearing liver on ultrasound. Autoimmune, viral markers all negative. Ferritin was up in the 800 range. More recent recheck chemistry indicated a day plummeting ferritin down to 454 AST and ALT now much improved at 45 and 72,  respectively. Markers for HFE hemochromatosis came back negative. Iron saturation only 29%.  EGD with Elease Hashimoto dilation done back in May. LA grade A esophagitis,  Schatzki's ring-dilated. Reflux symptoms well controlled on Protonix 40 mg twice daily. Previously on high-dose steroids and meds for MS. He's come off both of those drugs, and feels better. Currently, on no treatment for MS.  Past Medical History  Diagnosis Date  . Multiple sclerosis (HCC) 2010  . HTN (hypertension)   . Polysubstance abuse 2013  . Anxiety and depression   . Chronic pain   . Coronary artery disease   . Hyperlipidemia   . Sleep apnea     CPAP  . Sarcoidosis (HCC)     2013, ???  . GERD (gastroesophageal reflux disease)   . COPD (chronic obstructive pulmonary disease) (HCC)   . Arthritis   . Hiatal hernia   . Schatzki's ring     mild    Past Surgical History  Procedure Laterality Date  . Mediastinoscopy  05/15/2012    Procedure: MEDIASTINOSCOPY;  Surgeon: Loreli Slot, MD;  Location: [facility] OR;  Service: Thoracic;  Laterality: N/A;  . Cholecystectomy N/A 04/16/2013    Procedure: LAPAROSCOPIC CHOLECYSTECTOMY;  Surgeon: Dalia Heading, MD;  Location: [facility] ORS;  Service: General;  Laterality: N/A;  . Cardiac catheterization  2005  . Colonoscopy  2011    Dr. Jena Gauss: normal  . Esophagogastroduodenoscopy (egd) with propofol N/A 02/16/2016    Dr.Anesa Fronek- LA grade A esophagitis, small hiatal hernia, normal  second portion of the duodenum, mild schatzki ring, no specimens collected.  Elease Hashimoto dilation N/A 02/16/2016    Procedure: Elease Hashimoto DILATION;  Surgeon: Corbin Ade, MD;  Location: [facility] ENDO SUITE;  Service: Endoscopy;  Laterality: N/A;    Prior to Admission medications   Medication Sig Start Date End Date Taking? Authorizing Provider  baclofen (LIORESAL) 20 MG tablet Take 1 tablet (20 mg total) by mouth 3 (three) times daily. 08/09/14  Yes Suanne Marker, MD  calcium carbonate (TUMS - DOSED IN MG ELEMENTAL CALCIUM) 500 MG chewable tablet Chew 1 tablet by mouth daily as needed for indigestion or heartburn. Reported on 02/03/2016   Yes Historical Provider, MD  diazepam (VALIUM) 10 MG tablet Take 10 mg by mouth 4 (four) times daily.   Yes Historical Provider, MD  docusate sodium (COLACE) 100 MG capsule Take 200 mg by mouth 2 (two) times daily as needed for mild constipation. Reported on 02/03/2016   Yes Historical Provider, MD  furosemide (LASIX) 20 MG tablet Take 20 mg by mouth daily as needed for fluid.    Yes Historical Provider, MD  losartan (COZAAR) 100 MG tablet Take 1 tablet by mouth every morning.    Yes Historical Provider, MD  NON FORMULARY Vitamin B12   1500 mcg   One tablet daily   Yes Historical Provider, MD  omeprazole (PRILOSEC) 20 MG capsule Take 20 mg by mouth daily.   Yes Historical Provider, MD  predniSONE (DELTASONE) 10 MG  tablet Take 5 mg by mouth daily with breakfast.    Yes Historical Provider, MD  PROAIR HFA 108 (90 BASE) MCG/ACT inhaler INHALE TWO PUFFS INTO THE LUNGS EVERY SIX HOURS AS NEEDED FOR WHEEZING 04/08/14  Yes Leslye Peer, MD  Probiotic Product (PROBIOTIC DAILY PO) Take 1 capsule by mouth daily.   Yes Historical Provider, MD  traMADol (ULTRAM) 50 MG tablet Take 100 mg by mouth every 6 (six) hours as needed for moderate pain.    Yes Historical Provider, MD  Vitamin D, Ergocalciferol, (DRISDOL) 50000 UNITS CAPS capsule Take 50,000 Units by mouth every 7 (seven) days.  Takes on Wedneday   Yes Historical Provider, MD    Allergies as of 04/20/2016  . (No Known Allergies)    Family History  Problem Relation Age of Onset  . Heart failure Mother   . Stroke Mother   . Heart failure Father   . Breast cancer Sister   . Seizures Brother   . Liver disease Neg Hx   . Breast cancer Sister   . Colon cancer Neg Hx     Social History   Social History  . Marital Status: Single    Spouse Name: N/A  . Number of Children: 0  . Years of Education: HS   Occupational History  . auto technician      disabled    Social History Main Topics  . Smoking status: Former Smoker -- 1.00 packs/day for 30 years    Types: Cigarettes    Quit date: 05/01/2012  . Smokeless tobacco: Never Used     Comment: Quit x 4 years  . Alcohol Use: No     Comment: quit 05/01/2012  . Drug Use: Yes    Special: Cocaine, Marijuana     Comment: 05/01/2012 last use  . Sexual Activity: No   Other Topics Concern  . Not on file   Social History Narrative   Patient lives at home with spouse.   Caffeine Use: 3 cups daily    Review of Systems: See HPI, otherwise negative ROS  Physical Exam: BP 143/78 mmHg  Pulse 68  Temp(Src) 97.9 F (36.6 C) (Oral)  Ht  (1.905 m)  Wt 294 lb 9.6 oz (133.63 kg)  BMI 36.82 kg/m2 General:   Very pleasant, Alert,   well-nourished, pleasant and cooperative in NAD.  Wheelchair-bound. Neck:  Supple; no masses or thyromegaly. No significant cervical adenopathy. Lungs:  Clear throughout to auscultation.   No wheezes, crackles, or rhonchi. No acute distress. Heart:  Regular rate and rhythm; no murmurs, clicks, rubs,  or gallops. Abdomen: Non-distended, normal bowel sounds.  Soft and nontender without appreciable mass or hepatosplenomegaly.  Pulses:  Normal pulses noted. Extremities:  Without clubbing or edema.  Impression:  Patient is a pleasant 55 year old gentleman with a fatty-appearing liver on ultrasound and transaminitis recently. He likely  has an element of baseline NASH. His LFTs have improved as has his ferritin significantly. I am suspicious these parameters became elevated more likely due to drug effect (i.e. MS treatment including high-dose corticosteroid therapy). He's had extensive serological workup for other cause without yielding any other suspects.  It is reassuring his numbers have improved significantly. No need to consider a liver biopsy at this time.  Dysphagia resolved since his esophagus was dilated. GERD symptoms well controlled on Protonix.  Recommendations:  Regular aerobic exercise as feasible (stationary bike probably the best approach)  2-3 cups of coffee is good for the liver and I  would continue this behavior.  Repeat ferritin and lfts in 3 months  Continue Protonix 40 mg twice daily     Notice: This dictation was prepared with Dragon dictation along with smaller phrase technology. Any transcriptional errors that result from this process are unintentional and may not be corrected upon review.

## 2016-04-26 NOTE — Progress Notes (Signed)
Addressed during recent OV with Dr. Jena Gauss.

## 2016-05-05 DIAGNOSIS — M549 Dorsalgia, unspecified: Secondary | ICD-10-CM | POA: Diagnosis not present

## 2016-05-05 DIAGNOSIS — D860 Sarcoidosis of lung: Secondary | ICD-10-CM | POA: Diagnosis not present

## 2016-05-05 DIAGNOSIS — I10 Essential (primary) hypertension: Secondary | ICD-10-CM | POA: Diagnosis not present

## 2016-05-05 DIAGNOSIS — K769 Liver disease, unspecified: Secondary | ICD-10-CM | POA: Diagnosis not present

## 2016-05-05 DIAGNOSIS — K219 Gastro-esophageal reflux disease without esophagitis: Secondary | ICD-10-CM | POA: Diagnosis not present

## 2016-05-05 DIAGNOSIS — R5383 Other fatigue: Secondary | ICD-10-CM | POA: Diagnosis not present

## 2016-05-05 DIAGNOSIS — G35 Multiple sclerosis: Secondary | ICD-10-CM | POA: Diagnosis not present

## 2016-05-05 DIAGNOSIS — Z79899 Other long term (current) drug therapy: Secondary | ICD-10-CM | POA: Diagnosis not present

## 2016-05-05 DIAGNOSIS — R261 Paralytic gait: Secondary | ICD-10-CM | POA: Diagnosis not present

## 2016-05-05 DIAGNOSIS — R252 Cramp and spasm: Secondary | ICD-10-CM | POA: Diagnosis not present

## 2016-05-12 DIAGNOSIS — M130 Polyarthritis, unspecified: Secondary | ICD-10-CM | POA: Diagnosis not present

## 2016-05-12 DIAGNOSIS — G894 Chronic pain syndrome: Secondary | ICD-10-CM | POA: Diagnosis not present

## 2016-05-12 DIAGNOSIS — G35 Multiple sclerosis: Secondary | ICD-10-CM | POA: Diagnosis not present

## 2016-05-12 DIAGNOSIS — Z6836 Body mass index (BMI) 36.0-36.9, adult: Secondary | ICD-10-CM | POA: Diagnosis not present

## 2016-05-18 ENCOUNTER — Ambulatory Visit: Payer: Medicare Other | Admitting: Gastroenterology

## 2016-06-08 DIAGNOSIS — F5101 Primary insomnia: Secondary | ICD-10-CM | POA: Diagnosis not present

## 2016-06-08 DIAGNOSIS — I10 Essential (primary) hypertension: Secondary | ICD-10-CM | POA: Diagnosis not present

## 2016-06-08 DIAGNOSIS — M549 Dorsalgia, unspecified: Secondary | ICD-10-CM | POA: Diagnosis not present

## 2016-06-08 DIAGNOSIS — Z79899 Other long term (current) drug therapy: Secondary | ICD-10-CM | POA: Diagnosis not present

## 2016-06-08 DIAGNOSIS — K219 Gastro-esophageal reflux disease without esophagitis: Secondary | ICD-10-CM | POA: Diagnosis not present

## 2016-06-08 DIAGNOSIS — G35 Multiple sclerosis: Secondary | ICD-10-CM | POA: Diagnosis not present

## 2016-06-08 DIAGNOSIS — F5111 Primary hypersomnia: Secondary | ICD-10-CM | POA: Diagnosis not present

## 2016-06-08 DIAGNOSIS — R252 Cramp and spasm: Secondary | ICD-10-CM | POA: Diagnosis not present

## 2016-06-08 DIAGNOSIS — D860 Sarcoidosis of lung: Secondary | ICD-10-CM | POA: Diagnosis not present

## 2016-06-08 DIAGNOSIS — R5383 Other fatigue: Secondary | ICD-10-CM | POA: Diagnosis not present

## 2016-06-23 ENCOUNTER — Other Ambulatory Visit: Payer: Self-pay

## 2016-06-23 DIAGNOSIS — R945 Abnormal results of liver function studies: Principal | ICD-10-CM

## 2016-06-23 DIAGNOSIS — R7989 Other specified abnormal findings of blood chemistry: Secondary | ICD-10-CM

## 2016-07-16 DIAGNOSIS — Z6835 Body mass index (BMI) 35.0-35.9, adult: Secondary | ICD-10-CM | POA: Diagnosis not present

## 2016-07-16 DIAGNOSIS — D869 Sarcoidosis, unspecified: Secondary | ICD-10-CM | POA: Diagnosis not present

## 2016-07-16 DIAGNOSIS — M2550 Pain in unspecified joint: Secondary | ICD-10-CM | POA: Diagnosis not present

## 2016-07-16 DIAGNOSIS — G8929 Other chronic pain: Secondary | ICD-10-CM | POA: Diagnosis not present

## 2016-07-16 DIAGNOSIS — Z1389 Encounter for screening for other disorder: Secondary | ICD-10-CM | POA: Diagnosis not present

## 2016-07-16 DIAGNOSIS — G35 Multiple sclerosis: Secondary | ICD-10-CM | POA: Diagnosis not present

## 2016-07-16 DIAGNOSIS — F419 Anxiety disorder, unspecified: Secondary | ICD-10-CM | POA: Diagnosis not present

## 2016-07-16 DIAGNOSIS — G894 Chronic pain syndrome: Secondary | ICD-10-CM | POA: Diagnosis not present

## 2016-07-19 ENCOUNTER — Other Ambulatory Visit (HOSPITAL_COMMUNITY): Payer: Self-pay | Admitting: Family Medicine

## 2016-07-19 DIAGNOSIS — N644 Mastodynia: Secondary | ICD-10-CM

## 2016-07-25 IMAGING — CR DG HIP COMPLETE 2+V*R*
3 series · 3 of 3 positions shown · non-contrast
Comparison: None.

CLINICAL DATA: Multiple sclerosis, right hip pain knee pain

EXAM:
RIGHT HIP - COMPLETE 2+ VIEW

[view not recorded (1 of 3)]
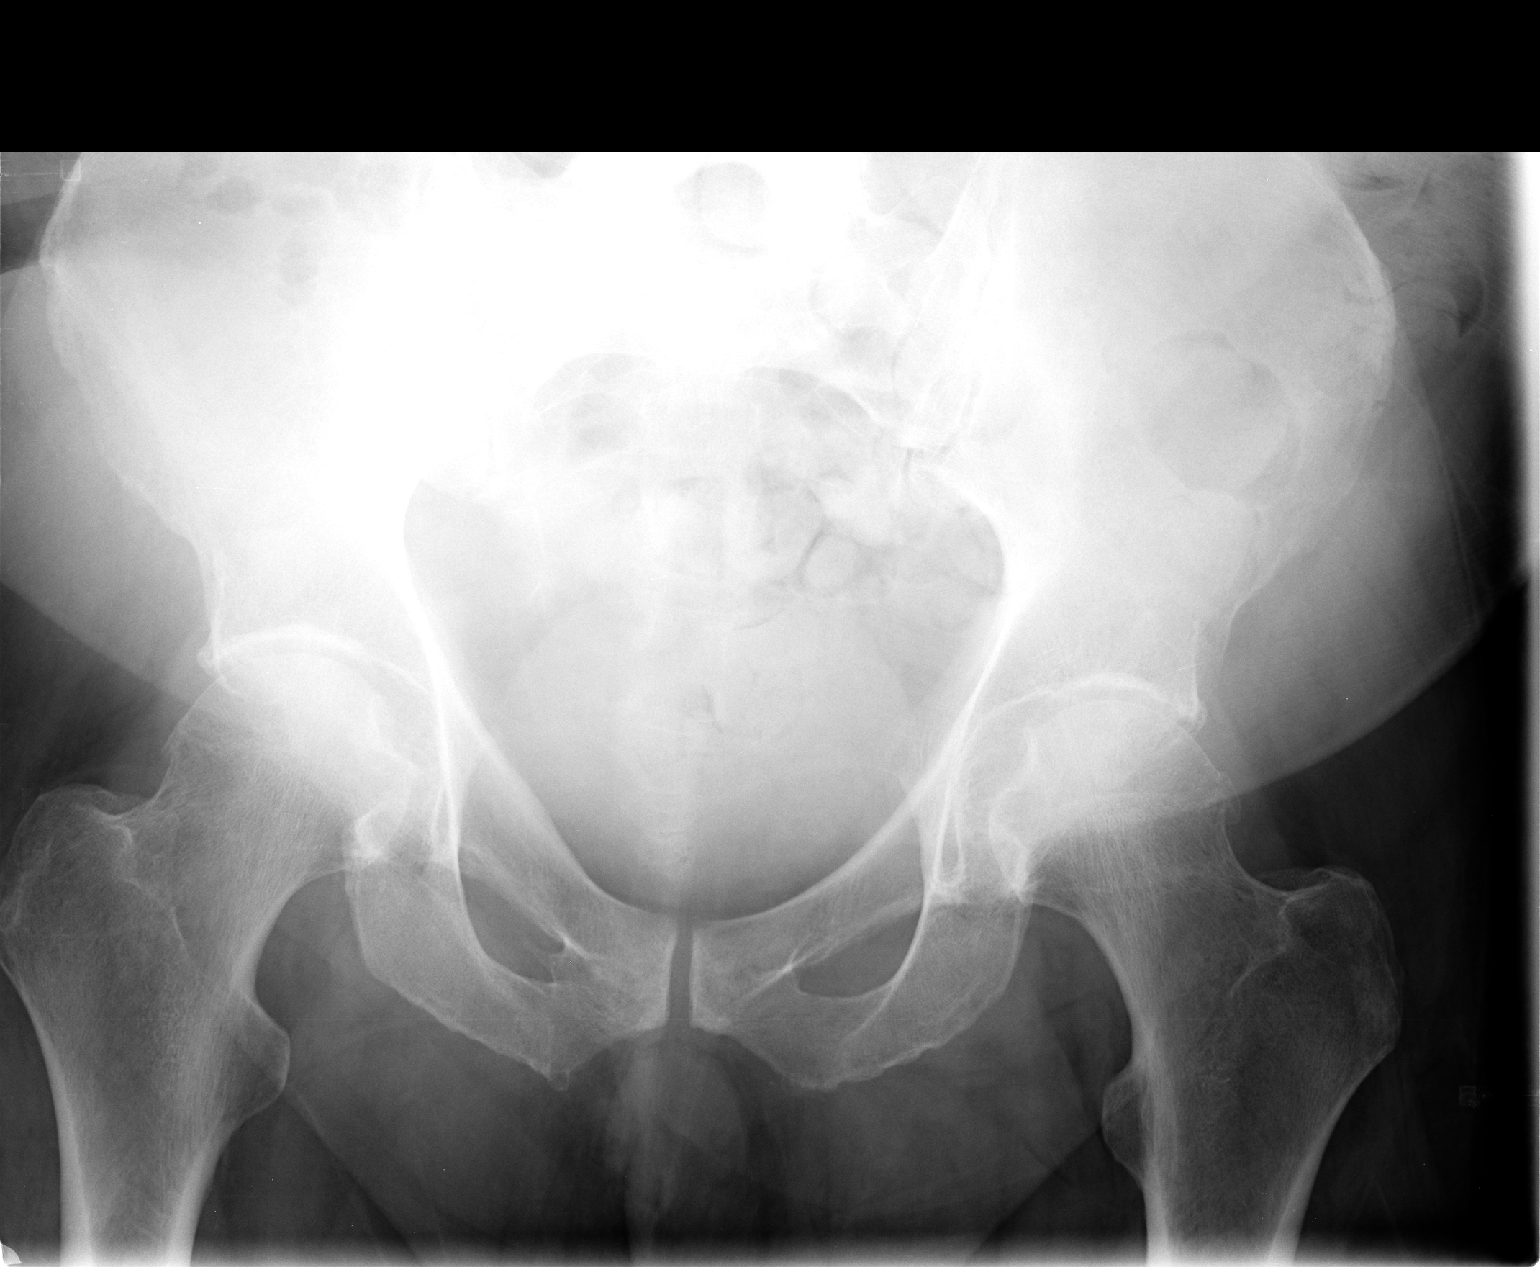

[view not recorded (2 of 3)]
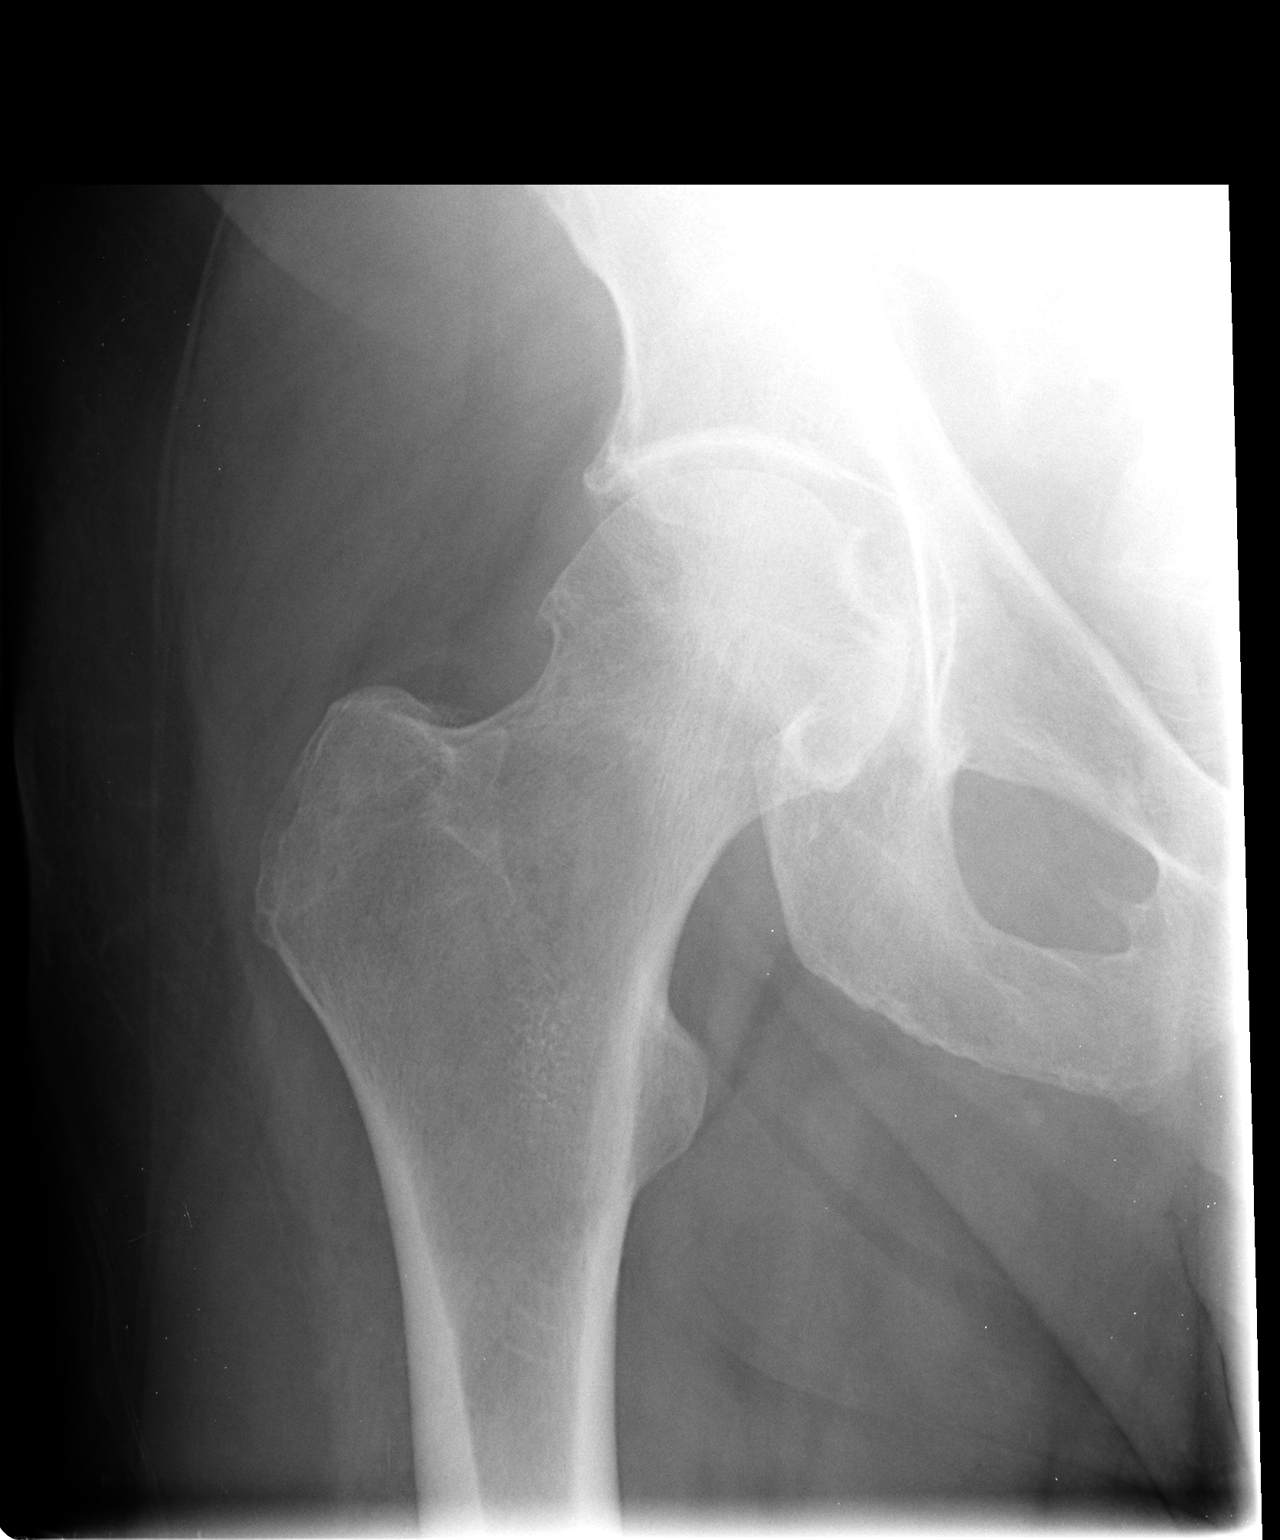

[view not recorded (3 of 3)]
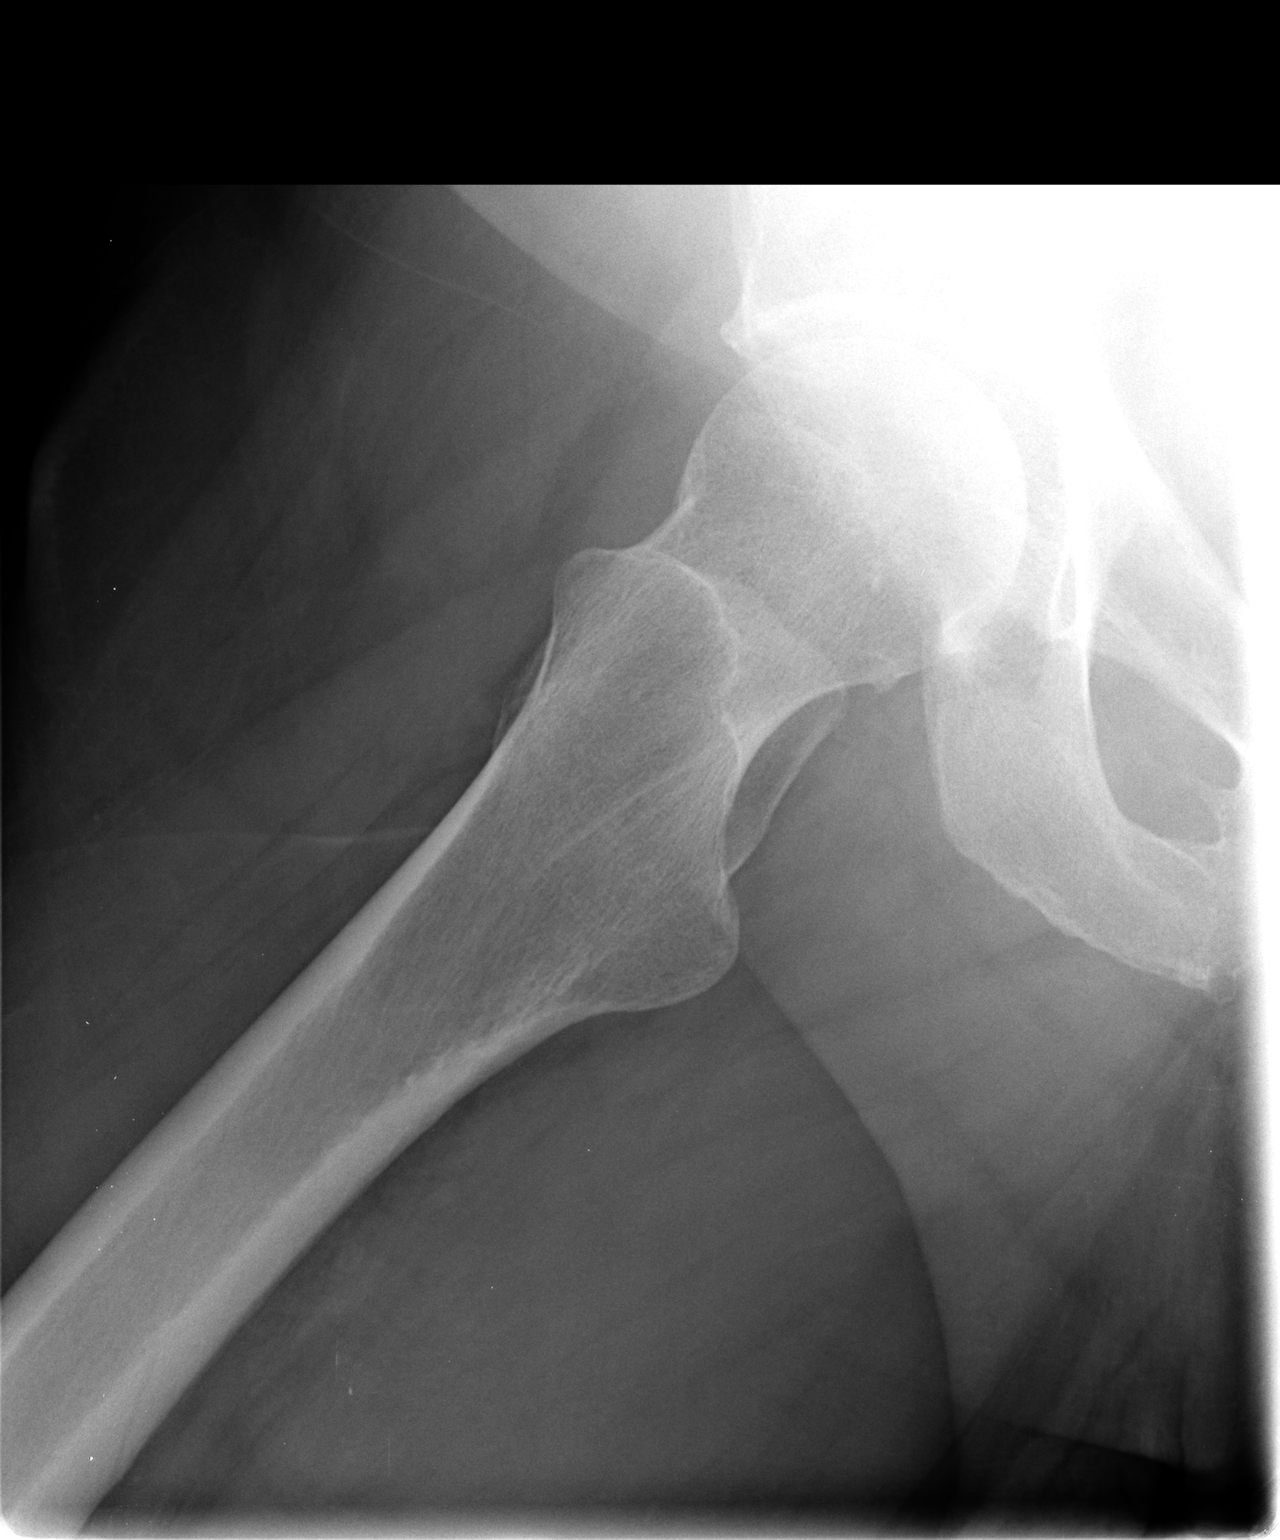

[3 of 3 positions shown; findings below may reference images not displayed]

FINDINGS: Three views of the right hip submitted. No acute fracture or
subluxation. Mild degenerative changes bilateral hip joints with
narrowing of superior hip joint space. Minimal superior acetabular
spurring. Minimal spurring of femoral head.
IMPRESSION: No acute fracture or subluxation. Mild degenerative changes
bilateral hip joints.

## 2016-07-26 ENCOUNTER — Other Ambulatory Visit (HOSPITAL_COMMUNITY): Payer: Self-pay | Admitting: Family Medicine

## 2016-07-26 DIAGNOSIS — N644 Mastodynia: Secondary | ICD-10-CM

## 2016-07-29 DIAGNOSIS — Z23 Encounter for immunization: Secondary | ICD-10-CM | POA: Diagnosis not present

## 2016-07-29 DIAGNOSIS — R7989 Other specified abnormal findings of blood chemistry: Secondary | ICD-10-CM | POA: Diagnosis not present

## 2016-07-30 LAB — HEPATIC FUNCTION PANEL
ALBUMIN: 4.5 g/dL (ref 3.6–5.1)
ALK PHOS: 66 U/L (ref 40–115)
ALT: 21 U/L (ref 9–46)
AST: 21 U/L (ref 10–35)
BILIRUBIN DIRECT: 0.1 mg/dL (ref <=0.2)
BILIRUBIN TOTAL: 0.4 mg/dL (ref 0.2–1.2)
Indirect Bilirubin: 0.3 mg/dL (ref 0.2–1.2)
Total Protein: 6.8 g/dL (ref 6.1–8.1)

## 2016-07-30 LAB — FERRITIN: FERRITIN: 366 ng/mL (ref 20–380)

## 2016-08-09 DIAGNOSIS — G35 Multiple sclerosis: Secondary | ICD-10-CM | POA: Diagnosis not present

## 2016-08-09 DIAGNOSIS — M549 Dorsalgia, unspecified: Secondary | ICD-10-CM | POA: Diagnosis not present

## 2016-08-09 DIAGNOSIS — I10 Essential (primary) hypertension: Secondary | ICD-10-CM | POA: Diagnosis not present

## 2016-08-09 DIAGNOSIS — K219 Gastro-esophageal reflux disease without esophagitis: Secondary | ICD-10-CM | POA: Diagnosis not present

## 2016-08-09 DIAGNOSIS — R252 Cramp and spasm: Secondary | ICD-10-CM | POA: Diagnosis not present

## 2016-08-09 DIAGNOSIS — D860 Sarcoidosis of lung: Secondary | ICD-10-CM | POA: Diagnosis not present

## 2016-08-09 DIAGNOSIS — R261 Paralytic gait: Secondary | ICD-10-CM | POA: Diagnosis not present

## 2016-08-09 DIAGNOSIS — G4719 Other hypersomnia: Secondary | ICD-10-CM | POA: Diagnosis not present

## 2016-08-09 DIAGNOSIS — R5383 Other fatigue: Secondary | ICD-10-CM | POA: Diagnosis not present

## 2016-08-09 DIAGNOSIS — Z79899 Other long term (current) drug therapy: Secondary | ICD-10-CM | POA: Diagnosis not present

## 2016-08-10 ENCOUNTER — Encounter (HOSPITAL_COMMUNITY): Payer: Medicare Other

## 2016-08-11 ENCOUNTER — Encounter (HOSPITAL_COMMUNITY)
Admission: RE | Admit: 2016-08-11 | Discharge: 2016-08-11 | Disposition: A | Discharge status: Home / Self Care | Payer: Medicare Other | Source: Ambulatory Visit | Attending: Neurology | Admitting: Neurology

## 2016-08-11 ENCOUNTER — Encounter (HOSPITAL_COMMUNITY): Payer: Self-pay

## 2016-08-11 DIAGNOSIS — G35 Multiple sclerosis: Secondary | ICD-10-CM | POA: Diagnosis not present

## 2016-08-11 DIAGNOSIS — G9589 Other specified diseases of spinal cord: Secondary | ICD-10-CM | POA: Diagnosis not present

## 2016-08-11 MED ORDER — SODIUM CHLORIDE 0.9 % IV SOLN
INTRAVENOUS | Status: DC
  Administered 2016-08-11: 14:00:00 via INTRAVENOUS

## 2016-08-11 MED ORDER — SODIUM CHLORIDE 0.9 % IV SOLN
1000.0000 mg | Freq: Once | INTRAVENOUS | Status: AC
  Administered 2016-08-11: 1000 mg via INTRAVENOUS
  Filled 2016-08-11: qty 8

## 2016-08-12 ENCOUNTER — Encounter (HOSPITAL_COMMUNITY): Payer: Self-pay

## 2016-08-12 ENCOUNTER — Encounter (HOSPITAL_COMMUNITY)
Admission: RE | Admit: 2016-08-12 | Discharge: 2016-08-12 | Disposition: A | Discharge status: Home / Self Care | Payer: Medicare Other | Source: Ambulatory Visit | Attending: Neurology | Admitting: Neurology

## 2016-08-12 DIAGNOSIS — G35 Multiple sclerosis: Secondary | ICD-10-CM | POA: Diagnosis not present

## 2016-08-12 MED ORDER — SODIUM CHLORIDE 0.9 % IV SOLN
INTRAVENOUS | Status: DC
  Administered 2016-08-12: 14:00:00 via INTRAVENOUS

## 2016-08-12 MED ORDER — METHYLPREDNISOLONE SODIUM SUCC 1000 MG IJ SOLR
1000.0000 mg | Freq: Once | INTRAMUSCULAR | Status: AC
  Administered 2016-08-12: 1000 mg via INTRAVENOUS
  Filled 2016-08-12: qty 8

## 2016-08-13 ENCOUNTER — Encounter (HOSPITAL_COMMUNITY)
Admission: RE | Admit: 2016-08-13 | Discharge: 2016-08-13 | Disposition: A | Discharge status: Home / Self Care | Payer: Medicare Other | Source: Ambulatory Visit | Attending: Neurology | Admitting: Neurology

## 2016-08-13 ENCOUNTER — Telehealth: Payer: Self-pay

## 2016-08-13 DIAGNOSIS — G35 Multiple sclerosis: Secondary | ICD-10-CM | POA: Diagnosis not present

## 2016-08-13 MED ORDER — SODIUM CHLORIDE 0.9 % IV SOLN
1000.0000 mg | Freq: Every day | INTRAVENOUS | Status: DC
  Administered 2016-08-13: 1000 mg via INTRAVENOUS
  Filled 2016-08-13: qty 8

## 2016-08-13 MED ORDER — SODIUM CHLORIDE 0.9 % IV SOLN
Freq: Once | INTRAVENOUS | Status: AC
  Administered 2016-08-13: 13:00:00 via INTRAVENOUS

## 2016-08-13 NOTE — Telephone Encounter (Signed)
Pt called to say thank you to RMR for everything he did to help him.

## 2016-08-13 NOTE — Telephone Encounter (Signed)
Communication noted.  

## 2016-08-17 ENCOUNTER — Telehealth (HOSPITAL_COMMUNITY): Payer: Self-pay | Admitting: Physical Therapy

## 2016-08-17 NOTE — Telephone Encounter (Signed)
Pt caled to see if Dr. Christella Hartiganoonque had send in a referral for him. There is not referral at this time, pt will call MD and request that they fax us a referral today. NF

## 2016-08-23 DIAGNOSIS — G35 Multiple sclerosis: Secondary | ICD-10-CM | POA: Diagnosis not present

## 2016-10-11 DIAGNOSIS — I10 Essential (primary) hypertension: Secondary | ICD-10-CM | POA: Diagnosis not present

## 2016-10-11 DIAGNOSIS — K219 Gastro-esophageal reflux disease without esophagitis: Secondary | ICD-10-CM | POA: Diagnosis not present

## 2016-10-11 DIAGNOSIS — Z79899 Other long term (current) drug therapy: Secondary | ICD-10-CM | POA: Diagnosis not present

## 2016-10-11 DIAGNOSIS — R5383 Other fatigue: Secondary | ICD-10-CM | POA: Diagnosis not present

## 2016-10-11 DIAGNOSIS — R252 Cramp and spasm: Secondary | ICD-10-CM | POA: Diagnosis not present

## 2016-10-11 DIAGNOSIS — D860 Sarcoidosis of lung: Secondary | ICD-10-CM | POA: Diagnosis not present

## 2016-10-11 DIAGNOSIS — M545 Low back pain: Secondary | ICD-10-CM | POA: Diagnosis not present

## 2016-10-11 DIAGNOSIS — K5903 Drug induced constipation: Secondary | ICD-10-CM | POA: Diagnosis not present

## 2016-10-11 DIAGNOSIS — G35 Multiple sclerosis: Secondary | ICD-10-CM | POA: Diagnosis not present

## 2016-10-11 DIAGNOSIS — M549 Dorsalgia, unspecified: Secondary | ICD-10-CM | POA: Diagnosis not present

## 2016-10-12 DIAGNOSIS — M722 Plantar fascial fibromatosis: Secondary | ICD-10-CM | POA: Diagnosis not present

## 2016-10-15 ENCOUNTER — Ambulatory Visit
Admission: RE | Admit: 2016-10-15 | Discharge: 2016-10-15 | Disposition: A | Discharge status: Home / Self Care | Payer: Medicare Other | Source: Ambulatory Visit | Attending: Family Medicine | Admitting: Family Medicine

## 2016-10-15 ENCOUNTER — Other Ambulatory Visit: Payer: Medicare Other

## 2016-10-15 DIAGNOSIS — N644 Mastodynia: Secondary | ICD-10-CM

## 2016-10-15 DIAGNOSIS — N62 Hypertrophy of breast: Secondary | ICD-10-CM | POA: Diagnosis not present

## 2016-10-27 DIAGNOSIS — Z7409 Other reduced mobility: Secondary | ICD-10-CM | POA: Diagnosis not present

## 2016-10-27 DIAGNOSIS — R531 Weakness: Secondary | ICD-10-CM | POA: Diagnosis not present

## 2016-10-27 DIAGNOSIS — Q232 Congenital mitral stenosis: Secondary | ICD-10-CM | POA: Diagnosis not present

## 2016-10-27 DIAGNOSIS — G35 Multiple sclerosis: Secondary | ICD-10-CM | POA: Diagnosis not present

## 2016-12-01 DIAGNOSIS — G35 Multiple sclerosis: Secondary | ICD-10-CM | POA: Diagnosis not present

## 2016-12-01 DIAGNOSIS — K5903 Drug induced constipation: Secondary | ICD-10-CM | POA: Diagnosis not present

## 2016-12-01 DIAGNOSIS — R252 Cramp and spasm: Secondary | ICD-10-CM | POA: Diagnosis not present

## 2016-12-01 DIAGNOSIS — M545 Low back pain: Secondary | ICD-10-CM | POA: Diagnosis not present

## 2017-02-02 DIAGNOSIS — R252 Cramp and spasm: Secondary | ICD-10-CM | POA: Diagnosis not present

## 2017-02-02 DIAGNOSIS — G35 Multiple sclerosis: Secondary | ICD-10-CM | POA: Diagnosis not present

## 2017-02-02 DIAGNOSIS — M545 Low back pain: Secondary | ICD-10-CM | POA: Diagnosis not present

## 2017-02-02 DIAGNOSIS — Z79899 Other long term (current) drug therapy: Secondary | ICD-10-CM | POA: Diagnosis not present

## 2017-04-27 ENCOUNTER — Encounter (HOSPITAL_COMMUNITY)
Admission: RE | Admit: 2017-04-27 | Discharge: 2017-04-27 | Disposition: A | Discharge status: Home / Self Care | Payer: Medicare Other | Source: Ambulatory Visit | Attending: Neurology | Admitting: Neurology

## 2017-04-27 DIAGNOSIS — G35 Multiple sclerosis: Secondary | ICD-10-CM | POA: Insufficient documentation

## 2017-04-27 DIAGNOSIS — R252 Cramp and spasm: Secondary | ICD-10-CM | POA: Diagnosis not present

## 2017-04-27 DIAGNOSIS — Z79899 Other long term (current) drug therapy: Secondary | ICD-10-CM | POA: Diagnosis not present

## 2017-04-27 DIAGNOSIS — M545 Low back pain: Secondary | ICD-10-CM | POA: Diagnosis not present

## 2017-04-27 MED ORDER — SODIUM CHLORIDE 0.9 % IV SOLN
1000.0000 mg | Freq: Once | INTRAVENOUS | Status: AC
Start: 2017-04-27 — End: 2017-04-27
  Administered 2017-04-27: 1000 mg via INTRAVENOUS
  Filled 2017-04-27: qty 8

## 2017-04-27 MED ORDER — SODIUM CHLORIDE 0.9 % IV SOLN
INTRAVENOUS | Status: DC
  Administered 2017-04-27: 250 mL via INTRAVENOUS

## 2017-04-28 ENCOUNTER — Encounter (HOSPITAL_COMMUNITY): Admission: RE | Admit: 2017-04-28 | Payer: Medicare Other | Source: Ambulatory Visit

## 2017-04-29 ENCOUNTER — Emergency Department (HOSPITAL_COMMUNITY)
Admission: EM | Admit: 2017-04-29 | Discharge: 2017-04-29 | Disposition: A | Discharge status: Home / Self Care | Payer: Medicare Other | Attending: Emergency Medicine | Admitting: Emergency Medicine

## 2017-04-29 ENCOUNTER — Encounter (HOSPITAL_COMMUNITY)
Admission: RE | Admit: 2017-04-29 | Discharge: 2017-04-29 | Disposition: A | Discharge status: Home / Self Care | Payer: Medicare Other | Source: Ambulatory Visit | Attending: Neurology | Admitting: Neurology

## 2017-04-29 ENCOUNTER — Emergency Department (HOSPITAL_COMMUNITY): Payer: Medicare Other

## 2017-04-29 ENCOUNTER — Encounter: Payer: Self-pay | Admitting: Cardiology

## 2017-04-29 ENCOUNTER — Encounter (HOSPITAL_COMMUNITY): Payer: Self-pay | Admitting: *Deleted

## 2017-04-29 ENCOUNTER — Encounter (HOSPITAL_COMMUNITY): Payer: Medicare Other

## 2017-04-29 ENCOUNTER — Other Ambulatory Visit: Payer: Self-pay

## 2017-04-29 DIAGNOSIS — I2510 Atherosclerotic heart disease of native coronary artery without angina pectoris: Secondary | ICD-10-CM | POA: Insufficient documentation

## 2017-04-29 DIAGNOSIS — J449 Chronic obstructive pulmonary disease, unspecified: Secondary | ICD-10-CM | POA: Insufficient documentation

## 2017-04-29 DIAGNOSIS — I10 Essential (primary) hypertension: Secondary | ICD-10-CM | POA: Diagnosis not present

## 2017-04-29 DIAGNOSIS — R001 Bradycardia, unspecified: Secondary | ICD-10-CM | POA: Diagnosis not present

## 2017-04-29 DIAGNOSIS — Z87891 Personal history of nicotine dependence: Secondary | ICD-10-CM | POA: Insufficient documentation

## 2017-04-29 DIAGNOSIS — Z79899 Other long term (current) drug therapy: Secondary | ICD-10-CM | POA: Insufficient documentation

## 2017-04-29 DIAGNOSIS — G35 Multiple sclerosis: Secondary | ICD-10-CM | POA: Diagnosis not present

## 2017-04-29 LAB — COMPREHENSIVE METABOLIC PANEL
ALBUMIN: 4.8 g/dL (ref 3.5–5.0)
ALT: 34 U/L (ref 17–63)
ANION GAP: 8 (ref 5–15)
AST: 30 U/L (ref 15–41)
Alkaline Phosphatase: 72 U/L (ref 38–126)
BUN: 19 mg/dL (ref 6–20)
CALCIUM: 9.8 mg/dL (ref 8.9–10.3)
CO2: 27 mmol/L (ref 22–32)
Chloride: 108 mmol/L (ref 101–111)
Creatinine, Ser: 0.96 mg/dL (ref 0.61–1.24)
GFR calc Af Amer: >60 mL/min (ref >60)
GFR calc non Af Amer: >60 mL/min (ref >60)
GLUCOSE: 110 mg/dL — AB (ref 65–99)
Potassium: 3.9 mmol/L (ref 3.5–5.1)
SODIUM: 143 mmol/L (ref 135–145)
TOTAL PROTEIN: 7.4 g/dL (ref 6.5–8.1)
Total Bilirubin: 0.9 mg/dL (ref 0.3–1.2)

## 2017-04-29 LAB — CBC WITH DIFFERENTIAL/PLATELET
BASOS PCT: 0 %
Basophils Absolute: 0 10*3/uL (ref 0.0–0.1)
EOS ABS: 0 10*3/uL (ref 0.0–0.7)
EOS PCT: 0 %
HCT: 40.9 % (ref 39.0–52.0)
HEMOGLOBIN: 14 g/dL (ref 13.0–17.0)
Lymphocytes Relative: 13 %
Lymphs Abs: 1.3 10*3/uL (ref 0.7–4.0)
MCH: 30.4 pg (ref 26.0–34.0)
MCHC: 34.2 g/dL (ref 30.0–36.0)
MCV: 88.7 fL (ref 78.0–100.0)
MONO ABS: 0.2 10*3/uL (ref 0.1–1.0)
MONOS PCT: 2 %
NEUTROS PCT: 85 %
Neutro Abs: 8.4 10*3/uL — ABNORMAL HIGH (ref 1.7–7.7)
PLATELETS: 212 10*3/uL (ref 150–400)
RBC: 4.61 MIL/uL (ref 4.22–5.81)
RDW: 13.9 % (ref 11.5–15.5)
WBC: 10 10*3/uL (ref 4.0–10.5)

## 2017-04-29 LAB — TSH: TSH: 1.018 u[IU]/mL (ref 0.350–4.500)

## 2017-04-29 LAB — MAGNESIUM: Magnesium: 2.3 mg/dL (ref 1.7–2.4)

## 2017-04-29 MED ORDER — SODIUM CHLORIDE 0.9 % IV SOLN
INTRAVENOUS | Status: DC
  Administered 2017-04-29: 13:00:00 via INTRAVENOUS

## 2017-04-29 MED ORDER — SODIUM CHLORIDE 0.9 % IV SOLN
1000.0000 mg | Freq: Once | INTRAVENOUS | Status: AC
  Administered 2017-04-29: 1000 mg via INTRAVENOUS
  Filled 2017-04-29: qty 8

## 2017-04-29 MED ORDER — SODIUM CHLORIDE 0.9 % IV BOLUS (SEPSIS)
1000.0000 mL | Freq: Once | INTRAVENOUS | Status: AC
  Administered 2017-04-29: 1000 mL via INTRAVENOUS

## 2017-04-29 NOTE — Progress Notes (Signed)
Pt here for Solumedrol infusion for treatment of exacerbation of MS symptoms. Dr. Gerilyn Pilgrim notified that pt was found to be in junctional bradycardia.Pt transferred to ED via wcto register for assessment of new onset junctional bradycardia. Philmore Pali, RN called to update.

## 2017-04-29 NOTE — ED Triage Notes (Signed)
Pt was over in Short Stay today getting a solu-medrol infusion for his MS. Pt's HR was found to be low at 44bpm so EKG was performed. Pt denies SOB, CP, weakness, dizziness. Pt already has IV established in right hand by Short Stay.

## 2017-04-29 NOTE — ED Notes (Signed)
Pt wheeled to car and placed into car. Pt verbalized understanding of discharge instructions.

## 2017-04-29 NOTE — Discharge Instructions (Signed)
Your tests were good today. I discussed your care with a cardiologist on-call. Recommend follow-up with cardiologist next week for a Holter monitor. Return to the ED if your pulse falls below 40.

## 2017-04-29 NOTE — ED Provider Notes (Signed)
[facility] Provider Note   CSN: [id number] Arrival date & time: 04/29/17  1502     History   Chief Complaint Chief Complaint  Patient presents with  . Bradycardia    HPI Chad Vasquez is a 56 y.o. male.  Patient was in the outpatient department today to get an  intravenous dose of steroids for his MS.  Vital signs noted a pulse in the low 40's.  Patient was sent to the emergency department. No chest pain, dyspnea, known history of bradycardia, diaphoresis, nausea. Past medical history well-documented. He states he feels fine. He was unaware of his bradycardia.      Past Medical History:  Diagnosis Date  . Anxiety and depression   . Arthritis   . Chronic pain   . COPD (chronic obstructive pulmonary disease) (HCC)   . Coronary artery disease   . GERD (gastroesophageal reflux disease)   . Hiatal hernia   . HTN (hypertension)   . Hyperlipidemia   . Multiple sclerosis (HCC) 2010  . Polysubstance abuse 2013  . Sarcoidosis    2013, ???  . Schatzki's ring    mild  . Sleep apnea    CPAP    Patient Active Problem List   Diagnosis Date Noted  . Schatzki's ring   . Hiatal hernia   . Reflux esophagitis   . Dysphagia   . Abnormal LFTs 02/03/2016  . Esophageal dysphagia 02/03/2016  . Hyperlipidemia 08/13/2013  . Venous insufficiency 08/13/2013  . CAD (coronary artery disease) 08/13/2013  . MS (multiple sclerosis) (HCC) 08/03/2013  . Spasticity 08/03/2013  . Obstructive sleep apnea 11/29/2012  . Sarcoidosis 05/19/2012  . Paraplegia (HCC) 05/19/2012  . Multiple sclerosis exacerbation (HCC) 05/07/2012  . Fever 05/07/2012  . HTN (hypertension) 05/07/2012  . COPD (chronic obstructive pulmonary disease) (HCC) 05/07/2012  . Chronic pain 05/07/2012  . Polysubstance abuse 05/07/2012  . Lower extremity pain 05/07/2012    Past Surgical History:  Procedure Laterality Date  . CARDIAC CATHETERIZATION  2005  . CHOLECYSTECTOMY N/A 04/16/2013   Procedure:  LAPAROSCOPIC CHOLECYSTECTOMY;  Surgeon: Dalia Heading, MD;  Location: [facility] ORS;  Service: General;  Laterality: N/A;  . COLONOSCOPY  2011   Dr. Jena Gauss: normal  . ESOPHAGOGASTRODUODENOSCOPY (EGD) WITH PROPOFOL N/A 02/16/2016   Dr.Rourk- LA grade A esophagitis, small hiatal hernia, normal second portion of the duodenum, mild schatzki ring, no specimens collected.  Marland Kitchen MALONEY DILATION N/A 02/16/2016   Procedure: Elease Hashimoto DILATION;  Surgeon: Corbin Ade, MD;  Location: [facility];  Service: Endoscopy;  Laterality: N/A;  . MEDIASTINOSCOPY  05/15/2012   Procedure: MEDIASTINOSCOPY;  Surgeon: Loreli Slot, MD;  Location: [facility] OR;  Service: Thoracic;  Laterality: N/A;       Home Medications    Prior to Admission medications   Medication Sig Start Date End Date Taking? Authorizing Provider  baclofen (LIORESAL) 20 MG tablet Take 1 tablet (20 mg total) by mouth 3 (three) times daily. 08/09/14  Yes Penumalli, Glenford Bayley, MD  Biotin 84132 MCG TABS Take 50,000 mcg by mouth daily.   Yes [provider]  calcium carbonate (TUMS - DOSED IN MG ELEMENTAL CALCIUM) 500 MG chewable tablet Chew 1 tablet by mouth daily as needed for indigestion or heartburn. Reported on 02/03/2016   Yes [provider]  diazepam (VALIUM) 10 MG tablet Take 10 mg by mouth 4 (four) times daily.   Yes [provider]  docusate sodium (COLACE) 100 MG capsule Take 200 mg by mouth  2 (two) times daily as needed for mild constipation. Reported on 02/03/2016   Yes [provider]  losartan (COZAAR) 100 MG tablet Take 1 tablet by mouth every morning.    Yes [provider]  PROAIR HFA 108 (90 BASE) MCG/ACT inhaler INHALE TWO PUFFS INTO THE LUNGS EVERY SIX HOURS AS NEEDED FOR WHEEZING 04/08/14  Yes Byrum, Les Pou, MD  Probiotic Product (PROBIOTIC DAILY PO) Take 1 capsule by mouth daily.   Yes [provider]  traMADol (ULTRAM) 50 MG tablet Take 100 mg by mouth every 6 (six) hours as needed for  moderate pain.    Yes [provider]  vitamin B-12 (CYANOCOBALAMIN) 500 MCG tablet Take 1,500 mcg by mouth daily.   Yes [provider]  Vitamin D, Ergocalciferol, (DRISDOL) 50000 UNITS CAPS capsule Take 50,000 Units by mouth every 7 (seven) days. Takes on Wedneday    [provider]    Family History Family History  Problem Relation Age of Onset  . Heart failure Mother   . Stroke Mother   . Heart failure Father   . Breast cancer Sister   . Seizures Brother   . Breast cancer Sister   . Liver disease Neg Hx   . Colon cancer Neg Hx     Social History Social History  Substance Use Topics  . Smoking status: Former Smoker    Packs/day: 1.00    Years: 30.00    Types: Cigarettes    Quit date: 05/01/2012  . Smokeless tobacco: Never Used     Comment: Quit x 4 years  . Alcohol use No     Comment: quit 05/01/2012     Allergies   Patient has no known allergies.   Review of Systems Review of Systems  All other systems reviewed and are negative.    Physical Exam Updated Vital Signs BP 127/66   Pulse (!) 56   Temp (!) 97.5 F (36.4 C) (Oral)   Resp 13   Ht 6\' 4"  (1.93 m)   Wt 72.6 kg (160 lb)   SpO2 100%   BMI 19.48 kg/m   Physical Exam  Constitutional: He is oriented to person, place, and time. He appears well-developed and well-nourished.  HENT:  Head: Normocephalic and atraumatic.  Eyes: Conjunctivae are normal.  Neck: Neck supple.  Cardiovascular: Regular rhythm.   Bradycardic.  Pulmonary/Chest: Effort normal and breath sounds normal.  Abdominal: Soft. Bowel sounds are normal.  Musculoskeletal: Normal range of motion.  Neurological: He is alert and oriented to person, place, and time.  Skin: Skin is warm and dry.  Psychiatric: He has a normal mood and affect. His behavior is normal.  Nursing note and vitals reviewed.    ED Treatments / Results  Labs (all labs ordered are listed, but only abnormal results are displayed) Labs  Reviewed  CBC WITH DIFFERENTIAL/PLATELET - Abnormal; Notable for the following:       Result Value   Neutro Abs 8.4 (*)    All other components within normal limits  COMPREHENSIVE METABOLIC PANEL - Abnormal; Notable for the following:    Glucose, Bld 110 (*)    All other components within normal limits  MAGNESIUM  TSH    EKG  EKG Interpretation  Date/Time:  Friday April 29 2017 15:20:40 EDT Ventricular Rate:  48 PR Interval:    QRS Duration: 107 QT Interval:  456 QTC Calculation: 408 R Axis:   18 Text Interpretation:  Junctional rhythm Low voltage, extremity and precordial  leads Confirmed by Donnetta Hutching [phone]) on 04/29/2017 3:30:16 PM Also confirmed by Donnetta Hutching [phone])  on 04/29/2017 3:31:41 PM       Radiology Dg Chest Port 1 View  Result Date: 04/29/2017 CLINICAL DATA:  Bradycardia EXAM: PORTABLE CHEST 1 VIEW COMPARISON:  07/26/2013, 04/15/2013 FINDINGS: Small calcified right upper lobe nodule, likely a granuloma. No acute infiltrate or effusion. Normal cardiomediastinal silhouette. No pneumothorax. Chronic deformity of left AC joint IMPRESSION: No active disease. Electronically Signed   By: Jasmine Pang M.D.   On: 04/29/2017 16:08    Procedures Procedures (including critical care time)  Medications Ordered in ED Medications  sodium chloride 0.9 % bolus 1,000 mL (0 mLs Intravenous Stopped 04/29/17 1738)     Initial Impression / Assessment and Plan / ED Course  I have reviewed the triage vital signs and the nursing notes.  Pertinent labs & imaging results that were available during my care of the patient were reviewed by me and considered in my medical decision making (see chart for details).     Patient is bradycardic. He has no somatic complaints. Workup including labs and chest x-ray were normal. I discussed his care with the cardiologist on call. He felt was appropriate to work him up as an outpatient with a Holter monitor. This was discussed with the patient and  his wife.  Final Clinical Impressions(s) / ED Diagnoses   Final diagnoses:  Bradycardia    New Prescriptions New Prescriptions   No medications on file     Donnetta Hutching, MD 04/29/17 [phone]

## 2017-04-30 ENCOUNTER — Encounter (HOSPITAL_COMMUNITY)
Admission: RE | Admit: 2017-04-30 | Discharge: 2017-04-30 | Disposition: A | Discharge status: Home / Self Care | Payer: Medicare Other | Source: Ambulatory Visit | Attending: Neurology | Admitting: Neurology

## 2017-04-30 DIAGNOSIS — G35 Multiple sclerosis: Secondary | ICD-10-CM | POA: Diagnosis not present

## 2017-04-30 MED ORDER — SODIUM CHLORIDE 0.9 % IV SOLN
1000.0000 mg | Freq: Once | INTRAVENOUS | Status: AC
  Administered 2017-04-30: 1000 mg via INTRAVENOUS
  Filled 2017-04-30: qty 8

## 2017-05-10 NOTE — Progress Notes (Signed)
Cardiology Office Note   Date:  05/11/2017   ID:  Chad Vasquez, DOB 1961/01/15, MRN [id number]  PCP:  Assunta Found, MD  Cardiologist:   Rollene Rotunda, MD  Referring:  ED  Chief Complaint  Patient presents with  . Bradycardia      History of Present Illness: Chad Vasquez is a 56 y.o. male who is referred by ED for evaluation of bradycardia  .  He was in the ED recently for evaluation of bradycardia.   He was not felt to require any in patient work up.  He was asymptomatic with this.    He had non obstructive CAD with cath in 2005.  He had a negative stress perfusion study in 2012.  He was seen by Dr. Tresa Endo last in 2014.    He reports that his heart rate was low but he is not having any symptoms is presyncope or syncope. He doesn't have any palpitations. He thought it was related to steroids he was getting his multiple sclerosis. His heart rate has come back up again at home watches it ends in the 60s. He gets around in a wheelchair but can stand pivot. He does activity such as standing his heart rate will go into the 80s or 90s appropriately. He denies any shortness of breath, PND or orthopnea. He doesn't have any chest pressure, neck or arm discomfort. He does have some mild chronic lower extremity edema.      Past Medical History:  Diagnosis Date  . Anxiety and depression   . Arthritis   . Chronic pain   . COPD (chronic obstructive pulmonary disease) (HCC)   . Coronary artery disease   . GERD (gastroesophageal reflux disease)   . Hiatal hernia   . HTN (hypertension)   . Hyperlipidemia   . Multiple sclerosis (HCC) 2010  . Polysubstance abuse 2013  . Sarcoidosis    2013, ???  . Schatzki's ring    mild  . Sleep apnea    CPAP    Past Surgical History:  Procedure Laterality Date  . CARDIAC CATHETERIZATION  2005  . CHOLECYSTECTOMY N/A 04/16/2013   Procedure: LAPAROSCOPIC CHOLECYSTECTOMY;  Surgeon: Dalia Heading, MD;  Location: [facility];  Service: General;   Laterality: N/A;  . COLONOSCOPY  2011   Dr. Jena Gauss: normal  . ESOPHAGOGASTRODUODENOSCOPY (EGD) WITH PROPOFOL N/A 02/16/2016   Dr.Rourk- LA grade A esophagitis, small hiatal hernia, normal second portion of the duodenum, mild schatzki ring, no specimens collected.  Marland Kitchen MALONEY DILATION N/A 02/16/2016   Procedure: Elease Hashimoto DILATION;  Surgeon: Corbin Ade, MD;  Location: [facility] SUITE;  Service: Endoscopy;  Laterality: N/A;  . MEDIASTINOSCOPY  05/15/2012   Procedure: MEDIASTINOSCOPY;  Surgeon: Loreli Slot, MD;  Location: [facility] OR;  Service: Thoracic;  Laterality: N/A;     Current Outpatient Prescriptions  Medication Sig Dispense Refill  . baclofen (LIORESAL) 20 MG tablet Take 1 tablet (20 mg total) by mouth 3 (three) times daily. 90 each 12  . Biotin 40981 MCG TABS Take 50,000 mcg by mouth daily.    . calcium carbonate (TUMS - DOSED IN MG ELEMENTAL CALCIUM) 500 MG chewable tablet Chew 1 tablet by mouth daily as needed for indigestion or heartburn. Reported on 02/03/2016    . diazepam (VALIUM) 10 MG tablet Take 10 mg by mouth 4 (four) times daily.    Marland Kitchen docusate sodium (COLACE) 100 MG capsule Take 200 mg by mouth 2 (two) times daily as needed for  mild constipation. Reported on 02/03/2016    . losartan (COZAAR) 100 MG tablet Take 1 tablet by mouth every morning.     Marland Kitchen PROAIR HFA 108 (90 BASE) MCG/ACT inhaler INHALE TWO PUFFS INTO THE LUNGS EVERY SIX HOURS AS NEEDED FOR WHEEZING 8.5 g 3  . Probiotic Product (PROBIOTIC DAILY PO) Take 1 capsule by mouth daily.    . traMADol (ULTRAM) 50 MG tablet Take 100 mg by mouth every 6 (six) hours as needed for moderate pain.     . vitamin B-12 (CYANOCOBALAMIN) 500 MCG tablet Take 1,500 mcg by mouth daily.    . Vitamin D, Ergocalciferol, (DRISDOL) 50000 UNITS CAPS capsule Take 50,000 Units by mouth every 7 (seven) days. Takes on Wedneday     No current facility-administered medications for this visit.     Allergies:   Patient has no known allergies.     Social History:  The patient  reports that he quit smoking about 5 years ago. His smoking use included Cigarettes. He has a 30.00 pack-year smoking history. He has never used smokeless tobacco. He reports that he does not drink alcohol or use drugs.   Family History:  The patient's family history includes Breast cancer in his sister and sister; Heart attack (age of onset: 24) in his mother; Heart attack (age of onset: 61) in his father; Heart failure in his father and mother; Seizures in his brother; Stroke in his mother.    ROS:  Please see the history of present illness.   Otherwise, review of systems are positive for none.   All other systems are reviewed and negative.    PHYSICAL EXAM: BP 107/68   Pulse 68   Ht 6\' 2"  (1.88 m)  GEN:  No distress NECK:  No jugular venous distention at 90 degrees, waveform within normal limits, carotid upstroke brisk and symmetric, no bruits, no thyromegaly LYMPHATICS:  No cervical adenopathy LUNGS:  Clear to auscultation bilaterally BACK:  No CVA tenderness CHEST:  Unremarkable HEART:  S1 and S2 within normal limits, no S3, no S4, no clicks, no rubs, no murmurs ABD:  Positive bowel sounds normal in frequency in pitch, no bruits, no rebound, no guarding, unable to assess midline mass or bruit with the patient seated. EXT:  2 plus pulses throughout, trace edema, no cyanosis no clubbing SKIN:  No rashes no nodules NEURO:  Cranial nerves II through XII grossly intact, motor grossly intact throughout PSYCH:  Cognitively intact, oriented to person place and time    EKG:  EKG is not ordered today. The ekg ordered today demonstrates sinus rhythm rate 44, axis within normal limits, intervals normal limits, no acute ST-T wave changes. 04/29/17  Recent Labs: 04/29/2017: ALT 34; BUN 19; Creatinine, Ser 0.96; Hemoglobin 14.0; Magnesium 2.3; Platelets 212; Potassium 3.9; Sodium 143; TSH 1.018    Lipid Panel No results found for: CHOL, TRIG, HDL, CHOLHDL,  VLDL, LDLCALC, LDLDIRECT    Wt Readings from Last 3 Encounters:  04/29/17 160 lb (72.6 kg)  04/20/16 294 lb 9.6 oz (133.6 kg)  02/03/16 270 lb (122.5 kg)      Other studies Reviewed: Additional studies/ records that were reviewed today include: ED records.  Review of the above records demonstrates:  Please see elsewhere in the note.     ASSESSMENT AND PLAN:   BRADYCARDIA:   I reviewed the records and the patient has no symptoms. His heart rate is increased today. Given this I don't think further cardiovascular testing is needed.  On review of his ER records he did have a normal TSH and electrolytes. He'll let me know if he ever has any symptoms such as lightheadedness, presyncope or syncope.  EDEMA:  This is mild and probably related to his decreased mobility.  There are no symptoms of heart failure.   No further testing is planned.   Current medicines are reviewed at length with the patient today.  The patient does not have concerns regarding medicines.  The following changes have been made:  no change  Labs/ tests ordered today include: None No orders of the defined types were placed in this encounter.    Disposition:   FU with me as needed.     Signed, Rollene Rotunda, MD  05/11/2017 2:22 PM    [facility]

## 2017-05-11 ENCOUNTER — Ambulatory Visit (INDEPENDENT_AMBULATORY_CARE_PROVIDER_SITE_OTHER): Payer: Medicare Other | Admitting: Cardiology

## 2017-05-11 ENCOUNTER — Encounter: Payer: Self-pay | Admitting: Cardiology

## 2017-05-11 VITALS — BP 107/68 | HR 68 | Ht 74.0 in

## 2017-05-11 DIAGNOSIS — R001 Bradycardia, unspecified: Secondary | ICD-10-CM

## 2017-05-11 NOTE — Patient Instructions (Signed)
Medication Instructions:  The current medical regimen is effective;  continue present plan and medications.  Follow-Up: Follow up as needed with Dr Hochrein.  Thank you for choosing [facility]!!     

## 2017-05-23 ENCOUNTER — Ambulatory Visit: Payer: Medicare Other | Admitting: Physician Assistant

## 2017-05-23 DIAGNOSIS — Z1389 Encounter for screening for other disorder: Secondary | ICD-10-CM | POA: Diagnosis not present

## 2017-05-23 DIAGNOSIS — Z6826 Body mass index (BMI) 26.0-26.9, adult: Secondary | ICD-10-CM | POA: Diagnosis not present

## 2017-05-23 DIAGNOSIS — Z0001 Encounter for general adult medical examination with abnormal findings: Secondary | ICD-10-CM | POA: Diagnosis not present

## 2017-05-23 DIAGNOSIS — D869 Sarcoidosis, unspecified: Secondary | ICD-10-CM | POA: Diagnosis not present

## 2017-05-23 DIAGNOSIS — G8220 Paraplegia, unspecified: Secondary | ICD-10-CM | POA: Diagnosis not present

## 2017-05-23 DIAGNOSIS — G35 Multiple sclerosis: Secondary | ICD-10-CM | POA: Diagnosis not present

## 2017-06-15 ENCOUNTER — Encounter: Payer: Self-pay | Admitting: Internal Medicine

## 2017-07-18 DIAGNOSIS — A630 Anogenital (venereal) warts: Secondary | ICD-10-CM | POA: Diagnosis not present

## 2017-07-18 DIAGNOSIS — L570 Actinic keratosis: Secondary | ICD-10-CM | POA: Diagnosis not present

## 2017-07-18 DIAGNOSIS — D485 Neoplasm of uncertain behavior of skin: Secondary | ICD-10-CM | POA: Diagnosis not present

## 2017-07-18 DIAGNOSIS — D225 Melanocytic nevi of trunk: Secondary | ICD-10-CM | POA: Diagnosis not present

## 2017-07-27 DIAGNOSIS — R252 Cramp and spasm: Secondary | ICD-10-CM | POA: Diagnosis not present

## 2017-07-27 DIAGNOSIS — Z23 Encounter for immunization: Secondary | ICD-10-CM | POA: Diagnosis not present

## 2017-07-27 DIAGNOSIS — K5903 Drug induced constipation: Secondary | ICD-10-CM | POA: Diagnosis not present

## 2017-07-27 DIAGNOSIS — G35 Multiple sclerosis: Secondary | ICD-10-CM | POA: Diagnosis not present

## 2017-07-27 DIAGNOSIS — M545 Low back pain: Secondary | ICD-10-CM | POA: Diagnosis not present

## 2017-11-08 ENCOUNTER — Other Ambulatory Visit: Payer: Self-pay | Admitting: Neurology

## 2017-11-08 ENCOUNTER — Ambulatory Visit (HOSPITAL_COMMUNITY): Payer: Medicare Other

## 2017-11-08 DIAGNOSIS — R5382 Chronic fatigue, unspecified: Secondary | ICD-10-CM | POA: Diagnosis not present

## 2017-11-08 DIAGNOSIS — G35 Multiple sclerosis: Secondary | ICD-10-CM | POA: Diagnosis not present

## 2017-11-08 DIAGNOSIS — M62838 Other muscle spasm: Secondary | ICD-10-CM | POA: Diagnosis not present

## 2017-11-08 DIAGNOSIS — G4453 Primary thunderclap headache: Secondary | ICD-10-CM | POA: Diagnosis not present

## 2017-11-09 ENCOUNTER — Encounter (HOSPITAL_COMMUNITY): Payer: Self-pay

## 2017-11-09 ENCOUNTER — Ambulatory Visit (HOSPITAL_COMMUNITY)
Admission: RE | Admit: 2017-11-09 | Discharge: 2017-11-09 | Disposition: A | Discharge status: Home / Self Care | Payer: Medicare Other | Source: Ambulatory Visit | Attending: Neurology | Admitting: Neurology

## 2017-11-09 ENCOUNTER — Encounter (HOSPITAL_COMMUNITY)
Admission: RE | Admit: 2017-11-09 | Discharge: 2017-11-09 | Disposition: A | Discharge status: Home / Self Care | Payer: Medicare Other | Source: Ambulatory Visit | Attending: Neurology | Admitting: Neurology

## 2017-11-09 DIAGNOSIS — G35 Multiple sclerosis: Secondary | ICD-10-CM | POA: Diagnosis not present

## 2017-11-09 DIAGNOSIS — R51 Headache: Secondary | ICD-10-CM | POA: Diagnosis not present

## 2017-11-09 DIAGNOSIS — G4453 Primary thunderclap headache: Secondary | ICD-10-CM | POA: Insufficient documentation

## 2017-11-09 MED ORDER — SODIUM CHLORIDE 0.9 % IV SOLN
1000.0000 mg | Freq: Every day | INTRAVENOUS | Status: DC
  Administered 2017-11-09: 1000 mg via INTRAVENOUS
  Filled 2017-11-09: qty 8

## 2017-11-09 MED ORDER — SODIUM CHLORIDE 0.9 % IV SOLN
INTRAVENOUS | Status: DC
  Administered 2017-11-09: 12:00:00 via INTRAVENOUS

## 2017-11-09 NOTE — Progress Notes (Signed)
Tolerated procedure well.  IV left in place with dressing, as he will be returning tomorrow for infusion.

## 2017-11-10 ENCOUNTER — Encounter (HOSPITAL_COMMUNITY)
Admission: RE | Admit: 2017-11-10 | Discharge: 2017-11-10 | Disposition: A | Discharge status: Home / Self Care | Payer: Medicare Other | Source: Ambulatory Visit | Attending: Neurology | Admitting: Neurology

## 2017-11-10 DIAGNOSIS — G35 Multiple sclerosis: Secondary | ICD-10-CM | POA: Diagnosis not present

## 2017-11-10 MED ORDER — SODIUM CHLORIDE 0.9 % IV SOLN
Freq: Every day | INTRAVENOUS | Status: DC
  Administered 2017-11-10: 13:00:00 via INTRAVENOUS

## 2017-11-10 MED ORDER — SODIUM CHLORIDE 0.9 % IV SOLN
1000.0000 mg | Freq: Every day | INTRAVENOUS | Status: DC
  Administered 2017-11-10: 1000 mg via INTRAVENOUS
  Filled 2017-11-10: qty 8

## 2017-11-10 MED ORDER — SODIUM CHLORIDE 0.9% FLUSH: 10.0000 mL | INTRAVENOUS | Status: DC

## 2017-11-11 ENCOUNTER — Encounter (HOSPITAL_COMMUNITY)
Admission: RE | Admit: 2017-11-11 | Discharge: 2017-11-11 | Disposition: A | Discharge status: Home / Self Care | Payer: Medicare Other | Source: Ambulatory Visit | Attending: Neurology | Admitting: Neurology

## 2017-11-11 ENCOUNTER — Encounter (HOSPITAL_COMMUNITY): Payer: Self-pay

## 2017-11-11 DIAGNOSIS — G35 Multiple sclerosis: Secondary | ICD-10-CM | POA: Diagnosis not present

## 2017-11-11 MED ORDER — SODIUM CHLORIDE 0.9% FLUSH: 10.0000 mL | Freq: Once | INTRAVENOUS | Status: DC

## 2017-11-11 MED ORDER — METHYLPREDNISOLONE SODIUM SUCC 1000 MG IJ SOLR
1000.0000 mg | Freq: Once | INTRAMUSCULAR | Status: AC
  Administered 2017-11-11: 1000 mg via INTRAVENOUS
  Filled 2017-11-11: qty 8

## 2017-11-11 MED ORDER — SODIUM CHLORIDE 0.9 % IV SOLN
INTRAVENOUS | Status: DC
  Administered 2017-11-11: 14:00:00 via INTRAVENOUS

## 2017-12-16 DIAGNOSIS — S63602A Unspecified sprain of left thumb, initial encounter: Secondary | ICD-10-CM | POA: Diagnosis not present

## 2017-12-16 DIAGNOSIS — G35 Multiple sclerosis: Secondary | ICD-10-CM | POA: Diagnosis not present

## 2017-12-16 DIAGNOSIS — M779 Enthesopathy, unspecified: Secondary | ICD-10-CM | POA: Diagnosis not present

## 2017-12-16 DIAGNOSIS — M1991 Primary osteoarthritis, unspecified site: Secondary | ICD-10-CM | POA: Diagnosis not present

## 2017-12-16 DIAGNOSIS — Z681 Body mass index (BMI) 19 or less, adult: Secondary | ICD-10-CM | POA: Diagnosis not present

## 2018-01-04 IMAGING — US US ABDOMEN COMPLETE
2 series · 13 of 25 positions shown · non-contrast
Comparison: Abdominal and pelvic CT scan January 30, 2014

CLINICAL DATA: Chronically elevated liver function studies, 3-4
months of dysphagia; history of previous cholecystectomy, fatty
infiltrative changes of the liver.

EXAM:
ABDOMEN ULTRASOUND COMPLETE

[Series 1: us abdomen complete · 0.22mm/px · 12 of 77 slices shown (1 of 2)]
[im 1/77]
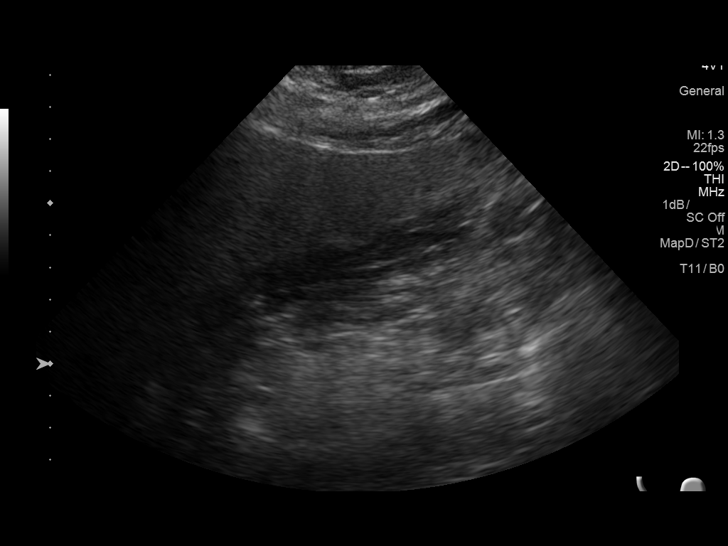
[im 7/77]
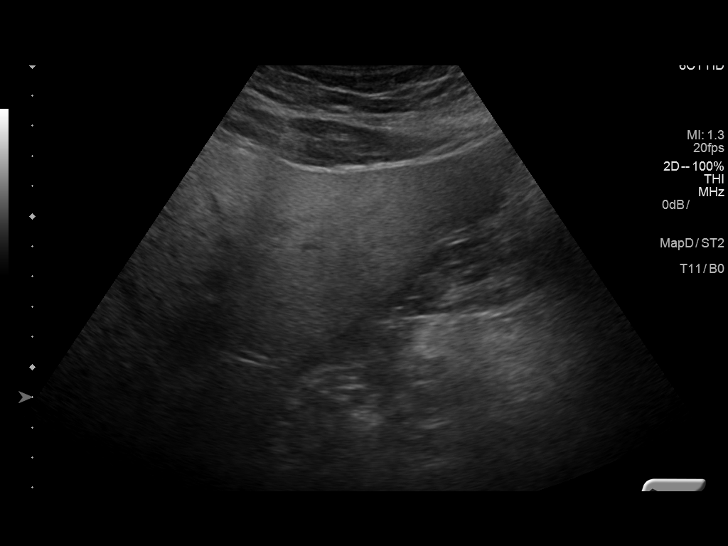
[im 14/77]
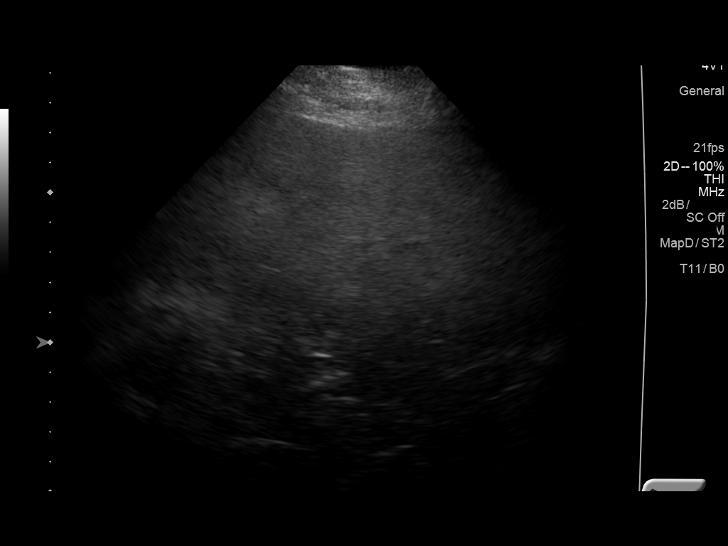
[im 20/77]
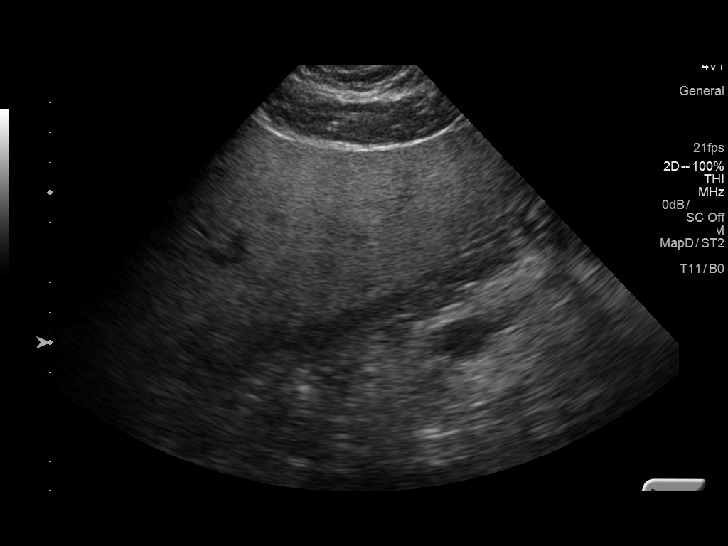
[im 27/77]
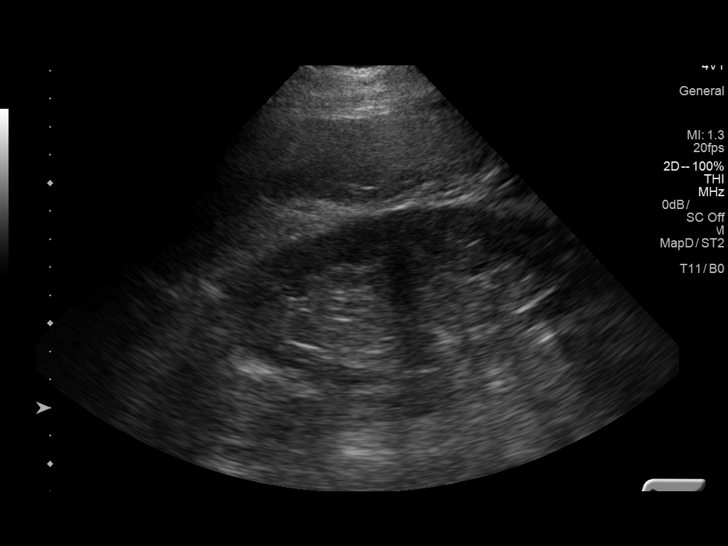
[im 34/77]
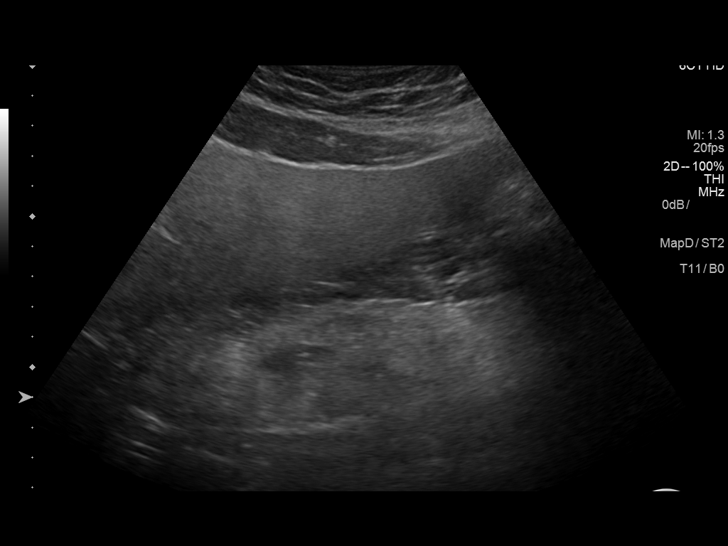
[im 40/77]
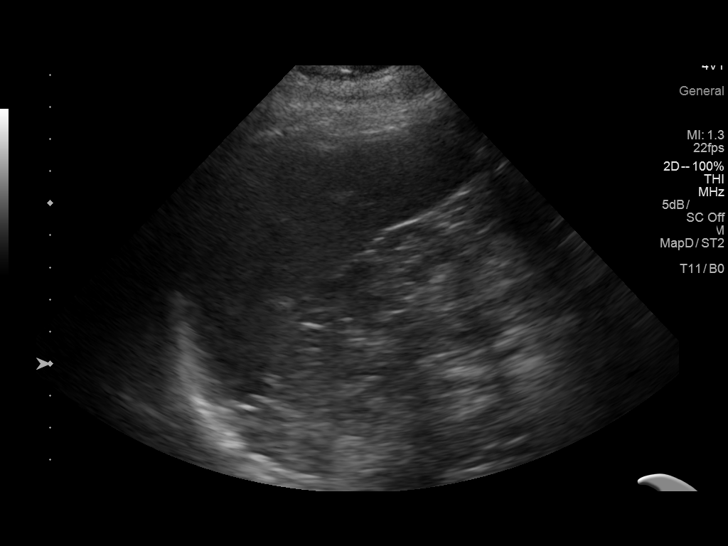
[im 47/77]
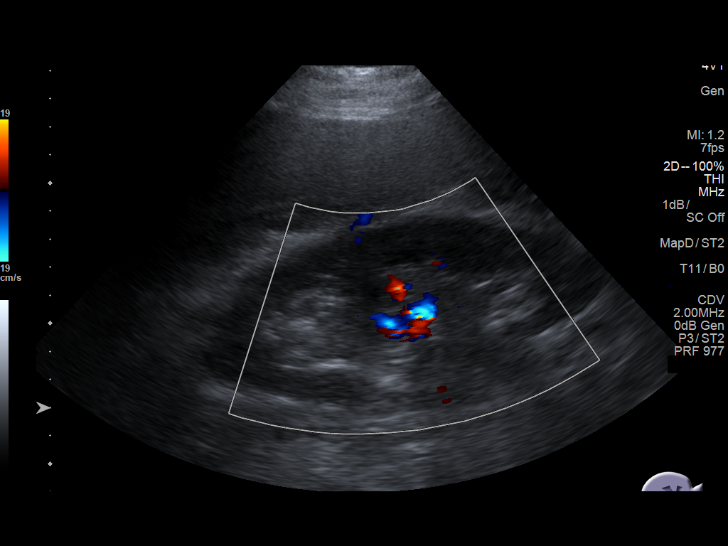
[im 53/77]
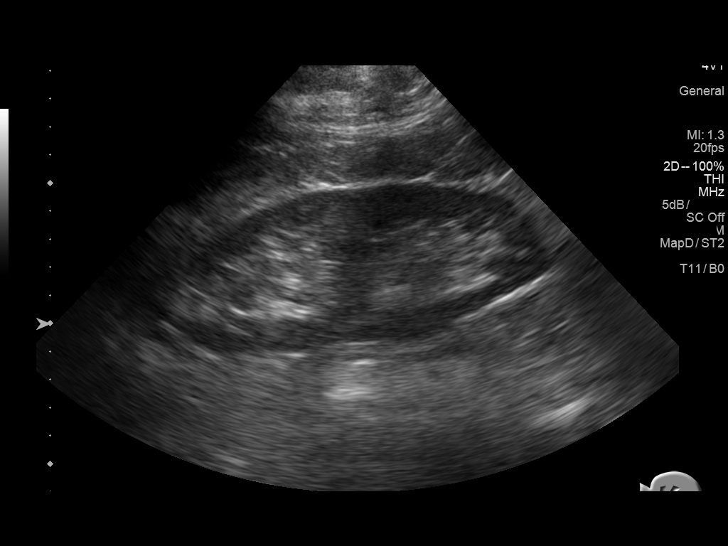
[im 60/77]
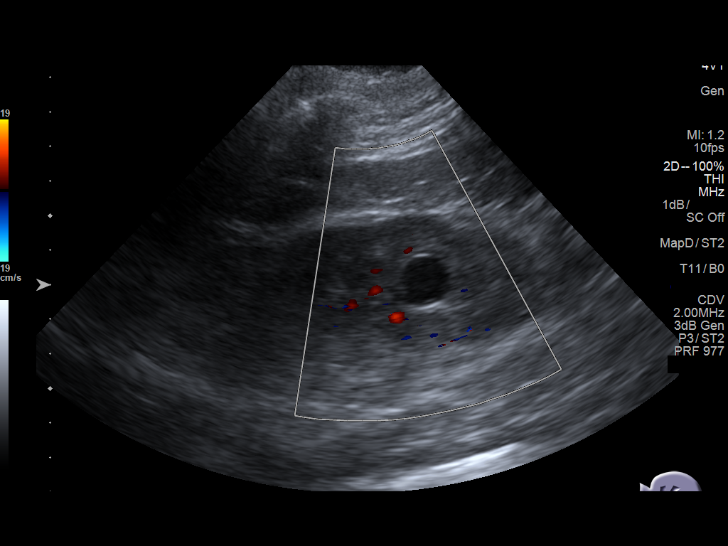
[im 67/77]
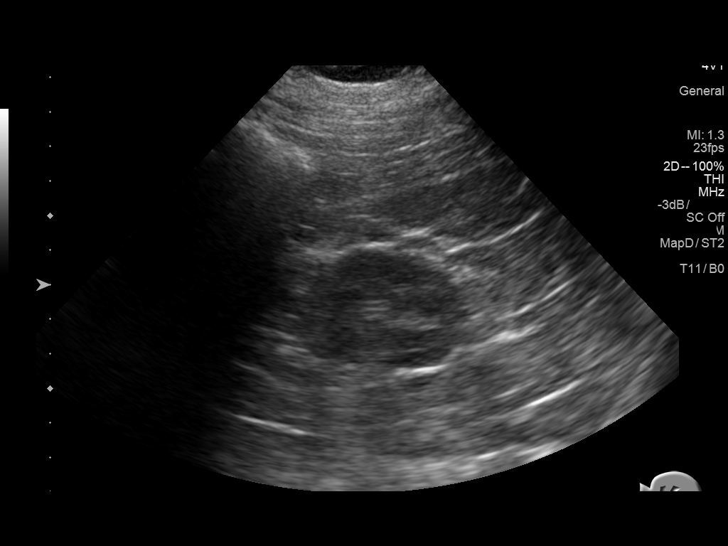
[im 73/77]
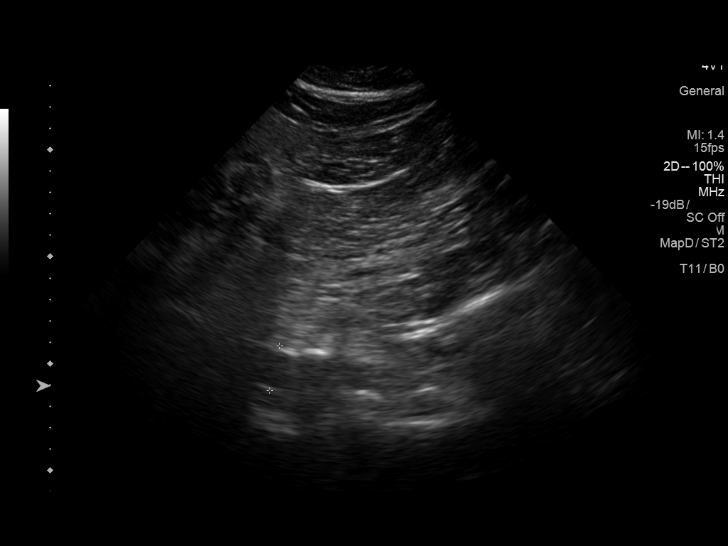

[Series 2001: us abdomen complete · 0.22mm/px · 1 of 1 slices shown (2 of 2)]
[im 1/1]
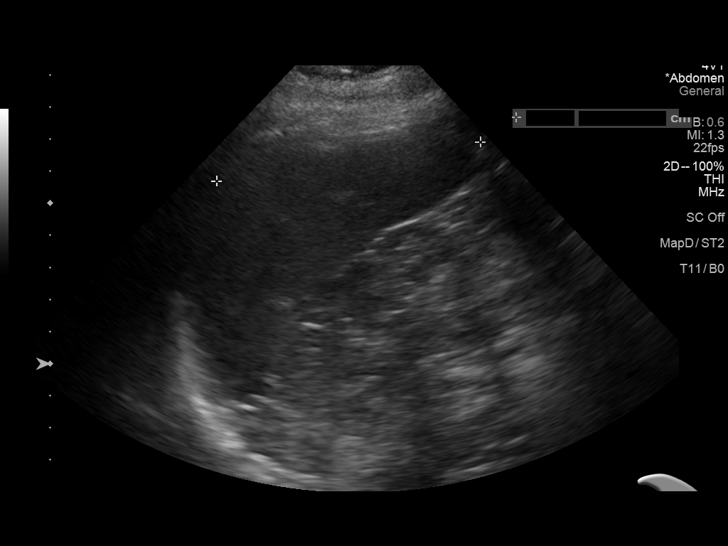

[13 of 25 positions shown; findings below may reference images not displayed]

FINDINGS: Gallbladder: The gallbladder is surgically absent.

Common bile duct: Diameter: 3.8 mm

Liver: The hepatic echotexture is increased diffusely. There is no
discrete mass nor evidence of ductal dilation. The surface contour
of the liver appears normal where visualized.

IVC: No abnormality visualized.

Pancreas: Visualized portion unremarkable.

Spleen: Size and appearance within normal limits.

Right Kidney: Length: 15 cm. The renal cortical echotexture on the
right remains lower than that of the adjacent liver. There is no
hydronephrosis.

Left Kidney: Length: 15 cm. There is a mid to lower pole cyst
measuring 1.7 cm in greatest dimension. There is no hydronephrosis.
The renal cortical echotexture is similar to that on the right.

Abdominal aorta: No aneurysm visualized. The distal aorta was
obscured by bowel gas.

Other findings: No ascites is observed.
IMPRESSION: 1. Increased hepatic echotexture consistent with fatty infiltrative
change. The gallbladder is surgically absent. The common bile duct,
pancreas, and spleen are unremarkable.
2. Simple appearing mid to lower pole renal cyst on the left.

## 2018-02-01 DIAGNOSIS — G35 Multiple sclerosis: Secondary | ICD-10-CM | POA: Diagnosis not present

## 2018-02-01 DIAGNOSIS — R5382 Chronic fatigue, unspecified: Secondary | ICD-10-CM | POA: Diagnosis not present

## 2018-02-01 DIAGNOSIS — R634 Abnormal weight loss: Secondary | ICD-10-CM | POA: Diagnosis not present

## 2018-02-01 DIAGNOSIS — Z79899 Other long term (current) drug therapy: Secondary | ICD-10-CM | POA: Diagnosis not present

## 2018-02-01 DIAGNOSIS — M62838 Other muscle spasm: Secondary | ICD-10-CM | POA: Diagnosis not present

## 2018-05-01 DIAGNOSIS — M62838 Other muscle spasm: Secondary | ICD-10-CM | POA: Diagnosis not present

## 2018-05-01 DIAGNOSIS — G35 Multiple sclerosis: Secondary | ICD-10-CM | POA: Diagnosis not present

## 2018-05-01 DIAGNOSIS — R5382 Chronic fatigue, unspecified: Secondary | ICD-10-CM | POA: Diagnosis not present

## 2018-05-01 DIAGNOSIS — R634 Abnormal weight loss: Secondary | ICD-10-CM | POA: Diagnosis not present

## 2018-05-30 DIAGNOSIS — G8220 Paraplegia, unspecified: Secondary | ICD-10-CM | POA: Diagnosis not present

## 2018-05-30 DIAGNOSIS — Z6825 Body mass index (BMI) 25.0-25.9, adult: Secondary | ICD-10-CM | POA: Diagnosis not present

## 2018-05-30 DIAGNOSIS — Z0001 Encounter for general adult medical examination with abnormal findings: Secondary | ICD-10-CM | POA: Diagnosis not present

## 2018-05-30 DIAGNOSIS — E039 Hypothyroidism, unspecified: Secondary | ICD-10-CM | POA: Diagnosis not present

## 2018-05-30 DIAGNOSIS — Z1389 Encounter for screening for other disorder: Secondary | ICD-10-CM | POA: Diagnosis not present

## 2018-05-30 DIAGNOSIS — G35 Multiple sclerosis: Secondary | ICD-10-CM | POA: Diagnosis not present

## 2018-05-30 DIAGNOSIS — J449 Chronic obstructive pulmonary disease, unspecified: Secondary | ICD-10-CM | POA: Diagnosis not present

## 2018-07-03 DIAGNOSIS — Z23 Encounter for immunization: Secondary | ICD-10-CM | POA: Diagnosis not present

## 2018-07-26 DIAGNOSIS — M62838 Other muscle spasm: Secondary | ICD-10-CM | POA: Diagnosis not present

## 2018-07-26 DIAGNOSIS — G35 Multiple sclerosis: Secondary | ICD-10-CM | POA: Diagnosis not present

## 2018-07-26 DIAGNOSIS — R634 Abnormal weight loss: Secondary | ICD-10-CM | POA: Diagnosis not present

## 2018-07-26 DIAGNOSIS — R5382 Chronic fatigue, unspecified: Secondary | ICD-10-CM | POA: Diagnosis not present

## 2018-09-18 DIAGNOSIS — L570 Actinic keratosis: Secondary | ICD-10-CM | POA: Diagnosis not present

## 2018-09-18 DIAGNOSIS — D1801 Hemangioma of skin and subcutaneous tissue: Secondary | ICD-10-CM | POA: Diagnosis not present

## 2018-09-18 DIAGNOSIS — L821 Other seborrheic keratosis: Secondary | ICD-10-CM | POA: Diagnosis not present

## 2018-11-22 DIAGNOSIS — R634 Abnormal weight loss: Secondary | ICD-10-CM | POA: Diagnosis not present

## 2018-11-22 DIAGNOSIS — G35 Multiple sclerosis: Secondary | ICD-10-CM | POA: Diagnosis not present

## 2018-11-22 DIAGNOSIS — Z79891 Long term (current) use of opiate analgesic: Secondary | ICD-10-CM | POA: Diagnosis not present

## 2018-12-06 DIAGNOSIS — Z681 Body mass index (BMI) 19 or less, adult: Secondary | ICD-10-CM | POA: Diagnosis not present

## 2018-12-06 DIAGNOSIS — R39198 Other difficulties with micturition: Secondary | ICD-10-CM | POA: Diagnosis not present

## 2018-12-06 DIAGNOSIS — Z1389 Encounter for screening for other disorder: Secondary | ICD-10-CM | POA: Diagnosis not present

## 2018-12-06 DIAGNOSIS — R339 Retention of urine, unspecified: Secondary | ICD-10-CM | POA: Diagnosis not present

## 2018-12-12 ENCOUNTER — Other Ambulatory Visit (HOSPITAL_COMMUNITY): Payer: Self-pay | Admitting: Family Medicine

## 2018-12-12 ENCOUNTER — Other Ambulatory Visit: Payer: Self-pay | Admitting: Family Medicine

## 2018-12-12 DIAGNOSIS — R39191 Need to immediately re-void: Secondary | ICD-10-CM

## 2018-12-12 DIAGNOSIS — Z1389 Encounter for screening for other disorder: Secondary | ICD-10-CM

## 2018-12-12 DIAGNOSIS — Z681 Body mass index (BMI) 19 or less, adult: Secondary | ICD-10-CM

## 2018-12-12 DIAGNOSIS — R339 Retention of urine, unspecified: Secondary | ICD-10-CM

## 2018-12-19 ENCOUNTER — Ambulatory Visit (HOSPITAL_COMMUNITY)
Admission: RE | Admit: 2018-12-19 | Discharge: 2018-12-19 | Disposition: A | Discharge status: Home / Self Care | Payer: Medicare Other | Source: Ambulatory Visit | Attending: Family Medicine | Admitting: Family Medicine

## 2018-12-19 ENCOUNTER — Other Ambulatory Visit: Payer: Self-pay

## 2018-12-19 DIAGNOSIS — Z681 Body mass index (BMI) 19 or less, adult: Secondary | ICD-10-CM | POA: Insufficient documentation

## 2018-12-19 DIAGNOSIS — R339 Retention of urine, unspecified: Secondary | ICD-10-CM | POA: Diagnosis not present

## 2018-12-19 DIAGNOSIS — Z1389 Encounter for screening for other disorder: Secondary | ICD-10-CM | POA: Insufficient documentation

## 2018-12-19 DIAGNOSIS — R39191 Need to immediately re-void: Secondary | ICD-10-CM | POA: Insufficient documentation

## 2018-12-19 DIAGNOSIS — R350 Frequency of micturition: Secondary | ICD-10-CM | POA: Diagnosis not present

## 2018-12-25 DIAGNOSIS — G35 Multiple sclerosis: Secondary | ICD-10-CM | POA: Diagnosis not present

## 2019-03-05 ENCOUNTER — Other Ambulatory Visit (HOSPITAL_COMMUNITY): Payer: Self-pay | Admitting: Family Medicine

## 2019-03-05 ENCOUNTER — Other Ambulatory Visit: Payer: Self-pay | Admitting: Family Medicine

## 2019-03-05 DIAGNOSIS — R339 Retention of urine, unspecified: Secondary | ICD-10-CM

## 2019-03-13 ENCOUNTER — Other Ambulatory Visit: Payer: Self-pay

## 2019-03-13 ENCOUNTER — Ambulatory Visit (HOSPITAL_COMMUNITY)
Admission: RE | Admit: 2019-03-13 | Discharge: 2019-03-13 | Disposition: A | Discharge status: Home / Self Care | Payer: Medicare Other | Source: Ambulatory Visit | Attending: Family Medicine | Admitting: Family Medicine

## 2019-03-13 DIAGNOSIS — Z1389 Encounter for screening for other disorder: Secondary | ICD-10-CM | POA: Insufficient documentation

## 2019-03-13 DIAGNOSIS — R339 Retention of urine, unspecified: Secondary | ICD-10-CM | POA: Diagnosis not present

## 2019-03-13 DIAGNOSIS — R39198 Other difficulties with micturition: Secondary | ICD-10-CM | POA: Diagnosis not present

## 2019-03-13 DIAGNOSIS — N281 Cyst of kidney, acquired: Secondary | ICD-10-CM | POA: Diagnosis not present

## 2019-03-26 DIAGNOSIS — D474 Osteomyelofibrosis: Secondary | ICD-10-CM | POA: Diagnosis not present

## 2019-03-26 DIAGNOSIS — R634 Abnormal weight loss: Secondary | ICD-10-CM | POA: Diagnosis not present

## 2019-03-26 DIAGNOSIS — G35 Multiple sclerosis: Secondary | ICD-10-CM | POA: Diagnosis not present

## 2019-03-26 DIAGNOSIS — Z79899 Other long term (current) drug therapy: Secondary | ICD-10-CM | POA: Diagnosis not present

## 2019-03-26 DIAGNOSIS — Z79891 Long term (current) use of opiate analgesic: Secondary | ICD-10-CM | POA: Diagnosis not present

## 2019-05-03 DIAGNOSIS — J449 Chronic obstructive pulmonary disease, unspecified: Secondary | ICD-10-CM | POA: Diagnosis not present

## 2019-05-03 DIAGNOSIS — F419 Anxiety disorder, unspecified: Secondary | ICD-10-CM | POA: Diagnosis not present

## 2019-05-03 DIAGNOSIS — G35 Multiple sclerosis: Secondary | ICD-10-CM | POA: Diagnosis not present

## 2019-05-03 DIAGNOSIS — Z681 Body mass index (BMI) 19 or less, adult: Secondary | ICD-10-CM | POA: Diagnosis not present

## 2019-05-03 DIAGNOSIS — G8220 Paraplegia, unspecified: Secondary | ICD-10-CM | POA: Diagnosis not present

## 2019-05-03 DIAGNOSIS — Z1389 Encounter for screening for other disorder: Secondary | ICD-10-CM | POA: Diagnosis not present

## 2019-06-25 DIAGNOSIS — G35 Multiple sclerosis: Secondary | ICD-10-CM | POA: Diagnosis not present

## 2019-06-25 DIAGNOSIS — R634 Abnormal weight loss: Secondary | ICD-10-CM | POA: Diagnosis not present

## 2019-06-25 DIAGNOSIS — Z79891 Long term (current) use of opiate analgesic: Secondary | ICD-10-CM | POA: Diagnosis not present

## 2019-07-02 DIAGNOSIS — Z23 Encounter for immunization: Secondary | ICD-10-CM | POA: Diagnosis not present

## 2019-07-02 DIAGNOSIS — Z0001 Encounter for general adult medical examination with abnormal findings: Secondary | ICD-10-CM | POA: Diagnosis not present

## 2019-07-02 DIAGNOSIS — F321 Major depressive disorder, single episode, moderate: Secondary | ICD-10-CM | POA: Diagnosis not present

## 2019-07-02 DIAGNOSIS — Z681 Body mass index (BMI) 19 or less, adult: Secondary | ICD-10-CM | POA: Diagnosis not present

## 2019-10-02 DIAGNOSIS — D1801 Hemangioma of skin and subcutaneous tissue: Secondary | ICD-10-CM | POA: Diagnosis not present

## 2019-10-02 DIAGNOSIS — L570 Actinic keratosis: Secondary | ICD-10-CM | POA: Diagnosis not present

## 2019-10-02 DIAGNOSIS — L819 Disorder of pigmentation, unspecified: Secondary | ICD-10-CM | POA: Diagnosis not present

## 2019-10-09 DIAGNOSIS — G35 Multiple sclerosis: Secondary | ICD-10-CM | POA: Diagnosis not present

## 2019-10-09 DIAGNOSIS — R634 Abnormal weight loss: Secondary | ICD-10-CM | POA: Diagnosis not present

## 2019-10-09 DIAGNOSIS — Z79891 Long term (current) use of opiate analgesic: Secondary | ICD-10-CM | POA: Diagnosis not present

## 2019-10-23 DIAGNOSIS — D474 Osteomyelofibrosis: Secondary | ICD-10-CM | POA: Diagnosis not present

## 2019-10-23 DIAGNOSIS — Z79899 Other long term (current) drug therapy: Secondary | ICD-10-CM | POA: Diagnosis not present

## 2019-11-15 ENCOUNTER — Encounter: Payer: Self-pay | Admitting: Internal Medicine

## 2020-01-02 DIAGNOSIS — G35 Multiple sclerosis: Secondary | ICD-10-CM | POA: Diagnosis not present

## 2020-01-02 DIAGNOSIS — R5382 Chronic fatigue, unspecified: Secondary | ICD-10-CM | POA: Diagnosis not present

## 2020-01-02 DIAGNOSIS — G894 Chronic pain syndrome: Secondary | ICD-10-CM | POA: Diagnosis not present

## 2020-01-02 DIAGNOSIS — M545 Low back pain: Secondary | ICD-10-CM | POA: Diagnosis not present

## 2020-01-03 ENCOUNTER — Other Ambulatory Visit: Payer: Self-pay | Admitting: Neurology

## 2020-01-03 ENCOUNTER — Other Ambulatory Visit (HOSPITAL_COMMUNITY): Payer: Self-pay | Admitting: Neurology

## 2020-01-03 DIAGNOSIS — R600 Localized edema: Secondary | ICD-10-CM

## 2020-01-09 ENCOUNTER — Other Ambulatory Visit: Payer: Self-pay

## 2020-01-09 ENCOUNTER — Ambulatory Visit (HOSPITAL_COMMUNITY)
Admission: RE | Admit: 2020-01-09 | Discharge: 2020-01-09 | Disposition: A | Discharge status: Home / Self Care | Payer: Medicare Other | Source: Ambulatory Visit | Attending: Neurology | Admitting: Neurology

## 2020-01-09 DIAGNOSIS — R600 Localized edema: Secondary | ICD-10-CM | POA: Insufficient documentation

## 2020-02-01 DIAGNOSIS — I10 Essential (primary) hypertension: Secondary | ICD-10-CM | POA: Diagnosis not present

## 2020-02-01 DIAGNOSIS — J449 Chronic obstructive pulmonary disease, unspecified: Secondary | ICD-10-CM | POA: Diagnosis not present

## 2020-02-01 DIAGNOSIS — Z87891 Personal history of nicotine dependence: Secondary | ICD-10-CM | POA: Diagnosis not present

## 2020-02-01 DIAGNOSIS — G35 Multiple sclerosis: Secondary | ICD-10-CM | POA: Diagnosis not present

## 2020-03-03 DIAGNOSIS — Z87891 Personal history of nicotine dependence: Secondary | ICD-10-CM | POA: Diagnosis not present

## 2020-03-03 DIAGNOSIS — J449 Chronic obstructive pulmonary disease, unspecified: Secondary | ICD-10-CM | POA: Diagnosis not present

## 2020-03-03 DIAGNOSIS — G35 Multiple sclerosis: Secondary | ICD-10-CM | POA: Diagnosis not present

## 2020-03-03 DIAGNOSIS — I10 Essential (primary) hypertension: Secondary | ICD-10-CM | POA: Diagnosis not present

## 2020-03-31 DIAGNOSIS — G35 Multiple sclerosis: Secondary | ICD-10-CM | POA: Diagnosis not present

## 2020-03-31 DIAGNOSIS — G894 Chronic pain syndrome: Secondary | ICD-10-CM | POA: Diagnosis not present

## 2020-03-31 DIAGNOSIS — M545 Low back pain: Secondary | ICD-10-CM | POA: Diagnosis not present

## 2020-03-31 DIAGNOSIS — R5382 Chronic fatigue, unspecified: Secondary | ICD-10-CM | POA: Diagnosis not present

## 2020-04-02 DIAGNOSIS — G35 Multiple sclerosis: Secondary | ICD-10-CM | POA: Diagnosis not present

## 2020-04-02 DIAGNOSIS — I7389 Other specified peripheral vascular diseases: Secondary | ICD-10-CM | POA: Diagnosis not present

## 2020-04-02 DIAGNOSIS — Z87891 Personal history of nicotine dependence: Secondary | ICD-10-CM | POA: Diagnosis not present

## 2020-04-02 DIAGNOSIS — J449 Chronic obstructive pulmonary disease, unspecified: Secondary | ICD-10-CM | POA: Diagnosis not present

## 2020-05-02 DIAGNOSIS — G35 Multiple sclerosis: Secondary | ICD-10-CM | POA: Diagnosis not present

## 2020-05-02 DIAGNOSIS — Z87891 Personal history of nicotine dependence: Secondary | ICD-10-CM | POA: Diagnosis not present

## 2020-05-02 DIAGNOSIS — I7389 Other specified peripheral vascular diseases: Secondary | ICD-10-CM | POA: Diagnosis not present

## 2020-05-02 DIAGNOSIS — J449 Chronic obstructive pulmonary disease, unspecified: Secondary | ICD-10-CM | POA: Diagnosis not present

## 2020-06-10 DIAGNOSIS — R601 Generalized edema: Secondary | ICD-10-CM | POA: Diagnosis not present

## 2020-06-10 DIAGNOSIS — M79672 Pain in left foot: Secondary | ICD-10-CM | POA: Diagnosis not present

## 2020-06-10 DIAGNOSIS — M79671 Pain in right foot: Secondary | ICD-10-CM | POA: Diagnosis not present

## 2020-06-12 DIAGNOSIS — G609 Hereditary and idiopathic neuropathy, unspecified: Secondary | ICD-10-CM | POA: Diagnosis not present

## 2020-07-18 DIAGNOSIS — Z0000 Encounter for general adult medical examination without abnormal findings: Secondary | ICD-10-CM | POA: Diagnosis not present

## 2020-07-18 DIAGNOSIS — R7309 Other abnormal glucose: Secondary | ICD-10-CM | POA: Diagnosis not present

## 2020-07-18 DIAGNOSIS — Z1389 Encounter for screening for other disorder: Secondary | ICD-10-CM | POA: Diagnosis not present

## 2020-07-18 DIAGNOSIS — E039 Hypothyroidism, unspecified: Secondary | ICD-10-CM | POA: Diagnosis not present

## 2020-07-18 DIAGNOSIS — R609 Edema, unspecified: Secondary | ICD-10-CM | POA: Diagnosis not present

## 2020-07-18 DIAGNOSIS — Z23 Encounter for immunization: Secondary | ICD-10-CM | POA: Diagnosis not present

## 2020-07-18 DIAGNOSIS — F329 Major depressive disorder, single episode, unspecified: Secondary | ICD-10-CM | POA: Diagnosis not present

## 2020-07-21 DIAGNOSIS — Z79891 Long term (current) use of opiate analgesic: Secondary | ICD-10-CM | POA: Diagnosis not present

## 2020-07-21 DIAGNOSIS — G35 Multiple sclerosis: Secondary | ICD-10-CM | POA: Diagnosis not present

## 2020-07-21 DIAGNOSIS — M5459 Other low back pain: Secondary | ICD-10-CM | POA: Diagnosis not present

## 2020-07-21 DIAGNOSIS — G894 Chronic pain syndrome: Secondary | ICD-10-CM | POA: Diagnosis not present

## 2020-07-21 DIAGNOSIS — R5382 Chronic fatigue, unspecified: Secondary | ICD-10-CM | POA: Diagnosis not present

## 2020-08-01 DIAGNOSIS — R531 Weakness: Secondary | ICD-10-CM | POA: Diagnosis not present

## 2020-08-01 DIAGNOSIS — G35 Multiple sclerosis: Secondary | ICD-10-CM | POA: Diagnosis not present

## 2020-08-01 DIAGNOSIS — Z7409 Other reduced mobility: Secondary | ICD-10-CM | POA: Diagnosis not present

## 2020-08-01 DIAGNOSIS — Q232 Congenital mitral stenosis: Secondary | ICD-10-CM | POA: Diagnosis not present

## 2020-09-17 DIAGNOSIS — D485 Neoplasm of uncertain behavior of skin: Secondary | ICD-10-CM | POA: Diagnosis not present

## 2020-09-17 DIAGNOSIS — E782 Mixed hyperlipidemia: Secondary | ICD-10-CM | POA: Diagnosis not present

## 2020-09-17 DIAGNOSIS — L820 Inflamed seborrheic keratosis: Secondary | ICD-10-CM | POA: Diagnosis not present

## 2020-09-17 DIAGNOSIS — L570 Actinic keratosis: Secondary | ICD-10-CM | POA: Diagnosis not present

## 2020-11-10 DIAGNOSIS — G35 Multiple sclerosis: Secondary | ICD-10-CM | POA: Diagnosis not present

## 2020-11-10 DIAGNOSIS — G894 Chronic pain syndrome: Secondary | ICD-10-CM | POA: Diagnosis not present

## 2020-11-10 DIAGNOSIS — M5459 Other low back pain: Secondary | ICD-10-CM | POA: Diagnosis not present

## 2020-11-10 DIAGNOSIS — R5382 Chronic fatigue, unspecified: Secondary | ICD-10-CM | POA: Diagnosis not present

## 2021-01-01 ENCOUNTER — Ambulatory Visit: Payer: Medicare Other | Admitting: Physical Therapy

## 2021-01-08 ENCOUNTER — Other Ambulatory Visit: Payer: Self-pay

## 2021-01-08 ENCOUNTER — Ambulatory Visit: Payer: Medicare Other | Admitting: Physical Therapy

## 2021-01-08 NOTE — Therapy (Addendum)
[facility] [address] [place], [place], [address] Phone: [phone]   Fax:  [phone]  Patient Details  Name: Chad Vasquez MRN: [id number] Date of Birth: Apr 03, 1961 Referring Provider:  Beryle Beams, MD  Encounter Date: 01/08/2021    Patient arrived to initial evaluation but we have discussed he may benefit going to physical therapy at [facility] for neuro rehab to address patient's goals. Patient and PT discussed track for ambulation at [facility] that he may benefit from as well.  Patient reported understanding agreement.     Guss Bunde, PT, DPT 01/08/2021, 1:32 PM  [facility] [address] [place], [place], [address] Phone: [phone]   Fax:  [phone]

## 2021-01-21 DIAGNOSIS — Q232 Congenital mitral stenosis: Secondary | ICD-10-CM | POA: Diagnosis not present

## 2021-01-21 DIAGNOSIS — R531 Weakness: Secondary | ICD-10-CM | POA: Diagnosis not present

## 2021-01-21 DIAGNOSIS — G35 Multiple sclerosis: Secondary | ICD-10-CM | POA: Diagnosis not present

## 2021-01-21 DIAGNOSIS — Z7409 Other reduced mobility: Secondary | ICD-10-CM | POA: Diagnosis not present

## 2021-01-23 ENCOUNTER — Ambulatory Visit (HOSPITAL_COMMUNITY): Payer: Medicare Other

## 2021-04-20 DIAGNOSIS — M5459 Other low back pain: Secondary | ICD-10-CM | POA: Diagnosis not present

## 2021-04-20 DIAGNOSIS — G35 Multiple sclerosis: Secondary | ICD-10-CM | POA: Diagnosis not present

## 2021-04-20 DIAGNOSIS — R5382 Chronic fatigue, unspecified: Secondary | ICD-10-CM | POA: Diagnosis not present

## 2021-04-20 DIAGNOSIS — G894 Chronic pain syndrome: Secondary | ICD-10-CM | POA: Diagnosis not present

## 2021-07-06 ENCOUNTER — Emergency Department (HOSPITAL_COMMUNITY): Payer: Medicare Other

## 2021-07-06 ENCOUNTER — Encounter (HOSPITAL_COMMUNITY): Payer: Self-pay | Admitting: Emergency Medicine

## 2021-07-06 ENCOUNTER — Other Ambulatory Visit: Payer: Self-pay

## 2021-07-06 ENCOUNTER — Inpatient Hospital Stay (HOSPITAL_COMMUNITY)
Admission: EM | Admit: 2021-07-06 | Discharge: 2021-07-10 | DRG: 178 | Disposition: A | Discharge status: Home Health | Payer: Medicare Other | Attending: Internal Medicine | Admitting: Internal Medicine

## 2021-07-06 ENCOUNTER — Observation Stay (HOSPITAL_COMMUNITY): Payer: Medicare Other

## 2021-07-06 DIAGNOSIS — G8220 Paraplegia, unspecified: Secondary | ICD-10-CM | POA: Diagnosis present

## 2021-07-06 DIAGNOSIS — Z8249 Family history of ischemic heart disease and other diseases of the circulatory system: Secondary | ICD-10-CM

## 2021-07-06 DIAGNOSIS — Z6832 Body mass index (BMI) 32.0-32.9, adult: Secondary | ICD-10-CM

## 2021-07-06 DIAGNOSIS — F419 Anxiety disorder, unspecified: Secondary | ICD-10-CM | POA: Diagnosis present

## 2021-07-06 DIAGNOSIS — E0781 Sick-euthyroid syndrome: Secondary | ICD-10-CM | POA: Diagnosis not present

## 2021-07-06 DIAGNOSIS — G9389 Other specified disorders of brain: Secondary | ICD-10-CM | POA: Diagnosis not present

## 2021-07-06 DIAGNOSIS — Z803 Family history of malignant neoplasm of breast: Secondary | ICD-10-CM | POA: Diagnosis not present

## 2021-07-06 DIAGNOSIS — J441 Chronic obstructive pulmonary disease with (acute) exacerbation: Secondary | ICD-10-CM | POA: Diagnosis present

## 2021-07-06 DIAGNOSIS — F32A Depression, unspecified: Secondary | ICD-10-CM | POA: Diagnosis present

## 2021-07-06 DIAGNOSIS — G894 Chronic pain syndrome: Secondary | ICD-10-CM | POA: Diagnosis not present

## 2021-07-06 DIAGNOSIS — Z823 Family history of stroke: Secondary | ICD-10-CM

## 2021-07-06 DIAGNOSIS — R519 Headache, unspecified: Secondary | ICD-10-CM | POA: Diagnosis not present

## 2021-07-06 DIAGNOSIS — I10 Essential (primary) hypertension: Secondary | ICD-10-CM | POA: Diagnosis present

## 2021-07-06 DIAGNOSIS — G35 Multiple sclerosis: Secondary | ICD-10-CM | POA: Diagnosis not present

## 2021-07-06 DIAGNOSIS — R2689 Other abnormalities of gait and mobility: Secondary | ICD-10-CM | POA: Diagnosis not present

## 2021-07-06 DIAGNOSIS — R531 Weakness: Secondary | ICD-10-CM

## 2021-07-06 DIAGNOSIS — G4733 Obstructive sleep apnea (adult) (pediatric): Secondary | ICD-10-CM | POA: Diagnosis not present

## 2021-07-06 DIAGNOSIS — E785 Hyperlipidemia, unspecified: Secondary | ICD-10-CM | POA: Diagnosis not present

## 2021-07-06 DIAGNOSIS — R0689 Other abnormalities of breathing: Secondary | ICD-10-CM | POA: Diagnosis not present

## 2021-07-06 DIAGNOSIS — Z87891 Personal history of nicotine dependence: Secondary | ICD-10-CM

## 2021-07-06 DIAGNOSIS — U071 COVID-19: Principal | ICD-10-CM | POA: Diagnosis present

## 2021-07-06 DIAGNOSIS — R7401 Elevation of levels of liver transaminase levels: Secondary | ICD-10-CM | POA: Diagnosis not present

## 2021-07-06 DIAGNOSIS — Z993 Dependence on wheelchair: Secondary | ICD-10-CM

## 2021-07-06 DIAGNOSIS — M79604 Pain in right leg: Secondary | ICD-10-CM | POA: Diagnosis not present

## 2021-07-06 DIAGNOSIS — G4489 Other headache syndrome: Secondary | ICD-10-CM | POA: Diagnosis not present

## 2021-07-06 DIAGNOSIS — M47812 Spondylosis without myelopathy or radiculopathy, cervical region: Secondary | ICD-10-CM | POA: Diagnosis not present

## 2021-07-06 DIAGNOSIS — M79605 Pain in left leg: Secondary | ICD-10-CM | POA: Diagnosis not present

## 2021-07-06 DIAGNOSIS — E669 Obesity, unspecified: Secondary | ICD-10-CM | POA: Diagnosis present

## 2021-07-06 LAB — RAPID URINE DRUG SCREEN, HOSP PERFORMED
Amphetamines: NOT DETECTED
Barbiturates: NOT DETECTED
Benzodiazepines: POSITIVE — AB
Cocaine: NOT DETECTED
Opiates: POSITIVE — AB
Tetrahydrocannabinol: NOT DETECTED

## 2021-07-06 LAB — URINALYSIS, ROUTINE W REFLEX MICROSCOPIC
Bilirubin Urine: NEGATIVE
Glucose, UA: NEGATIVE mg/dL
Hgb urine dipstick: NEGATIVE
Ketones, ur: NEGATIVE mg/dL
Leukocytes,Ua: NEGATIVE
Nitrite: NEGATIVE
Protein, ur: NEGATIVE mg/dL
Specific Gravity, Urine: 1.009 (ref 1.005–1.030)
pH: 7 (ref 5.0–8.0)

## 2021-07-06 LAB — DIFFERENTIAL
Abs Immature Granulocytes: 0.03 10*3/uL (ref 0.00–0.07)
Basophils Absolute: 0 10*3/uL (ref 0.0–0.1)
Basophils Relative: 0 %
Eosinophils Absolute: 0 10*3/uL (ref 0.0–0.5)
Eosinophils Relative: 1 %
Immature Granulocytes: 1 %
Lymphocytes Relative: 5 %
Lymphs Abs: 0.3 10*3/uL — ABNORMAL LOW (ref 0.7–4.0)
Monocytes Absolute: 0.8 10*3/uL (ref 0.1–1.0)
Monocytes Relative: 12 %
Neutro Abs: 5.2 10*3/uL (ref 1.7–7.7)
Neutrophils Relative %: 81 %

## 2021-07-06 LAB — COMPREHENSIVE METABOLIC PANEL
ALT: 60 U/L — ABNORMAL HIGH (ref 0–44)
AST: 51 U/L — ABNORMAL HIGH (ref 15–41)
Albumin: 4.3 g/dL (ref 3.5–5.0)
Alkaline Phosphatase: 75 U/L (ref 38–126)
Anion gap: 7 (ref 5–15)
BUN: 7 mg/dL — ABNORMAL LOW (ref 8–23)
CO2: 26 mmol/L (ref 22–32)
Calcium: 8.9 mg/dL (ref 8.9–10.3)
Chloride: 104 mmol/L (ref 98–111)
Creatinine, Ser: 0.84 mg/dL (ref 0.61–1.24)
GFR, Estimated: >60 mL/min (ref >60)
Glucose, Bld: 118 mg/dL — ABNORMAL HIGH (ref 70–99)
Potassium: 3.8 mmol/L (ref 3.5–5.1)
Sodium: 137 mmol/L (ref 135–145)
Total Bilirubin: 0.7 mg/dL (ref 0.3–1.2)
Total Protein: 7 g/dL (ref 6.5–8.1)

## 2021-07-06 LAB — TROPONIN I (HIGH SENSITIVITY)
Troponin I (High Sensitivity): 3 ng/L (ref <18)
Troponin I (High Sensitivity): 4 ng/L (ref <18)

## 2021-07-06 LAB — RESP PANEL BY RT-PCR (FLU A&B, COVID) ARPGX2
Influenza A by PCR: NEGATIVE
Influenza B by PCR: NEGATIVE
SARS Coronavirus 2 by RT PCR: POSITIVE — AB

## 2021-07-06 LAB — PROTIME-INR
INR: 1.1 (ref 0.8–1.2)
Prothrombin Time: 13.8 seconds (ref 11.4–15.2)

## 2021-07-06 LAB — CBC
HCT: 41.2 % (ref 39.0–52.0)
Hemoglobin: 14.1 g/dL (ref 13.0–17.0)
MCH: 31.3 pg (ref 26.0–34.0)
MCHC: 34.2 g/dL (ref 30.0–36.0)
MCV: 91.4 fL (ref 80.0–100.0)
Platelets: 168 10*3/uL (ref 150–400)
RBC: 4.51 MIL/uL (ref 4.22–5.81)
RDW: 12.8 % (ref 11.5–15.5)
WBC: 6.4 10*3/uL (ref 4.0–10.5)
nRBC: 0 % (ref 0.0–0.2)

## 2021-07-06 LAB — APTT: aPTT: 27 seconds (ref 24–36)

## 2021-07-06 LAB — ETHANOL: Alcohol, Ethyl (B): <10 mg/dL (ref <10)

## 2021-07-06 LAB — HIV ANTIBODY (ROUTINE TESTING W REFLEX): HIV Screen 4th Generation wRfx: NONREACTIVE

## 2021-07-06 LAB — D-DIMER, QUANTITATIVE: D-Dimer, Quant: 1.15 ug/mL-FEU — ABNORMAL HIGH (ref 0.00–0.50)

## 2021-07-06 LAB — CK: Total CK: 203 U/L (ref 49–397)

## 2021-07-06 MED ORDER — DIAZEPAM 5 MG PO TABS
5.0000 mg | ORAL_TABLET | Freq: Four times a day (QID) | ORAL | Status: DC | PRN
  Administered 2021-07-06 – 2021-07-10 (×8): 5 mg via ORAL
  Filled 2021-07-06 (×8): qty 1

## 2021-07-06 MED ORDER — HEPARIN SODIUM (PORCINE) 5000 UNIT/ML IJ SOLN
5000.0000 [IU] | Freq: Three times a day (TID) | INTRAMUSCULAR | Status: DC
  Filled 2021-07-06: qty 1

## 2021-07-06 MED ORDER — CALCIUM CARBONATE ANTACID 500 MG PO CHEW: 1.0000 | CHEWABLE_TABLET | Freq: Every day | ORAL | Status: DC | PRN

## 2021-07-06 MED ORDER — ACETAMINOPHEN 160 MG/5ML PO SOLN: 650.0000 mg | ORAL | Status: DC | PRN

## 2021-07-06 MED ORDER — ACETAMINOPHEN 650 MG RE SUPP: 650.0000 mg | RECTAL | Status: DC | PRN

## 2021-07-06 MED ORDER — SENNOSIDES-DOCUSATE SODIUM 8.6-50 MG PO TABS
1.0000 | ORAL_TABLET | Freq: Every evening | ORAL | Status: DC | PRN
  Administered 2021-07-07: 1 via ORAL
  Filled 2021-07-06: qty 1

## 2021-07-06 MED ORDER — VITAMIN D (ERGOCALCIFEROL) 1.25 MG (50000 UNIT) PO CAPS
50000.0000 [IU] | ORAL_CAPSULE | ORAL | Status: DC
  Administered 2021-07-07: 50000 [IU] via ORAL
  Filled 2021-07-06: qty 1

## 2021-07-06 MED ORDER — ALBUTEROL SULFATE HFA 108 (90 BASE) MCG/ACT IN AERS: 2.0000 | INHALATION_SPRAY | RESPIRATORY_TRACT | Status: DC | PRN

## 2021-07-06 MED ORDER — HEPARIN SODIUM (PORCINE) 5000 UNIT/ML IJ SOLN
5000.0000 [IU] | Freq: Three times a day (TID) | INTRAMUSCULAR | Status: DC
  Administered 2021-07-06 – 2021-07-10 (×12): 5000 [IU] via SUBCUTANEOUS
  Filled 2021-07-06 (×12): qty 1

## 2021-07-06 MED ORDER — STROKE: EARLY STAGES OF RECOVERY BOOK
Freq: Once | Status: DC
  Filled 2021-07-06: qty 1

## 2021-07-06 MED ORDER — GADOBUTROL 1 MMOL/ML IV SOLN
10.0000 mL | Freq: Once | INTRAVENOUS | Status: AC | PRN
  Administered 2021-07-06: 10 mL via INTRAVENOUS

## 2021-07-06 MED ORDER — MORPHINE SULFATE (PF) 4 MG/ML IV SOLN
4.0000 mg | Freq: Once | INTRAVENOUS | Status: AC
  Administered 2021-07-06: 4 mg via INTRAVENOUS
  Filled 2021-07-06: qty 1

## 2021-07-06 MED ORDER — VITAMIN B-12 1000 MCG PO TABS
1500.0000 ug | ORAL_TABLET | Freq: Every day | ORAL | Status: DC
  Administered 2021-07-06 – 2021-07-10 (×5): 1500 ug via ORAL
  Filled 2021-07-06 (×6): qty 2

## 2021-07-06 MED ORDER — DIPHENHYDRAMINE HCL 50 MG/ML IJ SOLN
12.5000 mg | Freq: Once | INTRAMUSCULAR | Status: AC
  Administered 2021-07-06: 12.5 mg via INTRAVENOUS
  Filled 2021-07-06: qty 1

## 2021-07-06 MED ORDER — METOCLOPRAMIDE HCL 5 MG/ML IJ SOLN
10.0000 mg | Freq: Once | INTRAMUSCULAR | Status: AC
  Administered 2021-07-06: 10 mg via INTRAVENOUS
  Filled 2021-07-06: qty 2

## 2021-07-06 MED ORDER — BACLOFEN 10 MG PO TABS
20.0000 mg | ORAL_TABLET | Freq: Three times a day (TID) | ORAL | Status: DC
  Administered 2021-07-06 – 2021-07-10 (×13): 20 mg via ORAL
  Filled 2021-07-06 (×14): qty 2

## 2021-07-06 MED ORDER — LOSARTAN POTASSIUM 50 MG PO TABS
50.0000 mg | ORAL_TABLET | Freq: Every morning | ORAL | Status: DC
  Administered 2021-07-06 – 2021-07-10 (×5): 50 mg via ORAL
  Filled 2021-07-06: qty 1
  Filled 2021-07-06: qty 2
  Filled 2021-07-06 (×2): qty 1

## 2021-07-06 MED ORDER — ACETAMINOPHEN 325 MG PO TABS
650.0000 mg | ORAL_TABLET | ORAL | Status: DC | PRN
  Administered 2021-07-06 – 2021-07-08 (×5): 650 mg via ORAL
  Filled 2021-07-06 (×5): qty 2

## 2021-07-06 NOTE — ED Notes (Signed)
Date and time results received: 07/06/21 0332   Test: COVID Critical Value: POSITIVE  Name of Provider Notified: Rancour, MD

## 2021-07-06 NOTE — Consult Note (Signed)
[facility] TeleNeurology Consult Services  Stat Consult  Date of Service:   07/06/2021 02:24:16  Diagnosis:       R53.1 - Weakness  Impression Chad Vasquez is a 60 yo M w/ a hx of MS and sarcoidosis who presents with weakness. Here, exam is notable for RUE weakness and b/l LE weakness (left worse than right). Head CT showed no acute intracranial pathology. Ddx includes MS or neurosarcoid relapse vs pseudorelapse in setting of infection or other systemic stressor. Recommend the following: - MRI brain w/wo contrast; MRI C spine w/wo contrast - PT/OT  CT HEAD: As Per Radiologist CT Head Showed No Acute Hemorrhage or Acute Core Infarct  Our recommendations are outlined below.  Consultations: Physical therapy/Occupational therapy  Disposition: Neurology will follow   Imaging Head CT: No acute intracranial pathology  Metrics: [facility] Notification Time: 07/06/2021 02:23:05 Stamp Time: 07/06/2021 02:24:16 Callback Response Time: 07/06/2021 02:25:49   ----------------------------------------------------------------------------------------------------  Chief Complaint: weakness  History of Present Illness: Patient is a 60 year old Male.  Chad Vasquez is a 60 yo M w/ a hx of MS and sarcoidosis who presents with weakness. Patient reports one week of worsening RUE and LLE weakness. Also reports dyspnea and chest pain. Has diagnosis of MS but is not currently on DMT. Here, exam is notable for RUE weakness and b/l LE weakness (left worse than right). Head CT showed no acute intracranial pathology.   Anticoagulant use:  No  Antiplatelet use: No   Examination: BP(150/75), Pulse(97), Blood Glucose(110)  Neuro Exam: General: Alert,Awake, Oriented to Time, Place, Person  Speech: Fluent:  Language: Intact:  Face: Symmetric:  Facial Sensation: Intact:  Visual Fields: Intact:  Extraocular Movements: Intact:  Motor Exam: Drift: RUE, RLE, LLE  Sensation:  diminished sensation in the lower extremities  Coordination: Intact:    Patient / Family was informed the Neurology Consult would occur via TeleHealth consult by way of interactive audio and video telecommunications and consented to receiving care in this manner.  Patient is being evaluated for possible acute neurologic impairment and high probability of imminent or life - threatening deterioration.I spent total of 21 minutes providing care to this patient, including time for face to face visit via telemedicine, review of medical records, imaging studies and discussion of findings with providers, the patient and / or family.   Dr Vicente Masson   [facility] [phone]  Case [id number]

## 2021-07-06 NOTE — ED Notes (Signed)
Upon Arrival, Pts CBG gathered without PT ID Band. Pts CBG is 103. POC Edit Sheet completed.

## 2021-07-06 NOTE — ED Triage Notes (Signed)
Pt c/o weakness for the past 2 or 3 days. Tonight c/o blurred vision and headache. EMS noticed slight right sided facial droop.

## 2021-07-06 NOTE — Progress Notes (Signed)
Chad Vasquez  is a 60 y.o. male, with history of anxiety and depression, COPD, coronary artery disease, GERD, hypertension, hyperlipidemia, multiple sclerosis, substance abuse, sarcoidosis, Schatzki's ring, and sleep apnea on CPAP presents the ED with a chief complaint of weakness.  Patient is a poor historian and is difficult to discern baseline.  He reports he has had a headache, weakness, and pain in his legs.  He has had episodes like this in the past.  This episode started yesterday.  Been progressively worse since it started.  He reports that he could not stand up, and that is why he called EMS.  Patient is wheelchair-bound, but usually able to stand on his left leg to transfer from wheelchair to recliner chair or to commode.  His left leg has been too weak to stand on.  Its not related to the pain, as the pain is chronic for him.  He reports that he has burning and stabbing pain in the bilateral legs.  Its been there for years.  Right now is a little better than it was before he left the house.  Patient reports that his headache is also like his normal headache.  Patient also reports gradually worsening right hand weakness.  He reports that this has been going on for a few months.  He has had no cough, no fevers.  He does report minimal dyspnea the morning of presentation.  He had no loss of taste or smell.  He reports that his home health aide called them yesterday to tell them that she had to take the day off today because she was running a fever and needed to be checked for COVID.   Patient does not smoke, does not drink alcohol, does not use illicit drugs.  He is vaccinated for COVID.  Patient is full code.  07/06/2021: Patient was seen and examined at his bedside.  Noted to have a mild right upper extremity weakness and bilateral lower extremity weakness.  He has a diagnosis of MS.  MRI brain and cervical spine with concern for MS progression.  Patient was admitted early this morning.  Agree with  Dr. Elita Boone assessment and plan.  Please refer to H&P for further details.

## 2021-07-06 NOTE — H&P (Signed)
TRH H&P    Patient Demographics:    Chad Vasquez, is a 60 y.o. male  MRN: [id number]  DOB - [birth year]  Admit Date - 07/06/2021  Referring MD/NP/PA: Rancour  Outpatient Primary MD for the patient is Assunta Found, MD  Patient coming from: Home  Chief complaint-.  Weakness   HPI:    Chad Vasquez  is a 60 y.o. male, with history of anxiety and depression, COPD, coronary artery disease, GERD, hypertension, hyperlipidemia, multiple sclerosis, substance abuse, sarcoidosis, Schatzki's ring, and sleep apnea on CPAP presents the ED with a chief complaint of weakness.  Patient is a poor historian and is difficult to discern baseline.  He reports he has had a headache, weakness, and pain in his legs.  He has had episodes like this in the past.  This episode started yesterday.  Been progressively worse since it started.  He reports that he could not stand up, and that is why he called EMS.  Patient is wheelchair-bound, but usually able to stand on his left leg to transfer from wheelchair to recliner chair or to commode.  His left leg has been too weak to stand on.  Its not related to the pain, as the pain is chronic for him.  He reports that he has burning and stabbing pain in the bilateral legs.  Its been there for years.  Right now is a little better than it was before he left the house.  Patient reports that his headache is also like his normal headache.  Patient also reports gradually worsening right hand weakness.  He reports that this has been going on for a few months.  He has had no cough, no fevers.  He does report minimal dyspnea the morning of presentation.  He had no loss of taste or smell.  He reports that his home health aide called them yesterday to tell them that she had to take the day off today because she was running a fever and needed to be checked for COVID.  Patient does not smoke, does not drink alcohol,  does not use illicit drugs.  He is vaccinated for COVID.  Patient is full code.  In the ED Temp 98.4, heart rate 88-94, respiratory rate 14-19, blood pressure 173/73, satting at 95% on room air No leukocytosis with a white blood cell count of 6.4, hemoglobin 14.1 Chemistry panel is unremarkable Troponin is 3 Liver function shows an AST of 51, ALT of 60 CPK is 201 D-dimer 1.15 in the setting of COVID-positive CT head without contrast shows generalized cerebral atrophy.  No acute intracranial abnormality Chest x-ray shows no acute abnormality EKG shows a heart rate of 82, accelerated junctional rhythm, QTC 412 Initially patient came in as code stroke which was canceled as his symptoms have been going on for some time.   Neurology was consulted in the ED and states the differential includes neurosarcoidosis or multiple sclerosis.  They recommended MRI brain with and C-spine without contrast.  They also recommended continuing with infectious and metabolic work-up.  Neurologist agrees that  COVID may be contributing to his symptoms. Admission was requested for further work-up of weakness    Review of systems:    In addition to the HPI above,  No Fever-chills, No Headache, No changes with Vision or hearing, No problems swallowing food or Liquids, No Chest pain, Cough reports minimal dyspnea, No Abdominal pain, No Nausea or Vomiting, bowel movements are regular, No Blood in stool or Urine, No dysuria, No new skin rashes or bruises, No new joints pains-aches,  No recent weight gain or loss, No polyuria, polydypsia or polyphagia, No significant Mental Stressors.  All other systems reviewed and are negative.    Past History of the following :    Past Medical History:  Diagnosis Date   Anxiety and depression    Arthritis    Chronic pain    COPD (chronic obstructive pulmonary disease) (HCC)    Coronary artery disease    GERD (gastroesophageal reflux disease)    Hiatal hernia     HTN (hypertension)    Hyperlipidemia    Multiple sclerosis (HCC) 2010   Polysubstance abuse (HCC) 2013   Sarcoidosis    2013, ???   Schatzki's ring    mild   Sleep apnea    CPAP      Past Surgical History:  Procedure Laterality Date   CARDIAC CATHETERIZATION  2005   CHOLECYSTECTOMY N/A 04/16/2013   Procedure: LAPAROSCOPIC CHOLECYSTECTOMY;  Surgeon: Dalia Heading, MD;  Location: [facility];  Service: General;  Laterality: N/A;   COLONOSCOPY  2011   Dr. Jena Gauss: normal   ESOPHAGOGASTRODUODENOSCOPY (EGD) WITH PROPOFOL N/A 02/16/2016   Dr.Rourk- LA grade A esophagitis, small hiatal hernia, normal second portion of the duodenum, mild schatzki ring, no specimens collected.   MALONEY DILATION N/A 02/16/2016   Procedure: Elease Hashimoto DILATION;  Surgeon: Corbin Ade, MD;  Location: [facility];  Service: Endoscopy;  Laterality: N/A;   MEDIASTINOSCOPY  05/15/2012   Procedure: MEDIASTINOSCOPY;  Surgeon: Loreli Slot, MD;  Location: [facility] OR;  Service: Thoracic;  Laterality: N/A;      Social History:      Social History   Tobacco Use   Smoking status: Former    Packs/day: 1.00    Years: 30.00    Pack years: 30.00    Types: Cigarettes    Quit date: 05/01/2012    Years since quitting: 9.1   Smokeless tobacco: Never   Tobacco comments:    Quit x 4 years  Substance Use Topics   Alcohol use: No    Alcohol/week: 0.0 standard drinks    Comment: quit 05/01/2012       Family History :     Family History  Problem Relation Age of Onset   Heart failure Mother    Stroke Mother    Heart attack Mother 83   Heart failure Father    Heart attack Father 8       CABG   Breast cancer Sister    Seizures Brother    Breast cancer Sister    Liver disease Neg Hx    Colon cancer Neg Hx       Home Medications:   Prior to Admission medications   Medication Sig Start Date End Date Taking? Authorizing Provider  baclofen (LIORESAL) 20 MG tablet Take 1 tablet (20 mg total) by mouth 3 (three)  times daily. 08/09/14   Penumalli, Glenford Bayley, MD  Biotin 67341 MCG TABS Take 50,000 mcg by mouth daily.    [provider]  calcium carbonate (TUMS - DOSED IN MG ELEMENTAL CALCIUM) 500 MG chewable tablet Chew 1 tablet by mouth daily as needed for indigestion or heartburn. Reported on 02/03/2016    [provider]  diazepam (VALIUM) 10 MG tablet Take 10 mg by mouth 4 (four) times daily.    [provider]  docusate sodium (COLACE) 100 MG capsule Take 200 mg by mouth 2 (two) times daily as needed for mild constipation. Reported on 02/03/2016    [provider]  losartan (COZAAR) 100 MG tablet Take 50 mg by mouth every morning.     [provider]  olmesartan (BENICAR) 40 MG tablet Take 40 mg by mouth daily.    [provider]  PROAIR HFA 108 (90 BASE) MCG/ACT inhaler INHALE TWO PUFFS INTO THE LUNGS EVERY SIX HOURS AS NEEDED FOR WHEEZING 04/08/14   Byrum, Les Pou, MD  Probiotic Product (PROBIOTIC DAILY PO) Take 1 capsule by mouth daily.    [provider]  traMADol (ULTRAM) 50 MG tablet Take 100 mg by mouth every 6 (six) hours as needed for moderate pain.     [provider]  vitamin B-12 (CYANOCOBALAMIN) 500 MCG tablet Take 1,500 mcg by mouth daily.    [provider]  Vitamin D, Ergocalciferol, (DRISDOL) 50000 UNITS CAPS capsule Take 50,000 Units by mouth every 7 (seven) days. Takes on Wedneday    [provider]     Allergies:    No Known Allergies   Physical Exam:   Vitals  Blood pressure (!) 149/79, pulse 95, temperature 99.2 F (37.3 C), temperature source Oral, resp. rate 16, height  (1.88 m), weight 113.4 kg, SpO2 94 %.  1.  General: Patient lying supine in bed,  no acute distress   2. Psychiatric: Alert and oriented x 3, mood and behavior normal for situation, pleasant and cooperative with exam   3. Neurologic: Speech and language are normal, face is symmetric, unable to move right leg which  is chronic, left leg with 2 out of 5 strength, right arm with 4 out of 5 strength   4. HEENMT:  Head is atraumatic, normocephalic, pupils reactive to light, neck is supple, trachea is midline, mucous membranes are moist   5. Respiratory : Lungs are clear to auscultation bilaterally without wheezing, rhonchi, rales, no cyanosis, no increase in work of breathing or accessory muscle use   6. Cardiovascular : Heart rate normal, rhythm is regular, no murmurs, rubs or gallops, peripheral edema present, peripheral pulses palpated   7. Gastrointestinal:  Abdomen is soft, nondistended, nontender to palpation bowel sounds active, no masses or organomegaly palpated   8. Skin:  Skin is warm, dry and intact without rashes, acute lesions, or ulcers on limited exam   9.Musculoskeletal:  No acute deformities or trauma, no asymmetry in tone, peripheral edema present, peripheral pulses palpated, no tenderness to palpation in the extremities     Data Review:    CBC Recent Labs  Lab 07/06/21 0155  WBC 6.4  HGB 14.1  HCT 41.2  PLT 168  MCV 91.4  MCH 31.3  MCHC 34.2  RDW 12.8  LYMPHSABS 0.3*  MONOABS 0.8  EOSABS 0.0  BASOSABS 0.0   ------------------------------------------------------------------------------------------------------------------  Results for orders placed or performed during the hospital encounter of 07/06/21 (from the past 48 hour(s))  Ethanol     Status: None   Collection Time: 07/06/21  1:55 AM  Result Value Ref Range   Alcohol, Ethyl (B) <10 <10 mg/dL  Comment: (NOTE) Lowest detectable limit for serum alcohol is 10 mg/dL.  For medical purposes only. Performed at [facility], [address] [place], [place] [address]   Protime-INR     Status: None   Collection Time: 07/06/21  1:55 AM  Result Value Ref Range   Prothrombin Time 13.8 11.4 - 15.2 seconds   INR 1.1 0.8 - 1.2    Comment: (NOTE) INR goal varies based on device and disease states. Performed at  [facility], [address], [place], [place] [address]   APTT     Status: None   Collection Time: 07/06/21  1:55 AM  Result Value Ref Range   aPTT 27 24 - 36 seconds    Comment: Performed at [facility], [address] [place], [place] [address]  CBC     Status: None   Collection Time: 07/06/21  1:55 AM  Result Value Ref Range   WBC 6.4 4.0 - 10.5 K/uL   RBC 4.51 4.22 - 5.81 MIL/uL   Hemoglobin 14.1 13.0 - 17.0 g/dL   HCT 36.4 68.0 - 32.1 %   MCV 91.4 80.0 - 100.0 fL   MCH 31.3 26.0 - 34.0 pg   MCHC 34.2 30.0 - 36.0 g/dL   RDW 22.4 82.5 - 00.3 %   Platelets 168 150 - 400 K/uL   nRBC 0.0 0.0 - 0.2 %    Comment: Performed at [facility], [address] [place], [place] [address]  Differential     Status: Abnormal   Collection Time: 07/06/21  1:55 AM  Result Value Ref Range   Neutrophils Relative % 81 %   Neutro Abs 5.2 1.7 - 7.7 K/uL   Lymphocytes Relative 5 %   Lymphs Abs 0.3 (L) 0.7 - 4.0 K/uL   Monocytes Relative 12 %   Monocytes Absolute 0.8 0.1 - 1.0 K/uL   Eosinophils Relative 1 %   Eosinophils Absolute 0.0 0.0 - 0.5 K/uL   Basophils Relative 0 %   Basophils Absolute 0.0 0.0 - 0.1 K/uL   Immature Granulocytes 1 %   Abs Immature Granulocytes 0.03 0.00 - 0.07 K/uL    Comment: Performed at [facility], [address], [place], [place] [address]  Comprehensive metabolic panel     Status: Abnormal   Collection Time: 07/06/21  1:55 AM  Result Value Ref Range   Sodium 137 135 - 145 mmol/L   Potassium 3.8 3.5 - 5.1 mmol/L   Chloride 104 98 - 111 mmol/L   CO2 26 22 - 32 mmol/L   Glucose, Bld 118 (H) 70 - 99 mg/dL    Comment: Glucose reference range applies only to samples taken after fasting for at least 8 hours.   BUN 7 (L) 8 - 23 mg/dL   Creatinine, Ser 4.50 0.61 - 1.24 mg/dL   Calcium 8.9 8.9 - 38.8 mg/dL   Total Protein 7.0 6.5 - 8.1 g/dL   Albumin 4.3 3.5 - 5.0 g/dL   AST 51 (H) 15 - 41 U/L   ALT 60 (H) 0 - 44 U/L   Alkaline Phosphatase 75 38 - 126 U/L   Total  Bilirubin 0.7 0.3 - 1.2 mg/dL   GFR, Estimated >82 >80 mL/min    Comment: (NOTE) Calculated using the CKD-EPI Creatinine Equation (2021)    Anion gap 7 5 - 15    Comment: Performed at [facility], [address] [place], [place] [address]  CK     Status: None   Collection Time:  07/06/21  1:55 AM  Result Value Ref Range   Total CK 203 49 - 397 U/L    Comment: Performed at [facility], [address] [place], [place] [address]  Troponin I (High Sensitivity)     Status: None   Collection Time: 07/06/21  1:55 AM  Result Value Ref Range   Troponin I (High Sensitivity) 3 <18 ng/L    Comment: (NOTE) Elevated high sensitivity troponin I (hsTnI) values and significant  changes across serial measurements may suggest ACS but many other  chronic and acute conditions are known to elevate hsTnI results.  Refer to the "Links" section for chest pain algorithms and additional  guidance. Performed at [facility], [address], [place], [place] [address]   D-dimer, quantitative     Status: Abnormal   Collection Time: 07/06/21  1:55 AM  Result Value Ref Range   D-Dimer, Quant 1.15 (H) 0.00 - 0.50 ug/mL-FEU    Comment: (NOTE) At the manufacturer cut-off value of 0.5 g/mL FEU, this assay has a negative predictive value of 95-100%.This assay is intended for use in conjunction with a clinical pretest probability (PTP) assessment model to exclude pulmonary embolism (PE) and deep venous thrombosis (DVT) in outpatients suspected of PE or DVT. Results should be correlated with clinical presentation. Performed at [facility], [address], [place], [place] [address]   Resp Panel by RT-PCR (Flu A&B, Covid) Nasopharyngeal Swab     Status: Abnormal   Collection Time: 07/06/21  2:25 AM   Specimen: Nasopharyngeal Swab; Nasopharyngeal(NP) swabs in vial transport medium  Result Value Ref Range   SARS Coronavirus 2 by RT PCR POSITIVE (A) NEGATIVE    Comment: RESULT CALLED TO, READ BACK BY AND VERIFIED  WITH: SAPPELT,J @ 0333 ON 07/06/21 BY JUW (NOTE) SARS-CoV-2 target nucleic acids are DETECTED.  The SARS-CoV-2 RNA is generally detectable in upper respiratory specimens during the acute phase of infection. Positive results are indicative of the presence of the identified virus, but do not rule out bacterial infection or co-infection with other pathogens not detected by the test. Clinical correlation with patient history and other diagnostic information is necessary to determine patient infection status. The expected result is Negative.  Fact Sheet for Patients: BloggerCourse.com  Fact Sheet for Healthcare Providers: SeriousBroker.it  This test is not yet approved or cleared by the [place] FDA and  has been authorized for detection and/or diagnosis of SARS-CoV-2 by FDA under an Emergency Use Authorization (EUA).  This EUA will remain in effect (meaning this test can b e used) for the duration of  the COVID-19 declaration under Section 564(b)(1) of the Act, 21 U.S.C. section 360bbb-3(b)(1), unless the authorization is terminated or revoked sooner.     Influenza A by PCR NEGATIVE NEGATIVE   Influenza B by PCR NEGATIVE NEGATIVE    Comment: (NOTE) The Xpert Xpress SARS-CoV-2/FLU/RSV plus assay is intended as an aid in the diagnosis of influenza from Nasopharyngeal swab specimens and should not be used as a sole basis for treatment. Nasal washings and aspirates are unacceptable for Xpert Xpress SARS-CoV-2/FLU/RSV testing.  Fact Sheet for Patients: BloggerCourse.com  Fact Sheet for Healthcare Providers: SeriousBroker.it  This test is not yet approved or cleared by the [place] FDA and has been authorized for detection and/or diagnosis of SARS-CoV-2 by FDA under an Emergency Use Authorization (EUA). This EUA will remain in effect (meaning this test can be used) for the  duration of the COVID-19 declaration under Section 564(b)(1) of the  Act, 21 U.S.C. section 360bbb-3(b)(1), unless the authorization is terminated or revoked.  Performed at [facility], [address], [place], [place] [address]   Troponin I (High Sensitivity)     Status: None   Collection Time: 07/06/21  5:05 AM  Result Value Ref Range   Troponin I (High Sensitivity) 4 <18 ng/L    Comment: (NOTE) Elevated high sensitivity troponin I (hsTnI) values and significant  changes across serial measurements may suggest ACS but many other  chronic and acute conditions are known to elevate hsTnI results.  Refer to the "Links" section for chest pain algorithms and additional  guidance. Performed at [facility], [address] [place], [place] [address]     Chemistries  Recent Labs  Lab 07/06/21 0155  NA 137  K 3.8  CL 104  CO2 26  GLUCOSE 118*  BUN 7*  CREATININE 0.84  CALCIUM 8.9  AST 51*  ALT 60*  ALKPHOS 75  BILITOT 0.7   ------------------------------------------------------------------------------------------------------------------  ------------------------------------------------------------------------------------------------------------------ GFR: Estimated Creatinine Clearance: 122.1 mL/min (by C-G formula based on SCr of 0.84 mg/dL). Liver Function Tests: Recent Labs  Lab 07/06/21 0155  AST 51*  ALT 60*  ALKPHOS 75  BILITOT 0.7  PROT 7.0  ALBUMIN 4.3   No results for input(s): LIPASE, AMYLASE in the last 168 hours. No results for input(s): AMMONIA in the last 168 hours. Coagulation Profile: Recent Labs  Lab 07/06/21 0155  INR 1.1   Cardiac Enzymes: Recent Labs  Lab 07/06/21 0155  CKTOTAL 203   BNP (last 3 results) No results for input(s): PROBNP in the last 8760 hours. HbA1C: No results for input(s): HGBA1C in the last 72 hours. CBG: No results for input(s): GLUCAP in the last 168 hours. Lipid Profile: No results for input(s): CHOL, HDL,  LDLCALC, TRIG, CHOLHDL, LDLDIRECT in the last 72 hours. Thyroid Function Tests: No results for input(s): TSH, T4TOTAL, FREET4, T3FREE, THYROIDAB in the last 72 hours. Anemia Panel: No results for input(s): VITAMINB12, FOLATE, FERRITIN, TIBC, IRON, RETICCTPCT in the last 72 hours.  --------------------------------------------------------------------------------------------------------------- Urine analysis:    Component Value Date/Time   COLORURINE YELLOW 04/15/2013 1108   APPEARANCEUR CLEAR 04/15/2013 1108   LABSPEC 1.020 04/15/2013 1108   PHURINE 6.5 04/15/2013 1108   GLUCOSEU NEGATIVE 04/15/2013 1108   HGBUR NEGATIVE 04/15/2013 1108   BILIRUBINUR NEGATIVE 04/15/2013 1108   KETONESUR NEGATIVE 04/15/2013 1108   PROTEINUR NEGATIVE 04/15/2013 1108   UROBILINOGEN 0.2 04/15/2013 1108   NITRITE NEGATIVE 04/15/2013 1108   LEUKOCYTESUR NEGATIVE 04/15/2013 1108      Imaging Results:    CT Head Wo Contrast  Result Date: 07/06/2021 CLINICAL DATA:  Weakness with blurred vision and headache. EXAM: CT HEAD WITHOUT CONTRAST TECHNIQUE: Contiguous axial images were obtained from the base of the skull through the vertex without intravenous contrast. COMPARISON:  November 09, 2017 FINDINGS: Brain: There is mild cerebral atrophy with widening of the extra-axial spaces and ventricular dilatation. There are areas of decreased attenuation within the white matter tracts of the supratentorial brain, consistent with microvascular disease changes. Vascular: No hyperdense vessel or unexpected calcification. Skull: Normal. Negative for fracture or focal lesion. Sinuses/Orbits: No acute finding. Other: None. IMPRESSION: 1. Generalized cerebral atrophy. 2. No acute intracranial abnormality. Electronically Signed   By: Aram Candela M.D.   On: 07/06/2021 02:12   DG Chest Portable 1 View  Result Date: 07/06/2021 CLINICAL DATA:  Weakness for 2-3 days EXAM: PORTABLE CHEST 1 VIEW COMPARISON:  04/29/2017 FINDINGS:  Cardiac shadow is  within normal limits. Lungs are well aerated bilaterally. No focal infiltrate or sizable effusion is seen. Minimal scarring in the left base is noted. No bony abnormality is seen. IMPRESSION: No acute abnormality noted. Electronically Signed   By: Alcide Clever M.D.   On: 07/06/2021 02:46      Assessment & Plan:    Active Problems:   Generalized weakness   Weakness In the left leg and right arm No difficulty with speech, swallowing, vision or hearing Patient reports that it feels like an MS flare Neurology consulted from the ED and recommends MRI of brain and cervical spine with and without contrast, and to only start steroids if indicated after imaging has been reviewed COVID infection could also be contributing We will check a TSH vitamin B12 folate vitamin D as part of the metabolic work-up We will check a UA as part of infectious work-up, chest x-ray is already showed no acute abnormality Consult PT OT Continue to monitor COVID-19 No hypoxia, or tachypnea Start Paxlovid Inhalers as needed No steroid for COVID indicated Continue to monitor OSA CPAP at night MS Obtaining MRIs in the a.m. Continue baclofen Patient is on no other MS medications as he reports they do not work for him Hypertension Continue losartan Anxiety Continue reduced dose of home Valium  Dispo -will likely need more consistent home health   DVT Prophylaxis-   Heparin- SCDs   AM Labs Ordered, also please review Full Orders  Family Communication: No family at bedside Code Status: Full  Admission status: Observation    * I certify that at the point of admission it is my clinical judgment that the patient will require inpatient hospital care spanning beyond 2 midnights from the point of admission due to high intensity of service, high risk for further deterioration and high frequency of surveillance required.*  Time spent in minutes : 65   Mahki Spikes B Zierle-Ghosh DO

## 2021-07-06 NOTE — ED Notes (Signed)
Attempted to call report to floor, person who answered stated that the bed has not been approved at this time.

## 2021-07-06 NOTE — ED Notes (Signed)
Patient's promfit and brief changed at this time.

## 2021-07-06 NOTE — ED Notes (Addendum)
Patient transported to MRI 

## 2021-07-06 NOTE — Progress Notes (Signed)
Patient refusing CPAP for tonight. 

## 2021-07-06 NOTE — ED Provider Notes (Addendum)
[facility] EMERGENCY DEPARTMENT Provider Note   CSN: [id number] Arrival date & time: 07/06/21  0152     History Chief Complaint  Patient presents with   Weakness    Chad Vasquez is a 60 y.o. male.  Patient presents via EMS with increased generalized weakness over the past 2 or 3 days.  He has a history of multiple sclerosis and is wheelchair-bound at baseline.  He also has a history of COPD, chronic pain, hypertension, sarcoidosis and sleep apnea.  EMS at bedside states there initially called out for shortness of breath but patient was not in any respiratory distress and not hypoxic.  He complained of increased weakness as well as pain in his legs, headache and blurry vision. EMS reported questionable facial droop when he arrived tonight but that is not present currently.  Patient states he has been increasingly weak over the past 2 or 3 days and not able to transfer to his wheelchair as he normally does.  Has worsening weakness in his right arm and left leg as well as blurry vision and a diffuse headache.  No fever.  No chest pain or shortness of breath.  Patient states he is not on any treatment for MS as "therapies do not work for me".  He follows with Dr. Gerilyn Pilgrim.  States EMS was called tonight because he felt short of breath was having increased pain in his legs, increased weakness in his right arm and increased difficulty with transfers involving his left leg.  He reports his last time he was normal was perhaps Wednesday,September 28.  He is a poor historian and is not certain how many days he has been feeling poorly.  D/w Patient's wife Chad Vasquez by phone.  She is a poor historian as well.  She states EMS was called tonight because patient was complaining of pain all over especially in his legs as well as a headache.  Chad Vasquez reports patient has been declining over the past 1 month in terms of his weakness and difficulty getting around and transferring.  The history is provided by the  patient and the EMS personnel. The history is limited by the condition of the patient.  Weakness Associated symptoms: arthralgias, dizziness, headaches, myalgias and shortness of breath   Associated symptoms: no fever, no nausea and no vomiting       Past Medical History:  Diagnosis Date   Anxiety and depression    Arthritis    Chronic pain    COPD (chronic obstructive pulmonary disease) (HCC)    Coronary artery disease    GERD (gastroesophageal reflux disease)    Hiatal hernia    HTN (hypertension)    Hyperlipidemia    Multiple sclerosis (HCC) 2010   Polysubstance abuse (HCC) 2013   Sarcoidosis    2013, ???   Schatzki's ring    mild   Sleep apnea    CPAP    Patient Active Problem List   Diagnosis Date Noted   Schatzki's ring    Hiatal hernia    Reflux esophagitis    Dysphagia    Abnormal LFTs 02/03/2016   Esophageal dysphagia 02/03/2016   Hyperlipidemia 08/13/2013   Venous insufficiency 08/13/2013   CAD (coronary artery disease) 08/13/2013   MS (multiple sclerosis) (HCC) 08/03/2013   Spasticity 08/03/2013   Obstructive sleep apnea 11/29/2012   Sarcoidosis 05/19/2012   Paraplegia (HCC) 05/19/2012   Multiple sclerosis exacerbation (HCC) 05/07/2012   Fever 05/07/2012   HTN (hypertension) 05/07/2012   COPD (chronic obstructive  pulmonary disease) (HCC) 05/07/2012   Chronic pain 05/07/2012   Polysubstance abuse (HCC) 05/07/2012   Lower extremity pain 05/07/2012    Past Surgical History:  Procedure Laterality Date   CARDIAC CATHETERIZATION  2005   CHOLECYSTECTOMY N/A 04/16/2013   Procedure: LAPAROSCOPIC CHOLECYSTECTOMY;  Surgeon: Dalia Heading, MD;  Location: [facility];  Service: General;  Laterality: N/A;   COLONOSCOPY  2011   Dr. Jena Gauss: normal   ESOPHAGOGASTRODUODENOSCOPY (EGD) WITH PROPOFOL N/A 02/16/2016   Dr.Rourk- LA grade A esophagitis, small hiatal hernia, normal second portion of the duodenum, mild schatzki ring, no specimens collected.   MALONEY DILATION  N/A 02/16/2016   Procedure: Elease Hashimoto DILATION;  Surgeon: Corbin Ade, MD;  Location: [facility];  Service: Endoscopy;  Laterality: N/A;   MEDIASTINOSCOPY  05/15/2012   Procedure: MEDIASTINOSCOPY;  Surgeon: Loreli Slot, MD;  Location: [facility];  Service: Thoracic;  Laterality: N/A;       Family History  Problem Relation Age of Onset   Heart failure Mother    Stroke Mother    Heart attack Mother 65   Heart failure Father    Heart attack Father 34       CABG   Breast cancer Sister    Seizures Brother    Breast cancer Sister    Liver disease Neg Hx    Colon cancer Neg Hx     Social History   Tobacco Use   Smoking status: Former    Packs/day: 1.00    Years: 30.00    Pack years: 30.00    Types: Cigarettes    Quit date: 05/01/2012    Years since quitting: 9.1   Smokeless tobacco: Never   Tobacco comments:    Quit x 4 years  Substance Use Topics   Alcohol use: No    Alcohol/week: 0.0 standard drinks    Comment: quit 05/01/2012   Drug use: No    Types: Cocaine, Marijuana    Comment: quit 05/01/2012    Home Medications Prior to Admission medications   Medication Sig Start Date End Date Taking? Authorizing Provider  baclofen (LIORESAL) 20 MG tablet Take 1 tablet (20 mg total) by mouth 3 (three) times daily. 08/09/14   Penumalli, Glenford Bayley, MD  Biotin 17793 MCG TABS Take 50,000 mcg by mouth daily.    [provider]  calcium carbonate (TUMS - DOSED IN MG ELEMENTAL CALCIUM) 500 MG chewable tablet Chew 1 tablet by mouth daily as needed for indigestion or heartburn. Reported on 02/03/2016    [provider]  diazepam (VALIUM) 10 MG tablet Take 10 mg by mouth 4 (four) times daily.    [provider]  docusate sodium (COLACE) 100 MG capsule Take 200 mg by mouth 2 (two) times daily as needed for mild constipation. Reported on 02/03/2016    [provider]  losartan (COZAAR) 100 MG tablet Take 50 mg by mouth every morning.     [provider]  olmesartan (BENICAR) 40 MG tablet Take 40 mg by mouth daily.    [provider]  PROAIR HFA 108 (90 BASE) MCG/ACT inhaler INHALE TWO PUFFS INTO THE LUNGS EVERY SIX HOURS AS NEEDED FOR WHEEZING 04/08/14   Byrum, Les Pou, MD  Probiotic Product (PROBIOTIC DAILY PO) Take 1 capsule by mouth daily.    [provider]  traMADol (ULTRAM) 50 MG tablet Take 100 mg by mouth every 6 (six) hours as needed for moderate pain.     [provider]  vitamin B-12 (CYANOCOBALAMIN) 500 MCG tablet Take 1,500 mcg by mouth daily.    [provider]  Vitamin D, Ergocalciferol, (DRISDOL) 50000 UNITS CAPS capsule Take 50,000 Units by mouth every 7 (seven) days. Takes on Wedneday    [provider]    Allergies    Patient has no known allergies.  Review of Systems   Review of Systems  Constitutional:  Positive for fatigue. Negative for activity change, appetite change and fever.  Eyes:  Positive for visual disturbance.  Respiratory:  Positive for shortness of breath.   Gastrointestinal:  Negative for nausea and vomiting.  Musculoskeletal:  Positive for arthralgias and myalgias.  Skin:  Negative for rash.  Neurological:  Positive for dizziness, weakness, light-headedness and headaches.   all other systems are negative except as noted in the HPI and PMH.   Physical Exam Updated Vital Signs BP (!) 147/70 (BP Location: Left Arm)   Pulse 88   Resp 16   Ht 6\' 2"  (1.88 m)   Wt 108.9 kg   SpO2 97%   BMI 30.81 kg/m   Physical Exam Vitals and nursing note reviewed.  Constitutional:      General: He is not in acute distress.    Appearance: He is well-developed.     Comments: Chronically ill appearing.   HENT:     Head: Normocephalic and atraumatic.     Mouth/Throat:     Pharynx: No oropharyngeal exudate.  Eyes:     Conjunctiva/sclera: Conjunctivae normal.     Pupils: Pupils are equal, round, and reactive to light.  Neck:     Comments: No  meningismus. Cardiovascular:     Rate and Rhythm: Normal rate and regular rhythm.     Heart sounds: Normal heart sounds. No murmur heard. Pulmonary:     Effort: Pulmonary effort is normal. No respiratory distress.     Breath sounds: Normal breath sounds.  Abdominal:     Palpations: Abdomen is soft.     Tenderness: There is no abdominal tenderness. There is no guarding or rebound.  Musculoskeletal:        General: No tenderness. Normal range of motion.     Cervical back: Normal range of motion and neck supple.  Skin:    General: Skin is warm.  Neurological:     Mental Status: He is alert.     Cranial Nerves: No cranial nerve deficit.     Motor: No abnormal muscle tone.     Coordination: Coordination normal.     Comments: Oriented to person and place.  No appreciable facial droop.  Tongue is midline. Weakness holding right arm off the bed with weak grip strength.  Patient states this is baseline.  3/5 strength of right arm versus 4/5 strength of left arm.  Unable to lift either leg off the bed.  Able to wiggle toes bilaterally.  States he normally has more strength in his left leg than this.   Intact DP and PT pulses  Psychiatric:        Behavior: Behavior normal.    ED Results / Procedures / Treatments   Labs (all labs ordered are listed, but only abnormal results are displayed) Labs Reviewed  RESP PANEL BY RT-PCR (FLU A&B, COVID) ARPGX2 - Abnormal; Notable for the following components:      Result Value   SARS Coronavirus 2 by RT PCR POSITIVE (*)    All other components within normal limits  DIFFERENTIAL - Abnormal; Notable for the following  components:   Lymphs Abs 0.3 (*)    All other components within normal limits  COMPREHENSIVE METABOLIC PANEL - Abnormal; Notable for the following components:   Glucose, Bld 118 (*)    BUN 7 (*)    AST 51 (*)    ALT 60 (*)    All other components within normal limits  D-DIMER, QUANTITATIVE - Abnormal; Notable for the following  components:   D-Dimer, Quant 1.15 (*)    All other components within normal limits  ETHANOL  PROTIME-INR  APTT  CBC  CK  RAPID URINE DRUG SCREEN, HOSP PERFORMED  URINALYSIS, ROUTINE W REFLEX MICROSCOPIC  TROPONIN I (HIGH SENSITIVITY)    EKG EKG Interpretation  Date/Time:  Monday July 06 2021 02:16:03 EDT Ventricular Rate:  82 PR Interval:    QRS Duration: 103 QT Interval:  352 QTC Calculation: 412 R Axis:   16 Text Interpretation: Accelerated junctional rhythm Low voltage, extremity and precordial leads No significant change was found Confirmed by Glynn Octave [phone]) on 07/06/2021 2:24:37 AM  Radiology CT Head Wo Contrast  Result Date: 07/06/2021 CLINICAL DATA:  Weakness with blurred vision and headache. EXAM: CT HEAD WITHOUT CONTRAST TECHNIQUE: Contiguous axial images were obtained from the base of the skull through the vertex without intravenous contrast. COMPARISON:  November 09, 2017 FINDINGS: Brain: There is mild cerebral atrophy with widening of the extra-axial spaces and ventricular dilatation. There are areas of decreased attenuation within the white matter tracts of the supratentorial brain, consistent with microvascular disease changes. Vascular: No hyperdense vessel or unexpected calcification. Skull: Normal. Negative for fracture or focal lesion. Sinuses/Orbits: No acute finding. Other: None. IMPRESSION: 1. Generalized cerebral atrophy. 2. No acute intracranial abnormality. Electronically Signed   By: Aram Candela M.D.   On: 07/06/2021 02:12   DG Chest Portable 1 View  Result Date: 07/06/2021 CLINICAL DATA:  Weakness for 2-3 days EXAM: PORTABLE CHEST 1 VIEW COMPARISON:  04/29/2017 FINDINGS: Cardiac shadow is within normal limits. Lungs are well aerated bilaterally. No focal infiltrate or sizable effusion is seen. Minimal scarring in the left base is noted. No bony abnormality is seen. IMPRESSION: No acute abnormality noted. Electronically Signed   By: Alcide Clever M.D.   On: 07/06/2021 02:46    Procedures Procedures   Medications Ordered in ED Medications - No data to display  ED Course  I have reviewed the triage vital signs and the nursing notes.  Pertinent labs & imaging results that were available during my care of the patient were reviewed by me and considered in my medical decision making (see chart for details).    MDM Rules/Calculators/A&P                           Patient with history of MS here with increased weakness over several days, blurry vision, headache and shortness of breath.  Last seen normal was September 28.  Not candidate for code stroke due to delay in presentation  CT head is negative.  Labs are reassuring without significant electrolyte abnormality.  EKG is nonischemic.  Creatinine is normal, CK is normal.  Patient gently hydrated.  He reported shortness of breath to EMS but is not having any hypoxia or increased work of breathing currently.  Chest x-ray is clear. COVID test is positive.  COVID may very well explain his generalized weakness, body aches, headache, shortness of breath and fatigue.  However he does have new weakness in his right arm  and left leg MS exacerbation cannot be ruled out. Patient has been having symptoms for at least 4 to possibly 5 days.  Uncertain whether he was within the window for antivirals. Neurology consult will be obtained.  Patient seen by Dr. Lilian Kapur of neurology.  He states it is unclear whether patient has neurosarcoidosis or multiple sclerosis.  Recommends MRI brain and C-spine with and without contrast.  Also recommends infectious and metabolic work-up.  He agrees COVID may be contributing to symptoms.  Patient likely require PT/OT evaluation as well.  No hypoxia or increased work of breathing.  No indication for steroids from a COVID standpoint. Admission d/w Dr. Carren Rang.   JEROMIE GAINOR was evaluated in Emergency Department on 07/06/2021 for the symptoms  described in the history of present illness. He was evaluated in the context of the global COVID-19 pandemic, which necessitated consideration that the patient might be at risk for infection with the SARS-CoV-2 virus that causes COVID-19. Institutional protocols and algorithms that pertain to the evaluation of patients at risk for COVID-19 are in a state of rapid change based on information released by regulatory bodies including the CDC and federal and state organizations. These policies and algorithms were followed during the patient's care in the ED.  Final Clinical Impression(s) / ED Diagnoses Final diagnoses:  Multiple sclerosis exacerbation (HCC)  COVID-19    Rx / DC Orders ED Discharge Orders     None        Izaya Netherton, Jeannett Senior, MD 07/06/21 [id number]    Glynn Octave, MD 07/06/21 [phone]

## 2021-07-06 NOTE — ED Notes (Signed)
Chad Vasquez cell # [phone]

## 2021-07-07 DIAGNOSIS — G8220 Paraplegia, unspecified: Secondary | ICD-10-CM | POA: Diagnosis present

## 2021-07-07 DIAGNOSIS — Z993 Dependence on wheelchair: Secondary | ICD-10-CM | POA: Diagnosis not present

## 2021-07-07 DIAGNOSIS — E785 Hyperlipidemia, unspecified: Secondary | ICD-10-CM | POA: Diagnosis present

## 2021-07-07 DIAGNOSIS — Z87891 Personal history of nicotine dependence: Secondary | ICD-10-CM | POA: Diagnosis not present

## 2021-07-07 DIAGNOSIS — U071 COVID-19: Secondary | ICD-10-CM | POA: Diagnosis present

## 2021-07-07 DIAGNOSIS — G4733 Obstructive sleep apnea (adult) (pediatric): Secondary | ICD-10-CM | POA: Diagnosis present

## 2021-07-07 DIAGNOSIS — F419 Anxiety disorder, unspecified: Secondary | ICD-10-CM | POA: Diagnosis present

## 2021-07-07 DIAGNOSIS — I10 Essential (primary) hypertension: Secondary | ICD-10-CM | POA: Diagnosis present

## 2021-07-07 DIAGNOSIS — E0781 Sick-euthyroid syndrome: Secondary | ICD-10-CM | POA: Diagnosis present

## 2021-07-07 DIAGNOSIS — J441 Chronic obstructive pulmonary disease with (acute) exacerbation: Secondary | ICD-10-CM | POA: Diagnosis present

## 2021-07-07 DIAGNOSIS — F32A Depression, unspecified: Secondary | ICD-10-CM | POA: Diagnosis present

## 2021-07-07 DIAGNOSIS — E669 Obesity, unspecified: Secondary | ICD-10-CM | POA: Diagnosis present

## 2021-07-07 DIAGNOSIS — R7401 Elevation of levels of liver transaminase levels: Secondary | ICD-10-CM | POA: Diagnosis present

## 2021-07-07 DIAGNOSIS — Z823 Family history of stroke: Secondary | ICD-10-CM | POA: Diagnosis not present

## 2021-07-07 DIAGNOSIS — Z6832 Body mass index (BMI) 32.0-32.9, adult: Secondary | ICD-10-CM | POA: Diagnosis not present

## 2021-07-07 DIAGNOSIS — Z803 Family history of malignant neoplasm of breast: Secondary | ICD-10-CM | POA: Diagnosis not present

## 2021-07-07 DIAGNOSIS — Z8249 Family history of ischemic heart disease and other diseases of the circulatory system: Secondary | ICD-10-CM | POA: Diagnosis not present

## 2021-07-07 DIAGNOSIS — G894 Chronic pain syndrome: Secondary | ICD-10-CM | POA: Diagnosis present

## 2021-07-07 DIAGNOSIS — G35 Multiple sclerosis: Secondary | ICD-10-CM | POA: Diagnosis not present

## 2021-07-07 DIAGNOSIS — R531 Weakness: Secondary | ICD-10-CM | POA: Diagnosis not present

## 2021-07-07 LAB — CBC
HCT: 46.7 % (ref 39.0–52.0)
Hemoglobin: 15.6 g/dL (ref 13.0–17.0)
MCH: 31.5 pg (ref 26.0–34.0)
MCHC: 33.4 g/dL (ref 30.0–36.0)
MCV: 94.2 fL (ref 80.0–100.0)
Platelets: 180 10*3/uL (ref 150–400)
RBC: 4.96 MIL/uL (ref 4.22–5.81)
RDW: 13.2 % (ref 11.5–15.5)
WBC: 8.9 10*3/uL (ref 4.0–10.5)
nRBC: 0 % (ref 0.0–0.2)

## 2021-07-07 LAB — LIPID PANEL
Cholesterol: 167 mg/dL (ref 0–200)
HDL: 51 mg/dL (ref >40)
LDL Cholesterol: 103 mg/dL — ABNORMAL HIGH (ref 0–99)
Total CHOL/HDL Ratio: 3.3 RATIO
Triglycerides: 66 mg/dL (ref <150)
VLDL: 13 mg/dL (ref 0–40)

## 2021-07-07 LAB — COMPREHENSIVE METABOLIC PANEL
ALT: 162 U/L — ABNORMAL HIGH (ref 0–44)
AST: 107 U/L — ABNORMAL HIGH (ref 15–41)
Albumin: 4.7 g/dL (ref 3.5–5.0)
Alkaline Phosphatase: 90 U/L (ref 38–126)
Anion gap: 11 (ref 5–15)
BUN: 10 mg/dL (ref 8–23)
CO2: 25 mmol/L (ref 22–32)
Calcium: 9.3 mg/dL (ref 8.9–10.3)
Chloride: 103 mmol/L (ref 98–111)
Creatinine, Ser: 0.89 mg/dL (ref 0.61–1.24)
GFR, Estimated: >60 mL/min (ref >60)
Glucose, Bld: 93 mg/dL (ref 70–99)
Potassium: 3.7 mmol/L (ref 3.5–5.1)
Sodium: 139 mmol/L (ref 135–145)
Total Bilirubin: 0.6 mg/dL (ref 0.3–1.2)
Total Protein: 8.1 g/dL (ref 6.5–8.1)

## 2021-07-07 LAB — TSH: TSH: 0.144 u[IU]/mL — ABNORMAL LOW (ref 0.350–4.500)

## 2021-07-07 LAB — VITAMIN B12: Vitamin B-12: 1155 pg/mL — ABNORMAL HIGH (ref 180–914)

## 2021-07-07 LAB — HEMOGLOBIN A1C
Hgb A1c MFr Bld: 5.4 % (ref 4.8–5.6)
Mean Plasma Glucose: 108.28 mg/dL

## 2021-07-07 LAB — VITAMIN D 25 HYDROXY (VIT D DEFICIENCY, FRACTURES): Vit D, 25-Hydroxy: 80.6 ng/mL (ref 30–100)

## 2021-07-07 LAB — FOLATE: Folate: 18.4 ng/mL (ref >5.9)

## 2021-07-07 LAB — GLUCOSE, CAPILLARY: Glucose-Capillary: 108 mg/dL — ABNORMAL HIGH (ref 70–99)

## 2021-07-07 MED ORDER — SODIUM CHLORIDE 0.9 % IV SOLN: 1.0000 g | INTRAVENOUS | Status: DC

## 2021-07-07 MED ORDER — IPRATROPIUM-ALBUTEROL 20-100 MCG/ACT IN AERS: 1.0000 | INHALATION_SPRAY | Freq: Two times a day (BID) | RESPIRATORY_TRACT | Status: DC

## 2021-07-07 MED ORDER — GUAIFENESIN ER 600 MG PO TB12
1200.0000 mg | ORAL_TABLET | Freq: Two times a day (BID) | ORAL | Status: AC
  Administered 2021-07-07 – 2021-07-10 (×6): 1200 mg via ORAL
  Filled 2021-07-07 (×7): qty 2

## 2021-07-07 MED ORDER — AZITHROMYCIN 250 MG PO TABS
250.0000 mg | ORAL_TABLET | Freq: Every day | ORAL | Status: DC
Start: 2021-07-08 — End: 2021-07-10
  Administered 2021-07-08 – 2021-07-10 (×3): 250 mg via ORAL
  Filled 2021-07-07 (×3): qty 1

## 2021-07-07 MED ORDER — KETOROLAC TROMETHAMINE 15 MG/ML IJ SOLN
15.0000 mg | Freq: Once | INTRAMUSCULAR | Status: AC
  Administered 2021-07-07: 15 mg via INTRAVENOUS
  Filled 2021-07-07: qty 1

## 2021-07-07 MED ORDER — SACCHAROMYCES BOULARDII 250 MG PO CAPS
250.0000 mg | ORAL_CAPSULE | Freq: Two times a day (BID) | ORAL | Status: DC
  Administered 2021-07-07 – 2021-07-10 (×6): 250 mg via ORAL
  Filled 2021-07-07 (×7): qty 1

## 2021-07-07 MED ORDER — IPRATROPIUM-ALBUTEROL 20-100 MCG/ACT IN AERS: 1.0000 | INHALATION_SPRAY | Freq: Four times a day (QID) | RESPIRATORY_TRACT | Status: DC | PRN

## 2021-07-07 MED ORDER — AZITHROMYCIN 250 MG PO TABS
500.0000 mg | ORAL_TABLET | Freq: Every day | ORAL | Status: AC
  Administered 2021-07-07: 500 mg via ORAL
  Filled 2021-07-07: qty 2

## 2021-07-07 MED ORDER — ASCORBIC ACID 500 MG PO TABS
500.0000 mg | ORAL_TABLET | Freq: Every day | ORAL | Status: DC
  Administered 2021-07-07 – 2021-07-10 (×4): 500 mg via ORAL
  Filled 2021-07-07 (×4): qty 1

## 2021-07-07 MED ORDER — METHYLPREDNISOLONE SODIUM SUCC 40 MG IJ SOLR
40.0000 mg | Freq: Two times a day (BID) | INTRAMUSCULAR | Status: DC
  Administered 2021-07-07 – 2021-07-08 (×3): 40 mg via INTRAVENOUS
  Filled 2021-07-07 (×3): qty 1

## 2021-07-07 MED ORDER — IPRATROPIUM-ALBUTEROL 20-100 MCG/ACT IN AERS: 1.0000 | INHALATION_SPRAY | Freq: Four times a day (QID) | RESPIRATORY_TRACT | Status: DC

## 2021-07-07 MED ORDER — GUAIFENESIN-DM 100-10 MG/5ML PO SYRP: 5.0000 mL | ORAL_SOLUTION | ORAL | Status: DC | PRN

## 2021-07-07 MED ORDER — ZINC SULFATE 220 (50 ZN) MG PO CAPS
220.0000 mg | ORAL_CAPSULE | Freq: Every day | ORAL | Status: DC
  Administered 2021-07-07 – 2021-07-10 (×4): 220 mg via ORAL
  Filled 2021-07-07 (×4): qty 1

## 2021-07-07 NOTE — Progress Notes (Signed)
Patient was upset that he did not receive antivirals and stated he "would die without them". The medical team, including Dr. Margo Aye had spoken with him about his liver function. Patient stated that he had lost a close relative to covid-19 a year ago, and was worried about his health due to this. After further discussion, patient's close relative was disclosed to not have been vaccinated. Patient is vaccinated, and is not short of breath at this time. He appeared to feel better after talking about the situation. Complained of a headache, unrelieved by tylenol. Notified Dr. Margo Aye who ordered a dose of toradol. Patient reported relief. Did work briefly with physical therapy, but could not tolerate sitting up in the chair for very long.

## 2021-07-07 NOTE — Progress Notes (Signed)
Patient refusing CPAP for tonight. 

## 2021-07-07 NOTE — Plan of Care (Signed)
  Problem: Acute Rehab PT Goals(only PT should resolve) Goal: Pt Will Go Supine/Side To Sit Outcome: Progressing Flowsheets (Taken 07/07/2021 1320) Pt will go Supine/Side to Sit:  with modified independence  with supervision Goal: Patient Will Perform Sitting Balance Outcome: Progressing Flowsheets (Taken 07/07/2021 1320) Patient will perform sitting balance: with modified independence Goal: Pt Will Transfer Bed To Chair/Chair To Bed Outcome: Progressing Flowsheets (Taken 07/07/2021 1320) Pt will Transfer Bed to Chair/Chair to Bed: with min assist

## 2021-07-07 NOTE — Progress Notes (Signed)
PROGRESS NOTE  Chad Vasquez ZPH:[id number] DOB: [birth year] DOA: 07/06/2021 PCP: Assunta Found, MD  HPI/Recap of past 24 hours: Chad Vasquez  is a 60 y.o. male, with history of chronic anxiety/depression, former tobacco use (1 pack/day since the age of 6, quit August 2022) COPD, coronary artery disease, GERD, hypertension, hyperlipidemia, multiple sclerosis, substance abuse, sarcoidosis, Schatzki's ring, and sleep apnea on CPAP presents to [facility] with a chief complaint of weakness.  He reports that he could not stand up, and that is why he called EMS.  Patient is wheelchair-bound, but usually able to stand on his left leg to transfer from wheelchair to recliner chair or to commode.  His left leg has been too weak to stand on.  Endorses intermittent headaches.  His home health aide called on 07/05/21 to tell them that she had to take the day off today because she was running a fever and needed to be checked for COVID.  Work-up in the ED revealed possible MS flare seen on MRI brain and MRI cervical spine.  COVID-19 screening test positive on 07/06/2021.  07/07/2021: Seen and examined at his bedside.  He is worried about not being on antiviral.  Paxlovid held due to acute transaminitis.  Assessment/Plan: Active Problems:   Generalized weakness  Generalized weakness, suspect possibly related to MS flare, seen on MRI brain and cervical spine. Started on IV Solu-Medrol Neurology has been consulted PT OT recommended home health PT OT Fall precautions.  COVID-19 viral infection Mild cough, not hypoxic Placement reviewed chest x-ray done on admission no lobular infiltrates noted Will start Z-Pak for COPD exacerbation. Continue IV steroids due to possible MS flare Bronchodilators Antitussives Incentive spirometer/flutter valve Mobilize as tolerated  Elevated liver chemistries, suspect in the setting of COVID-19 viral infection AST ALT uptrending Avoid hepatotoxic agents Antivirals held to  avoid worsening of LFTs. Continue to closely monitor LFTs.  Low TSH, hypothyroidism versus subclinical hyperthyroidism TSH 0.144 Obtain free T4  COPD exacerbation in the setting of COVID-19 viral infection and former tobacco use. Management as stated above Quit smoking in August.  Chronic anxiety/depression Resume home Valium  Essential hypertension BP is currently at goal Resume home losartan  Obesity BMI 32 Recommend weight loss outpatient with regular physical activity and healthy dieting.  Generalized weakness PT OT assessed and recommended home health PT OT Fall precautions TOC consulted to assist with home health services for DC planning.   Code Status: Subcu Lovenox daily  Family Communication: None at bedside  Disposition Plan: Likely will discharge to home with home health services.   Consultants: Neurology  Procedures: None  Antimicrobials: Azithromycin  DVT prophylaxis: Subcu Lovenox daily  Status is: Inpatient    Dispo:  Patient From: Home  Planned Disposition: Home with Health Care Svc  Medically stable for discharge: No          Objective: Vitals:   07/06/21 1725 07/06/21 1940 07/07/21 0407 07/07/21 1424  BP: 131/80 (!) 156/80 (!) 163/87 130/86  Pulse: 81 97 85 (!) 106  Resp: 20 20 20 18   Temp: 98.5 F (36.9 C) 98.6 F (37 C) 98.2 F (36.8 C) 98.5 F (36.9 C)  TempSrc: Oral Oral  Oral  SpO2: 98% 99% 99% 96%  Weight:      Height:        Intake/Output Summary (Last 24 hours) at 07/07/2021 1622 Last data filed at 07/07/2021 1300 Gross per 24 hour  Intake 480 ml  Output 2800 ml  Net -2320 ml  Filed Weights   07/06/21 0205 07/06/21 0222  Weight: 108.9 kg 113.4 kg    Exam:  General: 60 y.o. year-old male well developed well nourished in no acute distress.  Alert and oriented x3. Cardiovascular: Regular rate and rhythm with no rubs or gallops.  No thyromegaly or JVD noted.   Respiratory: Mild rales at bases with no  wheezing noted.  Poor inspiratory effort. Abdomen: Soft nontender nondistended with normal bowel sounds x4 quadrants. Musculoskeletal: Trace lower extremity edema. 2/4 pulses in all 4 extremities. Skin: No ulcerative lesions noted or rashes, Psychiatry: Mood is appropriate for condition and setting   Data Reviewed: CBC: Recent Labs  Lab 07/06/21 0155 07/07/21 0720  WBC 6.4 8.9  NEUTROABS 5.2  --   HGB 14.1 15.6  HCT 41.2 46.7  MCV 91.4 94.2  PLT 168 180   Basic Metabolic Panel: Recent Labs  Lab 07/06/21 0155 07/07/21 0720  NA 137 139  K 3.8 3.7  CL 104 103  CO2 26 25  GLUCOSE 118* 93  BUN 7* 10  CREATININE 0.84 0.89  CALCIUM 8.9 9.3   GFR: Estimated Creatinine Clearance: 115.3 mL/min (by C-G formula based on SCr of 0.89 mg/dL). Liver Function Tests: Recent Labs  Lab 07/06/21 0155 07/07/21 0720  AST 51* 107*  ALT 60* 162*  ALKPHOS 75 90  BILITOT 0.7 0.6  PROT 7.0 8.1  ALBUMIN 4.3 4.7   No results for input(s): LIPASE, AMYLASE in the last 168 hours. No results for input(s): AMMONIA in the last 168 hours. Coagulation Profile: Recent Labs  Lab 07/06/21 0155  INR 1.1   Cardiac Enzymes: Recent Labs  Lab 07/06/21 0155  CKTOTAL 203   BNP (last 3 results) No results for input(s): PROBNP in the last 8760 hours. HbA1C: Recent Labs    07/07/21 0657  HGBA1C 5.4   CBG: Recent Labs  Lab 07/06/21 0154  GLUCAP 108*   Lipid Profile: Recent Labs    07/07/21 0658  CHOL 167  HDL 51  LDLCALC 103*  TRIG 66  CHOLHDL 3.3   Thyroid Function Tests: Recent Labs    07/07/21 0659  TSH 0.144*   Anemia Panel: Recent Labs    07/07/21 0720  VITAMINB12 1,155*  FOLATE 18.4   Urine analysis:    Component Value Date/Time   COLORURINE YELLOW 07/06/2021 1439   APPEARANCEUR CLEAR 07/06/2021 1439   LABSPEC 1.009 07/06/2021 1439   PHURINE 7.0 07/06/2021 1439   GLUCOSEU NEGATIVE 07/06/2021 1439   HGBUR NEGATIVE 07/06/2021 1439   BILIRUBINUR NEGATIVE  07/06/2021 1439   KETONESUR NEGATIVE 07/06/2021 1439   PROTEINUR NEGATIVE 07/06/2021 1439   UROBILINOGEN 0.2 04/15/2013 1108   NITRITE NEGATIVE 07/06/2021 1439   LEUKOCYTESUR NEGATIVE 07/06/2021 1439   Sepsis Labs: @LABRCNTIP (procalcitonin:4,lacticidven:4)  ) Recent Results (from the past 240 hour(s))  Resp Panel by RT-PCR (Flu A&B, Covid) Nasopharyngeal Swab     Status: Abnormal   Collection Time: 07/06/21  2:25 AM   Specimen: Nasopharyngeal Swab; Nasopharyngeal(NP) swabs in vial transport medium  Result Value Ref Range Status   SARS Coronavirus 2 by RT PCR POSITIVE (A) NEGATIVE Final    Comment: RESULT CALLED TO, READ BACK BY AND VERIFIED WITH: SAPPELT,J @ 0333 ON 07/06/21 BY JUW (NOTE) SARS-CoV-2 target nucleic acids are DETECTED.  The SARS-CoV-2 RNA is generally detectable in upper respiratory specimens during the acute phase of infection. Positive results are indicative of the presence of the identified virus, but do not rule out bacterial infection or co-infection with  other pathogens not detected by the test. Clinical correlation with patient history and other diagnostic information is necessary to determine patient infection status. The expected result is Negative.  Fact Sheet for Patients: BloggerCourse.com  Fact Sheet for Healthcare Providers: SeriousBroker.it  This test is not yet approved or cleared by the [place] FDA and  has been authorized for detection and/or diagnosis of SARS-CoV-2 by FDA under an Emergency Use Authorization (EUA).  This EUA will remain in effect (meaning this test can b e used) for the duration of  the COVID-19 declaration under Section 564(b)(1) of the Act, 21 U.S.C. section 360bbb-3(b)(1), unless the authorization is terminated or revoked sooner.     Influenza A by PCR NEGATIVE NEGATIVE Final   Influenza B by PCR NEGATIVE NEGATIVE Final    Comment: (NOTE) The Xpert Xpress  SARS-CoV-2/FLU/RSV plus assay is intended as an aid in the diagnosis of influenza from Nasopharyngeal swab specimens and should not be used as a sole basis for treatment. Nasal washings and aspirates are unacceptable for Xpert Xpress SARS-CoV-2/FLU/RSV testing.  Fact Sheet for Patients: BloggerCourse.com  Fact Sheet for Healthcare Providers: SeriousBroker.it  This test is not yet approved or cleared by the [place] FDA and has been authorized for detection and/or diagnosis of SARS-CoV-2 by FDA under an Emergency Use Authorization (EUA). This EUA will remain in effect (meaning this test can be used) for the duration of the COVID-19 declaration under Section 564(b)(1) of the Act, 21 U.S.C. section 360bbb-3(b)(1), unless the authorization is terminated or revoked.  Performed at [facility], [address], [place], [place] [address]       Studies: No results found.  Scheduled Meds:   stroke: mapping our early stages of recovery book   Does not apply Once   vitamin C  500 mg Oral Daily   baclofen  20 mg Oral TID   heparin  5,000 Units Subcutaneous Q8H   losartan  50 mg Oral q morning   methylPREDNISolone (SOLU-MEDROL) injection  40 mg Intravenous Q12H   vitamin B-12  1,500 mcg Oral Daily   Vitamin D (Ergocalciferol)  50,000 Units Oral Q7 days   zinc sulfate  220 mg Oral Daily    Continuous Infusions:   LOS: 0 days     Darlin Drop, MD [facility] Pager [phone]  If 7PM-7AM, please contact night-coverage [email or web address] Password [email or web address] 07/07/2021, 4:22 PM

## 2021-07-07 NOTE — Evaluation (Signed)
Physical Therapy Evaluation Patient Details Name: Chad Vasquez MRN: [id number] DOB: 09/25/1961 Today's Date: 07/07/2021  History of Present Illness  Chad Vasquez  is a 60 y.o. male, with history of anxiety and depression, COPD, coronary artery disease, GERD, hypertension, hyperlipidemia, multiple sclerosis, substance abuse, sarcoidosis, Schatzki's ring, and sleep apnea on CPAP presents the ED with a chief complaint of weakness.  Patient is a poor historian and is difficult to discern baseline.  He reports he has had a headache, weakness, and pain in his legs.  He has had episodes like this in the past.  This episode started yesterday.  Been progressively worse since it started.  He reports that he could not stand up, and that is why he called EMS.  Patient is wheelchair-bound, but usually able to stand on his left leg to transfer from wheelchair to recliner chair or to commode.  His left leg has been too weak to stand on.  Its not related to the pain, as the pain is chronic for him.  He reports that he has burning and stabbing pain in the bilateral legs.  Its been there for years.  Right now is a little better than it was before he left the house.  Patient reports that his headache is also like his normal headache.  Patient also reports gradually worsening right hand weakness.  He reports that this has been going on for a few months.  He has had no cough, no fevers.  He does report minimal dyspnea the morning of presentation.  He had no loss of taste or smell.  He reports that his home health aide called them yesterday to tell them that she had to take the day off today because she was running a fever and needed to be checked for COVID.   Clinical Impression  Patient demonstrates slow labored movement for sitting up at bedside with use of bed rail, able to partial stand pivot on LLE during transfer to chair, but once fatigued had to use sliding board to get back into bed with loss of balance when not  using bed rail.  Patient tolerated sitting up in chair for approximately 40 minutes before requesting to go back to bed.  Patient will benefit from continued physical therapy in hospital and recommended venue below to increase strength, balance, endurance for safe ADLs and gait.         Recommendations for follow up therapy are one component of a multi-disciplinary discharge planning process, led by the attending physician.  Recommendations may be updated based on patient status, additional functional criteria and insurance authorization.  Follow Up Recommendations Home health PT;Supervision for mobility/OOB;Supervision - Intermittent    Equipment Recommendations  None recommended by PT    Recommendations for Other Services       Precautions / Restrictions Precautions Precautions: Fall Restrictions Weight Bearing Restrictions: No      Mobility  Bed Mobility Overal bed mobility: Needs Assistance Bed Mobility: Supine to Sit;Sit to Supine     Supine to sit: Supervision Sit to supine: Mod assist   General bed mobility comments: had diffiuclty getting back in bed to loss of balance resulting in falling backwards into bed when not using bed rail    Transfers Overall transfer level: Needs assistance   Transfers: Lateral/Scoot Transfers          Lateral/Scoot Transfers: Min assist;Mod assist;With slide board General transfer comment: once fatigued required use of sliding board with slow labored movement, poor sitting balance once  in bed  Ambulation/Gait                Stairs            Wheelchair Mobility    Modified Rankin (Stroke Patients Only)       Balance Overall balance assessment: Needs assistance Sitting-balance support: Feet supported;No upper extremity supported;Bilateral upper extremity supported Sitting balance-Leahy Scale: Good Sitting balance - Comments: sitting at EOB                                     Pertinent  Vitals/Pain Pain Assessment: 0-10 Pain Score: 9  Pain Location: head Pain Descriptors / Indicators: Headache Pain Intervention(s): Limited activity within patient's tolerance;Monitored during session;Repositioned    Home Living Family/patient expects to be discharged to:: Private residence Living Arrangements: Spouse/significant other Available Help at Discharge: Available PRN/intermittently;Family Type of Home: House Home Access: Ramped entrance     Home Layout: One level Home Equipment: Walker - 2 wheels;Wheelchair - power;Wheelchair - manual;Grab bars - toilet;Grab bars - tub/shower;Tub bench Additional Comments: Significant other availble at most times other than when running errands outside the house.    Prior Function Level of Independence: Needs assistance   Gait / Transfers Assistance Needed: Household mobility with power chair. Pt is able to scoot from bed to chair and pull self to toilet and tub bench.  ADL's / Homemaking Assistance Needed: Pt reports independence with ADL's; assisted by significant other for IADL's; cleaning person comes Monday through Friday for 4 hours as well.        Hand Dominance   Dominant Hand: Left    Extremity/Trunk Assessment   Upper Extremity Assessment Upper Extremity Assessment: Defer to OT evaluation RUE Deficits / Details: 2+/5 shoulder A/ROM; 3/5 elbow flexion extension; 3+/5 grip. 2+/5 wrist extension; 3/5 wrist flexoin. RUE Coordination: decreased fine motor;decreased gross motor LUE Deficits / Details: 4+/5 grossly LUE Coordination: WNL    Lower Extremity Assessment Lower Extremity Assessment: Generalized weakness;RLE deficits/detail;LLE deficits/detail RLE Deficits / Details: grossly 0/5 RLE Sensation: decreased proprioception RLE Coordination: decreased gross motor;decreased fine motor LLE Deficits / Details: grossly -3/5, except ankle dorsiflexors 0/5 LLE Sensation: decreased proprioception;decreased light touch LLE  Coordination: decreased gross motor;decreased fine motor    Cervical / Trunk Assessment Cervical / Trunk Assessment: Normal  Communication   Communication: No difficulties  Cognition Arousal/Alertness: Awake/alert Behavior During Therapy: WFL for tasks assessed/performed Overall Cognitive Status: Within Functional Limits for tasks assessed                                        General Comments      Exercises     Assessment/Plan    PT Assessment Patient needs continued PT services  PT Problem List Decreased strength;Decreased activity tolerance;Decreased balance;Decreased mobility       PT Treatment Interventions DME instruction;Functional mobility training;Therapeutic activities;Therapeutic exercise;Wheelchair mobility training;Patient/family education;Balance training    PT Goals (Current goals can be found in the Care Plan section)  Acute Rehab PT Goals Patient Stated Goal: return home PT Goal Formulation: With patient Time For Goal Achievement: 07/14/21 Potential to Achieve Goals: Good    Frequency Min 3X/week   Barriers to discharge        Co-evaluation PT/OT/SLP Co-Evaluation/Treatment: Yes Reason for Co-Treatment: Complexity of the patient's impairments (multi-system involvement);To address functional/ADL  transfers PT goals addressed during session: Mobility/safety with mobility;Balance;Proper use of DME OT goals addressed during session: ADL's and self-care       AM-PAC PT "6 Clicks" Mobility  Outcome Measure Help needed turning from your back to your side while in a flat bed without using bedrails?: A Little Help needed moving from lying on your back to sitting on the side of a flat bed without using bedrails?: A Little Help needed moving to and from a bed to a chair (including a wheelchair)?: A Lot Help needed standing up from a chair using your arms (e.g., wheelchair or bedside chair)?: Total Help needed to walk in hospital room?:  Total Help needed climbing 3-5 steps with a railing? : Total 6 Click Score: 11    End of Session   Activity Tolerance: Patient tolerated treatment well;Patient limited by fatigue Patient left: in chair;with call bell/phone within reach Nurse Communication: Mobility status PT Visit Diagnosis: Unsteadiness on feet (R26.81);Other abnormalities of gait and mobility (R26.89);Muscle weakness (generalized) (M62.81)    Time: 1610-9604 PT Time Calculation (min) (ACUTE ONLY): 33 min   Charges:   PT Evaluation $PT Eval Moderate Complexity: 1 Mod PT Treatments $Therapeutic Activity: 23-37 mins        1:18 PM, 07/07/21 Ocie Bob, MPT Physical Therapist with [facility] [phone] office [phone] mobile phone

## 2021-07-07 NOTE — Consult Note (Signed)
[facility] Tatyana Biber A. Gerilyn Pilgrim, MD     [email or web address]          Chad MCMENAMIN is an 60 y.o. male.   ASSESSMENT/PLAN: Worsening neurological function/leg weakness in the setting of acute COVID infection. I think this is most likely etiology. However, I am concerned about his progressive lesions on MRI. I would recommend 3-5 day course of high-dose Solu-Medrol. Preferably however I would like the patient to be on some treatment for COVID before starting high-dose steroids.  Paxolvid was considered but not given because of increase liver enzymes. Consider the remdesivir as the patient is a higher risk of complications from COVID given his MS and age.  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 He felt generally fatigued and weaker in a general fashion. The patient subsequently developed frontal headaches. He does have a history of episodic headaches in the past and in fact has some in the office 2 months ago he did complain of some episodic headaches. The patient's symptoms progress to significant leg weakness. He typically is able to transfer from bed to a commode his bed by himself. He has baseline significant leg weakness from multiple sclerosis. When he was seen in the office, his weakness was unchanged with the 2/5 weakness proximally in the legs. Patient reports that he has had some fevers at least once before being admitted to the hospital and while being admitted. He also did have 1 episode of dyspnea when the EMS was called. The dyspnea resolved after he was given oxygen. He has not had chest pain or palpitations. Patient is not on disease modifying medication at this time because of intolerable side effects with multiple medications. He has responded well however to episodes of high-dose pulse  steroids. The review systems otherwise unrevealing at this time.       GENERAL:  He is resting without difficulties at this time. He is on room air.  HEENT:  Neck is supple no trauma noted.  ABDOMEN: soft  EXTREMITIES: No edema   BACK: Normal  SKIN: Normal by inspection.    MENTAL STATUS: Alert and oriented. Speech, language and cognition are generally intact. Judgment and insight normal.   CRANIAL NERVES: Pupils are equal, round and reactive to light and accomodation; extra ocular movements are full, there is no significant nystagmus; visual fields are full; upper and lower facial muscles are normal in strength and symmetric, there is no flattening of the nasolabial folds; tongue is midline; uvula is midline; shoulder elevation is normal.  MOTOR:  The upper extremities shows normal strength, bulk and tone except for the right hand. The right hand the has 3/5 weakness with hand grip and intrinsic hand muscles. Wrist extension is 5 however.  Left lower extremity strength proximally 2/5 and distally also 2/5. Right lower extremity 1/5 proximally and distally.  COORDINATION: Left finger to nose is normal, right finger to nose is normal, No rest tremor; no intention tremor; no postural tremor; no bradykinesia.  REFLEXES: Deep tendon reflexes are symmetrical and normal.   SENSATION: Normal to pain.      OFFICE EXAM 04-2021 MOTOR: Arms There appears to be lost of the bulk of the medial wrist flexors on the right side. Additionally the hand is a mild claw-like deformity with loss of bulk involving the  FDI -; Hip flexion on R 2/5; L hip flexion 2-3/5 and R 2/5. Right dorsiflexion 0/5 and left 2/5. Right and left knee extension 3/5. COORDINATION: Left finger to nose is normal, right finger to nose is normal, No rest tremor; no intention tremor; no postural tremor; no bradykinesia. GAIT: Wheelchair-bound      Blood pressure 130/86, pulse (!) 106, temperature 98.5 F (36.9 C),  temperature source Oral, resp. rate 18, height  (1.88 m), weight 113.4 kg, SpO2 96 %.  Past Medical History:  Diagnosis Date   Anxiety and depression    Arthritis    Chronic pain    COPD (chronic obstructive pulmonary disease) (HCC)    Coronary artery disease    GERD (gastroesophageal reflux disease)    Hiatal hernia    HTN (hypertension)    Hyperlipidemia    Multiple sclerosis (HCC) 2010   Polysubstance abuse (HCC) 2013   Sarcoidosis    2013, ???   Schatzki's ring    mild   Sleep apnea    CPAP    Past Surgical History:  Procedure Laterality Date   CARDIAC CATHETERIZATION  2005   CHOLECYSTECTOMY N/A 04/16/2013   Procedure: LAPAROSCOPIC CHOLECYSTECTOMY;  Surgeon: Dalia Heading, MD;  Location: [facility];  Service: General;  Laterality: N/A;   COLONOSCOPY  2011   Dr. Jena Gauss: normal   ESOPHAGOGASTRODUODENOSCOPY (EGD) WITH PROPOFOL N/A 02/16/2016   Dr.Rourk- LA grade A esophagitis, small hiatal hernia, normal second portion of the duodenum, mild schatzki ring, no specimens collected.   MALONEY DILATION N/A 02/16/2016   Procedure: Elease Hashimoto DILATION;  Surgeon: Corbin Ade, MD;  Location: [facility];  Service: Endoscopy;  Laterality: N/A;   MEDIASTINOSCOPY  05/15/2012   Procedure: MEDIASTINOSCOPY;  Surgeon: Loreli Slot, MD;  Location: [facility] OR;  Service: Thoracic;  Laterality: N/A;    Family History  Problem Relation Age of Onset   Heart failure Mother    Stroke Mother    Heart attack Mother 64   Heart failure Father    Heart attack Father 25       CABG   Breast cancer Sister    Seizures Brother    Breast cancer Sister    Liver disease Neg Hx    Colon cancer Neg Hx     Social History:  reports that he quit smoking about 9 years ago. His smoking use included cigarettes. He has a 30.00 pack-year smoking history. He has never used smokeless tobacco. He reports that he does not drink alcohol and does not use drugs.  Allergies: No Known  Allergies  Medications: Prior to Admission medications   Medication Sig Start Date End Date Taking? Authorizing Provider  baclofen (LIORESAL) 20 MG tablet Take 1 tablet (20 mg total) by mouth 3 (three) times daily. 08/09/14  Yes Penumalli, Glenford Bayley, MD  Biotin 11914 MCG TABS Take 50,000 mcg by mouth daily.   Yes [provider]  buPROPion (WELLBUTRIN XL) 150 MG 24 hr tablet Take 150 mg by mouth daily. 04/20/21  Yes [provider]  calcium carbonate (TUMS - DOSED IN MG ELEMENTAL CALCIUM) 500 MG chewable tablet Chew 1 tablet by mouth daily as needed for indigestion or heartburn. Reported on 02/03/2016   Yes [provider]  cyanocobalamin (,VITAMIN B-12,) 1000 MCG/ML injection Inject 1,000 mcg into the muscle every 30 (thirty) days. 06/05/21  Yes [provider]  diazepam (VALIUM) 10 MG tablet Take 10 mg by mouth 4 (four) times daily.  Yes [provider]  docusate sodium (COLACE) 100 MG capsule Take 200 mg by mouth 2 (two) times daily as needed for mild constipation. Reported on 02/03/2016   Yes [provider]  losartan (COZAAR) 100 MG tablet Take 50 mg by mouth every morning.    Yes [provider]  Probiotic Product (PROBIOTIC DAILY PO) Take 1 capsule by mouth daily.   Yes [provider]  traMADol (ULTRAM) 50 MG tablet Take 100 mg by mouth every 6 (six) hours as needed for moderate pain.    Yes [provider]  olmesartan (BENICAR) 40 MG tablet Take 40 mg by mouth daily. Patient not taking: Reported on 07/06/2021    [provider]  PROAIR HFA 108 (90 BASE) MCG/ACT inhaler INHALE TWO PUFFS INTO THE LUNGS EVERY SIX HOURS AS NEEDED FOR WHEEZING Patient not taking: Reported on 07/06/2021 04/08/14   Leslye Peer, MD  Vitamin D, Ergocalciferol, (DRISDOL) 50000 UNITS CAPS capsule Take 50,000 Units by mouth every 7 (seven) days. Takes on Wedneday Patient not taking: No sig reported    [provider]     Scheduled Meds:   stroke: mapping our early stages of recovery book   Does not apply Once   vitamin C  500 mg Oral Daily   azithromycin  500 mg Oral Daily   Followed by   Melene Muller ON 07/08/2021] azithromycin  250 mg Oral Daily   baclofen  20 mg Oral TID   guaiFENesin  1,200 mg Oral BID   heparin  5,000 Units Subcutaneous Q8H   Ipratropium-Albuterol  1 puff Inhalation BID   losartan  50 mg Oral q morning   methylPREDNISolone (SOLU-MEDROL) injection  40 mg Intravenous Q12H   saccharomyces boulardii  250 mg Oral BID   vitamin B-12  1,500 mcg Oral Daily   Vitamin D (Ergocalciferol)  50,000 Units Oral Q7 days   zinc sulfate  220 mg Oral Daily   Continuous Infusions: PRN Meds:.acetaminophen **OR** acetaminophen (TYLENOL) oral liquid 160 mg/5 mL **OR** acetaminophen, albuterol, calcium carbonate, diazepam, guaiFENesin-dextromethorphan, senna-docusate     Results for orders placed or performed during the hospital encounter of 07/06/21 (from the past 48 hour(s))  Glucose, capillary     Status: Abnormal   Collection Time: 07/06/21  1:54 AM  Result Value Ref Range   Glucose-Capillary 108 (H) 70 - 99 mg/dL    Comment: Glucose reference range applies only to samples taken after fasting for at least 8 hours.  Ethanol     Status: None   Collection Time: 07/06/21  1:55 AM  Result Value Ref Range   Alcohol, Ethyl (B) <10 <10 mg/dL    Comment: (NOTE) Lowest detectable limit for serum alcohol is 10 mg/dL.  For medical purposes only. Performed at [facility], [address], [place], [place] [address]   Protime-INR     Status: None   Collection Time: 07/06/21  1:55 AM  Result Value Ref Range   Prothrombin Time 13.8 11.4 - 15.2 seconds   INR 1.1 0.8 - 1.2    Comment: (NOTE) INR goal varies based on device and disease states. Performed at [facility], [address], [place], [place] [address]   APTT     Status: None   Collection Time: 07/06/21  1:55 AM  Result Value Ref Range   aPTT  27 24 - 36 seconds    Comment: Performed at [facility], [address] [place], [place] [address]  CBC  Status: None   Collection Time: 07/06/21  1:55 AM  Result Value Ref Range   WBC 6.4 4.0 - 10.5 K/uL   RBC 4.51 4.22 - 5.81 MIL/uL   Hemoglobin 14.1 13.0 - 17.0 g/dL   HCT 16.1 09.6 - 04.5 %   MCV 91.4 80.0 - 100.0 fL   MCH 31.3 26.0 - 34.0 pg   MCHC 34.2 30.0 - 36.0 g/dL   RDW 40.9 81.1 - 91.4 %   Platelets 168 150 - 400 K/uL   nRBC 0.0 0.0 - 0.2 %    Comment: Performed at [facility], [address] [place], [place] [address]  Differential     Status: Abnormal   Collection Time: 07/06/21  1:55 AM  Result Value Ref Range   Neutrophils Relative % 81 %   Neutro Abs 5.2 1.7 - 7.7 K/uL   Lymphocytes Relative 5 %   Lymphs Abs 0.3 (L) 0.7 - 4.0 K/uL   Monocytes Relative 12 %   Monocytes Absolute 0.8 0.1 - 1.0 K/uL   Eosinophils Relative 1 %   Eosinophils Absolute 0.0 0.0 - 0.5 K/uL   Basophils Relative 0 %   Basophils Absolute 0.0 0.0 - 0.1 K/uL   Immature Granulocytes 1 %   Abs Immature Granulocytes 0.03 0.00 - 0.07 K/uL    Comment: Performed at [facility], [address], [place], [place] [address]  Comprehensive metabolic panel     Status: Abnormal   Collection Time: 07/06/21  1:55 AM  Result Value Ref Range   Sodium 137 135 - 145 mmol/L   Potassium 3.8 3.5 - 5.1 mmol/L   Chloride 104 98 - 111 mmol/L   CO2 26 22 - 32 mmol/L   Glucose, Bld 118 (H) 70 - 99 mg/dL    Comment: Glucose reference range applies only to samples taken after fasting for at least 8 hours.   BUN 7 (L) 8 - 23 mg/dL   Creatinine, Ser 8.65 0.61 - 1.24 mg/dL   Calcium 8.9 8.9 - 78.4 mg/dL   Total Protein 7.0 6.5 - 8.1 g/dL   Albumin 4.3 3.5 - 5.0 g/dL   AST 51 (H) 15 - 41 U/L   ALT 60 (H) 0 - 44 U/L   Alkaline Phosphatase 75 38 - 126 U/L   Total Bilirubin 0.7 0.3 - 1.2 mg/dL   GFR, Estimated >69 >62 mL/min    Comment: (NOTE) Calculated using the CKD-EPI Creatinine Equation (2021)    Anion  gap 7 5 - 15    Comment: Performed at [facility], [address], [place], [place] [address]  CK     Status: None   Collection Time: 07/06/21  1:55 AM  Result Value Ref Range   Total CK 203 49 - 397 U/L    Comment: Performed at [facility], [address] [place], [place] [address]  Troponin I (High Sensitivity)     Status: None   Collection Time: 07/06/21  1:55 AM  Result Value Ref Range   Troponin I (High Sensitivity) 3 <18 ng/L    Comment: (NOTE) Elevated high sensitivity troponin I (hsTnI) values and significant  changes across serial measurements may suggest ACS but many other  chronic and acute conditions are known to elevate hsTnI results.  Refer to the "Links" section for chest pain algorithms and additional  guidance. Performed at [facility], [address], [place], [place] [address]   D-dimer, quantitative     Status: Abnormal   Collection Time: 07/06/21  1:55 AM  Result Value Ref Range   D-Dimer, Quant 1.15 (H) 0.00 - 0.50 ug/mL-FEU    Comment: (NOTE) At the manufacturer cut-off value of 0.5 g/mL FEU, this assay has a negative predictive value of 95-100%.This assay is intended for use in conjunction with a clinical pretest probability (PTP) assessment model to exclude pulmonary embolism (PE) and deep venous thrombosis (DVT) in outpatients suspected of PE or DVT. Results should be correlated with clinical presentation. Performed at [facility], [address] [place], [place] [address]   HIV Antibody (routine testing w rflx)     Status: None   Collection Time: 07/06/21  1:55 AM  Result Value Ref Range   HIV Screen 4th Generation wRfx Non Reactive Non Reactive    Comment: Performed at [facility], [address] [place], [place] [address]  Resp Panel by RT-PCR (Flu A&B, Covid) Nasopharyngeal Swab     Status: Abnormal   Collection Time: 07/06/21  2:25 AM   Specimen: Nasopharyngeal Swab; Nasopharyngeal(NP) swabs in vial transport medium  Result Value Ref  Range   SARS Coronavirus 2 by RT PCR POSITIVE (A) NEGATIVE    Comment: RESULT CALLED TO, READ BACK BY AND VERIFIED WITH: SAPPELT,J @ 0333 ON 07/06/21 BY JUW (NOTE) SARS-CoV-2 target nucleic acids are DETECTED.  The SARS-CoV-2 RNA is generally detectable in upper respiratory specimens during the acute phase of infection. Positive results are indicative of the presence of the identified virus, but do not rule out bacterial infection or co-infection with other pathogens not detected by the test. Clinical correlation with patient history and other diagnostic information is necessary to determine patient infection status. The expected result is Negative.  Fact Sheet for Patients: BloggerCourse.com  Fact Sheet for Healthcare Providers: SeriousBroker.it  This test is not yet approved or cleared by the [place] FDA and  has been authorized for detection and/or diagnosis of SARS-CoV-2 by FDA under an Emergency Use Authorization (EUA).  This EUA will remain in effect (meaning this test can b e used) for the duration of  the COVID-19 declaration under Section 564(b)(1) of the Act, 21 U.S.C. section 360bbb-3(b)(1), unless the authorization is terminated or revoked sooner.     Influenza A by PCR NEGATIVE NEGATIVE   Influenza B by PCR NEGATIVE NEGATIVE    Comment: (NOTE) The Xpert Xpress SARS-CoV-2/FLU/RSV plus assay is intended as an aid in the diagnosis of influenza from Nasopharyngeal swab specimens and should not be used as a sole basis for treatment. Nasal washings and aspirates are unacceptable for Xpert Xpress SARS-CoV-2/FLU/RSV testing.  Fact Sheet for Patients: BloggerCourse.com  Fact Sheet for Healthcare Providers: SeriousBroker.it  This test is not yet approved or cleared by the [place] FDA and has been authorized for detection and/or diagnosis of SARS-CoV-2 by FDA  under an Emergency Use Authorization (EUA). This EUA will remain in effect (meaning this test can be used) for the duration of the COVID-19 declaration under Section 564(b)(1) of the Act, 21 U.S.C. section 360bbb-3(b)(1), unless the authorization is terminated or revoked.  Performed at [facility], [address], [place], [place] [address]   Troponin I (High Sensitivity)     Status: None   Collection Time: 07/06/21  5:05 AM  Result Value Ref Range   Troponin I (High Sensitivity) 4 <18 ng/L    Comment: (NOTE) Elevated high sensitivity troponin I (hsTnI) values and significant  changes across serial measurements may suggest ACS but many other  chronic and acute conditions are  known to elevate hsTnI results.  Refer to the "Links" section for chest pain algorithms and additional  guidance. Performed at [facility], [address] [place], [place] [address]   Urine rapid drug screen (hosp performed)     Status: Abnormal   Collection Time: 07/06/21  2:39 PM  Result Value Ref Range   Opiates POSITIVE (A) NONE DETECTED   Cocaine NONE DETECTED NONE DETECTED   Benzodiazepines POSITIVE (A) NONE DETECTED   Amphetamines NONE DETECTED NONE DETECTED   Tetrahydrocannabinol NONE DETECTED NONE DETECTED   Barbiturates NONE DETECTED NONE DETECTED    Comment: (NOTE) DRUG SCREEN FOR MEDICAL PURPOSES ONLY.  IF CONFIRMATION IS NEEDED FOR ANY PURPOSE, NOTIFY LAB WITHIN 5 DAYS.  LOWEST DETECTABLE LIMITS FOR URINE DRUG SCREEN Drug Class                     Cutoff (ng/mL) Amphetamine and metabolites    1000 Barbiturate and metabolites    200 Benzodiazepine                 200 Tricyclics and metabolites     300 Opiates and metabolites        300 Cocaine and metabolites        300 THC                            50 Performed at [facility], [address] [place], [place] [address]   Urinalysis, Routine w reflex microscopic Urine, Clean Catch     Status: None   Collection Time: 07/06/21  2:39 PM   Result Value Ref Range   Color, Urine YELLOW YELLOW   APPearance CLEAR CLEAR   Specific Gravity, Urine 1.009 1.005 - 1.030   pH 7.0 5.0 - 8.0   Glucose, UA NEGATIVE NEGATIVE mg/dL   Hgb urine dipstick NEGATIVE NEGATIVE   Bilirubin Urine NEGATIVE NEGATIVE   Ketones, ur NEGATIVE NEGATIVE mg/dL   Protein, ur NEGATIVE NEGATIVE mg/dL   Nitrite NEGATIVE NEGATIVE   Leukocytes,Ua NEGATIVE NEGATIVE    Comment: Performed at [facility], [address], [place], [place] [address]  Hemoglobin A1c     Status: None   Collection Time: 07/07/21  6:57 AM  Result Value Ref Range   Hgb A1c MFr Bld 5.4 4.8 - 5.6 %    Comment: (NOTE) Pre diabetes:          5.7%-6.4%  Diabetes:              >6.4%  Glycemic control for   <7.0% adults with diabetes    Mean Plasma Glucose 108.28 mg/dL    Comment: Performed at [facility], [address] [place], [place] [address]  Lipid panel     Status: Abnormal   Collection Time: 07/07/21  6:58 AM  Result Value Ref Range   Cholesterol 167 0 - 200 mg/dL   Triglycerides 66 <549 mg/dL   HDL 51 >82 mg/dL   Total CHOL/HDL Ratio 3.3 RATIO   VLDL 13 0 - 40 mg/dL   LDL Cholesterol 641 (H) 0 - 99 mg/dL    Comment:        Total Cholesterol/HDL:CHD Risk Coronary Heart Disease Risk Table                     Men   Women  1/2 Average Risk   3.4   3.3  Average Risk  5.0   4.4  2 X Average Risk   9.6   7.1  3 X Average Risk  23.4   11.0        Use the calculated Patient Ratio above and the CHD Risk Table to determine the patient's CHD Risk.        ATP III CLASSIFICATION (LDL):  <100     mg/dL   Optimal  462-703  mg/dL   Near or Above                    Optimal  130-159  mg/dL   Borderline  500-938  mg/dL   High  >182     mg/dL   Very High Performed at [facility], [address] [place], [place] [address]   TSH     Status: Abnormal   Collection Time: 07/07/21  6:59 AM  Result Value Ref Range   TSH 0.144 (L) 0.350 - 4.500 uIU/mL    Comment:  Performed by a 3rd Generation assay with a functional sensitivity of <=0.01 uIU/mL. Performed at [facility], [address], [place], [place] [address]   CBC     Status: None   Collection Time: 07/07/21  7:20 AM  Result Value Ref Range   WBC 8.9 4.0 - 10.5 K/uL   RBC 4.96 4.22 - 5.81 MIL/uL   Hemoglobin 15.6 13.0 - 17.0 g/dL   HCT 93.8 10.1 - 75.1 %   MCV 94.2 80.0 - 100.0 fL   MCH 31.5 26.0 - 34.0 pg   MCHC 33.4 30.0 - 36.0 g/dL   RDW 02.5 85.2 - 77.8 %   Platelets 180 150 - 400 K/uL   nRBC 0.0 0.0 - 0.2 %    Comment: Performed at [facility], [address], [place], [place] [address]  Comprehensive metabolic panel     Status: Abnormal   Collection Time: 07/07/21  7:20 AM  Result Value Ref Range   Sodium 139 135 - 145 mmol/L   Potassium 3.7 3.5 - 5.1 mmol/L   Chloride 103 98 - 111 mmol/L   CO2 25 22 - 32 mmol/L   Glucose, Bld 93 70 - 99 mg/dL    Comment: Glucose reference range applies only to samples taken after fasting for at least 8 hours.   BUN 10 8 - 23 mg/dL   Creatinine, Ser 3.61 0.61 - 1.24 mg/dL   Calcium 9.3 8.9 - 44.3 mg/dL   Total Protein 8.1 6.5 - 8.1 g/dL   Albumin 4.7 3.5 - 5.0 g/dL   AST 154 (H) 15 - 41 U/L   ALT 162 (H) 0 - 44 U/L   Alkaline Phosphatase 90 38 - 126 U/L   Total Bilirubin 0.6 0.3 - 1.2 mg/dL   GFR, Estimated >00 >86 mL/min    Comment: (NOTE) Calculated using the CKD-EPI Creatinine Equation (2021)    Anion gap 11 5 - 15    Comment: Performed at [facility], [address], [place], [place] [address]  Vitamin B12     Status: Abnormal   Collection Time: 07/07/21  7:20 AM  Result Value Ref Range   Vitamin B-12 1,155 (H) 180 - 914 pg/mL    Comment: (NOTE) This assay is not validated for testing neonatal or myeloproliferative syndrome specimens for Vitamin B12 levels. Performed at [facility], [address] [place], [place] [address]   Folate, serum, performed at [facility] lab     Status: None   Collection Time: 07/07/21  7:20 AM   Result Value Ref Range   Folate 18.4 >5.9 ng/mL    Comment: Performed at [facility], [address] [place], [place] [address]    Studies/Results:   BRAIN C SPINE MRI  W/WO IMPRESSION: 1. Progression of periventricular and subcortical T2 hyperintensities bilaterally compatible with progression of multiple sclerosis. The largest lesion is in the anterior left corona radiata measuring 7 mm. No restricted diffusion or enhancement is present to suggest active demyelination. 2. Stable appearance of T2 hyperintense lesion along the right side of the cord at C3-4. 3. No new lesions. 4. Progressive moderate left foraminal narrowing at C3-4. 5. Moderate left and mild right foraminal narrowing at C4-5. 6. Moderate foraminal narrowing bilaterally at C6-7.       Brain MRI is reviewed in person. The scan today is reviewed in comparison with the scan done 2015. The white matter burden seems to likely be more on the more recent scan especially involving the periventricular region the posterior horn of the lateral ventricles.  Few of these are associated with encephalomalacia/black holes. No enhancement is noted in no abnormalities on diffusion. No hemorrhages noted. There is increased signal seen C3.    Vinod Mikesell A. Gerilyn Pilgrim, M.D.  Diplomate, Biomedical engineer of Psychiatry and Neurology ( Neurology). 07/07/2021, 5:11 PM

## 2021-07-07 NOTE — Evaluation (Signed)
Occupational Therapy Evaluation Patient Details Name: Chad Vasquez MRN: [id number] DOB: 1961-02-15 Today's Date: 07/07/2021   History of Present Illness Chad Vasquez  is a 60 y.o. male, with history of anxiety and depression, COPD, coronary artery disease, GERD, hypertension, hyperlipidemia, multiple sclerosis, substance abuse, sarcoidosis, Schatzki's ring, and sleep apnea on CPAP presents the ED with a chief complaint of weakness.  Patient is a poor historian and is difficult to discern baseline.  He reports he has had a headache, weakness, and pain in his legs.  He has had episodes like this in the past.  This episode started yesterday.  Been progressively worse since it started.  He reports that he could not stand up, and that is why he called EMS.  Patient is wheelchair-bound, but usually able to stand on his left leg to transfer from wheelchair to recliner chair or to commode.  His left leg has been too weak to stand on.  Its not related to the pain, as the pain is chronic for him.  He reports that he has burning and stabbing pain in the bilateral legs.  Its been there for years.  Right now is a little better than it was before he left the house.  Patient reports that his headache is also like his normal headache.  Patient also reports gradually worsening right hand weakness.  He reports that this has been going on for a few months.  He has had no cough, no fevers.  He does report minimal dyspnea the morning of presentation.  He had no loss of taste or smell.  He reports that his home health aide called them yesterday to tell them that she had to take the day off today because she was running a fever and needed to be checked for COVID.   Clinical Impression   Pt agreeable to OT/PT co-evaluation. Pt reported 9/10 headache pain at start of session. Pt able to complete supine to sit bed mobility Mod I with HOB raised with extended time. Pt required min to mod A for scooting transfer from EOB to chair.  Pt demonstrates functional use of L UE with mild weakness and limited use of R UE due to previous condition. Pt left in chair with call bell within reach. Pt will benefit from continued OT in the hospital and recommended venue below to increase strength, balance, and endurance for safe ADL's.        Recommendations for follow up therapy are one component of a multi-disciplinary discharge planning process, led by the attending physician.  Recommendations may be updated based on patient status, additional functional criteria and insurance authorization.   Follow Up Recommendations  Home health OT;Supervision - Intermittent    Equipment Recommendations  None recommended by OT           Precautions / Restrictions Precautions Precautions: Fall Restrictions Weight Bearing Restrictions: No      Mobility Bed Mobility Overal bed mobility: Modified Independent             General bed mobility comments: Mod I with HOB elevated and extended time. (supine to sitting at EOB)    Transfers Overall transfer level: Needs assistance   Transfers: Lateral/Scoot Transfers          Lateral/Scoot Transfers: Min assist;Mod assist General transfer comment: labored movement; pt would likely improve status with use of sliding board.    Balance Overall balance assessment: Needs assistance Sitting-balance support: No upper extremity supported;Feet supported Sitting balance-Leahy Scale: Good Sitting balance -  Comments: sitting at EOB                                   ADL either performed or assessed with clinical judgement   ADL Overall ADL's : Needs assistance/impaired                     Lower Body Dressing: Bed level;Maximal assistance;Total assistance Lower Body Dressing Details (indicate cue type and reason): Pt required max to total assist this date at bed level. Pt reports being able to don socks at home but does not do this very often. Toilet Transfer:  Minimal assistance;Moderate assistance;Squat-pivot Toilet Transfer Details (indicate cue type and reason): Simulated via scoot/squat pivot transfer to EOB to chair.                 Vision Baseline Vision/History: 1 Wears glasses Ability to See in Adequate Light: 0 Adequate Patient Visual Report: No change from baseline       Perception     Praxis      Pertinent Vitals/Pain Pain Assessment: 0-10 Pain Score: 9  Pain Location: head Pain Descriptors / Indicators: Headache Pain Intervention(s): Limited activity within patient's tolerance;Monitored during session;Repositioned     Hand Dominance Left   Extremity/Trunk Assessment Upper Extremity Assessment Upper Extremity Assessment: RUE deficits/detail;LUE deficits/detail RUE Deficits / Details: 2+/5 shoulder A/ROM; 3/5 elbow flexion extension; 3+/5 grip. 2+/5 wrist extension; 3/5 wrist flexoin. RUE Coordination: decreased fine motor;decreased gross motor LUE Deficits / Details: 4+/5 grossly LUE Coordination: WNL   Lower Extremity Assessment Lower Extremity Assessment: Defer to PT evaluation   Cervical / Trunk Assessment Cervical / Trunk Assessment: Normal   Communication Communication Communication: No difficulties   Cognition Arousal/Alertness: Awake/alert Behavior During Therapy: WFL for tasks assessed/performed Overall Cognitive Status: Within Functional Limits for tasks assessed                                     General Comments       Exercises     Shoulder Instructions      Home Living Family/patient expects to be discharged to:: Private residence Living Arrangements: Spouse/significant other Available Help at Discharge: Other (Comment);Available PRN/intermittently (significant other) Type of Home: House (Double wide) Home Access: Ramped entrance     Home Layout: One level     Bathroom Shower/Tub: Chief Strategy Officer: Handicapped height Bathroom Accessibility:  Yes   Home Equipment: Environmental consultant - 2 wheels;Wheelchair - power;Wheelchair - manual;Grab bars - toilet;Grab bars - tub/shower;Tub bench   Additional Comments: Significant other availble at most times other than when running errands outside the house.      Prior Functioning/Environment Level of Independence: Needs assistance  Gait / Transfers Assistance Needed: Household mobility with power chair. Pt is able to scoot from bed to chair and pull self to toilet and tub bench. ADL's / Homemaking Assistance Needed: Pt reports independence with ADL's; assisted by significant other for IADL's; cleaning person comes Monday through Friday for 4 hours as well.            OT Problem List: Decreased strength;Decreased range of motion;Decreased activity tolerance;Impaired balance (sitting and/or standing);Impaired UE functional use      OT Treatment/Interventions: Self-care/ADL training;Therapeutic exercise;Therapeutic activities;Patient/family education    OT Goals(Current goals can be found in the care plan section) Acute  Rehab OT Goals Patient Stated Goal: return home OT Goal Formulation: With patient Time For Goal Achievement: 07/21/21 Potential to Achieve Goals: Fair  OT Frequency: Min 2X/week   Barriers to D/C:            Co-evaluation PT/OT/SLP Co-Evaluation/Treatment: Yes Reason for Co-Treatment: Complexity of the patient's impairments (multi-system involvement)   OT goals addressed during session: ADL's and self-care      AM-PAC OT "6 Clicks" Daily Activity     Outcome Measure Help from another person eating meals?: None Help from another person taking care of personal grooming?: A Little Help from another person toileting, which includes using toliet, bedpan, or urinal?: A Little Help from another person bathing (including washing, rinsing, drying)?: A Little Help from another person to put on and taking off regular upper body clothing?: None Help from another person to put on  and taking off regular lower body clothing?: A Little 6 Click Score: 20   End of Session    Activity Tolerance: Patient tolerated treatment well Patient left: in chair;with call bell/phone within reach  OT Visit Diagnosis: Muscle weakness (generalized) (M62.81);Other symptoms and signs involving the nervous system (R29.898)                Time: 0900-0930 OT Time Calculation (min): 30 min Charges:  OT General Charges $OT Visit: 1 Visit OT Evaluation $OT Eval Moderate Complexity: 1 Mod  Reggie Bise OT, MOT  Danie Chandler 07/07/2021, 11:38 AM

## 2021-07-07 NOTE — Plan of Care (Signed)
  Problem: Acute Rehab OT Goals (only OT should resolve) Goal: Pt. Will Perform Lower Body Bathing Flowsheets (Taken 07/07/2021 1141) Pt Will Perform Lower Body Bathing:  with modified independence  with adaptive equipment Goal: Pt. Will Perform Lower Body Dressing Flowsheets (Taken 07/07/2021 1141) Pt Will Perform Lower Body Dressing:  with modified independence  with adaptive equipment Goal: Pt. Will Transfer To Toilet Flowsheets (Taken 07/07/2021 1141) Pt Will Transfer to Toilet:  with supervision  with transfer board  squat pivot transfer  anterior/posterior transfer Goal: Pt/Caregiver Will Perform Home Exercise Program Flowsheets (Taken 07/07/2021 1141) Pt/caregiver will Perform Home Exercise Program:  Increased ROM  Right Upper extremity  With Supervision  Jhamari Markowicz OT, MOT

## 2021-07-07 NOTE — Plan of Care (Signed)

## 2021-07-08 DIAGNOSIS — G35 Multiple sclerosis: Secondary | ICD-10-CM

## 2021-07-08 DIAGNOSIS — U071 COVID-19: Principal | ICD-10-CM

## 2021-07-08 LAB — COMPREHENSIVE METABOLIC PANEL
ALT: 111 U/L — ABNORMAL HIGH (ref 0–44)
AST: 86 U/L — ABNORMAL HIGH (ref 15–41)
Albumin: 4 g/dL (ref 3.5–5.0)
Alkaline Phosphatase: 81 U/L (ref 38–126)
Anion gap: 9 (ref 5–15)
BUN: 20 mg/dL (ref 8–23)
CO2: 24 mmol/L (ref 22–32)
Calcium: 9.3 mg/dL (ref 8.9–10.3)
Chloride: 108 mmol/L (ref 98–111)
Creatinine, Ser: 0.91 mg/dL (ref 0.61–1.24)
GFR, Estimated: >60 mL/min (ref >60)
Glucose, Bld: 134 mg/dL — ABNORMAL HIGH (ref 70–99)
Potassium: 4.2 mmol/L (ref 3.5–5.1)
Sodium: 141 mmol/L (ref 135–145)
Total Bilirubin: 1.2 mg/dL (ref 0.3–1.2)
Total Protein: 7.1 g/dL (ref 6.5–8.1)

## 2021-07-08 LAB — CBC
HCT: 44.5 % (ref 39.0–52.0)
Hemoglobin: 15 g/dL (ref 13.0–17.0)
MCH: 30.5 pg (ref 26.0–34.0)
MCHC: 33.7 g/dL (ref 30.0–36.0)
MCV: 90.4 fL (ref 80.0–100.0)
Platelets: 193 10*3/uL (ref 150–400)
RBC: 4.92 MIL/uL (ref 4.22–5.81)
RDW: 13.2 % (ref 11.5–15.5)
WBC: 10.2 10*3/uL (ref 4.0–10.5)
nRBC: 0 % (ref 0.0–0.2)

## 2021-07-08 LAB — MAGNESIUM: Magnesium: 2.1 mg/dL (ref 1.7–2.4)

## 2021-07-08 LAB — C-REACTIVE PROTEIN: CRP: 5.8 mg/dL — ABNORMAL HIGH (ref <1.0)

## 2021-07-08 LAB — PHOSPHORUS: Phosphorus: 3 mg/dL (ref 2.5–4.6)

## 2021-07-08 LAB — T4, FREE: Free T4: 1.03 ng/dL (ref 0.61–1.12)

## 2021-07-08 LAB — FERRITIN: Ferritin: 642 ng/mL — ABNORMAL HIGH (ref 24–336)

## 2021-07-08 LAB — SEDIMENTATION RATE: Sed Rate: 12 mm/hr (ref 0–16)

## 2021-07-08 LAB — D-DIMER, QUANTITATIVE: D-Dimer, Quant: 1.75 ug/mL-FEU — ABNORMAL HIGH (ref 0.00–0.50)

## 2021-07-08 MED ORDER — METHYLPREDNISOLONE SODIUM SUCC 40 MG IJ SOLR
40.0000 mg | Freq: Once | INTRAMUSCULAR | Status: AC
  Administered 2021-07-08: 40 mg via INTRAVENOUS
  Filled 2021-07-08: qty 1

## 2021-07-08 MED ORDER — METHYLPREDNISOLONE SODIUM SUCC 1000 MG IJ SOLR
1000.0000 mg | INTRAMUSCULAR | Status: DC
  Administered 2021-07-08: 1000 mg via INTRAVENOUS
  Filled 2021-07-08 (×2): qty 16

## 2021-07-08 MED ORDER — NIRMATRELVIR/RITONAVIR (PAXLOVID)TABLET
3.0000 | ORAL_TABLET | Freq: Two times a day (BID) | ORAL | Status: DC
Start: 2021-07-08 — End: 2021-07-10
  Administered 2021-07-08 – 2021-07-10 (×4): 3 via ORAL
  Filled 2021-07-08: qty 30

## 2021-07-08 MED ORDER — METHYLPREDNISOLONE SODIUM SUCC 125 MG IJ SOLR: 80.0000 mg | Freq: Two times a day (BID) | INTRAMUSCULAR | Status: DC

## 2021-07-08 NOTE — Progress Notes (Signed)
PROGRESS NOTE  SHAHID FLORI JSH:[id number] DOB: May 17, 1961 DOA: 07/06/2021 PCP: Assunta Found, MD  Brief History:  Chad Vasquez  is a 60 y.o. male, with history of chronic anxiety/depression, former tobacco use (1 pack/day since the age of 71, quit August 2022) COPD, coronary artery disease, GERD, hypertension, hyperlipidemia, multiple sclerosis, substance abuse, sarcoidosis, Schatzki's ring, and sleep apnea on CPAP presents to [facility] ED with a chief complaint of weakness.  He reports that he could not stand up, and that is why he called EMS.  Patient is wheelchair-bound, but usually able to stand on his left leg to transfer from wheelchair to recliner chair or to commode.  His left leg has been too weak to stand on.  Endorses intermittent headaches.  His home health aide called on 07/05/21 to tell them that she had to take the day off today because she was running a fever and needed to be checked for COVID.  Work-up in the ED revealed possible MS flare seen on MRI brain and MRI cervical spine.  COVID-19 screening test positive on 07/06/2021.   07/07/2021: Seen and examined at his bedside.  He is worried about not being on antiviral.  Paxlovid held due to acute transaminitis.  07/08/21--feeling better and able to move arms and legs again.  Expressed desire to go home if possible.  States he is "50-75% better"  Assessment/Plan: Generalized weakness, suspect possibly related to MS flare, seen on MRI brain and cervical spine. Started on IV Solu-Medrol Neurology has been consulted>>treat 3 days with solumedrol 1 gram x 3 days PT OT recommended home health PT OT Fall precautions. -MR Brain/Cspine---Progression of periventricular and subcortical T2 hyperintensities bilaterally compatible with progression of multiple sclerosis. The largest lesion is in the anterior left corona radiata measuring 7 mm. No restricted diffusion or enhancement is present to suggest active demyelination.   COVID-19  viral infection Mild cough, not hypoxic -personally reviewed chest x-ray done on admission no lobular infiltrates noted -continue Z-Pak for COPD exacerbation. Continue IV steroids due to possible MS flare Bronchodilators Antitussives Incentive spirometer/flutter valve Mobilize as tolerated -CRP 5.8>> -D-Dimer 1.75>>\ -Ferritin 642>> -discussed with neurology--wants to start antiviral concomitant to high dose steroids   Elevated liver chemistries, suspect in the setting of COVID-19 viral infection -AST ALT improving Avoid hepatotoxic agents Antivirals held to avoid worsening of LFTs. Continue to closely monitor LFTs.   Euthyroid sick syndrome TSH 0.144 Obtain free T4--1.03 -repeat TFTs in 1 months   COPD exacerbation in the setting of COVID-19 viral infection and former tobacco use. Management as stated above Quit smoking in August. -continue steroids   Chronic anxiety/depression Resume home Valium   Essential hypertension BP is currently at goal Resume home losartan   Obesity BMI 32 Recommend weight loss outpatient with regular physical activity and healthy dieting.   Generalized weakness PT OT assessed and recommended home health PT OT Fall precautions TOC consulted to assist with home health services for DC planning.    Status is: Inpatient  Remains inpatient appropriate because:Inpatient level of care appropriate due to severity of illness  Dispo:  Patient From: Home  Planned Disposition: Home with Health Care Svc  Medically stable for discharge: No          Family Communication:   no Family at bedside  Consultants:  none  Code Status:  FUL  DVT Prophylaxis:  [place] Heparin    Procedures: As Listed in Progress Note Above  Antibiotics: None       Subjective: Patient states his arm and leg are feeling "50-75%" better.  Patient denies fevers, chills, headache, chest pain, dyspnea, nausea, vomiting, diarrhea, abdominal pain, dysuria,  hematuria, hematochezia, and melena.   Objective: Vitals:   07/07/21 1424 07/07/21 2145 07/08/21 0440 07/08/21 0841  BP: 130/86 127/61 131/66 (!) 147/83  Pulse: (!) 106 96 85 86  Resp: 18 17 18    Temp: 98.5 F (36.9 C) 98.2 F (36.8 C) 99.1 F (37.3 C) 98.7 F (37.1 C)  TempSrc: Oral Oral Oral   SpO2: 96% 94% 94% 97%  Weight:      Height:        Intake/Output Summary (Last 24 hours) at 07/08/2021 1721 Last data filed at 07/08/2021 1633 Gross per 24 hour  Intake 720 ml  Output 1800 ml  Net -1080 ml   Weight change:  Exam:  General:  Pt is alert, follows commands appropriately, not in acute distress HEENT: No icterus, No thrush, No neck mass, [place]/AT Cardiovascular: RRR, S1/S2, no rubs, no gallops Respiratory: bibasilar rales.  No wheeze Abdomen: Soft/+BS, non tender, non distended, no guarding Extremities: No edema, No lymphangitis, No petechiae, No rashes, no synovitis   Data Reviewed: I have personally reviewed following labs and imaging studies Basic Metabolic Panel: Recent Labs  Lab 07/06/21 0155 07/07/21 0720 07/08/21 0645  NA 137 139 141  K 3.8 3.7 4.2  CL 104 103 108  CO2 26 25 24   GLUCOSE 118* 93 134*  BUN 7* 10 20  CREATININE 0.84 0.89 0.91  CALCIUM 8.9 9.3 9.3  MG  --   --  2.1  PHOS  --   --  3.0   Liver Function Tests: Recent Labs  Lab 07/06/21 0155 07/07/21 0720 07/08/21 0645  AST 51* 107* 86*  ALT 60* 162* 111*  ALKPHOS 75 90 81  BILITOT 0.7 0.6 1.2  PROT 7.0 8.1 7.1  ALBUMIN 4.3 4.7 4.0   No results for input(s): LIPASE, AMYLASE in the last 168 hours. No results for input(s): AMMONIA in the last 168 hours. Coagulation Profile: Recent Labs  Lab 07/06/21 0155  INR 1.1   CBC: Recent Labs  Lab 07/06/21 0155 07/07/21 0720 07/08/21 0645  WBC 6.4 8.9 10.2  NEUTROABS 5.2  --   --   HGB 14.1 15.6 15.0  HCT 41.2 46.7 44.5  MCV 91.4 94.2 90.4  PLT 168 180 193   Cardiac Enzymes: Recent Labs  Lab 07/06/21 0155  CKTOTAL 203    BNP: Invalid input(s): POCBNP CBG: Recent Labs  Lab 07/06/21 0154  GLUCAP 108*   HbA1C: Recent Labs    07/07/21 0657  HGBA1C 5.4   Urine analysis:    Component Value Date/Time   COLORURINE YELLOW 07/06/2021 1439   APPEARANCEUR CLEAR 07/06/2021 1439   LABSPEC 1.009 07/06/2021 1439   PHURINE 7.0 07/06/2021 1439   GLUCOSEU NEGATIVE 07/06/2021 1439   HGBUR NEGATIVE 07/06/2021 1439   BILIRUBINUR NEGATIVE 07/06/2021 1439   KETONESUR NEGATIVE 07/06/2021 1439   PROTEINUR NEGATIVE 07/06/2021 1439   UROBILINOGEN 0.2 04/15/2013 1108   NITRITE NEGATIVE 07/06/2021 1439   LEUKOCYTESUR NEGATIVE 07/06/2021 1439   Sepsis Labs: @LABRCNTIP (procalcitonin:4,lacticidven:4) ) Recent Results (from the past 240 hour(s))  Resp Panel by RT-PCR (Flu A&B, Covid) Nasopharyngeal Swab     Status: Abnormal   Collection Time: 07/06/21  2:25 AM   Specimen: Nasopharyngeal Swab; Nasopharyngeal(NP) swabs in vial transport medium  Result Value Ref Range Status   SARS  Coronavirus 2 by RT PCR POSITIVE (A) NEGATIVE Final    Comment: RESULT CALLED TO, READ BACK BY AND VERIFIED WITH: SAPPELT,J @ 0333 ON 07/06/21 BY JUW (NOTE) SARS-CoV-2 target nucleic acids are DETECTED.  The SARS-CoV-2 RNA is generally detectable in upper respiratory specimens during the acute phase of infection. Positive results are indicative of the presence of the identified virus, but do not rule out bacterial infection or co-infection with other pathogens not detected by the test. Clinical correlation with patient history and other diagnostic information is necessary to determine patient infection status. The expected result is Negative.  Fact Sheet for Patients: BloggerCourse.com  Fact Sheet for Healthcare Providers: SeriousBroker.it  This test is not yet approved or cleared by the [place] FDA and  has been authorized for detection and/or diagnosis of SARS-CoV-2 by FDA  under an Emergency Use Authorization (EUA).  This EUA will remain in effect (meaning this test can b e used) for the duration of  the COVID-19 declaration under Section 564(b)(1) of the Act, 21 U.S.C. section 360bbb-3(b)(1), unless the authorization is terminated or revoked sooner.     Influenza A by PCR NEGATIVE NEGATIVE Final   Influenza B by PCR NEGATIVE NEGATIVE Final    Comment: (NOTE) The Xpert Xpress SARS-CoV-2/FLU/RSV plus assay is intended as an aid in the diagnosis of influenza from Nasopharyngeal swab specimens and should not be used as a sole basis for treatment. Nasal washings and aspirates are unacceptable for Xpert Xpress SARS-CoV-2/FLU/RSV testing.  Fact Sheet for Patients: BloggerCourse.com  Fact Sheet for Healthcare Providers: SeriousBroker.it  This test is not yet approved or cleared by the [place] FDA and has been authorized for detection and/or diagnosis of SARS-CoV-2 by FDA under an Emergency Use Authorization (EUA). This EUA will remain in effect (meaning this test can be used) for the duration of the COVID-19 declaration under Section 564(b)(1) of the Act, 21 U.S.C. section 360bbb-3(b)(1), unless the authorization is terminated or revoked.  Performed at [facility] [address] [place], [place] [address]      Scheduled Meds:   stroke: mapping our early stages of recovery book   Does not apply Once   vitamin C  500 mg Oral Daily   azithromycin  250 mg Oral Daily   baclofen  20 mg Oral TID   guaiFENesin  1,200 mg Oral BID   heparin  5,000 Units Subcutaneous Q8H   losartan  50 mg Oral q morning   nirmatrelvir/ritonavir EUA  3 tablet Oral BID   saccharomyces boulardii  250 mg Oral BID   vitamin B-12  1,500 mcg Oral Daily   Vitamin D (Ergocalciferol)  50,000 Units Oral Q7 days   zinc sulfate  220 mg Oral Daily   Continuous Infusions:  methylPREDNISolone (SOLU-MEDROL) injection       Procedures/Studies: CT Head Wo Contrast  Result Date: 07/06/2021 CLINICAL DATA:  Weakness with blurred vision and headache. EXAM: CT HEAD WITHOUT CONTRAST TECHNIQUE: Contiguous axial images were obtained from the base of the skull through the vertex without intravenous contrast. COMPARISON:  November 09, 2017 FINDINGS: Brain: There is mild cerebral atrophy with widening of the extra-axial spaces and ventricular dilatation. There are areas of decreased attenuation within the white matter tracts of the supratentorial brain, consistent with microvascular disease changes. Vascular: No hyperdense vessel or unexpected calcification. Skull: Normal. Negative for fracture or focal lesion. Sinuses/Orbits: No acute finding. Other: None. IMPRESSION: 1. Generalized cerebral atrophy. 2. No acute intracranial abnormality. Electronically Signed  By: Aram Candela M.D.   On: 07/06/2021 02:12   MR Brain W and Wo Contrast  Result Date: 07/06/2021 CLINICAL DATA:  Demyelinating disease.  Weakness for 3 days. EXAM: MRI HEAD WITHOUT AND WITH CONTRAST MRI CERVICAL SPINE WITHOUT AND WITH CONTRAST TECHNIQUE: Multiplanar, multiecho pulse sequences of the brain and surrounding structures, and cervical spine, to include the craniocervical junction and cervicothoracic junction, were obtained without and with intravenous contrast. CONTRAST:  55mL GADAVIST GADOBUTROL 1 MMOL/ML IV SOLN COMPARISON:  MR head 04/09/2014 FINDINGS: MRI HEAD FINDINGS Brain: Periventricular T2 lesions have progressed since the prior exam. A peripherally T2 intense lesion in the anterior left corona radiata measures 7 mm, expanded from the prior exam. Subcortical lesions in the left frontal operculum are similar the prior exam. Progressive T2 hyperintense areas noted about the atrium of the right lateral ventricle. No restricted diffusion or enhancement is present. No acute infarct or hemorrhage is present. The internal auditory canals are within normal  limits. The brainstem and cerebellum are within normal limits. No pathologic enhancement is present. Vascular: Flow is present in the major intracranial arteries. Skull and upper cervical spine: The craniocervical junction is normal. Upper cervical spine is within normal limits. Marrow signal is unremarkable. Sinuses/Orbits: The paranasal sinuses and mastoid air cells are clear. The globes and orbits are within normal limits. Other: MRI CERVICAL SPINE FINDINGS Alignment: Physiologic. Vertebrae: Mild endplate degenerative changes are present at C3-4 C4-5. Marrow signal and vertebral body heights are otherwise normal. Cord: T2 hyperintense lesion along the right side of the cord at C3-4 is stable, measuring 15 mm cephalo caudad. Lesion on the left T2-3 is also stable. No new lesions are present. Posterior Fossa, vertebral arteries, paraspinal tissues: Craniocervical junction is normal. Flow is present in the vertebral arteries bilaterally. Visualized intracranial contents are normal. Disc levels: C2-3: Negative. C3-4: A broad-based disc osteophyte complex is present. Uncovertebral and facet spurring contribute to moderate right foraminal narrowing. Progressive moderate left foraminal narrowing is noted. Partial effacement of ventral CSF is present. C4-5: A leftward disc osteophyte complex is present. Uncovertebral spurring is present on the left. Moderate left and mild right foraminal narrowing is present. C5-6: Mild left foraminal narrowing is stable due to uncovertebral spurring. The central canal is patent. C6-7: Moderate foraminal narrowing bilaterally is stable due to uncovertebral disease. The central canal is patent. C7-T1: Negative. IMPRESSION: 1. Progression of periventricular and subcortical T2 hyperintensities bilaterally compatible with progression of multiple sclerosis. The largest lesion is in the anterior left corona radiata measuring 7 mm. No restricted diffusion or enhancement is present to suggest  active demyelination. 2. Stable appearance of T2 hyperintense lesion along the right side of the cord at C3-4. 3. No new lesions. 4. Progressive moderate left foraminal narrowing at C3-4. 5. Moderate left and mild right foraminal narrowing at C4-5. 6. Moderate foraminal narrowing bilaterally at C6-7. Electronically Signed   By: Marin Roberts M.D.   On: 07/06/2021 11:30   MR Cervical Spine W or Wo Contrast  Result Date: 07/06/2021 CLINICAL DATA:  Demyelinating disease.  Weakness for 3 days. EXAM: MRI HEAD WITHOUT AND WITH CONTRAST MRI CERVICAL SPINE WITHOUT AND WITH CONTRAST TECHNIQUE: Multiplanar, multiecho pulse sequences of the brain and surrounding structures, and cervical spine, to include the craniocervical junction and cervicothoracic junction, were obtained without and with intravenous contrast. CONTRAST:  63mL GADAVIST GADOBUTROL 1 MMOL/ML IV SOLN COMPARISON:  MR head 04/09/2014 FINDINGS: MRI HEAD FINDINGS Brain: Periventricular T2 lesions have progressed since the prior  exam. A peripherally T2 intense lesion in the anterior left corona radiata measures 7 mm, expanded from the prior exam. Subcortical lesions in the left frontal operculum are similar the prior exam. Progressive T2 hyperintense areas noted about the atrium of the right lateral ventricle. No restricted diffusion or enhancement is present. No acute infarct or hemorrhage is present. The internal auditory canals are within normal limits. The brainstem and cerebellum are within normal limits. No pathologic enhancement is present. Vascular: Flow is present in the major intracranial arteries. Skull and upper cervical spine: The craniocervical junction is normal. Upper cervical spine is within normal limits. Marrow signal is unremarkable. Sinuses/Orbits: The paranasal sinuses and mastoid air cells are clear. The globes and orbits are within normal limits. Other: MRI CERVICAL SPINE FINDINGS Alignment: Physiologic. Vertebrae: Mild endplate  degenerative changes are present at C3-4 C4-5. Marrow signal and vertebral body heights are otherwise normal. Cord: T2 hyperintense lesion along the right side of the cord at C3-4 is stable, measuring 15 mm cephalo caudad. Lesion on the left T2-3 is also stable. No new lesions are present. Posterior Fossa, vertebral arteries, paraspinal tissues: Craniocervical junction is normal. Flow is present in the vertebral arteries bilaterally. Visualized intracranial contents are normal. Disc levels: C2-3: Negative. C3-4: A broad-based disc osteophyte complex is present. Uncovertebral and facet spurring contribute to moderate right foraminal narrowing. Progressive moderate left foraminal narrowing is noted. Partial effacement of ventral CSF is present. C4-5: A leftward disc osteophyte complex is present. Uncovertebral spurring is present on the left. Moderate left and mild right foraminal narrowing is present. C5-6: Mild left foraminal narrowing is stable due to uncovertebral spurring. The central canal is patent. C6-7: Moderate foraminal narrowing bilaterally is stable due to uncovertebral disease. The central canal is patent. C7-T1: Negative. IMPRESSION: 1. Progression of periventricular and subcortical T2 hyperintensities bilaterally compatible with progression of multiple sclerosis. The largest lesion is in the anterior left corona radiata measuring 7 mm. No restricted diffusion or enhancement is present to suggest active demyelination. 2. Stable appearance of T2 hyperintense lesion along the right side of the cord at C3-4. 3. No new lesions. 4. Progressive moderate left foraminal narrowing at C3-4. 5. Moderate left and mild right foraminal narrowing at C4-5. 6. Moderate foraminal narrowing bilaterally at C6-7. Electronically Signed   By: Marin Roberts M.D.   On: 07/06/2021 11:30   DG Chest Portable 1 View  Result Date: 07/06/2021 CLINICAL DATA:  Weakness for 2-3 days EXAM: PORTABLE CHEST 1 VIEW COMPARISON:   04/29/2017 FINDINGS: Cardiac shadow is within normal limits. Lungs are well aerated bilaterally. No focal infiltrate or sizable effusion is seen. Minimal scarring in the left base is noted. No bony abnormality is seen. IMPRESSION: No acute abnormality noted. Electronically Signed   By: Alcide Clever M.D.   On: 07/06/2021 02:46    Catarina Hartshorn, DO  [facility]  If 7PM-7AM, please contact night-coverage [email or web address] Password [email or web address] 07/08/2021, 5:21 PM   LOS: 1 day

## 2021-07-08 NOTE — Progress Notes (Signed)
Patient refusing CPAP.

## 2021-07-09 LAB — CBC
HCT: 43.2 % (ref 39.0–52.0)
Hemoglobin: 14.3 g/dL (ref 13.0–17.0)
MCH: 30 pg (ref 26.0–34.0)
MCHC: 33.1 g/dL (ref 30.0–36.0)
MCV: 90.6 fL (ref 80.0–100.0)
Platelets: 203 10*3/uL (ref 150–400)
RBC: 4.77 MIL/uL (ref 4.22–5.81)
RDW: 13.1 % (ref 11.5–15.5)
WBC: 10.1 10*3/uL (ref 4.0–10.5)
nRBC: 0 % (ref 0.0–0.2)

## 2021-07-09 LAB — COMPREHENSIVE METABOLIC PANEL
ALT: 94 U/L — ABNORMAL HIGH (ref 0–44)
AST: 67 U/L — ABNORMAL HIGH (ref 15–41)
Albumin: 3.7 g/dL (ref 3.5–5.0)
Alkaline Phosphatase: 69 U/L (ref 38–126)
Anion gap: 7 (ref 5–15)
BUN: 24 mg/dL — ABNORMAL HIGH (ref 8–23)
CO2: 25 mmol/L (ref 22–32)
Calcium: 9.1 mg/dL (ref 8.9–10.3)
Chloride: 107 mmol/L (ref 98–111)
Creatinine, Ser: 0.84 mg/dL (ref 0.61–1.24)
GFR, Estimated: >60 mL/min (ref >60)
Glucose, Bld: 160 mg/dL — ABNORMAL HIGH (ref 70–99)
Potassium: 3.7 mmol/L (ref 3.5–5.1)
Sodium: 139 mmol/L (ref 135–145)
Total Bilirubin: 0.7 mg/dL (ref 0.3–1.2)
Total Protein: 6.7 g/dL (ref 6.5–8.1)

## 2021-07-09 LAB — D-DIMER, QUANTITATIVE: D-Dimer, Quant: 1.15 ug/mL-FEU — ABNORMAL HIGH (ref 0.00–0.50)

## 2021-07-09 LAB — FERRITIN: Ferritin: 676 ng/mL — ABNORMAL HIGH (ref 24–336)

## 2021-07-09 LAB — C-REACTIVE PROTEIN: CRP: 2.6 mg/dL — ABNORMAL HIGH (ref <1.0)

## 2021-07-09 MED ORDER — SODIUM CHLORIDE 0.9 % IV SOLN
1000.0000 mg | Freq: Once | INTRAVENOUS | Status: DC
  Filled 2021-07-09: qty 16

## 2021-07-09 MED ORDER — METHYLPREDNISOLONE SODIUM SUCC 1000 MG IJ SOLR
1000.0000 mg | INTRAMUSCULAR | Status: AC
  Administered 2021-07-09: 1000 mg via INTRAVENOUS
  Filled 2021-07-09: qty 16

## 2021-07-09 NOTE — Progress Notes (Signed)
Pt states he wants to refuse the last dose of Solumedrol tomorrow as it "makes him feel sick."  States he feels shaky and flush.  Explained to patient that it is a high dose and can cause these symptoms but he wants to refuse still.  Will pass along to nurse for tomorrow and to MD.

## 2021-07-09 NOTE — Progress Notes (Signed)
PROGRESS NOTE  Chad Vasquez JYN:[id number] DOB: Feb 24, 1961 DOA: 07/06/2021 PCP: Assunta Found, MD Brief History:  Chad Vasquez  is a 60 y.o. male, with history of chronic anxiety/depression, former tobacco use (1 pack/day since the age of 36, quit August 2022) COPD, coronary artery disease, GERD, hypertension, hyperlipidemia, multiple sclerosis, substance abuse, sarcoidosis, Schatzki's ring, and sleep apnea on CPAP presents to [facility] ED with a chief complaint of weakness.  He reports that he could not stand up, and that is why he called EMS.  Patient is wheelchair-bound, but usually able to stand on his left leg to transfer from wheelchair to recliner chair or to commode.  His left leg has been too weak to stand on.  Endorses intermittent headaches.  His home health aide called on 07/05/21 to tell them that she had to take the day off today because she was running a fever and needed to be checked for COVID.  Work-up in the ED revealed possible MS flare seen on MRI brain and MRI cervical spine.  COVID-19 screening test positive on 07/06/2021.   07/07/2021: Seen and examined at his bedside.  He is worried about not being on antiviral.  Paxlovid held due to acute transaminitis.   07/08/21--feeling better and able to move arms and legs again.  Expressed desire to go home if possible.  States he is "50-75% better"   Assessment/Plan: Generalized weakness, suspect possibly related to MS flare, seen on MRI brain and cervical spine. Started on IV Solu-Medrol Neurology has been consulted>>treat 3 days with solumedrol 1 gram x 3 days--last dose planned  on 10/7 PT OT recommended home health PT OT Fall precautions. -MR Brain/Cspine---Progression of periventricular and subcortical T2 hyperintensities bilaterally compatible with progression of multiple sclerosis. The largest lesion is in the anterior left corona radiata measuring 7 mm. No restricted diffusion or enhancement is present to suggest active  demyelination.   COVID-19 viral infection Mild cough, not hypoxic -personally reviewed chest x-ray done on admission no lobular infiltrates noted -continue Z-Pak for COPD exacerbation. Continue IV steroids due to possible MS flare Bronchodilators Antitussives Incentive spirometer/flutter valve Mobilize as tolerated -CRP 5.8>>1.2.6 -D-Dimer 1.75>>1.15 -Ferritin 642>>676 -discussed with neurology--wants to start antiviral concomitant to high dose steroids   Elevated liver chemistries, suspect in the setting of COVID-19 viral infection -AST ALT improving Avoid hepatotoxic agents Antivirals held to avoid worsening of LFTs. Continue to closely monitor LFTs.   Euthyroid sick syndrome TSH 0.144 Obtain free T4--1.03 -repeat TFTs in 1 months   COPD exacerbation in the setting of COVID-19 viral infection and former tobacco use. Management as stated above Quit smoking in August. -continue steroids   Chronic anxiety/depression Resume home Valium   Essential hypertension BP is currently at goal Resume home losartan   Obesity BMI 32 Recommend weight loss outpatient with regular physical activity and healthy dieting.   Generalized weakness PT OT assessed and recommended home health PT OT Fall precautions TOC consulted to assist with home health services for DC planning.       Status is: Inpatient   Remains inpatient appropriate because:Inpatient level of care appropriate due to severity of illness   Dispo:             Patient From: Home             Planned Disposition: Home with Health Care Svc             Medically stable for discharge:  No                           Family Communication:   no Family at bedside   Consultants:  none   Code Status:  FUL   DVT Prophylaxis:  [facility] [place] Heparin      Procedures: As Listed in Progress Note Above   Antibiotics: None    Subjective: Patient is feeling better.  He feels his strength is near his usual baseline.  Patient  denies fevers, chills, headache, chest pain, dyspnea, nausea, vomiting, diarrhea, abdominal pain, dysuria, hematuria, hematochezia, and melena.    Objective: Vitals:   07/08/21 0841 07/08/21 2009 07/09/21 0517 07/09/21 1357  BP: (!) 147/83 126/82 135/67 (!) 132/59  Pulse: 86 77 (!) 58 61  Resp:  19 18 16   Temp: 98.7 F (37.1 C) 97.9 F (36.6 C) 98.2 F (36.8 C) 98.2 F (36.8 C)  TempSrc:  Oral    SpO2: 97% 97% 95% 95%  Weight:      Height:        Intake/Output Summary (Last 24 hours) at 07/09/2021 1411 Last data filed at 07/09/2021 1300 Gross per 24 hour  Intake 795.38 ml  Output 1725 ml  Net -929.62 ml   Weight change:  Exam:  General:  Pt is alert, follows commands appropriately, not in acute distress HEENT: No icterus, No thrush, No neck mass, [place]/AT Cardiovascular: RRR, S1/S2, no rubs, no gallops Respiratory: fine bibasilar rales. No wheeze Abdomen: Soft/+BS, non tender, non distended, no guarding Extremities: trace LE edema, No lymphangitis, No petechiae, No rashes, no synovitis   Data Reviewed: I have personally reviewed following labs and imaging studies Basic Metabolic Panel: Recent Labs  Lab 07/06/21 0155 07/07/21 0720 07/08/21 0645 07/09/21 0604  NA 137 139 141 139  K 3.8 3.7 4.2 3.7  CL 104 103 108 107  CO2 26 25 24 25   GLUCOSE 118* 93 134* 160*  BUN 7* 10 20 24*  CREATININE 0.84 0.89 0.91 0.84  CALCIUM 8.9 9.3 9.3 9.1  MG  --   --  2.1  --   PHOS  --   --  3.0  --    Liver Function Tests: Recent Labs  Lab 07/06/21 0155 07/07/21 0720 07/08/21 0645 07/09/21 0604  AST 51* 107* 86* 67*  ALT 60* 162* 111* 94*  ALKPHOS 75 90 81 69  BILITOT 0.7 0.6 1.2 0.7  PROT 7.0 8.1 7.1 6.7  ALBUMIN 4.3 4.7 4.0 3.7   No results for input(s): LIPASE, AMYLASE in the last 168 hours. No results for input(s): AMMONIA in the last 168 hours. Coagulation Profile: Recent Labs  Lab 07/06/21 0155  INR 1.1   CBC: Recent Labs  Lab 07/06/21 0155  07/07/21 0720 07/08/21 0645 07/09/21 0604  WBC 6.4 8.9 10.2 10.1  NEUTROABS 5.2  --   --   --   HGB 14.1 15.6 15.0 14.3  HCT 41.2 46.7 44.5 43.2  MCV 91.4 94.2 90.4 90.6  PLT 168 180 193 203   Cardiac Enzymes: Recent Labs  Lab 07/06/21 0155  CKTOTAL 203   BNP: Invalid input(s): POCBNP CBG: Recent Labs  Lab 07/06/21 0154  GLUCAP 108*   HbA1C: Recent Labs    07/07/21 0657  HGBA1C 5.4   Urine analysis:    Component Value Date/Time   COLORURINE YELLOW 07/06/2021 1439   APPEARANCEUR CLEAR 07/06/2021 1439   LABSPEC 1.009 07/06/2021 1439   PHURINE 7.0 07/06/2021 1439  GLUCOSEU NEGATIVE 07/06/2021 1439   HGBUR NEGATIVE 07/06/2021 1439   BILIRUBINUR NEGATIVE 07/06/2021 1439   KETONESUR NEGATIVE 07/06/2021 1439   PROTEINUR NEGATIVE 07/06/2021 1439   UROBILINOGEN 0.2 04/15/2013 1108   NITRITE NEGATIVE 07/06/2021 1439   LEUKOCYTESUR NEGATIVE 07/06/2021 1439   Sepsis Labs: @LABRCNTIP (procalcitonin:4,lacticidven:4) ) Recent Results (from the past 240 hour(s))  Resp Panel by RT-PCR (Flu A&B, Covid) Nasopharyngeal Swab     Status: Abnormal   Collection Time: 07/06/21  2:25 AM   Specimen: Nasopharyngeal Swab; Nasopharyngeal(NP) swabs in vial transport medium  Result Value Ref Range Status   SARS Coronavirus 2 by RT PCR POSITIVE (A) NEGATIVE Final    Comment: RESULT CALLED TO, READ BACK BY AND VERIFIED WITH: SAPPELT,J @ 0333 ON 07/06/21 BY JUW (NOTE) SARS-CoV-2 target nucleic acids are DETECTED.  The SARS-CoV-2 RNA is generally detectable in upper respiratory specimens during the acute phase of infection. Positive results are indicative of the presence of the identified virus, but do not rule out bacterial infection or co-infection with other pathogens not detected by the test. Clinical correlation with patient history and other diagnostic information is necessary to determine patient infection status. The expected result is Negative.  Fact Sheet for  Patients: 09/05/21  Fact Sheet for Healthcare Providers: BloggerCourse.com  This test is not yet approved or cleared by the [employer] FDA and  has been authorized for detection and/or diagnosis of SARS-CoV-2 by FDA under an Emergency Use Authorization (EUA).  This EUA will remain in effect (meaning this test can b e used) for the duration of  the COVID-19 declaration under Section 564(b)(1) of the Act, 21 U.S.C. section 360bbb-3(b)(1), unless the authorization is terminated or revoked sooner.     Influenza A by PCR NEGATIVE NEGATIVE Final   Influenza B by PCR NEGATIVE NEGATIVE Final    Comment: (NOTE) The Xpert Xpress SARS-CoV-2/FLU/RSV plus assay is intended as an aid in the diagnosis of influenza from Nasopharyngeal swab specimens and should not be used as a sole basis for treatment. Nasal washings and aspirates are unacceptable for Xpert Xpress SARS-CoV-2/FLU/RSV testing.  Fact Sheet for Patients: [place]  Fact Sheet for Healthcare Providers: BloggerCourse.com  This test is not yet approved or cleared by the [employer] FDA and has been authorized for detection and/or diagnosis of SARS-CoV-2 by FDA under an Emergency Use Authorization (EUA). This EUA will remain in effect (meaning this test can be used) for the duration of the COVID-19 declaration under Section 564(b)(1) of the Act, 21 U.S.C. section 360bbb-3(b)(1), unless the authorization is terminated or revoked.  Performed at [facility], [address], [place], [place]      Scheduled Meds:   stroke: mapping our early stages of recovery book   Does not apply Once   vitamin C  500 mg Oral Daily   azithromycin  250 mg Oral Daily   baclofen  20 mg Oral TID   guaiFENesin  1,200 mg Oral BID   heparin  5,000 Units Subcutaneous Q8H   losartan  50 mg Oral q morning    nirmatrelvir/ritonavir EUA  3 tablet Oral BID   saccharomyces boulardii  250 mg Oral BID   vitamin B-12  1,500 mcg Oral Daily   Vitamin D (Ergocalciferol)  50,000 Units Oral Q7 days   zinc sulfate  220 mg Oral Daily   Continuous Infusions:  methylPREDNISolone (SOLU-MEDROL) injection Stopped (07/08/21 1938)    Procedures/Studies: CT Head Wo Contrast  Result Date: 07/06/2021 CLINICAL DATA:  Weakness  with blurred vision and headache. EXAM: CT HEAD WITHOUT CONTRAST TECHNIQUE: Contiguous axial images were obtained from the base of the skull through the vertex without intravenous contrast. COMPARISON:  November 09, 2017 FINDINGS: Brain: There is mild cerebral atrophy with widening of the extra-axial spaces and ventricular dilatation. There are areas of decreased attenuation within the white matter tracts of the supratentorial brain, consistent with microvascular disease changes. Vascular: No hyperdense vessel or unexpected calcification. Skull: Normal. Negative for fracture or focal lesion. Sinuses/Orbits: No acute finding. Other: None. IMPRESSION: 1. Generalized cerebral atrophy. 2. No acute intracranial abnormality. Electronically Signed   By: Aram Candela M.D.   On: 07/06/2021 02:12   MR Brain W and Wo Contrast  Result Date: 07/06/2021 CLINICAL DATA:  Demyelinating disease.  Weakness for 3 days. EXAM: MRI HEAD WITHOUT AND WITH CONTRAST MRI CERVICAL SPINE WITHOUT AND WITH CONTRAST TECHNIQUE: Multiplanar, multiecho pulse sequences of the brain and surrounding structures, and cervical spine, to include the craniocervical junction and cervicothoracic junction, were obtained without and with intravenous contrast. CONTRAST:  10mL GADAVIST GADOBUTROL 1 MMOL/ML IV SOLN COMPARISON:  MR head 04/09/2014 FINDINGS: MRI HEAD FINDINGS Brain: Periventricular T2 lesions have progressed since the prior exam. A peripherally T2 intense lesion in the anterior left corona radiata measures 7 mm, expanded from the prior  exam. Subcortical lesions in the left frontal operculum are similar the prior exam. Progressive T2 hyperintense areas noted about the atrium of the right lateral ventricle. No restricted diffusion or enhancement is present. No acute infarct or hemorrhage is present. The internal auditory canals are within normal limits. The brainstem and cerebellum are within normal limits. No pathologic enhancement is present. Vascular: Flow is present in the major intracranial arteries. Skull and upper cervical spine: The craniocervical junction is normal. Upper cervical spine is within normal limits. Marrow signal is unremarkable. Sinuses/Orbits: The paranasal sinuses and mastoid air cells are clear. The globes and orbits are within normal limits. Other: MRI CERVICAL SPINE FINDINGS Alignment: Physiologic. Vertebrae: Mild endplate degenerative changes are present at C3-4 C4-5. Marrow signal and vertebral body heights are otherwise normal. Cord: T2 hyperintense lesion along the right side of the cord at C3-4 is stable, measuring 15 mm cephalo caudad. Lesion on the left T2-3 is also stable. No new lesions are present. Posterior Fossa, vertebral arteries, paraspinal tissues: Craniocervical junction is normal. Flow is present in the vertebral arteries bilaterally. Visualized intracranial contents are normal. Disc levels: C2-3: Negative. C3-4: A broad-based disc osteophyte complex is present. Uncovertebral and facet spurring contribute to moderate right foraminal narrowing. Progressive moderate left foraminal narrowing is noted. Partial effacement of ventral CSF is present. C4-5: A leftward disc osteophyte complex is present. Uncovertebral spurring is present on the left. Moderate left and mild right foraminal narrowing is present. C5-6: Mild left foraminal narrowing is stable due to uncovertebral spurring. The central canal is patent. C6-7: Moderate foraminal narrowing bilaterally is stable due to uncovertebral disease. The central  canal is patent. C7-T1: Negative. IMPRESSION: 1. Progression of periventricular and subcortical T2 hyperintensities bilaterally compatible with progression of multiple sclerosis. The largest lesion is in the anterior left corona radiata measuring 7 mm. No restricted diffusion or enhancement is present to suggest active demyelination. 2. Stable appearance of T2 hyperintense lesion along the right side of the cord at C3-4. 3. No new lesions. 4. Progressive moderate left foraminal narrowing at C3-4. 5. Moderate left and mild right foraminal narrowing at C4-5. 6. Moderate foraminal narrowing bilaterally at C6-7. Electronically Signed  By: Marin Roberts M.D.   On: 07/06/2021 11:30   MR Cervical Spine W or Wo Contrast  Result Date: 07/06/2021 CLINICAL DATA:  Demyelinating disease.  Weakness for 3 days. EXAM: MRI HEAD WITHOUT AND WITH CONTRAST MRI CERVICAL SPINE WITHOUT AND WITH CONTRAST TECHNIQUE: Multiplanar, multiecho pulse sequences of the brain and surrounding structures, and cervical spine, to include the craniocervical junction and cervicothoracic junction, were obtained without and with intravenous contrast. CONTRAST:  61mL GADAVIST GADOBUTROL 1 MMOL/ML IV SOLN COMPARISON:  MR head 04/09/2014 FINDINGS: MRI HEAD FINDINGS Brain: Periventricular T2 lesions have progressed since the prior exam. A peripherally T2 intense lesion in the anterior left corona radiata measures 7 mm, expanded from the prior exam. Subcortical lesions in the left frontal operculum are similar the prior exam. Progressive T2 hyperintense areas noted about the atrium of the right lateral ventricle. No restricted diffusion or enhancement is present. No acute infarct or hemorrhage is present. The internal auditory canals are within normal limits. The brainstem and cerebellum are within normal limits. No pathologic enhancement is present. Vascular: Flow is present in the major intracranial arteries. Skull and upper cervical spine: The  craniocervical junction is normal. Upper cervical spine is within normal limits. Marrow signal is unremarkable. Sinuses/Orbits: The paranasal sinuses and mastoid air cells are clear. The globes and orbits are within normal limits. Other: MRI CERVICAL SPINE FINDINGS Alignment: Physiologic. Vertebrae: Mild endplate degenerative changes are present at C3-4 C4-5. Marrow signal and vertebral body heights are otherwise normal. Cord: T2 hyperintense lesion along the right side of the cord at C3-4 is stable, measuring 15 mm cephalo caudad. Lesion on the left T2-3 is also stable. No new lesions are present. Posterior Fossa, vertebral arteries, paraspinal tissues: Craniocervical junction is normal. Flow is present in the vertebral arteries bilaterally. Visualized intracranial contents are normal. Disc levels: C2-3: Negative. C3-4: A broad-based disc osteophyte complex is present. Uncovertebral and facet spurring contribute to moderate right foraminal narrowing. Progressive moderate left foraminal narrowing is noted. Partial effacement of ventral CSF is present. C4-5: A leftward disc osteophyte complex is present. Uncovertebral spurring is present on the left. Moderate left and mild right foraminal narrowing is present. C5-6: Mild left foraminal narrowing is stable due to uncovertebral spurring. The central canal is patent. C6-7: Moderate foraminal narrowing bilaterally is stable due to uncovertebral disease. The central canal is patent. C7-T1: Negative. IMPRESSION: 1. Progression of periventricular and subcortical T2 hyperintensities bilaterally compatible with progression of multiple sclerosis. The largest lesion is in the anterior left corona radiata measuring 7 mm. No restricted diffusion or enhancement is present to suggest active demyelination. 2. Stable appearance of T2 hyperintense lesion along the right side of the cord at C3-4. 3. No new lesions. 4. Progressive moderate left foraminal narrowing at C3-4. 5. Moderate  left and mild right foraminal narrowing at C4-5. 6. Moderate foraminal narrowing bilaterally at C6-7. Electronically Signed   By: Marin Roberts M.D.   On: 07/06/2021 11:30   DG Chest Portable 1 View  Result Date: 07/06/2021 CLINICAL DATA:  Weakness for 2-3 days EXAM: PORTABLE CHEST 1 VIEW COMPARISON:  04/29/2017 FINDINGS: Cardiac shadow is within normal limits. Lungs are well aerated bilaterally. No focal infiltrate or sizable effusion is seen. Minimal scarring in the left base is noted. No bony abnormality is seen. IMPRESSION: No acute abnormality noted. Electronically Signed   By: Alcide Clever M.D.   On: 07/06/2021 02:46    Catarina Hartshorn, DO  [facility]  If 7PM-7AM, please contact  night-coverage [email or web address] Password [email or web address] 07/09/2021, 2:11 PM   LOS: 2 days

## 2021-07-09 NOTE — Progress Notes (Signed)
Physical Therapy Treatment Patient Details Name: Chad Vasquez MRN: [id number] DOB: 13-Jan-1961 Today's Date: 07/09/2021   History of Present Illness Chad Vasquez  is a 60 y.o. male, with history of anxiety and depression, COPD, coronary artery disease, GERD, hypertension, hyperlipidemia, multiple sclerosis, substance abuse, sarcoidosis, Schatzki's ring, and sleep apnea on CPAP presents the ED with a chief complaint of weakness.  Patient is a poor historian and is difficult to discern baseline.  He reports he has had a headache, weakness, and pain in his legs.  He has had episodes like this in the past.  This episode started yesterday.  Been progressively worse since it started.  He reports that he could not stand up, and that is why he called EMS.  Patient is wheelchair-bound, but usually able to stand on his left leg to transfer from wheelchair to recliner chair or to commode.  His left leg has been too weak to stand on.  Its not related to the pain, as the pain is chronic for him.  He reports that he has burning and stabbing pain in the bilateral legs.  Its been there for years.  Right now is a little better than it was before he left the house.  Patient reports that his headache is also like his normal headache.  Patient also reports gradually worsening right hand weakness.  He reports that this has been going on for a few months.  He has had no cough, no fevers.  He does report minimal dyspnea the morning of presentation.  He had no loss of taste or smell.  He reports that his home health aide called them yesterday to tell them that she had to take the day off today because she was running a fever and needed to be checked for COVID.    PT Comments    Patient demonstrates good return for sitting up at bedside using bed rail with HOB raised, increased BUE strength for completing partial stand pivot transfer over to chair using bed rail and armrest of chair without loss of balance.  Patient required  active assistance for completing most BLE exercises, unable to lift RLE against gravity due to baseline weakness.  Patient tolerated sitting up in chair after therapy - nursing staff notified.  Patient will benefit from continued physical therapy in hospital and recommended venue below to increase strength, balance, endurance for safe ADLs and gait.     Recommendations for follow up therapy are one component of a multi-disciplinary discharge planning process, led by the attending physician.  Recommendations may be updated based on patient status, additional functional criteria and insurance authorization.  Follow Up Recommendations  Home health PT;Supervision for mobility/OOB;Supervision - Intermittent     Equipment Recommendations  None recommended by PT    Recommendations for Other Services       Precautions / Restrictions Precautions Precautions: Fall Restrictions Weight Bearing Restrictions: No     Mobility  Bed Mobility         Supine to sit: Supervision     General bed mobility comments: increased time, had to use bed rail with HOB raised    Transfers Overall transfer level: Needs assistance   Transfers: Lateral/Scoot Transfers;Stand Pivot Transfers   Stand pivot transfers: Supervision      Lateral/Scoot Transfers: Supervision General transfer comment: demonstrates good return for scooting over and partial stand pivoting to chair using mostly BUE strength to support self  Ambulation/Gait  Stairs             Wheelchair Mobility    Modified Rankin (Stroke Patients Only)       Balance Overall balance assessment: Needs assistance Sitting-balance support: Feet supported;No upper extremity supported Sitting balance-Leahy Scale: Good Sitting balance - Comments: sitting at EOB   Standing balance support: During functional activity;Bilateral upper extremity supported Standing balance-Leahy Scale: Poor Standing balance comment:  using bed rails and armrest of chair for partial standing                            Cognition Arousal/Alertness: Awake/alert Behavior During Therapy: WFL for tasks assessed/performed Overall Cognitive Status: Within Functional Limits for tasks assessed                                        Exercises General Exercises - Lower Extremity Long Arc Quad: Seated;AROM;AAROM;Left;10 reps Hip ABduction/ADduction: Seated;AROM;Strengthening;Both;10 reps Hip Flexion/Marching: Seated;AAROM;Strengthening;Left;10 reps Toe Raises: Seated;AROM;Strengthening;Left;15 reps    General Comments        Pertinent Vitals/Pain Pain Assessment: No/denies pain    Home Living                      Prior Function            PT Goals (current goals can now be found in the care plan section) Acute Rehab PT Goals Patient Stated Goal: return home PT Goal Formulation: With patient Time For Goal Achievement: 07/14/21 Potential to Achieve Goals: Good Progress towards PT goals: Progressing toward goals    Frequency    Min 3X/week      PT Plan Current plan remains appropriate    Co-evaluation              AM-PAC PT "6 Clicks" Mobility   Outcome Measure  Help needed turning from your back to your side while in a flat bed without using bedrails?: None Help needed moving from lying on your back to sitting on the side of a flat bed without using bedrails?: A Little Help needed moving to and from a bed to a chair (including a wheelchair)?: A Little Help needed standing up from a chair using your arms (e.g., wheelchair or bedside chair)?: A Lot Help needed to walk in hospital room?: Total Help needed climbing 3-5 steps with a railing? : Total 6 Click Score: 14    End of Session   Activity Tolerance: Patient tolerated treatment well;Patient limited by fatigue Patient left: in chair;with call bell/phone within reach Nurse Communication: Mobility status PT  Visit Diagnosis: Unsteadiness on feet (R26.81);Other abnormalities of gait and mobility (R26.89);Muscle weakness (generalized) (M62.81)     Time: 3875-6433 PT Time Calculation (min) (ACUTE ONLY): 20 min  Charges:  $Therapeutic Exercise: 8-22 mins $Therapeutic Activity: 8-22 mins                     12:36 PM, 07/09/21 Ocie Bob, MPT Physical Therapist with [facility] [phone] office [phone] mobile phone

## 2021-07-09 NOTE — Progress Notes (Addendum)
Patient is agreeable to [facility]. Discussed options of HH providers. Linda with [facility] is agreeable to accept patient for RN and PT.    Saryah Loper, Juleen China, LCSW

## 2021-07-10 LAB — CBC
HCT: 42.3 % (ref 39.0–52.0)
Hemoglobin: 14 g/dL (ref 13.0–17.0)
MCH: 29.9 pg (ref 26.0–34.0)
MCHC: 33.1 g/dL (ref 30.0–36.0)
MCV: 90.2 fL (ref 80.0–100.0)
Platelets: 205 10*3/uL (ref 150–400)
RBC: 4.69 MIL/uL (ref 4.22–5.81)
RDW: 13.2 % (ref 11.5–15.5)
WBC: 12.9 10*3/uL — ABNORMAL HIGH (ref 4.0–10.5)
nRBC: 0 % (ref 0.0–0.2)

## 2021-07-10 LAB — COMPREHENSIVE METABOLIC PANEL
ALT: 99 U/L — ABNORMAL HIGH (ref 0–44)
AST: 58 U/L — ABNORMAL HIGH (ref 15–41)
Albumin: 3.7 g/dL (ref 3.5–5.0)
Alkaline Phosphatase: 64 U/L (ref 38–126)
Anion gap: 9 (ref 5–15)
BUN: 26 mg/dL — ABNORMAL HIGH (ref 8–23)
CO2: 23 mmol/L (ref 22–32)
Calcium: 9.1 mg/dL (ref 8.9–10.3)
Chloride: 109 mmol/L (ref 98–111)
Creatinine, Ser: 0.76 mg/dL (ref 0.61–1.24)
GFR, Estimated: >60 mL/min (ref >60)
Glucose, Bld: 154 mg/dL — ABNORMAL HIGH (ref 70–99)
Potassium: 3.7 mmol/L (ref 3.5–5.1)
Sodium: 141 mmol/L (ref 135–145)
Total Bilirubin: 0.8 mg/dL (ref 0.3–1.2)
Total Protein: 6.7 g/dL (ref 6.5–8.1)

## 2021-07-10 LAB — D-DIMER, QUANTITATIVE: D-Dimer, Quant: 0.98 ug/mL-FEU — ABNORMAL HIGH (ref 0.00–0.50)

## 2021-07-10 LAB — C-REACTIVE PROTEIN: CRP: 0.9 mg/dL (ref <1.0)

## 2021-07-10 MED ORDER — NIRMATRELVIR/RITONAVIR (PAXLOVID)TABLET: 3.0000 | ORAL_TABLET | Freq: Two times a day (BID) | ORAL | 0 refills | Status: AC

## 2021-07-10 MED ORDER — METHYLPREDNISOLONE SODIUM SUCC 125 MG IJ SOLR
80.0000 mg | Freq: Once | INTRAMUSCULAR | Status: AC
  Administered 2021-07-10: 80 mg via INTRAVENOUS
  Filled 2021-07-10: qty 2

## 2021-07-10 NOTE — Plan of Care (Signed)

## 2021-07-10 NOTE — Discharge Summary (Signed)
Physician Discharge Summary  Chad Vasquez EUM:[id number] DOB: 03/08/1961 DOA: 07/06/2021  PCP: Assunta Found, MD  Admit date: 07/06/2021 Discharge date: 07/10/2021  Admitted From: Home Disposition:  Home   Recommendations for Outpatient Follow-up:  Follow up with PCP in 1-2 weeks Please obtain LFTs/BMP/CBC in one week   Home Health:HHPT   Discharge Condition: Stable CODE STATUS: FULL Diet recommendation: Heart Healthy   Brief/Interim Summary: 60 y.o. male, with history of chronic anxiety/depression, former tobacco use (1 pack/day since the age of 11, quit August 2022) COPD, coronary artery disease, GERD, hypertension, hyperlipidemia, multiple sclerosis, substance abuse, sarcoidosis, Schatzki's ring, and sleep apnea on CPAP presents to [facility] ED with a chief complaint of weakness.  He reports that he could not stand up, and that is why he called EMS.  Patient is wheelchair-bound, but usually able to stand on his left leg to transfer from wheelchair to recliner chair or to commode.  His left leg has been too weak to stand on.  Endorses intermittent headaches.  His home health aide called on 07/05/21 to tell them that she had to take the day off today because she was running a fever and needed to be checked for COVID.  Work-up in the ED revealed possible MS flare seen on MRI brain and MRI cervical spine.  COVID-19 screening test positive on 07/06/2021.   07/07/2021: Seen and examined at his bedside.  He is worried about not being on antiviral.  Paxlovid held due to acute transaminitis.   07/08/21--feeling better and able to move arms and legs again.  Expressed desire to go home if possible.  States he is "50-75% better"  07/10/21: patient states he continues to feel stronger.  He feels like he is near his baseline regarding strength.  Denies f/c, cp, sob, n/v/d.  Patient refuses third dose of solumedrol 1 gram even after discussing the risks/benefits/alternatives  Discharge Diagnoses:    Generalized weakness, suspect possibly related to MS flare, seen on MRI brain and cervical spine. Started on IV Solu-Medrol 80 mg bid initially for COPD exacerbation Neurology has been consulted>>treat 3 days with solumedrol 1 gram x 3 days--last dose planned @1500  on 10/7 -patient refused last dose of solumedrol 1 gram (he received 2 of 3 doses) PT OT recommended home health PT OT Fall precautions. -MR Brain/Cspine---Progression of periventricular and subcortical T2 hyperintensities bilaterally compatible with progression of multiple sclerosis. The largest lesion is in the anterior left corona radiata measuring 7 mm. No restricted diffusion or enhancement is present to suggest active demyelination. -on day of d/c patient felt hat his strength was near his baseline   COVID-19 viral infection Mild cough, not hypoxic -personally reviewed chest x-ray done on admission no lobular infiltrates noted -continued Z-Pak for COPD exacerbation. Continue IV steroids due to possible MS flare Bronchodilators Antitussives Incentive spirometer/flutter valve Mobilize as tolerated -CRP 5.8>>1.2.6 -D-Dimer 1.75>>1.15 -Ferritin 642>>676 -discussed with neurology--wants to start antiviral concomitant to high dose steroids -finish 6 more doses Paxlovid after d/c   Elevated liver chemistries, suspect in the setting of COVID-19 viral infection Avoid hepatotoxic agents LFTs slowly improving daily -repeat LFTs in one week after d/c   Euthyroid sick syndrome TSH 0.144 Obtain free T4--1.03 -repeat TFTs in 1 months   COPD exacerbation in the setting of COVID-19 viral infection and former tobacco use. Management as stated above Quit smoking in August. -continue steroids   Chronic anxiety/depression Resume home Valium   Essential hypertension BP is currently at goal Resume home losartan  Obesity BMI 32 Recommend weight loss outpatient with regular physical activity and healthy dieting.    Generalized weakness PT OT assessed and recommended home health PT OT Fall precautions TOC consulted to assist with home health services for DC planning.    Discharge Instructions   Allergies as of 07/10/2021   No Known Allergies      Medication List     STOP taking these medications    olmesartan 40 MG tablet Commonly known as: BENICAR       TAKE these medications    baclofen 20 MG tablet Commonly known as: LIORESAL Take 1 tablet (20 mg total) by mouth 3 (three) times daily.   Biotin 89211 MCG Tabs Take 50,000 mcg by mouth daily.   buPROPion 150 MG 24 hr tablet Commonly known as: WELLBUTRIN XL Take 150 mg by mouth daily.   calcium carbonate 500 MG chewable tablet Commonly known as: TUMS - dosed in mg elemental calcium Chew 1 tablet by mouth daily as needed for indigestion or heartburn. Reported on 02/03/2016   cyanocobalamin 1000 MCG/ML injection Commonly known as: (VITAMIN B-12) Inject 1,000 mcg into the muscle every 30 (thirty) days.   diazepam 10 MG tablet Commonly known as: VALIUM Take 10 mg by mouth 4 (four) times daily.   docusate sodium 100 MG capsule Commonly known as: COLACE Take 200 mg by mouth 2 (two) times daily as needed for mild constipation. Reported on 02/03/2016   losartan 100 MG tablet Commonly known as: COZAAR Take 50 mg by mouth every morning.   nirmatrelvir/ritonavir EUA 20 x 150 MG & 10 x 100MG  Tabs Commonly known as: PAXLOVID Take 3 tablets by mouth 2 (two) times daily for 5 days. Patient GFR is >60 Take nirmatrelvir (150 mg) two tablets twice daily for 5 days and ritonavir (100 mg) one tablet twice daily for 6 more doses   ProAir HFA 108 (90 Base) MCG/ACT inhaler Generic drug: albuterol INHALE TWO PUFFS INTO THE LUNGS EVERY SIX HOURS AS NEEDED FOR WHEEZING   PROBIOTIC DAILY PO Take 1 capsule by mouth daily.   traMADol 50 MG tablet Commonly known as: ULTRAM Take 100 mg by mouth every 6 (six) hours as needed for moderate  pain.   Vitamin D (Ergocalciferol) 1.25 MG (50000 UNIT) Caps capsule Commonly known as: DRISDOL Take 50,000 Units by mouth every 7 (seven) days. Takes on Wedneday        No Known Allergies  Consultations: none   Procedures/Studies: CT Head Wo Contrast  Result Date: 07/06/2021 CLINICAL DATA:  Weakness with blurred vision and headache. EXAM: CT HEAD WITHOUT CONTRAST TECHNIQUE: Contiguous axial images were obtained from the base of the skull through the vertex without intravenous contrast. COMPARISON:  November 09, 2017 FINDINGS: Brain: There is mild cerebral atrophy with widening of the extra-axial spaces and ventricular dilatation. There are areas of decreased attenuation within the white matter tracts of the supratentorial brain, consistent with microvascular disease changes. Vascular: No hyperdense vessel or unexpected calcification. Skull: Normal. Negative for fracture or focal lesion. Sinuses/Orbits: No acute finding. Other: None. IMPRESSION: 1. Generalized cerebral atrophy. 2. No acute intracranial abnormality. Electronically Signed   By: November 11, 2017 M.D.   On: 07/06/2021 02:12   MR Brain W and Wo Contrast  Result Date: 07/06/2021 CLINICAL DATA:  Demyelinating disease.  Weakness for 3 days. EXAM: MRI HEAD WITHOUT AND WITH CONTRAST MRI CERVICAL SPINE WITHOUT AND WITH CONTRAST TECHNIQUE: Multiplanar, multiecho pulse sequences of the brain and surrounding structures, and cervical spine,  to include the craniocervical junction and cervicothoracic junction, were obtained without and with intravenous contrast. CONTRAST:  10mL GADAVIST GADOBUTROL 1 MMOL/ML IV SOLN COMPARISON:  MR head 04/09/2014 FINDINGS: MRI HEAD FINDINGS Brain: Periventricular T2 lesions have progressed since the prior exam. A peripherally T2 intense lesion in the anterior left corona radiata measures 7 mm, expanded from the prior exam. Subcortical lesions in the left frontal operculum are similar the prior exam.  Progressive T2 hyperintense areas noted about the atrium of the right lateral ventricle. No restricted diffusion or enhancement is present. No acute infarct or hemorrhage is present. The internal auditory canals are within normal limits. The brainstem and cerebellum are within normal limits. No pathologic enhancement is present. Vascular: Flow is present in the major intracranial arteries. Skull and upper cervical spine: The craniocervical junction is normal. Upper cervical spine is within normal limits. Marrow signal is unremarkable. Sinuses/Orbits: The paranasal sinuses and mastoid air cells are clear. The globes and orbits are within normal limits. Other: MRI CERVICAL SPINE FINDINGS Alignment: Physiologic. Vertebrae: Mild endplate degenerative changes are present at C3-4 C4-5. Marrow signal and vertebral body heights are otherwise normal. Cord: T2 hyperintense lesion along the right side of the cord at C3-4 is stable, measuring 15 mm cephalo caudad. Lesion on the left T2-3 is also stable. No new lesions are present. Posterior Fossa, vertebral arteries, paraspinal tissues: Craniocervical junction is normal. Flow is present in the vertebral arteries bilaterally. Visualized intracranial contents are normal. Disc levels: C2-3: Negative. C3-4: A broad-based disc osteophyte complex is present. Uncovertebral and facet spurring contribute to moderate right foraminal narrowing. Progressive moderate left foraminal narrowing is noted. Partial effacement of ventral CSF is present. C4-5: A leftward disc osteophyte complex is present. Uncovertebral spurring is present on the left. Moderate left and mild right foraminal narrowing is present. C5-6: Mild left foraminal narrowing is stable due to uncovertebral spurring. The central canal is patent. C6-7: Moderate foraminal narrowing bilaterally is stable due to uncovertebral disease. The central canal is patent. C7-T1: Negative. IMPRESSION: 1. Progression of periventricular and  subcortical T2 hyperintensities bilaterally compatible with progression of multiple sclerosis. The largest lesion is in the anterior left corona radiata measuring 7 mm. No restricted diffusion or enhancement is present to suggest active demyelination. 2. Stable appearance of T2 hyperintense lesion along the right side of the cord at C3-4. 3. No new lesions. 4. Progressive moderate left foraminal narrowing at C3-4. 5. Moderate left and mild right foraminal narrowing at C4-5. 6. Moderate foraminal narrowing bilaterally at C6-7. Electronically Signed   By: Marin Roberts M.D.   On: 07/06/2021 11:30   MR Cervical Spine W or Wo Contrast  Result Date: 07/06/2021 CLINICAL DATA:  Demyelinating disease.  Weakness for 3 days. EXAM: MRI HEAD WITHOUT AND WITH CONTRAST MRI CERVICAL SPINE WITHOUT AND WITH CONTRAST TECHNIQUE: Multiplanar, multiecho pulse sequences of the brain and surrounding structures, and cervical spine, to include the craniocervical junction and cervicothoracic junction, were obtained without and with intravenous contrast. CONTRAST:  10mL GADAVIST GADOBUTROL 1 MMOL/ML IV SOLN COMPARISON:  MR head 04/09/2014 FINDINGS: MRI HEAD FINDINGS Brain: Periventricular T2 lesions have progressed since the prior exam. A peripherally T2 intense lesion in the anterior left corona radiata measures 7 mm, expanded from the prior exam. Subcortical lesions in the left frontal operculum are similar the prior exam. Progressive T2 hyperintense areas noted about the atrium of the right lateral ventricle. No restricted diffusion or enhancement is present. No acute infarct or hemorrhage is present. The  internal auditory canals are within normal limits. The brainstem and cerebellum are within normal limits. No pathologic enhancement is present. Vascular: Flow is present in the major intracranial arteries. Skull and upper cervical spine: The craniocervical junction is normal. Upper cervical spine is within normal limits. Marrow  signal is unremarkable. Sinuses/Orbits: The paranasal sinuses and mastoid air cells are clear. The globes and orbits are within normal limits. Other: MRI CERVICAL SPINE FINDINGS Alignment: Physiologic. Vertebrae: Mild endplate degenerative changes are present at C3-4 C4-5. Marrow signal and vertebral body heights are otherwise normal. Cord: T2 hyperintense lesion along the right side of the cord at C3-4 is stable, measuring 15 mm cephalo caudad. Lesion on the left T2-3 is also stable. No new lesions are present. Posterior Fossa, vertebral arteries, paraspinal tissues: Craniocervical junction is normal. Flow is present in the vertebral arteries bilaterally. Visualized intracranial contents are normal. Disc levels: C2-3: Negative. C3-4: A broad-based disc osteophyte complex is present. Uncovertebral and facet spurring contribute to moderate right foraminal narrowing. Progressive moderate left foraminal narrowing is noted. Partial effacement of ventral CSF is present. C4-5: A leftward disc osteophyte complex is present. Uncovertebral spurring is present on the left. Moderate left and mild right foraminal narrowing is present. C5-6: Mild left foraminal narrowing is stable due to uncovertebral spurring. The central canal is patent. C6-7: Moderate foraminal narrowing bilaterally is stable due to uncovertebral disease. The central canal is patent. C7-T1: Negative. IMPRESSION: 1. Progression of periventricular and subcortical T2 hyperintensities bilaterally compatible with progression of multiple sclerosis. The largest lesion is in the anterior left corona radiata measuring 7 mm. No restricted diffusion or enhancement is present to suggest active demyelination. 2. Stable appearance of T2 hyperintense lesion along the right side of the cord at C3-4. 3. No new lesions. 4. Progressive moderate left foraminal narrowing at C3-4. 5. Moderate left and mild right foraminal narrowing at C4-5. 6. Moderate foraminal narrowing  bilaterally at C6-7. Electronically Signed   By: Marin Roberts M.D.   On: 07/06/2021 11:30   DG Chest Portable 1 View  Result Date: 07/06/2021 CLINICAL DATA:  Weakness for 2-3 days EXAM: PORTABLE CHEST 1 VIEW COMPARISON:  04/29/2017 FINDINGS: Cardiac shadow is within normal limits. Lungs are well aerated bilaterally. No focal infiltrate or sizable effusion is seen. Minimal scarring in the left base is noted. No bony abnormality is seen. IMPRESSION: No acute abnormality noted. Electronically Signed   By: Alcide Clever M.D.   On: 07/06/2021 02:46        Discharge Exam: Vitals:   07/09/21 2044 07/10/21 0413  BP: (!) 155/77 (!) 155/69  Pulse: 61 (!) 59  Resp: 18 18  Temp: 97.7 F (36.5 C) 98.1 F (36.7 C)  SpO2: 96% 96%   Vitals:   07/09/21 0517 07/09/21 1357 07/09/21 2044 07/10/21 0413  BP: 135/67 (!) 132/59 (!) 155/77 (!) 155/69  Pulse: (!) 58 61 61 (!) 59  Resp: Temp: 98.2 F (36.8 C) 98.2 F (36.8 C) 97.7 F (36.5 C) 98.1 F (36.7 C)  TempSrc:   Oral   SpO2: 95% 95% 96% 96%  Weight:      Height:        General: Pt is alert, awake, not in acute distress Cardiovascular: RRR, S1/S2 +, no rubs, no gallops Respiratory: bibasilar rales. No wheeze Abdominal: Soft, NT, ND, bowel sounds + Extremities: no edema, no cyanosis   The results of significant diagnostics from this hospitalization (including imaging, microbiology, ancillary and laboratory) are  listed below for reference.    Significant Diagnostic Studies: CT Head Wo Contrast  Result Date: 07/06/2021 CLINICAL DATA:  Weakness with blurred vision and headache. EXAM: CT HEAD WITHOUT CONTRAST TECHNIQUE: Contiguous axial images were obtained from the base of the skull through the vertex without intravenous contrast. COMPARISON:  November 09, 2017 FINDINGS: Brain: There is mild cerebral atrophy with widening of the extra-axial spaces and ventricular dilatation. There are areas of decreased attenuation within  the white matter tracts of the supratentorial brain, consistent with microvascular disease changes. Vascular: No hyperdense vessel or unexpected calcification. Skull: Normal. Negative for fracture or focal lesion. Sinuses/Orbits: No acute finding. Other: None. IMPRESSION: 1. Generalized cerebral atrophy. 2. No acute intracranial abnormality. Electronically Signed   By: Aram Candela M.D.   On: 07/06/2021 02:12   MR Brain W and Wo Contrast  Result Date: 07/06/2021 CLINICAL DATA:  Demyelinating disease.  Weakness for 3 days. EXAM: MRI HEAD WITHOUT AND WITH CONTRAST MRI CERVICAL SPINE WITHOUT AND WITH CONTRAST TECHNIQUE: Multiplanar, multiecho pulse sequences of the brain and surrounding structures, and cervical spine, to include the craniocervical junction and cervicothoracic junction, were obtained without and with intravenous contrast. CONTRAST:  60mL GADAVIST GADOBUTROL 1 MMOL/ML IV SOLN COMPARISON:  MR head 04/09/2014 FINDINGS: MRI HEAD FINDINGS Brain: Periventricular T2 lesions have progressed since the prior exam. A peripherally T2 intense lesion in the anterior left corona radiata measures 7 mm, expanded from the prior exam. Subcortical lesions in the left frontal operculum are similar the prior exam. Progressive T2 hyperintense areas noted about the atrium of the right lateral ventricle. No restricted diffusion or enhancement is present. No acute infarct or hemorrhage is present. The internal auditory canals are within normal limits. The brainstem and cerebellum are within normal limits. No pathologic enhancement is present. Vascular: Flow is present in the major intracranial arteries. Skull and upper cervical spine: The craniocervical junction is normal. Upper cervical spine is within normal limits. Marrow signal is unremarkable. Sinuses/Orbits: The paranasal sinuses and mastoid air cells are clear. The globes and orbits are within normal limits. Other: MRI CERVICAL SPINE FINDINGS Alignment:  Physiologic. Vertebrae: Mild endplate degenerative changes are present at C3-4 C4-5. Marrow signal and vertebral body heights are otherwise normal. Cord: T2 hyperintense lesion along the right side of the cord at C3-4 is stable, measuring 15 mm cephalo caudad. Lesion on the left T2-3 is also stable. No new lesions are present. Posterior Fossa, vertebral arteries, paraspinal tissues: Craniocervical junction is normal. Flow is present in the vertebral arteries bilaterally. Visualized intracranial contents are normal. Disc levels: C2-3: Negative. C3-4: A broad-based disc osteophyte complex is present. Uncovertebral and facet spurring contribute to moderate right foraminal narrowing. Progressive moderate left foraminal narrowing is noted. Partial effacement of ventral CSF is present. C4-5: A leftward disc osteophyte complex is present. Uncovertebral spurring is present on the left. Moderate left and mild right foraminal narrowing is present. C5-6: Mild left foraminal narrowing is stable due to uncovertebral spurring. The central canal is patent. C6-7: Moderate foraminal narrowing bilaterally is stable due to uncovertebral disease. The central canal is patent. C7-T1: Negative. IMPRESSION: 1. Progression of periventricular and subcortical T2 hyperintensities bilaterally compatible with progression of multiple sclerosis. The largest lesion is in the anterior left corona radiata measuring 7 mm. No restricted diffusion or enhancement is present to suggest active demyelination. 2. Stable appearance of T2 hyperintense lesion along the right side of the cord at C3-4. 3. No new lesions. 4. Progressive moderate left foraminal narrowing  at C3-4. 5. Moderate left and mild right foraminal narrowing at C4-5. 6. Moderate foraminal narrowing bilaterally at C6-7. Electronically Signed   By: Marin Roberts M.D.   On: 07/06/2021 11:30   MR Cervical Spine W or Wo Contrast  Result Date: 07/06/2021 CLINICAL DATA:  Demyelinating  disease.  Weakness for 3 days. EXAM: MRI HEAD WITHOUT AND WITH CONTRAST MRI CERVICAL SPINE WITHOUT AND WITH CONTRAST TECHNIQUE: Multiplanar, multiecho pulse sequences of the brain and surrounding structures, and cervical spine, to include the craniocervical junction and cervicothoracic junction, were obtained without and with intravenous contrast. CONTRAST:  31mL GADAVIST GADOBUTROL 1 MMOL/ML IV SOLN COMPARISON:  MR head 04/09/2014 FINDINGS: MRI HEAD FINDINGS Brain: Periventricular T2 lesions have progressed since the prior exam. A peripherally T2 intense lesion in the anterior left corona radiata measures 7 mm, expanded from the prior exam. Subcortical lesions in the left frontal operculum are similar the prior exam. Progressive T2 hyperintense areas noted about the atrium of the right lateral ventricle. No restricted diffusion or enhancement is present. No acute infarct or hemorrhage is present. The internal auditory canals are within normal limits. The brainstem and cerebellum are within normal limits. No pathologic enhancement is present. Vascular: Flow is present in the major intracranial arteries. Skull and upper cervical spine: The craniocervical junction is normal. Upper cervical spine is within normal limits. Marrow signal is unremarkable. Sinuses/Orbits: The paranasal sinuses and mastoid air cells are clear. The globes and orbits are within normal limits. Other: MRI CERVICAL SPINE FINDINGS Alignment: Physiologic. Vertebrae: Mild endplate degenerative changes are present at C3-4 C4-5. Marrow signal and vertebral body heights are otherwise normal. Cord: T2 hyperintense lesion along the right side of the cord at C3-4 is stable, measuring 15 mm cephalo caudad. Lesion on the left T2-3 is also stable. No new lesions are present. Posterior Fossa, vertebral arteries, paraspinal tissues: Craniocervical junction is normal. Flow is present in the vertebral arteries bilaterally. Visualized intracranial contents are  normal. Disc levels: C2-3: Negative. C3-4: A broad-based disc osteophyte complex is present. Uncovertebral and facet spurring contribute to moderate right foraminal narrowing. Progressive moderate left foraminal narrowing is noted. Partial effacement of ventral CSF is present. C4-5: A leftward disc osteophyte complex is present. Uncovertebral spurring is present on the left. Moderate left and mild right foraminal narrowing is present. C5-6: Mild left foraminal narrowing is stable due to uncovertebral spurring. The central canal is patent. C6-7: Moderate foraminal narrowing bilaterally is stable due to uncovertebral disease. The central canal is patent. C7-T1: Negative. IMPRESSION: 1. Progression of periventricular and subcortical T2 hyperintensities bilaterally compatible with progression of multiple sclerosis. The largest lesion is in the anterior left corona radiata measuring 7 mm. No restricted diffusion or enhancement is present to suggest active demyelination. 2. Stable appearance of T2 hyperintense lesion along the right side of the cord at C3-4. 3. No new lesions. 4. Progressive moderate left foraminal narrowing at C3-4. 5. Moderate left and mild right foraminal narrowing at C4-5. 6. Moderate foraminal narrowing bilaterally at C6-7. Electronically Signed   By: Marin Roberts M.D.   On: 07/06/2021 11:30   DG Chest Portable 1 View  Result Date: 07/06/2021 CLINICAL DATA:  Weakness for 2-3 days EXAM: PORTABLE CHEST 1 VIEW COMPARISON:  04/29/2017 FINDINGS: Cardiac shadow is within normal limits. Lungs are well aerated bilaterally. No focal infiltrate or sizable effusion is seen. Minimal scarring in the left base is noted. No bony abnormality is seen. IMPRESSION: No acute abnormality noted. Electronically Signed   By:  Alcide Clever M.D.   On: 07/06/2021 02:46    Microbiology: Recent Results (from the past 240 hour(s))  Resp Panel by RT-PCR (Flu A&B, Covid) Nasopharyngeal Swab     Status: Abnormal    Collection Time: 07/06/21  2:25 AM   Specimen: Nasopharyngeal Swab; Nasopharyngeal(NP) swabs in vial transport medium  Result Value Ref Range Status   SARS Coronavirus 2 by RT PCR POSITIVE (A) NEGATIVE Final    Comment: RESULT CALLED TO, READ BACK BY AND VERIFIED WITH: SAPPELT,J @ 0333 ON 07/06/21 BY JUW (NOTE) SARS-CoV-2 target nucleic acids are DETECTED.  The SARS-CoV-2 RNA is generally detectable in upper respiratory specimens during the acute phase of infection. Positive results are indicative of the presence of the identified virus, but do not rule out bacterial infection or co-infection with other pathogens not detected by the test. Clinical correlation with patient history and other diagnostic information is necessary to determine patient infection status. The expected result is Negative.  Fact Sheet for Patients: BloggerCourse.com  Fact Sheet for Healthcare Providers: SeriousBroker.it  This test is not yet approved or cleared by the [place] FDA and  has been authorized for detection and/or diagnosis of SARS-CoV-2 by FDA under an Emergency Use Authorization (EUA).  This EUA will remain in effect (meaning this test can b e used) for the duration of  the COVID-19 declaration under Section 564(b)(1) of the Act, 21 U.S.C. section 360bbb-3(b)(1), unless the authorization is terminated or revoked sooner.     Influenza A by PCR NEGATIVE NEGATIVE Final   Influenza B by PCR NEGATIVE NEGATIVE Final    Comment: (NOTE) The Xpert Xpress SARS-CoV-2/FLU/RSV plus assay is intended as an aid in the diagnosis of influenza from Nasopharyngeal swab specimens and should not be used as a sole basis for treatment. Nasal washings and aspirates are unacceptable for Xpert Xpress SARS-CoV-2/FLU/RSV testing.  Fact Sheet for Patients: BloggerCourse.com  Fact Sheet for Healthcare  Providers: SeriousBroker.it  This test is not yet approved or cleared by the [place] FDA and has been authorized for detection and/or diagnosis of SARS-CoV-2 by FDA under an Emergency Use Authorization (EUA). This EUA will remain in effect (meaning this test can be used) for the duration of the COVID-19 declaration under Section 564(b)(1) of the Act, 21 U.S.C. section 360bbb-3(b)(1), unless the authorization is terminated or revoked.  Performed at [facility], [address], [place], [place] [address]      Labs: Basic Metabolic Panel: Recent Labs  Lab 07/06/21 0155 07/07/21 0720 07/08/21 0645 07/09/21 0604 07/10/21 0622  NA 137 139 141 139 141  K 3.8 3.7 4.2 3.7 3.7  CL 104 103 108 107 109  CO2 26 25 24 25 23   GLUCOSE 118* 93 134* 160* 154*  BUN 7* 10 20 24* 26*  CREATININE 0.84 0.89 0.91 0.84 0.76  CALCIUM 8.9 9.3 9.3 9.1 9.1  MG  --   --  2.1  --   --   PHOS  --   --  3.0  --   --    Liver Function Tests: Recent Labs  Lab 07/06/21 0155 07/07/21 0720 07/08/21 0645 07/09/21 0604 07/10/21 0622  AST 51* 107* 86* 67* 58*  ALT 60* 162* 111* 94* 99*  ALKPHOS 75 90 81 69 64  BILITOT 0.7 0.6 1.2 0.7 0.8  PROT 7.0 8.1 7.1 6.7 6.7  ALBUMIN 4.3 4.7 4.0 3.7 3.7   No results for input(s): LIPASE, AMYLASE in the last 168 hours. No results for  input(s): AMMONIA in the last 168 hours. CBC: Recent Labs  Lab 07/06/21 0155 07/07/21 0720 07/08/21 0645 07/09/21 0604 07/10/21 0622  WBC 6.4 8.9 10.2 10.1 12.9*  NEUTROABS 5.2  --   --   --   --   HGB 14.1 15.6 15.0 14.3 14.0  HCT 41.2 46.7 44.5 43.2 42.3  MCV 91.4 94.2 90.4 90.6 90.2  PLT 168 180 193 203 205   Cardiac Enzymes: Recent Labs  Lab 07/06/21 0155  CKTOTAL 203   BNP: Invalid input(s): POCBNP CBG: Recent Labs  Lab 07/06/21 0154  GLUCAP 108*    Time coordinating discharge:  36 minutes  Signed:  Catarina Hartshorn, DO [facility] Pager: [phone] 07/10/2021, 11:26  AM

## 2021-07-10 NOTE — Progress Notes (Signed)
Wife brought wheelchair.  Patient dressed self and CNA walked patient down to personal car.  No s/s of distress noted denies pain at this time.

## 2021-07-10 NOTE — Progress Notes (Signed)
Patient given IV steroid and PIV was removed.  Patient then educated on discharge instructions and medication administration.  All questions were answered and patient stated that his wife would be bringing his wheelchair shortly for discharge.  Educated on wearing his mask at home especially any time his wife was coming in to care for him.

## 2021-07-10 NOTE — TOC Transition Note (Signed)
Transition of Care [facility]) - CM/SW Discharge Note   Patient Details  Name: Chad Vasquez MRN: [id number] Date of Birth: Apr 14, 1961  Transition of Care [facility]) CM/SW Contact:  Barry Brunner, LCSW Phone Number: 07/10/2021, 2:30 PM   Clinical Narrative:    CSW notified of patient's readiness for discharge. CSW notified Bonita Quin with [facility] of patient's discharge. Bonita Quin agreeable provide [facility] services upon discharge. TOC signing off.    Final next level of care: Home w Home Health Services Barriers to Discharge: Barriers Resolved   Patient Goals and CMS Choice Patient states their goals for this hospitalization and ongoing recovery are:: Return home with home health CMS Medicare.gov Compare Post Acute Care list provided to:: Patient Choice offered to / list presented to : Patient  Discharge Placement                    Patient and family notified of of transfer: 07/10/21  Discharge Plan and Services                          HH Arranged: OT, PT [facility] Agency: [facility] Date HH Agency Contacted: 07/10/21 Time HH Agency Contacted: 1429 Representative spoke with at [facility] Agency: Bonita Quin  Social Determinants of Health (SDOH) Interventions     Readmission Risk Interventions No flowsheet data found.

## 2021-07-11 DIAGNOSIS — K222 Esophageal obstruction: Secondary | ICD-10-CM | POA: Diagnosis not present

## 2021-07-11 DIAGNOSIS — E669 Obesity, unspecified: Secondary | ICD-10-CM | POA: Diagnosis not present

## 2021-07-11 DIAGNOSIS — U071 COVID-19: Secondary | ICD-10-CM | POA: Diagnosis not present

## 2021-07-11 DIAGNOSIS — J441 Chronic obstructive pulmonary disease with (acute) exacerbation: Secondary | ICD-10-CM | POA: Diagnosis not present

## 2021-07-11 DIAGNOSIS — Z6831 Body mass index (BMI) 31.0-31.9, adult: Secondary | ICD-10-CM | POA: Diagnosis not present

## 2021-07-11 DIAGNOSIS — I10 Essential (primary) hypertension: Secondary | ICD-10-CM | POA: Diagnosis not present

## 2021-07-11 DIAGNOSIS — F418 Other specified anxiety disorders: Secondary | ICD-10-CM | POA: Diagnosis not present

## 2021-07-11 DIAGNOSIS — I2510 Atherosclerotic heart disease of native coronary artery without angina pectoris: Secondary | ICD-10-CM | POA: Diagnosis not present

## 2021-07-11 DIAGNOSIS — K219 Gastro-esophageal reflux disease without esophagitis: Secondary | ICD-10-CM | POA: Diagnosis not present

## 2021-07-11 DIAGNOSIS — G8929 Other chronic pain: Secondary | ICD-10-CM | POA: Diagnosis not present

## 2021-07-11 DIAGNOSIS — Z87891 Personal history of nicotine dependence: Secondary | ICD-10-CM | POA: Diagnosis not present

## 2021-07-11 DIAGNOSIS — G35 Multiple sclerosis: Secondary | ICD-10-CM | POA: Diagnosis not present

## 2021-07-11 DIAGNOSIS — E785 Hyperlipidemia, unspecified: Secondary | ICD-10-CM | POA: Diagnosis not present

## 2021-07-11 DIAGNOSIS — Z79891 Long term (current) use of opiate analgesic: Secondary | ICD-10-CM | POA: Diagnosis not present

## 2021-07-11 DIAGNOSIS — E0781 Sick-euthyroid syndrome: Secondary | ICD-10-CM | POA: Diagnosis not present

## 2021-07-11 DIAGNOSIS — G4733 Obstructive sleep apnea (adult) (pediatric): Secondary | ICD-10-CM | POA: Diagnosis not present

## 2021-07-15 DIAGNOSIS — D869 Sarcoidosis, unspecified: Secondary | ICD-10-CM | POA: Diagnosis not present

## 2021-07-15 DIAGNOSIS — U071 COVID-19: Secondary | ICD-10-CM | POA: Diagnosis not present

## 2021-07-15 DIAGNOSIS — G35 Multiple sclerosis: Secondary | ICD-10-CM | POA: Diagnosis not present

## 2021-07-15 DIAGNOSIS — E099 Drug or chemical induced diabetes mellitus without complications: Secondary | ICD-10-CM | POA: Diagnosis not present

## 2021-07-27 DIAGNOSIS — J449 Chronic obstructive pulmonary disease, unspecified: Secondary | ICD-10-CM | POA: Diagnosis not present

## 2021-07-27 DIAGNOSIS — E039 Hypothyroidism, unspecified: Secondary | ICD-10-CM | POA: Diagnosis not present

## 2021-07-27 DIAGNOSIS — E785 Hyperlipidemia, unspecified: Secondary | ICD-10-CM | POA: Diagnosis not present

## 2021-07-27 DIAGNOSIS — Z1331 Encounter for screening for depression: Secondary | ICD-10-CM | POA: Diagnosis not present

## 2021-07-27 DIAGNOSIS — F419 Anxiety disorder, unspecified: Secondary | ICD-10-CM | POA: Diagnosis not present

## 2021-07-27 DIAGNOSIS — G35 Multiple sclerosis: Secondary | ICD-10-CM | POA: Diagnosis not present

## 2021-07-27 DIAGNOSIS — E559 Vitamin D deficiency, unspecified: Secondary | ICD-10-CM | POA: Diagnosis not present

## 2021-07-27 DIAGNOSIS — D869 Sarcoidosis, unspecified: Secondary | ICD-10-CM | POA: Diagnosis not present

## 2021-07-30 DIAGNOSIS — G8929 Other chronic pain: Secondary | ICD-10-CM | POA: Diagnosis not present

## 2021-07-30 DIAGNOSIS — U071 COVID-19: Secondary | ICD-10-CM | POA: Diagnosis not present

## 2021-07-30 DIAGNOSIS — G35 Multiple sclerosis: Secondary | ICD-10-CM | POA: Diagnosis not present

## 2021-07-30 DIAGNOSIS — J441 Chronic obstructive pulmonary disease with (acute) exacerbation: Secondary | ICD-10-CM | POA: Diagnosis not present

## 2021-10-21 DIAGNOSIS — G894 Chronic pain syndrome: Secondary | ICD-10-CM | POA: Diagnosis not present

## 2021-10-21 DIAGNOSIS — G35 Multiple sclerosis: Secondary | ICD-10-CM | POA: Diagnosis not present

## 2021-10-21 DIAGNOSIS — R5382 Chronic fatigue, unspecified: Secondary | ICD-10-CM | POA: Diagnosis not present

## 2021-10-21 DIAGNOSIS — Z79899 Other long term (current) drug therapy: Secondary | ICD-10-CM | POA: Diagnosis not present

## 2021-10-23 DIAGNOSIS — Z23 Encounter for immunization: Secondary | ICD-10-CM | POA: Diagnosis not present

## 2021-11-18 DIAGNOSIS — Z1283 Encounter for screening for malignant neoplasm of skin: Secondary | ICD-10-CM | POA: Diagnosis not present

## 2021-11-18 DIAGNOSIS — D239 Other benign neoplasm of skin, unspecified: Secondary | ICD-10-CM | POA: Diagnosis not present

## 2021-11-18 DIAGNOSIS — L218 Other seborrheic dermatitis: Secondary | ICD-10-CM | POA: Diagnosis not present

## 2022-03-04 DIAGNOSIS — G35 Multiple sclerosis: Secondary | ICD-10-CM | POA: Diagnosis not present

## 2022-03-04 DIAGNOSIS — G894 Chronic pain syndrome: Secondary | ICD-10-CM | POA: Diagnosis not present

## 2022-03-04 DIAGNOSIS — M62831 Muscle spasm of calf: Secondary | ICD-10-CM | POA: Diagnosis not present

## 2022-03-04 DIAGNOSIS — G8221 Paraplegia, complete: Secondary | ICD-10-CM | POA: Diagnosis not present

## 2022-06-03 ENCOUNTER — Telehealth: Payer: Self-pay | Admitting: *Deleted

## 2022-06-03 ENCOUNTER — Encounter: Payer: Self-pay | Admitting: *Deleted

## 2022-06-03 NOTE — Patient Outreach (Signed)
  Care Coordination   Initial Visit Note   06/03/2022 Name: Chad Vasquez MRN: [id number] DOB: 01/25/1961  Chad Vasquez is a 61 y.o. year old male who sees Chad Found, MD for primary care. I spoke with  Chad Vasquez by phone today.  What matters to the patients health and wellness today?  Ongoing management of medical conditions. Would like to follow-up with RNCC.    Goals Addressed             This Visit's Progress    Care Coordination Services       Care Coordination Interventions: Reviewed medications with patient and discussed affordability Provided patient and/or caregiver with verbal information about [facility] Care Coordination Services (community resource) Reviewed scheduled/upcoming provider appointments including AWV with PCP office in September Advised patient to discuss switching from prescription Vit D with provider Assessed social determinant of health barriers Scheduled appointment for follow-up with Chad Durie, RN Care Coordinator [phone]) on 07/05/22 at 11:30 Assessed for family/social support Discussed mobility and ability to perform ADLs          SDOH assessments and interventions completed:  Yes  SDOH Interventions Today    Flowsheet Row Most Recent Value  SDOH Interventions   Financial Strain Interventions Intervention Not Indicated  Housing Interventions Intervention Not Indicated  Transportation Interventions Intervention Not Indicated        Care Coordination Interventions Activated:  Yes  Care Coordination Interventions:  Yes, provided   Follow up plan: Follow up call scheduled for 07/05/22 at 11:30 with Chad Durie, RN Care Coordinator  and Chad Bad, LCSW  Encounter Outcome:  Pt. Visit Completed   Chad Vasquez, BSN, RN-BC RN Care Coordinator [facility] Direct Dial: [phone]

## 2022-06-10 DIAGNOSIS — E559 Vitamin D deficiency, unspecified: Secondary | ICD-10-CM | POA: Diagnosis not present

## 2022-06-10 DIAGNOSIS — Z23 Encounter for immunization: Secondary | ICD-10-CM | POA: Diagnosis not present

## 2022-06-17 ENCOUNTER — Encounter: Payer: Self-pay | Admitting: *Deleted

## 2022-06-17 ENCOUNTER — Ambulatory Visit: Payer: Self-pay | Admitting: *Deleted

## 2022-06-17 NOTE — Patient Outreach (Signed)
  Care Coordination   Initial Visit Note   06/17/2022  Name: TEDFORD BERG MRN: [id number] DOB: [birth year]  NEFI MUSICH is a 61 y.o. year old male who sees Assunta Found, MD for primary care. [name]  What matters to the patients health and wellness today?  Find Help in My Community.    Goals Addressed               This Visit's Progress     Find Help in My Community. (pt-stated)   On track     Care Coordination Interventions:   Discussed plans with patient for ongoing care management follow up. Provided patient with direct contact information for care management team members. Screening for signs and symptoms of depression related to chronic disease state.  Assessed social determinant of health barriers. Solution-Focused strategies employed. Active listening/reflection utilized.  Problem solving/task-centered strategies developed. Quality of sleep assessed and sleep hygiene techniques promoted.  Discussed caregiver resources and support. Collaborated with Primary Care Provider, Dr. Assunta Found, regarding the following concerns: Needs referral to Neurologist - Preferably Dr. Melvyn Novas, Neurologist at [facility] [phone]). Needs new power wheelchair - Preferably through [employer]: Scientist, water quality [phone]). Needs evaluation for new power wheelchair (Specifications: [phone]).  Encouraged patient to schedule follow-up appointment with Primary Care Provider, Dr. Assunta Found.           SDOH assessments and interventions completed:  Yes.  SDOH Interventions Today    Flowsheet Row Most Recent Value  SDOH Interventions   Food Insecurity Interventions Intervention Not Indicated  Housing Interventions Intervention Not Indicated  Transportation Interventions Intervention Not Indicated  Utilities Interventions Intervention Not Indicated  Alcohol Usage Interventions  Intervention Not Indicated (Score <7)  Financial Strain Interventions Intervention Not Indicated  Physical Activity Interventions Patient Refused  Stress Interventions Intervention Not Indicated  Social Connections Interventions Intervention Not Indicated        Care Coordination Interventions Activated:  Yes.   Care Coordination Interventions:  Yes, provided.   Follow up plan: Follow up call scheduled for 06/24/2022 at 9:30 am.  Encounter Outcome:  Pt. Visit Completed.   [name], BSW, MSW, LCSW  Licensed Restaurant manager, fast food [facility] System  Mailing [place] [address] [place], [place] [address] Physical Address-[address], [place], [place] [address] Toll Free Main # [phone] Fax # [phone] Cell # [phone] [email or web address]

## 2022-06-17 NOTE — Patient Instructions (Signed)
Visit Information  Thank you for taking time to visit with me today. Please don't hesitate to contact me if I can be of assistance to you.   Following are the goals we discussed today:   Goals Addressed               This Visit's Progress     Find Help in My Community. (pt-stated)   On track     Care Coordination Interventions:   Discussed plans with patient for ongoing care management follow up. Provided patient with direct contact information for care management team members. Screening for signs and symptoms of depression related to chronic disease state.  Assessed social determinant of health barriers. Solution-Focused strategies employed. Active listening/reflection utilized.  Problem solving/task-centered strategies developed. Quality of sleep assessed and sleep hygiene techniques promoted.  Discussed caregiver resources and support. Collaborated with Primary Care Provider, Dr. Assunta Found, regarding the following concerns: Needs referral to Neurologist - Preferably Dr. Melvyn Novas, Neurologist at [facility] [phone]). Needs new power wheelchair - Preferably through [employer]: Scientist, water quality [phone]). Needs evaluation for new power wheelchair (Specifications: [phone]).  Encouraged patient to schedule follow-up appointment with Primary Care Provider, Dr. Assunta Found.           Our next appointment is by telephone on 06/24/2022 at 9:30 am.  Please call the care guide team at [phone] if you need to cancel or reschedule your appointment.   If you are experiencing a Mental Health or Behavioral Health Crisis or need someone to talk to, please call the Suicide and Crisis Lifeline: 988 call the [place] National Suicide Prevention Lifeline: 724-716-3374 or TTY: 236-348-5062 TTY 901-023-9623) to talk to a trained counselor call 1-800-273-TALK (toll free, 24 hour hotline) go to [facility] [address], [place] [phone]) call the [facility] Crisis Line: [phone] call 911  Patient verbalizes understanding of instructions and care plan provided today and agrees to view in MyChart. Active MyChart status and patient understanding of how to access instructions and care plan via MyChart confirmed with patient.     Telephone follow up appointment with care management team member scheduled for:  06/24/2022 at 9:30 am.  [name], BSW, MSW, LCSW  Licensed Clinical Social Worker  [facility]  Mailing [place]. [address] [place], [place] [address] Physical Address[address], [place], [place] [address] Toll Free Main # [phone] Fax # [phone] Cell # [phone] [email or web address]

## 2022-06-24 ENCOUNTER — Encounter: Payer: Self-pay | Admitting: *Deleted

## 2022-06-24 ENCOUNTER — Ambulatory Visit: Payer: Self-pay | Admitting: *Deleted

## 2022-06-24 NOTE — Patient Outreach (Signed)
  Care Coordination   Follow Up Visit Note   06/24/2022  Name: Chad Vasquez MRN: [id number] DOB: 1960/12/24  Chad Vasquez is a 61 y.o. year old male who sees Sharilyn Sites, MD for primary care. I spoke with Chad Vasquez by phone today.  What matters to the patients health and wellness today?   Find Help in My Community.   Goals Addressed               This Visit's Progress     Find Help in My Community. (pt-stated)   On track     Care Coordination Interventions:   Active listening/reflection utilized.  Problem solving/task-centered strategies employed. Continued collaboration with Primary Care Provider, Dr. Sharilyn Sites, to address the following concerns: Needs referral to Neurologist - Preferably Dr. Larey Seat, Neurologist at [facility] [phone]). Needs new power wheelchair - Preferably through [employer]: Architectural technologist [phone]). Needs evaluation for new power wheelchair (Specifications: [phone]).  Encouraged patient to schedule follow-up appointment with Primary Care Provider, Dr. Sharilyn Sites.            Follow up appointment with Primary Care Provider, Dr. Sharilyn Sites, scheduled              on 06/29/2022.         SDOH assessments and interventions completed:  Yes.  Care Coordination Interventions Activated:  Yes.   Care Coordination Interventions:  Yes, provided.   Follow up plan: Follow up call scheduled for 07/02/2022 at 8:30 am.  Encounter Outcome:  Pt. Visit Completed.   Chad Vasquez, BSW, MSW, LCSW  Licensed Education officer, environmental Health System  Mailing [place] [address] [place], [place] [address] Physical Address-[address] [place], [place] [address] Toll Free Main # [phone] Fax # [phone] Cell # [phone] [email or web address]

## 2022-06-24 NOTE — Patient Instructions (Signed)
Visit Information  Thank you for taking time to visit with me today. Please don't hesitate to contact me if I can be of assistance to you.   Following are the goals we discussed today:   Goals Addressed               This Visit's Progress     Find Help in My Community. (pt-stated)   On track     Care Coordination Interventions:   Active listening/reflection utilized.  Problem solving/task-centered strategies employed. Continued collaboration with Primary Care Provider, Dr. Sharilyn Sites, to address the following concerns: Needs referral to Neurologist - Preferably Dr. Larey Seat, Neurologist at [facility] [phone]). Needs new power wheelchair - Preferably through [employer]: Architectural technologist [phone]). Needs evaluation for new power wheelchair (Specifications: [phone]).  Encouraged patient to schedule follow-up appointment with Primary Care Provider, Dr. Sharilyn Sites.    Follow up appointment with Primary Care Provider, Dr. Sharilyn Sites, scheduled on 06/28/2022.         Our next appointment is by telephone on 07/02/2022 at 8:30 am.  Please call the care guide team at [phone] if you need to cancel or reschedule your appointment.   If you are experiencing a Mental Health or [place] or need someone to talk to, please call the Suicide and Crisis Lifeline: 988 call the [place] National Suicide Prevention Lifeline: 571-210-2616 or TTY: 360 738 5578 TTY 603-821-1590) to talk to a trained counselor call 1-800-273-TALK (toll free, 24 hour hotline) go to [facility] [address], [place] [phone]) call the [place]: [phone] call 911  Patient verbalizes understanding of instructions and care plan provided today and agrees to view in [place]. Active MyChart status and patient understanding of how to access instructions and care plan via  MyChart confirmed with patient.     Telephone follow up appointment with care management team member scheduled for: 07/02/2022 at 8:30 am.  Nat Christen, BSW, MSW, [place]  Licensed Clinical Social Worker  [place]  Mailing [place]. [address], [place], [place] [address] Physical Address-[address] [place], [place] [address] Toll Free Main # [phone] Fax # [phone] Cell # [phone] [email or web address]

## 2022-07-02 ENCOUNTER — Ambulatory Visit: Payer: Self-pay | Admitting: *Deleted

## 2022-07-03 DIAGNOSIS — E785 Hyperlipidemia, unspecified: Secondary | ICD-10-CM | POA: Diagnosis not present

## 2022-07-03 DIAGNOSIS — J449 Chronic obstructive pulmonary disease, unspecified: Secondary | ICD-10-CM | POA: Diagnosis not present

## 2022-07-03 DIAGNOSIS — E039 Hypothyroidism, unspecified: Secondary | ICD-10-CM | POA: Diagnosis not present

## 2022-07-04 ENCOUNTER — Encounter: Payer: Self-pay | Admitting: *Deleted

## 2022-07-04 NOTE — Patient Outreach (Signed)
  Care Coordination   Follow Up Visit Note   07/04/2022  Name: Chad Vasquez MRN: [id number] DOB: Nov 23, 1960  Chad Vasquez is a 61 y.o. year old male who sees Sharilyn Sites, MD for primary care. I spoke with [name] by phone today.  What matters to the patients health and wellness today?  Find Help in My Community.    Goals Addressed               This Visit's Progress     Find Help in My Community. (pt-stated)   On track     Care Coordination Interventions:   Active listening/reflection utilized.  Problem solving/task-centered strategies employed. Continued collaboration with Primary Care Provider, Dr. Sharilyn Sites, to address the following concerns: Needs referral to Neurologist - Preferably Dr. Larey Seat, Neurologist at [facility] [phone]). ~ In progress Needs new power wheelchair - Preferably through [employer]: Architectural technologist [phone]). ~ In progress Needs evaluation for new power wheelchair (Specifications: [phone]). ~ In progress         SDOH assessments and interventions completed:  Yes.  Care Coordination Interventions Activated:  Yes.   Care Coordination Interventions:  Yes, provided.   Follow up plan: Follow up call scheduled for 07/16/2022 at 9:00 am.  Encounter Outcome:  Pt. Visit Completed.   Nat Christen, BSW, MSW, LCSW  Licensed Education officer, environmental [facility] System  Mailing [place] [address] [place], [place] [address] Physical Address-[address], [place], [place] [address] Toll Free Main # [phone] Fax # [phone] Cell # [phone] [email or web address]

## 2022-07-04 NOTE — Patient Instructions (Signed)
Visit Information  Thank you for taking time to visit with me today. Please don't hesitate to contact me if I can be of assistance to you.   Following are the goals we discussed today:   Goals Addressed               This Visit's Progress     Find Help in My Community. (pt-stated)   On track     Care Coordination Interventions:   Active listening/reflection utilized.  Problem solving/task-centered strategies employed. Continued collaboration with Primary Care Provider, Dr. Sharilyn Sites, to address the following concerns: Needs referral to Neurologist - Preferably Dr. Larey Seat, Neurologist at [facility] [phone]). ~ In progress Needs new power wheelchair - Preferably through [employer]: Architectural technologist [phone]). ~ In progress Needs evaluation for new power wheelchair (Specifications: [phone]). ~ In progress         Our next appointment is by telephone on 07/16/2022 at 9:00 am.  Please call the care guide team at [phone] if you need to cancel or reschedule your appointment.   If you are experiencing a Mental Health or [place] or need someone to talk to, please call the Suicide and Crisis Lifeline: 988 call the [place] National Suicide Prevention Lifeline: 352-771-5012 or TTY: (419)828-1564 TTY (405) 590-0685) to talk to a trained counselor call 1-800-273-TALK (toll free, 24 hour hotline) go to [facility] [address], [place] [phone]) call the [place]: [phone] call 911  Patient verbalizes understanding of instructions and care plan provided today and agrees to view in [place]. Active MyChart status and patient understanding of how to access instructions and care plan via MyChart confirmed with patient.     Telephone follow up appointment with care management team member scheduled for:  07/16/2022 at 9:00 am.  Nat Christen, BSW, MSW, [place]  Licensed Clinical Social Worker  [place]  Mailing [place]. [address] [place], [place] [address] Physical Address-[address] [place], [place] [address] Toll Free Main # [phone] Fax # [phone] Cell # [phone] [email or web address]

## 2022-07-05 ENCOUNTER — Ambulatory Visit: Payer: Self-pay | Admitting: *Deleted

## 2022-07-05 NOTE — Patient Outreach (Signed)
  Care Coordination   07/05/2022 Name: CARLIE CORPUS MRN: [id number] DOB: September 26, 1961   Care Coordination Outreach Attempts:  An unsuccessful telephone outreach was attempted for a scheduled appointment today.  Follow Up Plan:  Additional outreach attempts will be made to offer the patient care coordination information and services.   Encounter Outcome:  No Answer  Care Coordination Interventions Activated:  No   Care Coordination Interventions:  No, not indicated    Scheduled for follow-up telephone call with Valente David, RN Care Coordinator [phone]) for 07/12/22 at 2:00. Left HIPAA compliant voice mail message with appointment information and requested that he return my call at [phone] with any questions or concerns regarding current needs or scheduled appointment.   Chong Sicilian, BSN, RN-BC RN Care Coordinator [place] Direct Dial: [phone] Main #: [phone]

## 2022-07-12 ENCOUNTER — Ambulatory Visit: Payer: Self-pay | Admitting: *Deleted

## 2022-07-12 NOTE — Patient Instructions (Signed)
Visit Information  Thank you for taking time to visit with me today. Please don't hesitate to contact me if I can be of assistance to you before our next scheduled telephone appointment.  Following are the goals we discussed today:  Purchase foot boots to relive pressure when lying.  Our next appointment is by telephone on 11/9  Please call the care guide team at [phone] if you need to cancel or reschedule your appointment.   Please call the Suicide and Crisis Lifeline: 988 call the [place] National Suicide Prevention Lifeline: 850-716-7472 or TTY: 380-107-8757 TTY 929 669 4367) to talk to a trained counselor call 1-800-273-TALK (toll free, 24 hour hotline) call the [facility]: (770) 326-0821 call 911 if you are experiencing a Mental Health or [place] or need someone to talk to.  The patient verbalized understanding of instructions, educational materials, and care plan provided today and agreed to receive a mailed copy of patient instructions, educational materials, and care plan.   The patient has been provided with contact information for the care management team and has been advised to call with any health related questions or concerns.   Valente David, RN, MSN, [place] Care Management Care Management Coordinator [phone]

## 2022-07-12 NOTE — Patient Outreach (Signed)
  Care Coordination   Follow Up Visit Note   07/12/2022 Name: FARHAN JEAN MRN: [id number] DOB: 1960/12/23  ALARIC GLADWIN is a 61 y.o. year old male who sees Sharilyn Sites, MD for primary care. I spoke with  [name] by phone today.  What matters to the patients health and wellness today?  Report he has dark spots on feet, has not obtained foot boots for when lying in bed.  Denies they are painful or has any open areas.  Otherwise, state he is well.  Denies any urgent concerns, encouraged to contact this care manager with questions.    Goals Addressed             This Visit's Progress    Care Coordination Services       Care Coordination Interventions: Evaluation of current treatment plan related to dark spots on feet/ankles and patient's adherence to plan as established by provider Advised patient to purchase cushion boots/pads as instructed to alleviate pressure from feet Reviewed medications with patient and discussed affordability Reviewed scheduled/upcoming provider appointments including PCP visit on 10/25 Encouraged to discuss concern regarding heat in the home with CSW on Friday during telephone visit          SDOH assessments and interventions completed:  No     Care Coordination Interventions Activated:  Yes  Care Coordination Interventions:  Yes, provided   Follow up plan: Follow up call scheduled for 11/9    Encounter Outcome:  Pt. Visit Completed   Valente David, RN, MSN, [place] [facility] Management Care Management Coordinator [phone]

## 2022-07-15 ENCOUNTER — Other Ambulatory Visit: Payer: Self-pay

## 2022-07-15 ENCOUNTER — Encounter (HOSPITAL_COMMUNITY): Payer: Medicare Other

## 2022-07-16 ENCOUNTER — Encounter: Payer: Self-pay | Admitting: *Deleted

## 2022-07-16 ENCOUNTER — Ambulatory Visit: Payer: Self-pay | Admitting: *Deleted

## 2022-07-16 NOTE — Patient Instructions (Signed)
Visit Information  Thank you for taking time to visit with me today. Please don't hesitate to contact me if I can be of assistance to you.   Following are the goals we discussed today:   Goals Addressed               This Visit's Progress     Find Help in My Community. (pt-stated)   On track     Care Coordination Interventions:   Active listening/reflection utilized.  Problem solving/task-centered strategies employed. Verbalization of feelings encouraged. Emotional support provided. Attend follow-up appointment with Primary Care Provider, Dr. Sharilyn Sites, scheduled on 07/28/2022, at which time you can further address the following concerns: ~ Referral to neurologist, preferably Dr. Larey Seat, Neurologist at [facility] [phone]). ~ In progress ~ Referral for new power wheelchair, preferably through [employer]: Architectural technologist [phone]). ~ In progress ~ Referral for evaluation of new power wheelchair (Specifications: [phone]). ~ In progress CSW collaboration with [place] to [facility] referral for financial assistance and resources to obtain heat in the home.         Our next appointment is by telephone on 07/30/2022 at 10:00 am.  Please call the care guide team at [phone] if you need to cancel or reschedule your appointment.   If you are experiencing a Mental Health or [place] or need someone to talk to, please call the Suicide and Crisis Lifeline: 988 call the [place] National Suicide Prevention Lifeline: (616) 413-3362 or TTY: 302-315-5106 TTY (318)807-3119) to talk to a trained counselor call 1-800-273-TALK (toll free, 24 hour hotline) go to [facility] [address], [place] [phone]) call the [place]: [phone] call 911  Patient verbalizes understanding of instructions and care plan provided today and agrees to  view in [place]. Active MyChart status and patient understanding of how to access instructions and care plan via MyChart confirmed with patient.     Telephone follow up appointment with care management team member scheduled for:  07/30/2022 at 10:00 am.  Nat Christen, BSW, MSW, [place]  Licensed Clinical Social Worker  [place]  Mailing [place]. [address] [place], [place] [address] Physical Address-[address] [place], [place] [address] Toll Free Main # [phone] Fax # [phone] Cell # [phone] [email or web address]

## 2022-07-16 NOTE — Patient Outreach (Signed)
  Care Coordination   Follow Up Visit Note   07/16/2022  Name: Chad Vasquez MRN: [id number] DOB: November 21, 1960  Chad Vasquez is a 61 y.o. year old male who sees Sharilyn Sites, MD for primary care. I spoke with [name] by phone today.  What matters to the patients health and wellness today?  Find Help in My Community.    Goals Addressed               This Visit's Progress     Find Help in My Community. (pt-stated)   On track     Care Coordination Interventions:   Active listening/reflection utilized.  Problem solving/task-centered strategies employed. Verbalization of feelings encouraged. Emotional support provided. Attend follow-up appointment with Primary Care Provider, Dr. Sharilyn Sites, scheduled on 07/28/2022, at which time you can further address the following concerns: ~ Referral to neurologist, preferably Dr. Larey Seat, Neurologist at [facility] [phone]). ~ In progress ~ Referral for new power wheelchair, preferably through [employer]: Architectural technologist [phone]). ~ In progress ~ Referral for evaluation of new power wheelchair (Specifications: [phone]). ~ In progress CSW collaboration with [place] to place referral for financial assistance and resources to obtain heat in the home.       SDOH assessments and interventions completed:  Yes.   Care Coordination Interventions Activated:  Yes.    Care Coordination Interventions:  Yes, provided.    Follow up plan: Follow up call scheduled for 07/30/2022 at 10:00 am.     Encounter Outcome:  Pt. Visit Completed.    Nat Christen, BSW, MSW, LCSW  Licensed Education officer, environmental Health System  Mailing [place] [address] [place], [place] [address] Physical Address-[address], [place], [place] [address] Toll Free Main # [phone] Fax # [phone] Cell #  [phone] [email or web address]

## 2022-07-22 ENCOUNTER — Encounter (HOSPITAL_COMMUNITY): Admission: RE | Admit: 2022-07-22 | Payer: Medicare Other | Source: Ambulatory Visit

## 2022-07-27 ENCOUNTER — Encounter (HOSPITAL_COMMUNITY): Admission: RE | Admit: 2022-07-27 | Payer: Medicare Other | Source: Ambulatory Visit

## 2022-07-28 DIAGNOSIS — Z1331 Encounter for screening for depression: Secondary | ICD-10-CM | POA: Diagnosis not present

## 2022-07-28 DIAGNOSIS — Z6834 Body mass index (BMI) 34.0-34.9, adult: Secondary | ICD-10-CM | POA: Diagnosis not present

## 2022-07-28 DIAGNOSIS — E785 Hyperlipidemia, unspecified: Secondary | ICD-10-CM | POA: Diagnosis not present

## 2022-07-28 DIAGNOSIS — E559 Vitamin D deficiency, unspecified: Secondary | ICD-10-CM | POA: Diagnosis not present

## 2022-07-28 DIAGNOSIS — E039 Hypothyroidism, unspecified: Secondary | ICD-10-CM | POA: Diagnosis not present

## 2022-07-28 DIAGNOSIS — Z0001 Encounter for general adult medical examination with abnormal findings: Secondary | ICD-10-CM | POA: Diagnosis not present

## 2022-07-28 DIAGNOSIS — J441 Chronic obstructive pulmonary disease with (acute) exacerbation: Secondary | ICD-10-CM | POA: Diagnosis not present

## 2022-07-30 ENCOUNTER — Ambulatory Visit: Payer: Self-pay | Admitting: *Deleted

## 2022-07-30 NOTE — Patient Outreach (Signed)
  Care Coordination   07/30/2022  Name: RASHAUD YBARBO MRN: [id number] DOB: 30-Jan-1961   Care Coordination Outreach Attempts:  An unsuccessful telephone outreach was attempted today to offer the patient information about available care coordination services as a benefit of their health plan. HIPAA compliant messages left on voicemail, providing contact information for CSW, encouraging patient to return CSW's call at his earliest convenience.    Follow Up Plan:  Additional outreach attempts will be made to offer the patient care coordination information and services.    Encounter Outcome:  No Answer.    Care Coordination Interventions Activated:  No.     Care Coordination Interventions:  No, not indicated.     Nat Christen, BSW, MSW, LCSW  Licensed Education officer, environmental Health System  Mailing [place] [address], [place], [place] [address] Physical Address-[address] [place], [place] [address] Toll Free Main # [phone] Fax # [phone] Cell # [phone] [email or web address]

## 2022-08-03 ENCOUNTER — Encounter (HOSPITAL_COMMUNITY): Admission: RE | Admit: 2022-08-03 | Payer: Medicare Other | Source: Ambulatory Visit

## 2022-08-03 DIAGNOSIS — E039 Hypothyroidism, unspecified: Secondary | ICD-10-CM | POA: Diagnosis not present

## 2022-08-03 DIAGNOSIS — E785 Hyperlipidemia, unspecified: Secondary | ICD-10-CM | POA: Diagnosis not present

## 2022-08-03 DIAGNOSIS — J449 Chronic obstructive pulmonary disease, unspecified: Secondary | ICD-10-CM | POA: Diagnosis not present

## 2022-08-04 ENCOUNTER — Encounter: Payer: Medicare Other | Admitting: *Deleted

## 2022-08-04 ENCOUNTER — Encounter (HOSPITAL_COMMUNITY): Payer: Medicare Other

## 2022-08-05 ENCOUNTER — Encounter (HOSPITAL_COMMUNITY): Payer: Medicare Other

## 2022-08-12 ENCOUNTER — Encounter: Payer: Self-pay | Admitting: *Deleted

## 2022-08-12 ENCOUNTER — Ambulatory Visit: Payer: Self-pay | Admitting: *Deleted

## 2022-08-12 NOTE — Patient Outreach (Signed)
  Care Coordination   08/12/2022 Name: Chad Vasquez MRN: [id number] DOB: 18-Jul-1961   Care Coordination Outreach Attempts:  An unsuccessful telephone outreach was attempted for a scheduled appointment today.  Follow Up Plan:  Additional outreach attempts will be made to offer the patient care coordination information and services.   Encounter Outcome:  No Answer  Care Coordination Interventions Activated:  No   Care Coordination Interventions:  No, not indicated    Demetrios Loll, BSN, RN-BC RN Care Coordinator [facility]  [facility] Direct Dial: [phone] Main #: [phone]

## 2022-08-16 ENCOUNTER — Ambulatory Visit: Payer: Self-pay | Admitting: *Deleted

## 2022-08-16 ENCOUNTER — Encounter: Payer: Self-pay | Admitting: *Deleted

## 2022-08-16 DIAGNOSIS — R531 Weakness: Secondary | ICD-10-CM

## 2022-08-16 DIAGNOSIS — G8220 Paraplegia, unspecified: Secondary | ICD-10-CM

## 2022-08-16 DIAGNOSIS — D869 Sarcoidosis, unspecified: Secondary | ICD-10-CM

## 2022-08-16 DIAGNOSIS — K222 Esophageal obstruction: Secondary | ICD-10-CM

## 2022-08-16 NOTE — Patient Outreach (Signed)
  Care Coordination   Follow Up Visit Note   08/16/2022  Name: Chad Vasquez MRN: [id number] DOB: December 15, 1960  Chad Vasquez is a 61 y.o. year old male who sees Assunta Found, MD for primary care. [name]  What matters to the patients health and wellness today?  Find Help in My Community.    Goals Addressed               This Visit's Progress     Find Help in My Community. (pt-stated)   On track     Care Coordination Interventions:   Active listening utilized.  Solution-focused strategies employed. Verbalization of feelings encouraged. Emotional support provided. Client-centered therapy initiated. CSW collaboration with [facility] Guide to place referral for transportation services, resources and assistance.         SDOH assessments and interventions completed:  Yes.   Care Coordination Interventions Activated:  Yes.    Care Coordination Interventions:  Yes, provided.    Follow up plan: Follow up call scheduled for 08/30/2022 at 12:45 pm.   Encounter Outcome:  Pt. Visit Completed.    [name], BSW, MSW, LCSW  Licensed Restaurant manager, fast food [facility] System  Mailing [place] [address] [place], [place] [address] Physical Address-[address], [place], [place] [address] Toll Free Main # [phone] Fax # [phone] Cell # [phone] [email or web address]

## 2022-08-16 NOTE — Patient Instructions (Signed)
Visit Information  Thank you for taking time to visit with me today. Please don't hesitate to contact me if I can be of assistance to you.   Following are the goals we discussed today:   Goals Addressed               This Visit's Progress     Find Help in My Community. (pt-stated)   On track     Care Coordination Interventions:   Active listening utilized.  Solution-focused strategies employed. Verbalization of feelings encouraged. Emotional support provided. Client-centered therapy initiated. CSW collaboration with [facility] Guide to place referral for transportation services, resources and assistance.         Our next appointment is by telephone on 08/30/2022 at 12:45 pm.  Please call the care guide team at [phone] if you need to cancel or reschedule your appointment.   If you are experiencing a Mental Health or Behavioral Health Crisis or need someone to talk to, please call the Suicide and Crisis Lifeline: 988 call the [place] National Suicide Prevention Lifeline: 647-249-6635 or TTY: 419-682-8124 TTY 670-747-3479) to talk to a trained counselor call 1-800-273-TALK (toll free, 24 hour hotline) go to [facility] [address] [place] [phone]) call the [facility] Crisis Line: 470-007-0096 call 911  Patient verbalizes understanding of instructions and care plan provided today and agrees to view in MyChart. Active MyChart status and patient understanding of how to access instructions and care plan via MyChart confirmed with patient.     Telephone follow up appointment with care management team member scheduled for:  08/30/2022 at 12:45 pm.  [name], BSW, MSW, LCSW  Licensed Clinical Social Worker  [facility]  Mailing [place]. [address] [place], [place] [address] Physical Address-[address] [place], [place] [address] Toll Free Main #  [phone] Fax # [phone] Cell # [phone] [email or web address]

## 2022-08-17 ENCOUNTER — Telehealth: Payer: Self-pay | Admitting: *Deleted

## 2022-08-17 ENCOUNTER — Encounter: Payer: Self-pay | Admitting: *Deleted

## 2022-08-17 NOTE — Telephone Encounter (Signed)
   Telephone encounter was:  Successful.  08/17/2022 Name: Chad Vasquez MRN: [id number] DOB: 1961/04/23  Chad Vasquez is a 61 y.o. year old male who is a primary care patient of Assunta Found, MD . The community resource team was consulted for assistance with Transportation Needs   Care guide performed the following interventions: Patient provided with information about care guide support team and interviewed to confirm resource needs.Patient is needing transportation and was provided [employer] and also checking insurance benefits   Follow Up Plan:  No further follow up planned at this time. The patient has been provided with needed resources.  Yehuda Mao Greenauer -[facility] [place], Population Health [phone] [address] [place] , [place] [address] Email : [email or web address]

## 2022-08-30 ENCOUNTER — Other Ambulatory Visit (HOSPITAL_COMMUNITY): Payer: Self-pay | Admitting: Neurology

## 2022-08-30 ENCOUNTER — Ambulatory Visit: Payer: Self-pay | Admitting: *Deleted

## 2022-08-30 ENCOUNTER — Encounter: Payer: Self-pay | Admitting: *Deleted

## 2022-08-30 DIAGNOSIS — M25561 Pain in right knee: Secondary | ICD-10-CM

## 2022-08-30 DIAGNOSIS — G8221 Paraplegia, complete: Secondary | ICD-10-CM | POA: Diagnosis not present

## 2022-08-30 DIAGNOSIS — M25551 Pain in right hip: Secondary | ICD-10-CM

## 2022-08-30 DIAGNOSIS — G35 Multiple sclerosis: Secondary | ICD-10-CM | POA: Diagnosis not present

## 2022-08-30 DIAGNOSIS — G894 Chronic pain syndrome: Secondary | ICD-10-CM | POA: Diagnosis not present

## 2022-08-30 DIAGNOSIS — M62831 Muscle spasm of calf: Secondary | ICD-10-CM | POA: Diagnosis not present

## 2022-08-30 NOTE — Patient Instructions (Signed)
Visit Information  Thank you for taking time to visit with me today. Please don't hesitate to contact me if I can be of assistance to you.   Following are the goals we discussed today:   Goals Addressed               This Visit's Progress     Find Help in My Community. (pt-stated)   On track     Care Coordination Interventions:   Active listening/reflection utilized.  Solution-focused strategies employed. Verbalization of feelings encouraged. Emotional support provided. Transportation agencies, services and resources reviewed, to ensure understanding of how to access. Encouraged to utilize [employer] - [phone]), for reduced cost transportation to and from physician appointments. ~ Fares range from $2.00 to $3.00 each way, depending upon location and destination.  ~ Tickets may be purchased in advance. ~ [employer] will apply to any non-covered medical appointments or reservations. ~ A Three (3) working day notice is required in advance of appointments for all in-county reservation requests. ~ A five (5) working day notice is required in advance of appointments for all out-of-county reservation requests. Encouraged to utilize [employer] (# ([phone] or # [phone]), for free transportation to and from physician appointments, a healthcare facility and/or the pharmacy. For additional transportation agencies, services and resources, please [employer] (# [phone] or # [phone]).      Our next appointment is by telephone on 09/13/2022 at 12:45 pm.  Please call the care guide team at [phone] if you need to cancel or reschedule your appointment.   If you are experiencing a Mental Health or Behavioral Health Crisis or need someone to talk to, please call the Suicide and Crisis Lifeline: 988 call the [place] National Suicide Prevention Lifeline:  (380) 855-7008 or TTY: 226-483-8106 TTY 432-641-5589) to talk to a trained counselor call 1-800-273-TALK (toll free, 24 hour hotline) go to [facility] [address] [place] [phone]) call the [facility] Crisis Line: 340-292-0766 call 911  Patient verbalizes understanding of instructions and care plan provided today and agrees to view in MyChart. Active MyChart status and patient understanding of how to access instructions and care plan via MyChart confirmed with patient.     Telephone follow up appointment with care management team member scheduled for:  09/13/2022 at 12:45 pm.  [name], BSW, MSW, LCSW  Licensed Clinical Social Worker  [facility]  Mailing [place]. [address] [place], [place] [address] Physical Address-[address], [place], [place] [address] Toll Free Main # [phone] Fax # [phone] Cell # [phone] [email or web address]

## 2022-08-30 NOTE — Patient Outreach (Signed)
  Care Coordination   Follow Up Visit Note   08/30/2022  Name: LASHAWN ORREGO MRN: [id number] DOB: [birth year]  Chad Vasquez is a 61 y.o. year old male who sees Assunta Found, MD for primary care. [name]  What matters to the patients health and wellness today?  Find Help in My Community.    Goals Addressed               This Visit's Progress     Find Help in My Community. (pt-stated)   On track     Care Coordination Interventions:   Active listening/reflection utilized.  Solution-focused strategies employed. Verbalization of feelings encouraged. Emotional support provided. Transportation agencies, services and resources reviewed, to ensure understanding of how to access. Encouraged to utilize [employer] - 3126816117), for reduced cost transportation to and from physician appointments. ~ Fares range from $2.00 to $3.00 each way, depending upon location and destination.  ~ Tickets may be purchased in advance. ~ [employer] will apply to any non-covered medical appointments or reservations. ~ A Three (3) working day notice is required in advance of appointments for all in-county reservation requests. ~ A five (5) working day notice is required in advance of appointments for all out-of-county reservation requests. Encouraged to utilize [employer] (# [phone] or # [phone]), for free transportation to and from physician appointments, a healthcare facility and/or the pharmacy. For additional transportation agencies, services and resources, please [employer] (# [phone] or # [phone]).      SDOH assessments and interventions completed:  Yes.  Care Coordination Interventions:  Yes, provided.   Follow up plan: Follow up call scheduled for 09/13/2022 at 12:45 pm.  Encounter Outcome:  Pt. Visit  Completed.   [name], BSW, MSW, LCSW  Licensed Restaurant manager, fast food [facility] System  Mailing [place] [address], [place], [place] [address] Physical Address-[address] [place], [place] [address] Toll Free Main # [phone] Fax # [phone] Cell # [phone] [email or web address]

## 2022-09-07 ENCOUNTER — Ambulatory Visit: Payer: Self-pay | Admitting: *Deleted

## 2022-09-07 ENCOUNTER — Encounter: Payer: Self-pay | Admitting: *Deleted

## 2022-09-07 NOTE — Patient Outreach (Signed)
  Care Coordination   Follow Up Visit Note   09/07/2022 Name: Chad Vasquez MRN: [id number] DOB: 1961-05-20  Chad Vasquez is a 61 y.o. year old male who sees Assunta Found, MD for primary care. I spoke with  [name] by phone today.  What matters to the patients health and wellness today?  Managing nerve pain and b12 injections    Goals Addressed             This Visit's Progress    Care Coordination Services       Care Coordination Interventions: Evaluation of current treatment plan related to b12 deficiency and neuropathy and patient's adherence to plan as established by provider Advised patient to provide appropriate vaccination information to provider or CM team member at next visit Reviewed medications with patient and discussed lyrica and b12 injections. Patient gives monthly self injections but is having trouble drawing up the medication from the vial due to nerve problems in right hand. Discussed Lyrica for neuropathy. Prescribed 50mg  TID. Patient tried it a few days but it made him off balance. He was concerned about falls and discontinued it. It didn't help with pain. Advised that it can take some people longer to see improvement but that being off balance is a concern. Encouraged him to talk with his PCP about this side effect and other potential treatment options.  Discussed plans with patient for ongoing care management follow up and provided patient with direct contact information for care management team Advised patient to discuss receiving monthly b12 injections in the office vs self administration or an OTC methylated B12 which may be better absorbed than an oral cyanocobalamin form with provider Assessed social determinant of health barriers Discussed covid and flu vaccine. Patient's 3rd covid shot was in 10/2021. Questions if he needs additional booster. Reviewed CDC guidelines with patient. He is also going to check with the health dept since that is where he  received his last 3 vaccines.  Provided with RNCC contact number and encouraged to reach out as needed          SDOH assessments and interventions completed:  Yes  SDOH Interventions Today    Flowsheet Row Most Recent Value  SDOH Interventions   Transportation Interventions Intervention Not Indicated  Financial Strain Interventions Intervention Not Indicated        Care Coordination Interventions:  Yes, provided   Follow up plan: Follow up call scheduled for 10/11/22    Encounter Outcome:  Pt. Visit Completed   12/10/22, BSN, RN-BC RN Care Coordinator [facility]  [facility] Direct Dial: [phone] Main #: [phone]

## 2022-09-13 ENCOUNTER — Encounter: Payer: Self-pay | Admitting: *Deleted

## 2022-09-13 ENCOUNTER — Ambulatory Visit: Payer: Self-pay | Admitting: *Deleted

## 2022-09-13 NOTE — Patient Outreach (Signed)
  Care Coordination   Follow Up Visit Note   09/13/2022  Name: Chad Vasquez MRN: [id number] DOB: 1961-06-29  Chad Vasquez is a 61 y.o. year old male who sees Chad Found, MD for primary care. I spoke with Chad Vasquez by phone today.  What matters to the patients health and wellness today?   Find Help in My Community.   Goals Addressed               This Visit's Progress     COMPLETED: Find Help in My Community. (pt-stated)   On track     Care Coordination Interventions:   Active listening/reflection utilized.  Verbalization of feelings encouraged. Emotional support provided. Continue to utilize [employer] - [phone]), for reduced cost transportation to and from physician appointments. Continue to utilize [employer] [phone] or # [phone]), for free transportation to and from physician appointments, a healthcare facility and/or the pharmacy. Continue to utilize [employer] [phone] or # 5871751816), for any additional transportation needs and services. Contact CSW directly (# [phone]), if you change your mind about wanting to receive services, or if additional social work needs are identified in the near future.      SDOH assessments and interventions completed:  Yes.  Care Coordination Interventions:  Yes, provided.   Follow up plan: No further intervention required.   Encounter Outcome:  Pt. Visit Completed.   Chad Vasquez, BSW, MSW, LCSW  Licensed Restaurant manager, fast food [facility] System  Mailing [place] [address] [place], [place] [address] Physical Address-[address], [place], [place] [address] Toll Free Main # [phone] Fax # [phone] Cell # [phone] [email or web address]

## 2022-09-13 NOTE — Patient Instructions (Signed)
Visit Information  Thank you for taking time to visit with me today. Please don't hesitate to contact me if I can be of assistance to you.   Following are the goals we discussed today:   Goals Addressed               This Visit's Progress     COMPLETED: Find Help in My Community. (pt-stated)   On track     Care Coordination Interventions:   Active listening/reflection utilized.  Verbalization of feelings encouraged. Emotional support provided. Continue to utilize [employer] - [phone]), for reduced cost transportation to and from physician appointments. Continue to utilize [employer] [phone] or # [phone]), for free transportation to and from physician appointments, a healthcare facility and/or the pharmacy. Continue to utilize [employer] [phone] or # [phone]), for any additional transportation needs and services. Contact CSW directly (# [phone]), if you change your mind about wanting to receive services, or if additional social work needs are identified in the near future.      Please call the care guide team at [phone] if you need to cancel or reschedule your appointment.   If you are experiencing a Mental Health or Behavioral Health Crisis or need someone to talk to, please call the Suicide and Crisis Lifeline: 988 call the [place] National Suicide Prevention Lifeline: (515) 457-7951 or TTY: 705-770-3036 TTY 226-724-4835) to talk to a trained counselor call 1-800-273-TALK (toll free, 24 hour hotline) go to [facility] [address] [place] [phone]) call the [facility] Crisis Line: 774-364-2239 call 911  Patient verbalizes understanding of instructions and care plan provided today and agrees to view in MyChart. Active MyChart status and patient understanding of how  to access instructions and care plan via MyChart confirmed with patient.     No further follow up required.  [name], BSW, MSW, LCSW  Licensed Restaurant manager, fast food [facility] System  Mailing [place] [address] [place], [place] [address] Physical Address-[address] [place], [place] [address] Toll Free Main # [phone] Fax # [phone] Cell # [phone] [email or web address]

## 2022-10-04 ENCOUNTER — Encounter (HOSPITAL_COMMUNITY): Payer: Self-pay | Admitting: Neurology

## 2022-10-11 ENCOUNTER — Ambulatory Visit: Payer: Self-pay | Admitting: *Deleted

## 2022-10-11 ENCOUNTER — Encounter: Payer: Self-pay | Admitting: *Deleted

## 2022-10-11 NOTE — Patient Outreach (Signed)
  Care Coordination   Follow Up Visit Note   10/11/2022 Name: Chad Vasquez MRN: [id number] DOB: 1961-03-11  Chad Vasquez is a 62 y.o. year old male who sees Sharilyn Sites, MD for primary care. I spoke with  [name] by phone today.  What matters to the patients health and wellness today?  Managing neuropathy    Goals Addressed             This Visit's Progress    COMPLETED: Care Coordination Services       Care Coordination Interventions: Evaluation of current treatment plan related to dark spots on feet/ankles and patient's adherence to plan as established by provider Advised patient to purchase cushion boots/pads as instructed to alleviate pressure from feet Reviewed medications with patient and discussed affordability Reviewed scheduled/upcoming provider appointments including PCP visit on 10/25 Encouraged to discuss concern regarding heat in the home with CSW on Friday during telephone visit       Manage B12 Deficiency & Neuropathy       Care Coordination Interventions: Evaluation of current treatment plan related to b12 deficiency and neuropathy and patient's adherence to plan as established by provider Advised patient to provide appropriate vaccination information to provider or CM team member at next visit Reviewed medications with patient and discussed lyrica and b12 injections. Patient gives monthly self injections but is having trouble drawing up the medication from the vial due to nerve problems in right hand. Discussed Lyrica for neuropathy. Prescribed 50mg  TID. It does help with his leg pain and he is slowly increasing the doses to TID because it does cause some trouble with balance. He is tolerating it better now. Still encouraged to talk with PCP about balance issues and discussed fall precautions.   Discussed plans with patient for ongoing care management follow up and provided patient with direct contact information for care management team Advised patient  to discuss receiving monthly b12 injections in the office vs self administration or an OTC methylated B12 which may be better absorbed than an oral cyanocobalamin form with provider. He has not talked with them about this yet.  SDOH needs assessed. Assessed for additional care coordination and resource needs. None identified Therapeutic listening utilized regarding health related concerns and general daily life with MS Provided with RNCC contact number and encouraged to reach out as needed          SDOH assessments and interventions completed:  Yes  SDOH Interventions Today    Flowsheet Row Most Recent Value  SDOH Interventions   Food Insecurity Interventions Intervention Not Indicated  Housing Interventions Intervention Not Indicated  Transportation Interventions Intervention Not Indicated  Physical Activity Interventions Patient Refused        Care Coordination Interventions:  Yes, provided   Follow up plan: Follow up call scheduled for 12/10/2022    Encounter Outcome:  Pt. Visit Completed   Chong Sicilian, BSN, RN-BC RN Care Coordinator [place]: [phone] Main #: [phone]

## 2022-10-14 ENCOUNTER — Telehealth: Payer: Self-pay | Admitting: Diagnostic Neuroimaging

## 2022-10-14 NOTE — Telephone Encounter (Signed)
Patient has been referred back to [place] to take over his MS care from Dr. Merlene Laughter. He's seen Dr. Leta Baptist previously (last in 2015), but is requesting to see Dr. Felecia Shelling now. Would you both be ok with this?

## 2022-10-21 ENCOUNTER — Encounter (HOSPITAL_COMMUNITY): Payer: Self-pay | Admitting: Neurology

## 2022-11-15 NOTE — Progress Notes (Unsigned)
[facility]  PATIENT: Chad Vasquez DOB: Feb 25, 1961  REFERRING DOCTOR OR PCP: Sharilyn Sites, MD SOURCE: Patient, note from neurology, imaging and lab reports, MRI images personally reviewed.  _________________________________   HISTORICAL  CHIEF COMPLAINT:  Chief Complaint  Patient presents with   New Patient (Initial Visit)    Pt in room 11, new patient here for MS. Pt is electric wheel chair, right side arm and leg weakness. Pt reports last fall several weeks ago.     HISTORY OF PRESENT ILLNESS:  I had the pleasure of seeing your patient, Chad Vasquez, at the [place] [facility] for neurologic consultation regarding his multiple sclerosis.  He is a 62 year old man who was diagnosed with MS in 2010. He had the fairly sudden onset of left leg weakness and had a foot drop.   He was diagnosed with MS after MRI and LP..  he had increased IgG index nd> 5 OCB.   He had an AFO brace and was able to walk and do most things.   In 2013, he woke up with a high fever and he was very weak.   EMS took him to [facility].  A LP was attempted but caused a lot of pain and he is not sure if they obtained  He saw Dr. Erling Cruz initially, then Dr. Leta Baptist then Dr. Merlene Laughter more recently and needs to chance as he retired.    He had been on multiple different medications, initially copaxone (2013-2014) then Tysabri in 2015 (Felt poorly and joint pain). And then on Aubagio (2016, stopped due to liver issues), Vumerity (stopped for s.e.).     He also was on Tysabri at some point.  Ocrevus had been discussed but never started. Marland Kitchen  He has had multiple rounds of IV Solumedrol and feels better a few weeks after each one.     Currently, he is wheelchair bound but can stand up and transfer to chair, bed, tolilet.     Both legs are weak though right is mildly worse.   He takes baclofen for spasticity.  He takes tramadol for leg pain.   He has some dysesthesias in his  lower legs, right > left but this is usually mild.   He has urinary hesitancy.  He feels he empties.     He denies much urgency.   He has constipation.   He reports vision is fine with glasses  He has fatigue many days.  He tried to stay active and does most chores.  He even chops firewood.    He sleeps poorly some nights.  Mood is ok but sometimes gets aggravated easily (especially if ground is wet since he can't take wheelchair in yard for outdoor chores).   He denies any significant cognitive issue.      Imaging: MRI of the brain 07/06/2021 showed multiple T2/FLAIR hyperintense foci in the periventricular, juxtacortical and deep white matter of the cerebral hemispheres, a subtle focus is noted in the left cerebellum near the fourth ventricle.  Small focus in the pons.  None of the foci enhance.  However, compared to the MRI from 04/09/2014 there has been some progression with a couple new foci in the hemispheres and enlargement of other foci.  MRI of the cervical spine 07/06/2021 shows a T2 hyperintense focus within the spinal cord towards the right adjacent to C3-C4 measuring 15 mm in length.  There are degenerative changes at C3-C4 causing mild spinal stenosis and foraminal narrowing.  At C4-C5 there is left foraminal narrowing at C6-C7, there is bilateral foraminal narrowing.  The spinal cord is stable compared to the MRI from 2014   REVIEW OF SYSTEMS: Constitutional: No fevers, chills, sweats, or change in appetite.  He notes fatigue. Eyes: No visual changes, double vision, eye pain Ear, nose and throat: No hearing loss, ear pain, nasal congestion, sore throat Cardiovascular: No chest pain, palpitations Respiratory:  No shortness of breath at rest or with exertion.   No wheezes GastrointestinaI: No nausea, vomiting, diarrhea, abdominal pain, fecal incontinence Genitourinary: He has urinary hesitancy.   Musculoskeletal:  No neck pain, back pain Integumentary: No rash, pruritus, skin  lesions Neurological: as above Psychiatric: Mild depression.  No anxiety. Endocrine: No palpitations, diaphoresis, change in appetite, change in weigh or increased thirst Hematologic/Lymphatic:  No anemia, purpura, petechiae. Allergic/Immunologic: No itchy/runny eyes, nasal congestion, recent allergic reactions, rashes  ALLERGIES: No Known Allergies  HOME MEDICATIONS:  Current Outpatient Medications:    baclofen (LIORESAL) 20 MG tablet, Take 1 tablet (20 mg total) by mouth 3 (three) times daily., Disp: 90 each, Rfl: 12   Biotin 10000 MCG TABS, Take 50,000 mcg by mouth daily., Disp: , Rfl:    buPROPion (WELLBUTRIN XL) 150 MG 24 hr tablet, Take 150 mg by mouth daily., Disp: , Rfl:    calcium carbonate (TUMS - DOSED IN MG ELEMENTAL CALCIUM) 500 MG chewable tablet, Chew 1 tablet by mouth daily as needed for indigestion or heartburn. Reported on 02/03/2016, Disp: , Rfl:    diazepam (VALIUM) 10 MG tablet, Take 10 mg by mouth 4 (four) times daily., Disp: , Rfl:    docusate sodium (COLACE) 100 MG capsule, Take 200 mg by mouth 2 (two) times daily as needed for mild constipation. Reported on 02/03/2016, Disp: , Rfl:    losartan (COZAAR) 100 MG tablet, Take 50 mg by mouth every morning. , Disp: , Rfl:    Probiotic Product (PROBIOTIC DAILY PO), Take 1 capsule by mouth daily., Disp: , Rfl:    traMADol (ULTRAM) 50 MG tablet, Take 100 mg by mouth every 6 (six) hours as needed for moderate pain. , Disp: , Rfl:    Vitamin D, Ergocalciferol, (DRISDOL) 50000 UNITS CAPS capsule, Take 50,000 Units by mouth every 7 (seven) days. Takes on Wedneday, Disp: , Rfl:    cyanocobalamin (,VITAMIN B-12,) 1000 MCG/ML injection, Inject 1,000 mcg into the muscle every 30 (thirty) days. (Patient not taking: Reported on 11/16/2022), Disp: , Rfl:    PROAIR HFA 108 (29 BASE) MCG/ACT inhaler, INHALE TWO PUFFS INTO THE LUNGS EVERY SIX HOURS AS NEEDED FOR WHEEZING (Patient not taking: Reported on 07/06/2021), Disp: 8.5 g, Rfl: 3  PAST  MEDICAL HISTORY: Past Medical History:  Diagnosis Date   Anxiety and depression    Arthritis    Chronic pain    COPD (chronic obstructive pulmonary disease) ([place])    Coronary artery disease    GERD (gastroesophageal reflux disease)    Hiatal hernia    HTN (hypertension)    Hyperlipidemia    Multiple sclerosis ([place]) 2010   Polysubstance abuse ([place]) 2013   Sarcoidosis    2013, ???   Schatzki's ring    mild   Sleep apnea    CPAP    PAST SURGICAL HISTORY: Past Surgical History:  Procedure Laterality Date   CARDIAC CATHETERIZATION  2005   CHOLECYSTECTOMY N/A 04/16/2013   Procedure: LAPAROSCOPIC CHOLECYSTECTOMY;  Surgeon: Jamesetta So, MD;  Location: [facility]S;  Service: General;  Laterality:  N/A;   COLONOSCOPY  2011   Dr. Gala Romney: normal   ESOPHAGOGASTRODUODENOSCOPY (EGD) WITH PROPOFOL N/A 02/16/2016   Dr.Rourk- LA grade A esophagitis, small hiatal hernia, normal second portion of the duodenum, mild schatzki ring, no specimens collected.   MALONEY DILATION N/A 02/16/2016   Procedure: Venia Minks DILATION;  Surgeon: Daneil Dolin, MD;  Location: [facility] SUITE;  Service: Endoscopy;  Laterality: N/A;   MEDIASTINOSCOPY  05/15/2012   Procedure: MEDIASTINOSCOPY;  Surgeon: Melrose Nakayama, MD;  [facility]: [facility] OR;  Service: Thoracic;  Laterality: N/A;    FAMILY HISTORY: Family History  Problem Relation Age of Onset   Heart failure Mother    Stroke Mother    Heart attack Mother 1   Heart failure Father    Heart attack Father 59       CABG   Breast cancer Sister    Seizures Brother    Breast cancer Sister    Liver disease Neg Hx    Colon cancer Neg Hx     SOCIAL HISTORY: Social History   Socioeconomic History   Marital status: Single    Spouse name: Not on file   Number of children: 0   Years of education: 12   Highest education level: 12th grade  Occupational History   Occupation: Presenter, broadcasting: UNEMPLOYED    Comment: disabled   Tobacco Use   Smoking  status: Former    Packs/day: 1.00    Years: 30.00    Total pack years: 30.00    Types: Cigarettes    Quit date: 05/01/2012    Years since quitting: 10.5    Passive exposure: Past   Smokeless tobacco: Never   Tobacco comments:    Quit x 4 years  Vaping Use   Vaping Use: Never used  Substance and Sexual Activity   Alcohol use: No    Alcohol/week: 0.0 standard drinks of alcohol    Comment: quit 05/01/2012   Drug use: No    Types: Cocaine, Marijuana    Comment: quit 05/01/2012   Sexual activity: Not Currently    Partners: Female  Other Topics Concern   Not on file  Social History Narrative   Patient lives at home with aid.   Caffeine Use: 3 cups daily   Pt is left handed    Pt in electric wheelchair    Social Determinants of Health   Financial Resource Strain: Low Risk  (09/07/2022)   Overall Financial Resource Strain (CARDIA)    Difficulty of Paying Living Expenses: Not hard at all  Food Insecurity: No Food Insecurity (10/11/2022)   Hunger Vital Sign    Worried About Running Out of Food in the Last Year: Never true    Ran Out of Food in the Last Year: Never true  Transportation Needs: No Transportation Needs (10/11/2022)   PRAPARE - Hydrologist (Medical): No    Lack of Transportation (Non-Medical): No  Physical Activity: Inactive (10/11/2022)   Exercise Vital Sign    Days of Exercise per Week: 0 days    Minutes of Exercise per Session: 0 min  Stress: No Stress Concern Present (06/17/2022)   Holiday    Feeling of Stress : Only a little  Social Connections: Unknown (06/17/2022)   Social Connection and Isolation Panel [NHANES]    Frequency of Communication with Friends and Family: More than three times a week    Frequency  of Social Gatherings with Friends and Family: More than three times a week    Attends Religious Services: More than 4 times per year    Active Member of [employer] or  Organizations: No    Attends Archivist Meetings: Never    Marital Status: Not on file  Intimate Partner Violence: Not At Risk (06/17/2022)   Humiliation, Afraid, Rape, and Kick questionnaire    Fear of Current or Ex-Partner: No    Emotionally Abused: No    Physically Abused: No    Sexually Abused: No       PHYSICAL EXAM  Vitals:   11/16/22 1307  BP: 128/71  Pulse: (!) 51  Height: 6' 3"$  (1.905 m)    Body mass index is 31.25 kg/m.   General: The patient is well-developed and well-nourished and in no acute distress  HEENT:  Head is [place]/AT.  Sclera are anicteric.  Funduscopic exam shows normal optic discs and retinal vessels.  Neck: No carotid bruits are noted.  The neck is nontender.  Cardiovascular: The heart has a regular rate and rhythm with a normal S1 and S2. There were no murmurs, gallops or rubs.    Skin: Extremities are without rash or  edema.  Musculoskeletal:  Back is nontender  Neurologic Exam  Mental status: The patient is alert and oriented x 3 at the time of the examination. The patient has apparent normal recent and remote memory, with an apparently normal attention span and concentration ability.   Speech is normal.  Cranial nerves: Extraocular movements are full. Pupils are equal, round, and reactive to light and accomodation. Colors are symmetric.    Facial symmetry is present. There is good facial sensation to soft touch bilaterally.Facial strength is normal.  Trapezius and sternocleidomastoid strength is normal. No dysarthria is noted.  The tongue is midline, and the patient has symmetric elevation of the soft palate. No obvious hearing deficits are noted.  Motor:  Muscle bulk is normal.   Tone is increased in right leg.   5/5 in arms except 4+/5 deltoid, triceps and hand on the right, 3/5 left iliopsoas and 4- to 4 elsewhere in leg.  Strength is 2-/5 right iliopsoas and 2+ to 3/5 elsewhere in the right leg,    Reduced right RAM in hand.    Sensory: Sensory testing is intact to pinprick, soft touch and vibration sensation in arms but reduced in vibration in right leg  Coordination: Cerebellar testing reveals good finger-nose-finger and heel-to-shin bilaterally.  Gait and station: Wheelchair bound  Reflexes: Deep tendon reflexes are symmetric and 3 bilaterally.        DIAGNOSTIC DATA (LABS, IMAGING, TESTING) - I reviewed patient records, labs, notes, testing and imaging myself where available.  Lab Results  Component Value Date   WBC 12.9 (H) 07/10/2021   HGB 14.0 07/10/2021   HCT 42.3 07/10/2021   MCV 90.2 07/10/2021   PLT 205 07/10/2021      Component Value Date/Time   NA 141 07/10/2021 0622   K 3.7 07/10/2021 0622   CL 109 07/10/2021 0622   CO2 23 07/10/2021 0622   GLUCOSE 154 (H) 07/10/2021 0622   BUN 26 (H) 07/10/2021 0622   CREATININE 0.76 07/10/2021 0622   CALCIUM 9.1 07/10/2021 0622   PROT 6.7 07/10/2021 0622   ALBUMIN 3.7 07/10/2021 0622   AST 58 (H) 07/10/2021 0622   ALT 99 (H) 07/10/2021 0622   ALKPHOS 64 07/10/2021 0622   BILITOT 0.8 07/10/2021 0622  GFRNONAA >60 07/10/2021 0622   GFRAA >60 04/29/2017 1559   Lab Results  Component Value Date   CHOL 167 07/07/2021   HDL 51 07/07/2021   LDLCALC 103 (H) 07/07/2021   TRIG 66 07/07/2021   CHOLHDL 3.3 07/07/2021   Lab Results  Component Value Date   HGBA1C 5.4 07/07/2021   Lab Results  Component Value Date   VITAMINB12 1,155 (H) 07/07/2021   Lab Results  Component Value Date   TSH 0.144 (L) 07/07/2021       ASSESSMENT AND PLAN  MS (multiple sclerosis) ([place]) - Plan: Hepatitis B surface antigen, HIV Antibody (routine testing w rflx), QuantiFERON-TB Gold Plus, Varicella zoster antibody, IgG, Hepatitis B core antibody, total, Hepatitis B surface antibody,qualitative, Hepatitis C antibody, CBC with Differential/Platelet, Comprehensive metabolic panel  High risk medication use - Plan: Hepatitis B surface antigen, HIV Antibody  (routine testing w rflx), QuantiFERON-TB Gold Plus, Varicella zoster antibody, IgG, Hepatitis B core antibody, total, Hepatitis B surface antibody,qualitative, Hepatitis C antibody, CBC with Differential/Platelet, Comprehensive metabolic panel  Wheelchair dependence  Spasticity  Urinary hesitancy  Other fatigue   In summary, Chad Vasquez is a 62 year old man with a long history of multiple sclerosis who is currently not on a disease modifying therapy.  MRI between 2015 and 2022 had shown some progression with a couple new foci and enlargement of other foci.  Therefore, I would classify him as having an active/relapsing form of multiple sclerosis.  I discussed with him that I would recommend that we begin a disease modifying therapy in the hope that it would help to reduce further relapse, MRI change and progression.  He has had difficulty tolerating multiple medications in the past.  I discussed the pros and cons of Mavenclad as it would be an effective medication that is usually well-tolerated.  I also discussed the possible cancer risk.  He is potentially interested.  We will check necessary blood work and he signed a service request form.  He will think about this over the next week while we wait for the lab results.  If the urinary hesitancy worsens we will consider tamsulosin.  He will continue baclofen and diazepam for the spasticity and tramadol for pain.  He has some dysesthesias but they do not seem severe enough to add an additional treatment.  Fatigue is variable but if it becomes more of a problem we could consider a trial of amantadine or Provigil.  He will return to see me in 3 to 4 months or sooner if there are new or worsening neurologic symptoms.  Thank you for asking me to see Chad Vasquez.  Please let me know if I can be of further assistance with him or other patients in the future.   Saphyra Hutt A. Felecia Shelling, MD, [facility] [id number], [id number] PM Certified in Neurology, Clinical  Neurophysiology, Sleep Medicine and Neuroimaging  [facility] [address], [place], [place] [address] [phone]

## 2022-11-16 ENCOUNTER — Ambulatory Visit: Payer: Medicare Other | Admitting: Neurology

## 2022-11-16 ENCOUNTER — Encounter: Payer: Self-pay | Admitting: Neurology

## 2022-11-16 VITALS — BP 128/71 | HR 51 | Ht 75.0 in

## 2022-11-16 DIAGNOSIS — R5383 Other fatigue: Secondary | ICD-10-CM

## 2022-11-16 DIAGNOSIS — Z79899 Other long term (current) drug therapy: Secondary | ICD-10-CM

## 2022-11-16 DIAGNOSIS — R3911 Hesitancy of micturition: Secondary | ICD-10-CM

## 2022-11-16 DIAGNOSIS — G35 Multiple sclerosis: Secondary | ICD-10-CM

## 2022-11-16 DIAGNOSIS — R252 Cramp and spasm: Secondary | ICD-10-CM | POA: Diagnosis not present

## 2022-11-16 DIAGNOSIS — Z993 Dependence on wheelchair: Secondary | ICD-10-CM

## 2022-11-20 LAB — COMPREHENSIVE METABOLIC PANEL
ALT: 17 IU/L (ref 0–44)
AST: 21 IU/L (ref 0–40)
Albumin/Globulin Ratio: 2.5 — ABNORMAL HIGH (ref 1.2–2.2)
Albumin: 4.7 g/dL (ref 3.9–4.9)
Alkaline Phosphatase: 79 IU/L (ref 44–121)
BUN/Creatinine Ratio: 10 (ref 10–24)
BUN: 9 mg/dL (ref 8–27)
Bilirubin Total: 0.5 mg/dL (ref 0.0–1.2)
CO2: 25 mmol/L (ref 20–29)
Calcium: 9.5 mg/dL (ref 8.6–10.2)
Chloride: 103 mmol/L (ref 96–106)
Creatinine, Ser: 0.89 mg/dL (ref 0.76–1.27)
Globulin, Total: 1.9 g/dL (ref 1.5–4.5)
Glucose: 91 mg/dL (ref 70–99)
Potassium: 4.3 mmol/L (ref 3.5–5.2)
Sodium: 141 mmol/L (ref 134–144)
Total Protein: 6.6 g/dL (ref 6.0–8.5)
eGFR: 96 mL/min/{1.73_m2} (ref >59)

## 2022-11-20 LAB — CBC WITH DIFFERENTIAL/PLATELET
Basophils Absolute: 0 10*3/uL (ref 0.0–0.2)
Basos: 1 %
EOS (ABSOLUTE): 0.1 10*3/uL (ref 0.0–0.4)
Eos: 2 %
Hematocrit: 41.8 % (ref 37.5–51.0)
Hemoglobin: 14.2 g/dL (ref 13.0–17.7)
Immature Grans (Abs): 0 10*3/uL (ref 0.0–0.1)
Immature Granulocytes: 0 %
Lymphocytes Absolute: 1.6 10*3/uL (ref 0.7–3.1)
Lymphs: 28 %
MCH: 31.1 pg (ref 26.6–33.0)
MCHC: 34 g/dL (ref 31.5–35.7)
MCV: 92 fL (ref 79–97)
Monocytes Absolute: 0.5 10*3/uL (ref 0.1–0.9)
Monocytes: 9 %
Neutrophils Absolute: 3.5 10*3/uL (ref 1.4–7.0)
Neutrophils: 60 %
Platelets: 219 10*3/uL (ref 150–450)
RBC: 4.57 x10E6/uL (ref 4.14–5.80)
RDW: 12.5 % (ref 11.6–15.4)
WBC: 5.7 10*3/uL (ref 3.4–10.8)

## 2022-11-20 LAB — HEPATITIS C ANTIBODY: Hep C Virus Ab: NONREACTIVE

## 2022-11-20 LAB — QUANTIFERON-TB GOLD PLUS
QuantiFERON Mitogen Value: >10 IU/mL
QuantiFERON Nil Value: 0.01 IU/mL
QuantiFERON TB1 Ag Value: 0.06 IU/mL
QuantiFERON TB2 Ag Value: 0.02 IU/mL
QuantiFERON-TB Gold Plus: NEGATIVE

## 2022-11-20 LAB — HEPATITIS B CORE ANTIBODY, TOTAL: Hep B Core Total Ab: NEGATIVE

## 2022-11-20 LAB — HIV ANTIBODY (ROUTINE TESTING W REFLEX): HIV Screen 4th Generation wRfx: NONREACTIVE

## 2022-11-20 LAB — HEPATITIS B SURFACE ANTIBODY,QUALITATIVE: Hep B Surface Ab, Qual: NONREACTIVE

## 2022-11-20 LAB — VARICELLA ZOSTER ANTIBODY, IGG: Varicella zoster IgG: 2813 index (ref >165)

## 2022-11-20 LAB — HEPATITIS B SURFACE ANTIGEN: Hepatitis B Surface Ag: NEGATIVE

## 2022-11-22 ENCOUNTER — Telehealth: Payer: Self-pay | Admitting: Neurology

## 2022-11-22 ENCOUNTER — Telehealth: Payer: Self-pay | Admitting: *Deleted

## 2022-11-22 NOTE — Telephone Encounter (Signed)
Pt is asking for some time to think about starting [place].  Pt states he will think on this some more then call back re: what way he wants to go.

## 2022-11-22 NOTE — Telephone Encounter (Signed)
Faxed completed/signed Mavenclad start form to [place] at [phone]. Received fax confirmation.  Dose for Month 1: 2 tablets daily for days 1-5, Month 2: 2 tablets daily for days 1-4 and 1 tablet daily for day 5.

## 2022-11-23 ENCOUNTER — Telehealth: Payer: Self-pay

## 2022-11-23 NOTE — Telephone Encounter (Signed)
Called pt and left him a message to call back

## 2022-11-23 NOTE — Telephone Encounter (Signed)
It appears that patient is still considering [place]. When he confirms that he is ready to start, please let me know and I will complete the PA.  Thanks!

## 2022-11-29 ENCOUNTER — Other Ambulatory Visit: Payer: Self-pay | Admitting: Neurology

## 2022-11-29 NOTE — Telephone Encounter (Signed)
Pt is calling wanting to know if Dr. Felecia Shelling will take over of medication baclofen (LIORESAL) 20 MG tablet , diazepam (VALIUM) 10 MG tablet  and traMADol (ULTRAM) 50 MG tablet, Vitamin D, Ergocalciferol, (DRISDOL) 50000 UNITS CAPS capsule . Stated these are the medicatuin his old Neurologist prescribed him. Pt is requesting a call back.

## 2022-11-30 MED ORDER — BACLOFEN 20 MG PO TABS: 20.0000 mg | ORAL_TABLET | Freq: Three times a day (TID) | ORAL | 11 refills | Status: DC

## 2022-11-30 MED ORDER — VITAMIN D (ERGOCALCIFEROL) 1.25 MG (50000 UNIT) PO CAPS: 50000.0000 [IU] | ORAL_CAPSULE | ORAL | 1 refills | Status: DC

## 2022-11-30 NOTE — Telephone Encounter (Addendum)
Per drug registry, last refilled tramadol '50mg'$  11/26/22 #240 by Dr. Merlene Laughter and diazepam '10mg'$  11/04/22 #120 by Dr. Merlene Laughter.   E-scribed rx Vit D and baclofen to pharmacy. Need to discuss rx diazepam/tramadol with Dr. Felecia Shelling further

## 2022-11-30 NOTE — Telephone Encounter (Signed)
Dr. Felecia Shelling- how would you like to handle refill for diazapem/tramadol? He just got refill on tramadol 11/26/22 from Dr. Merlene Laughter office.

## 2022-12-01 ENCOUNTER — Encounter (HOSPITAL_COMMUNITY): Payer: Self-pay | Admitting: Neurology

## 2022-12-01 MED ORDER — DIAZEPAM 5 MG PO TABS: 5.0000 mg | ORAL_TABLET | Freq: Four times a day (QID) | ORAL | 5 refills | Status: DC

## 2022-12-01 NOTE — Addendum Note (Signed)
Addended by: Wyvonnia Lora on: 12/01/2022 01:38 PM   Modules accepted: Orders

## 2022-12-01 NOTE — Addendum Note (Signed)
Addended by: Wyvonnia Lora on: 12/01/2022 01:39 PM   Modules accepted: Orders

## 2022-12-01 NOTE — Addendum Note (Signed)
Addended by: Wyvonnia Lora on: 12/01/2022 02:30 PM   Modules accepted: Orders

## 2022-12-01 NOTE — Telephone Encounter (Signed)
Called pt. Relayed Dr. Garth Bigness message. He declined referral to pain management. He is going to d/c tramadol. Was not aware it was a controlled medication. He will take OTC meds instead.   Aware Dr. Felecia Shelling will only write diazepam '5mg'$  po QID and not '10mg'$ . He first mentioned doubling up but I advised that if he is found to do this, Dr. Felecia Shelling will no longer write prescription. If lower dose ineffective, he should let our office know to discuss other options with MD. Aware rx request will go to Dr. Jaynee Eagles since Dr. Felecia Shelling out of office. He verbalized understanding.

## 2022-12-01 NOTE — Telephone Encounter (Signed)
Pt is asking for a call from RN to discuss him wanting to use the 4 refills her has remaining on the diazepam .  Pt wants to finish those up before starting the revised amount that Dr Felecia Shelling wants him to start.

## 2022-12-01 NOTE — Telephone Encounter (Signed)
LVM for pt to call office

## 2022-12-02 ENCOUNTER — Ambulatory Visit: Payer: Self-pay | Admitting: *Deleted

## 2022-12-02 ENCOUNTER — Encounter: Payer: Self-pay | Admitting: *Deleted

## 2022-12-02 DIAGNOSIS — G8220 Paraplegia, unspecified: Secondary | ICD-10-CM

## 2022-12-02 DIAGNOSIS — G35 Multiple sclerosis: Secondary | ICD-10-CM

## 2022-12-02 DIAGNOSIS — R252 Cramp and spasm: Secondary | ICD-10-CM

## 2022-12-02 MED ORDER — DIAZEPAM 5 MG PO TABS: 5.0000 mg | ORAL_TABLET | Freq: Four times a day (QID) | ORAL | 5 refills | Status: DC

## 2022-12-02 NOTE — Patient Outreach (Signed)
Care Coordination   Initial Visit Note   12/02/2022  Name: Chad Vasquez MRN: [id number] DOB: 1960/11/15  Chad Vasquez is a 62 y.o. year old male who sees Chad Sites, MD for primary care. I spoke with Chad Vasquez by phone today.  What matters to the patients health and wellness today?   Provide Supportive Counseling Regarding Change in Pain Medication Regimen.   Goals Addressed               This Visit's Progress     Provide Supportive Counseling Regarding Change in Pain Medication Regimen. (pt-stated)   On track     Care Coordination Interventions:  Interventions Today    Flowsheet Row Most Recent Value  Chronic Disease   Chronic disease during today's visit Hypertension (HTN), Chronic Obstructive Pulmonary Disease (COPD), Other  [Multiple Sclerosis, Paraplegia, Spasticity, Polysubstance Abuse, Generalized Weakness & Chronic Pain.]  General Interventions   General Interventions Discussed/Reviewed General Interventions Discussed, General Interventions Reviewed, Annual Eye Exam, Durable Medical Equipment (DME), Health Screening, Community Resources, Doctor Visits, Communication with  Chad Vasquez Care Provider]  Doctor Visits Discussed/Reviewed Doctor Visits Discussed, Doctor Visits Reviewed, Annual Wellness Visits, PCP, Specialist  Health Screening Bone Density, Colonoscopy, Prostate  [Encouraged]  Durable Medical Equipment (DME) Bed side commode, BP Cuff, Environmental consultant, Radio broadcast assistant  PCP/Specialist Visits Compliance with follow-up visit  [Encouraged]  Communication with PCP/Specialists, Therapist, sports, Pharmacists  Exercise Interventions   Exercise Discussed/Reviewed Exercise Discussed, Exercise Reviewed, Physical Activity, Assistive device use and maintanence  [Encouraged]  Physical Activity Discussed/Reviewed Physical Activity Discussed, Physical Activity Reviewed, Types of exercise, Home Exercise Program (HEP)  [Encouraged]  Education Interventions   Education  Provided Provided Engineer, site, Provided Education  Provided Verbal Education On Nutrition, Mental Health/Coping with Illness, Exercise, Medication, When to see the doctor, [place] Discussed, Mental Health Reviewed, Coping Strategies, Crisis, Suicide, Substance Abuse, Grief and Loss, Depression, Anxiety  Nutrition Interventions   Nutrition Discussed/Reviewed Nutrition Discussed, Nutrition Reviewed, Carbohydrate meal planning, Fluid intake, Portion sizes, Decreasing sugar intake, Decreasing salt, Decreasing fats, Increaing proteins  Pharmacy Interventions   Pharmacy Dicussed/Reviewed Pharmacy Topics Discussed, Pharmacy Topics Reviewed, Medication Adherence, Affording Medications, Referral to Pharmacist  Medication Adherence Not taking medication, Unable to refill medication  Referral to Pharmacist Drug interaction/side effects, Cannot afford medications  Safety Interventions   Safety Discussed/Reviewed Safety Discussed, Safety Reviewed, Fall Risk  Advanced Directive Interventions   Advanced Directives Discussed/Reviewed Advanced Directives Discussed     Assessed Social Determinant of Health Barriers. Discussed Plans for Ongoing Care Management Follow Up. Provided Tree surgeon Information for Care Management Team Members. Screened for Signs & Symptoms of Depression, Related to Chronic Disease State. PHQ2 & PHQ9 Depression Screen Completed & Results Reviewed.  Suicidal Ideation & Homicidal Ideation Assessed - None Present.   Access to Weapons Assessed - None Present.   Domestic Violence Assessed - None Present. Crisis Information, Agencies, Services & Resources Reviewed. Active Listening & Reflection Utilized.  Verbalization of Feelings Encouraged.  Emotional Support Provided. Feelings of Frustration Validated. Caregiver Stress Acknowledged. Caregiver Resources  Discussed. Caregiver Support Groups Offered. Self-Enrollment in Caregiver Support Group of Interest Emphasized. Problem Solving Interventions Identified. Unwillingness to Attend Patient Management Clinic Verified. Task-Centered Solutions Implemented.  Solution-Focused Strategies Developed.  Brief Cognitive Behavioral Therapy Performed. Acceptance & Commitment Therapy Introduced. Client-Centered Therapy Initiated. Reviewed Prescription Medications & Discussed Importance of Compliance. Referral Placed to Pharmacist for Medication Management, Education,  Patient Assistance & Possible Polypharmacy. Quality of Sleep Assessed & Sleep Hygiene Techniques Promoted. Encouraged Referral to Psychiatrist for Psychotropic Medication Management. Encouraged Referral to Therapist for Psychotherapeutic Counseling Services.        SDOH assessments and interventions completed:  Yes.  SDOH Interventions Today    Flowsheet Row Most Recent Value  SDOH Interventions   Food Insecurity Interventions Intervention Not Indicated  Housing Interventions Intervention Not Indicated  Transportation Interventions Intervention Not Indicated, Patient Resources (Friends/Family), Payor Benefit  Utilities Interventions Intervention Not Indicated  Alcohol Usage Interventions Intervention Not Indicated (Score <7)  Financial Strain Interventions Intervention Not Indicated  Physical Activity Interventions Patient Refused  Stress Interventions Offered Community Wellness Resources, Provide Counseling     Care Coordination Interventions:  Yes, provided.   Follow up plan: Follow up call scheduled for 12/14/2022 at 11:45 am.  Encounter Outcome:  Pt. Visit Completed.   Chad Vasquez, BSW, MSW, LCSW  Licensed Education officer, [facility]  Mailing [place] [address], [place], [place] [address] Physical Address-[address], [place], [place] [address] Toll Free  Main # [phone] Fax # [phone] Cell # [phone] [email or web address]

## 2022-12-02 NOTE — Patient Instructions (Signed)
Visit Information  Thank you for taking time to visit with me today. Please don't hesitate to contact me if I can be of assistance to you.   Following are the goals we discussed today:   Goals Addressed               This Visit's Progress     Provide Supportive Counseling Regarding Change in Pain Medication Regimen. (pt-stated)   On track     Care Coordination Interventions:  Interventions Today    Flowsheet Row Most Recent Value  Chronic Disease   Chronic disease during today's visit Hypertension (HTN), Chronic Obstructive Pulmonary Disease (COPD), Other  [Multiple Sclerosis, Paraplegia, Spasticity, Polysubstance Abuse, Generalized Weakness & Chronic Pain.]  General Interventions   General Interventions Discussed/Reviewed General Interventions Discussed, General Interventions Reviewed, Annual Eye Exam, Durable Medical Equipment (DME), Health Screening, Community Resources, Doctor Visits, Communication with  [employer] Care Provider]  Doctor Visits Discussed/Reviewed Doctor Visits Discussed, Doctor Visits Reviewed, Annual Wellness Visits, PCP, Specialist  Health Screening Bone Density, Colonoscopy, Prostate  [Encouraged]  Durable Medical Equipment (DME) Bed side commode, BP Cuff, Environmental consultant, Radio broadcast assistant  PCP/Specialist Visits Compliance with follow-up visit  [Encouraged]  Communication with PCP/Specialists, Therapist, sports, Pharmacists  Exercise Interventions   Exercise Discussed/Reviewed Exercise Discussed, Exercise Reviewed, Physical Activity, Assistive device use and maintanence  [Encouraged]  Physical Activity Discussed/Reviewed Physical Activity Discussed, Physical Activity Reviewed, Types of exercise, Home Exercise Program (HEP)  [Encouraged]  Education Interventions   Education Provided Provided Engineer, site, Provided Education  Provided Verbal Education On Nutrition, Mental Health/Coping with Illness, Exercise, Medication, When to see the doctor, [place] Discussed, Mental Health Reviewed, Coping Strategies, Crisis, Suicide, Substance Abuse, Grief and Loss, Depression, Anxiety  Nutrition Interventions   Nutrition Discussed/Reviewed Nutrition Discussed, Nutrition Reviewed, Carbohydrate meal planning, Fluid intake, Portion sizes, Decreasing sugar intake, Decreasing salt, Decreasing fats, Increaing proteins  Pharmacy Interventions   Pharmacy Dicussed/Reviewed Pharmacy Topics Discussed, Pharmacy Topics Reviewed, Medication Adherence, Affording Medications, Referral to Pharmacist  Medication Adherence Not taking medication, Unable to refill medication  Referral to Pharmacist Drug interaction/side effects, Cannot afford medications  Safety Interventions   Safety Discussed/Reviewed Safety Discussed, Safety Reviewed, Fall Risk  Advanced Directive Interventions   Advanced Directives Discussed/Reviewed Advanced Directives Discussed     Assessed Social Determinant of Health Barriers. Discussed Plans for Ongoing Care Management Follow Up. Provided Tree surgeon Information for Care Management Team Members. Screened for Signs & Symptoms of Depression, Related to Chronic Disease State. PHQ2 & PHQ9 Depression Screen Completed & Results Reviewed.  Suicidal Ideation & Homicidal Ideation Assessed - None Present.   Access to Weapons Assessed - None Present.   Domestic Violence Assessed - None Present. Crisis Information, Agencies, Services & Resources Reviewed. Active Listening & Reflection Utilized.  Verbalization of Feelings Encouraged.  Emotional Support Provided. Feelings of Frustration Validated. Caregiver Stress Acknowledged. Caregiver Resources Discussed. Caregiver Support Groups Offered. Self-Enrollment in Caregiver Support Group of Interest Emphasized. Problem Solving Interventions Identified. Unwillingness to Attend Patient Management Clinic  Verified. Task-Centered Solutions Implemented.  Solution-Focused Strategies Developed.  Brief Cognitive Behavioral Therapy Performed. Acceptance & Commitment Therapy Introduced. Client-Centered Therapy Initiated. Reviewed Prescription Medications & Discussed Importance of Compliance. Referral Placed to Pharmacist for Medication Management, Education, Patient Assistance & Possible Polypharmacy. Quality of Sleep Assessed & Sleep Hygiene Techniques Promoted. Encouraged Referral to Psychiatrist for Psychotropic Medication Management. Encouraged Referral to Therapist for Psychotherapeutic Counseling Services.  Our next appointment is by telephone on 12/14/2022 at 11:45 am.  Please call the care guide team at [phone] if you need to cancel or reschedule your appointment.   If you are experiencing a Mental Health or [place] or need someone to talk to, please call the Suicide and Crisis Lifeline: 988 call the [place] National Suicide Prevention Lifeline: (872)280-1252 or TTY: 720-098-4921 TTY (205) 093-8749) to talk to a trained counselor call 1-800-273-TALK (toll free, 24 hour hotline) go to [facility] [address] [place] [phone]) call the [place]: [phone] call 911  Patient verbalizes understanding of instructions and care plan provided today and agrees to view in [place]. Active MyChart status and patient understanding of how to access instructions and care plan via MyChart confirmed with patient.     Telephone follow up appointment with care management team member scheduled for:  12/14/2022 at 11:45 am.  Nat Christen, BSW, MSW, [place]  Licensed Clinical Social Worker  [place]  Mailing [place]. [address] [place], [place] [address] Physical Address-[address] [place], [place] [address] Toll Free Main # [phone] Fax #  [phone] Cell # [phone] [email or web address]

## 2022-12-02 NOTE — Telephone Encounter (Addendum)
Phone room---please call pt back. I had extensive discussion with him yesterday that Dr. Felecia Shelling is not comfortable with him being on old dose prescribed by Dr. Loralie Champagne (diazepam '10mg'$  four times daily). Dr. Felecia Shelling wants him on '5mg'$  tablet four times daily. He will need to start on this dose since now under Dr. Garth Bigness care.   Rx didn't go through, sending to Dr. Krista Blue to e-scribe to pharmacy. Faxed printed/signed rx to pharmacy at [phone]. Received fax confirmation.  Dr. Krista Blue- please e-scribe diazepam

## 2022-12-03 ENCOUNTER — Ambulatory Visit: Payer: Self-pay | Admitting: *Deleted

## 2022-12-03 NOTE — Patient Outreach (Signed)
  Care Coordination   12/03/2022 Name: Chad Vasquez MRN: [id number] DOB: 1960-12-17   Care Coordination Outreach Attempts:  An unsuccessful telephone outreach was attempted for a scheduled appointment today.  Follow Up Plan:  Additional outreach attempts will be made to offer the patient care coordination information and services.   Encounter Outcome:  No Answer. Left HIPAA compliant VM.   Care Coordination Interventions:  No, not indicated    Reviewed LCSW notes from yesterday. Patient's primary concern at scheduling was that he was being referred to a pain management clinic. LCSW may have been able to provide patient with needed supportive care and resource information during telephone visit yesterday. Will have care guide reach out to reschedule telephone visit with me.  Chong Sicilian, BSN, RN-BC RN Care Coordinator [place] Direct Dial: [phone] Main #: [phone]

## 2022-12-08 NOTE — Telephone Encounter (Signed)
Took call from phone staff and spoke with [employer]/Addy (case manager for our office). Phone [phone] ext [phone]. Relayed pt is deciding on whether he wants to start therapy. Has not updated [place]. She will reach out to pt to get update and then will call us back with update.

## 2022-12-10 ENCOUNTER — Ambulatory Visit: Payer: Self-pay | Admitting: *Deleted

## 2022-12-10 NOTE — Patient Outreach (Signed)
  Care Coordination   12/10/2022 Name: Chad Vasquez MRN: [id number] DOB: 07-16-1961   Care Coordination Outreach Attempts:  An unsuccessful telephone outreach was attempted for a scheduled appointment today.  Follow Up Plan:  Additional outreach attempts will be made to offer the patient care coordination information and services.   Encounter Outcome:  No Answer. Left HIPAA compliant VM   Care Coordination Interventions:  Yes, provided   Interventions Today    Flowsheet Row Most Recent Value  Chronic Disease   Chronic disease during today's visit --  [Generalized Weakness and Chronic Pain]  General Interventions   General Interventions Discussed/Reviewed Doctor Visits, Communication with  Doctor Visits Discussed/Reviewed Doctor Visits Reviewed, PCP, Specialist  [reviewed LCSW note. Patient is being referred to pain management and has some concenrs. Left HIPAA compliant VM message reminding patient of f/u call with LCSW on 3/12.]  PCP/Specialist Visits Compliance with follow-up visit  Communication with Social Work  [employer] with Nat Christen, LCSW regarding patient's concerns and barriers re: pain management appointments]       Chong Sicilian, BSN, RN-BC RN Care Coordinator [place]: [phone] Main #: [phone]

## 2022-12-14 ENCOUNTER — Encounter: Payer: Self-pay | Admitting: *Deleted

## 2022-12-14 ENCOUNTER — Ambulatory Visit: Payer: Self-pay | Admitting: *Deleted

## 2022-12-14 NOTE — Patient Outreach (Signed)
Care Coordination   Follow Up Visit Note   12/14/2022  Name: Chad Vasquez MRN: [id number] DOB: 1960-11-11  Chad Vasquez is a 62 y.o. year old male who sees Sharilyn Sites, MD for primary care. I spoke with [name] by phone today.  What matters to the patients health and wellness today?  Provide Supportive Counseling Regarding Change in Pain Medication Regimen.    Goals Addressed               This Visit's Progress     Provide Supportive Counseling Regarding Change in Pain Medication Regimen. (pt-stated)   On track     Care Coordination Interventions:  Interventions Today    Flowsheet Row Most Recent Value  Chronic Disease   Chronic disease during today's visit Hypertension (HTN), Chronic Obstructive Pulmonary Disease (COPD), Other  [Multiple Sclerosis, Paraplegia, Spasticity, Polysubstance Abuse, Generalized Weakness & Chronic Pain.]  General Interventions   General Interventions Discussed/Reviewed General Interventions Discussed, General Interventions Reviewed, Annual Eye Exam, Durable Medical Equipment (DME), Health Screening, Community Resources, Doctor Visits, Communication with  [employer] Care Provider]  Doctor Visits Discussed/Reviewed Doctor Visits Discussed, Doctor Visits Reviewed, Annual Wellness Visits, PCP, Specialist  Health Screening Bone Density, Colonoscopy, Prostate  [Encouraged]  Durable Medical Equipment (DME) Bed side commode, BP Cuff, Environmental consultant, Radio broadcast assistant  PCP/Specialist Visits Compliance with follow-up visit  [Encouraged]  Communication with PCP/Specialists, Therapist, sports, Pharmacists  Exercise Interventions   Exercise Discussed/Reviewed Exercise Discussed, Exercise Reviewed, Physical Activity, Assistive device use and maintanence  [Encouraged]  Physical Activity Discussed/Reviewed Physical Activity Discussed, Physical Activity Reviewed, Types of exercise, Home Exercise Program (HEP)  [Encouraged]  Education Interventions   Education  Provided Provided Engineer, site, Provided Education  Provided Verbal Education On Nutrition, Mental Health/Coping with Illness, Exercise, Medication, When to see the doctor, [place] Discussed, Mental Health Reviewed, Coping Strategies, Crisis, Suicide, Substance Abuse, Grief and Loss, Depression, Anxiety  Nutrition Interventions   Nutrition Discussed/Reviewed Nutrition Discussed, Nutrition Reviewed, Carbohydrate meal planning, Fluid intake, Portion sizes, Decreasing sugar intake, Decreasing salt, Decreasing fats, Increaing proteins  Pharmacy Interventions   Pharmacy Dicussed/Reviewed Pharmacy Topics Discussed, Pharmacy Topics Reviewed, Medication Adherence, Affording Medications, Referral to Pharmacist  Medication Adherence Not taking medication, Unable to refill medication  Referral to Pharmacist Drug interaction/side effects, Cannot afford medications  Safety Interventions   Safety Discussed/Reviewed Safety Discussed, Safety Reviewed, Fall Risk  Advanced Directive Interventions   Advanced Directives Discussed/Reviewed Advanced Directives Discussed     Active Listening & Reflection Utilized.  Verbalization of Feelings Encouraged.  Emotional Support Provided. Feelings of Frustration Validated, Regarding Prescription Medication Changes. Caregiver Stress Acknowledged. Caregiver Resources Discussed. Caregiver Support Groups Offered. Self-Enrollment in Caregiver Support Group of Interest Emphasized. Problem Solving Interventions Activated. Unwillingness to Attend Patient Management Clinic Indicated. Task-Centered Solutions Implemented.  Solution-Focused Strategies Enacted.  Brief Cognitive Behavioral Therapy Performed. Acceptance & Commitment Therapy Indicated. Client-Centered Therapy Initiated. Encouraged Referral to Psychiatrist for Psychotropic Medication  Management. Encouraged Referral to Therapist for Psychotherapeutic Counseling Services. Benefits Discussed Regarding Management of Pain at Pain Management Clinic. Encouraged Chair Exercises to Increase Strength, Range of Motion, Conditioning, Mobility & Safety.      SDOH assessments and interventions completed:  Yes.  Care Coordination Interventions:  Yes, provided.   Follow up plan: Follow up call scheduled for 12/28/2022 at 11:45 am.  Encounter Outcome:  Pt. Visit Completed.   Nat Christen, [place], MSW, LCSW  Licensed Clinical  Social Warden/[facility]  Mailing Address-[address] [place], [place] [address] Physical Address-[address], [place], [place] [address] Toll Free Main # [phone] Fax # [phone] Cell # [phone] [email or web address]

## 2022-12-14 NOTE — Patient Instructions (Signed)
Visit Information  Thank you for taking time to visit with me today. Please don't hesitate to contact me if I can be of assistance to you.   Following are the goals we discussed today:   Goals Addressed               This Visit's Progress     Provide Supportive Counseling Regarding Change in Pain Medication Regimen. (pt-stated)   On track     Care Coordination Interventions:  Interventions Today    Flowsheet Row Most Recent Value  Chronic Disease   Chronic disease during today's visit Hypertension (HTN), Chronic Obstructive Pulmonary Disease (COPD), Other  [Multiple Sclerosis, Paraplegia, Spasticity, Polysubstance Abuse, Generalized Weakness & Chronic Pain.]  General Interventions   General Interventions Discussed/Reviewed General Interventions Discussed, General Interventions Reviewed, Annual Eye Exam, Durable Medical Equipment (DME), Health Screening, Community Resources, Doctor Visits, Communication with  [employer] Care Provider]  Doctor Visits Discussed/Reviewed Doctor Visits Discussed, Doctor Visits Reviewed, Annual Wellness Visits, PCP, Specialist  Health Screening Bone Density, Colonoscopy, Prostate  [Encouraged]  Durable Medical Equipment (DME) Bed side commode, BP Cuff, Environmental consultant, Radio broadcast assistant  PCP/Specialist Visits Compliance with follow-up visit  [Encouraged]  Communication with PCP/Specialists, Therapist, sports, Pharmacists  Exercise Interventions   Exercise Discussed/Reviewed Exercise Discussed, Exercise Reviewed, Physical Activity, Assistive device use and maintanence  [Encouraged]  Physical Activity Discussed/Reviewed Physical Activity Discussed, Physical Activity Reviewed, Types of exercise, Home Exercise Program (HEP)  [Encouraged]  Education Interventions   Education Provided Provided Engineer, site, Provided Education  Provided Verbal Education On Nutrition, Mental Health/Coping with Illness, Exercise, Medication, When to see the doctor, [place] Discussed, Mental Health Reviewed, Coping Strategies, Crisis, Suicide, Substance Abuse, Grief and Loss, Depression, Anxiety  Nutrition Interventions   Nutrition Discussed/Reviewed Nutrition Discussed, Nutrition Reviewed, Carbohydrate meal planning, Fluid intake, Portion sizes, Decreasing sugar intake, Decreasing salt, Decreasing fats, Increaing proteins  Pharmacy Interventions   Pharmacy Dicussed/Reviewed Pharmacy Topics Discussed, Pharmacy Topics Reviewed, Medication Adherence, Affording Medications, Referral to Pharmacist  Medication Adherence Not taking medication, Unable to refill medication  Referral to Pharmacist Drug interaction/side effects, Cannot afford medications  Safety Interventions   Safety Discussed/Reviewed Safety Discussed, Safety Reviewed, Fall Risk  Advanced Directive Interventions   Advanced Directives Discussed/Reviewed Advanced Directives Discussed     Active Listening & Reflection Utilized.  Verbalization of Feelings Encouraged.  Emotional Support Provided. Feelings of Frustration Validated, Regarding Prescription Medication Changes. Caregiver Stress Acknowledged. Caregiver Resources Discussed. Caregiver Support Groups Offered. Self-Enrollment in Caregiver Support Group of Interest Emphasized. Problem Solving Interventions Activated. Unwillingness to Attend Patient Management Clinic Indicated. Task-Centered Solutions Implemented.  Solution-Focused Strategies Enacted.  Brief Cognitive Behavioral Therapy Performed. Acceptance & Commitment Therapy Indicated. Client-Centered Therapy Initiated. Encouraged Referral to Psychiatrist for Psychotropic Medication Management. Encouraged Referral to Therapist for Psychotherapeutic Counseling Services. Benefits Discussed Regarding Management of Pain at Pain Management Clinic. Encouraged Chair Exercises to Increase Strength, Range  of Motion, Conditioning, Mobility & Safety.      Our next appointment is by telephone on 12/28/2022 at 11:45 am.  Please call the care guide team at [phone] if you need to cancel or reschedule your appointment.   If you are experiencing a Mental Health or [place] or need someone to talk to, please call the Suicide and Crisis Lifeline: 988 call the [place] National Suicide Prevention Lifeline: 804-380-7344 or TTY: (252) 127-7164 TTY (724) 312-0002) to talk to a trained counselor call 1-800-273-TALK (toll  free, 24 hour hotline) go to [facility] [address] [place] [phone]) call the [place]: [phone] call 911  Patient verbalizes understanding of instructions and care plan provided today and agrees to view in [place]. Active MyChart status and patient understanding of how to access instructions and care plan via MyChart confirmed with patient.     Telephone follow up appointment with care management team member scheduled for:  12/28/2022 at 11:45 am.  Nat Christen, BSW, MSW, [place]  Licensed Clinical Social Worker  [place]  Mailing [place]. [address], [place], [place] [address] Physical Address[address], [place], [place] [address] Toll Free Main # [phone] Fax # [phone] Cell # [phone] [email or web address]

## 2022-12-15 ENCOUNTER — Telehealth: Payer: Self-pay | Admitting: *Deleted

## 2022-12-15 ENCOUNTER — Telehealth: Payer: Self-pay | Admitting: Neurology

## 2022-12-15 NOTE — Telephone Encounter (Signed)
Pt has asked that a message be sent to Dr Felecia Shelling letter him know that there are a few concerns he would like to discuss with his PCP (this coming Friday) and a tooth he'd like to see his dentist about before making a decision about starting [place], pt states he does not want to rush into this decision .

## 2022-12-15 NOTE — Progress Notes (Signed)
  Care Coordination Note  12/15/2022 Name: Chad Vasquez MRN: [id number] DOB: Jun 29, 1961  Chad Vasquez is a 62 y.o. year old male who is a primary care patient of Sharilyn Sites, MD and is actively engaged with the care management team. I reached out to [name] by phone today to assist with re-scheduling a follow up visit with the RN Case Manager  Follow up plan: Unsuccessful telephone outreach attempt made. A HIPAA compliant phone message was left for the patient providing contact information and requesting a return call.   [place]  Direct Dial: [phone]

## 2022-12-16 NOTE — Progress Notes (Signed)
  Care Coordination Note  12/16/2022 Name: [name] MRN: [id number] DOB: September 25, 1961  Chad Vasquez is a 62 y.o. year old male who is a primary care patient of Sharilyn Sites, MD and is actively engaged with the care management team. I reached out to [name] by phone today to assist with re-scheduling a follow up visit with the RN Case Manager  Follow up plan: Unsuccessful telephone outreach attempt made. A HIPAA compliant phone message was left for the patient providing contact information and requesting a return call.   [place]  Direct Dial: [phone]

## 2022-12-17 NOTE — Progress Notes (Signed)
  Care Coordination Note  12/17/2022 Name: Chad Vasquez MRN: [id number] DOB: 07/30/1961  Chad Vasquez is a 62 y.o. year old male who is a primary care patient of Sharilyn Sites, MD and is actively engaged with the care management team. I reached out to [name] by phone today to assist with re-scheduling a follow up visit with the RN Case Manager  Follow up plan: Telephone appointment with care management team member scheduled for:12/24/22  [place]: [phone]

## 2022-12-24 ENCOUNTER — Ambulatory Visit: Payer: Self-pay | Admitting: *Deleted

## 2022-12-24 ENCOUNTER — Encounter: Payer: Self-pay | Admitting: *Deleted

## 2022-12-24 NOTE — Patient Outreach (Signed)
Care Coordination   Follow Up Visit Note   12/24/2022 Name: Chad Vasquez MRN: [id number] DOB: May 19, 1961  Chad Vasquez is a 62 y.o. year old male who sees Chad Sites, MD for primary care. I spoke with  Chad Vasquez by phone today.  What matters to the patients health and wellness today?  Managing multiple sclerosis, neuropathy, and b12 deficiency    Goals Addressed             This Visit's Progress    COMPLETED: Manage B12 Deficiency & Neuropathy       Care Coordination Interventions: Evaluation of current treatment plan related to b12 deficiency and neuropathy and patient's adherence to plan as established by provider Advised patient to provide appropriate vaccination information to provider or CM team member at next visit Reviewed medications with patient and discussed lyrica and b12 injections. Patient gives monthly self injections but is having trouble drawing up the medication from the vial due to nerve problems in right hand. Discussed Lyrica for neuropathy. Prescribed 50mg  TID. It does help with his leg pain and he is slowly increasing the doses to TID because it does cause some trouble with balance. He is tolerating it better now. Still encouraged to talk with PCP about balance issues and discussed fall precautions.   Discussed plans with patient for ongoing care management follow up and provided patient with direct contact information for care management team Advised patient to discuss receiving monthly b12 injections in the office vs self administration or an OTC methylated B12 which may be better absorbed than an oral cyanocobalamin form with provider. He has not talked with them about this yet.  SDOH needs assessed. Assessed for additional care coordination and resource needs. None identified Therapeutic listening utilized regarding health related concerns and general daily life with MS Provided with RNCC contact number and encouraged to reach out as needed        Manage Multiple Sclerosis       Care Coordination Goals: Patient will keep all medical appointments Patient will reach out to provider with any new or worsening symptoms Patient will continue to use assistive devices in order to continue to perform ADLs and IADLs Wheelchair, shower bench chair, grab bars around toilet Patient will reach out to Care Coordination team member with any resource or care coordination needs     Manage Neuropathy       Care Coordination Goals: Patient will continue to take Lyrica at bedtime and up to 3 times a day as prescribed Patient will reach out to provider with any new or worsening symptoms Patient will maintain b12 injections Patient will continue other mediations as prescribed Patient will keep f/u appointments with providers Patient will reach out to [place] Coordinator [phone] or other Care Coordination Team Member with any resource or care coordination needs         SDOH assessments and interventions completed:  Yes  SDOH Interventions Today    Flowsheet Row Most Recent Value  SDOH Interventions   Transportation Interventions Intervention Not Indicated  Financial Strain Interventions Intervention Not Indicated        Care Coordination Interventions:  Yes, provided  Interventions Today    Flowsheet Row Most Recent Value  Chronic Disease   Chronic disease during today's visit Other  [Multiple Sclerosis, Neuropathy, B12 Deficiency]  General Interventions   General Interventions Discussed/Reviewed General Interventions Discussed, General Interventions Reviewed, Labs, Doctor Visits, Durable Medical Equipment (DME)  Doctor Visits Discussed/Reviewed Doctor Visits Discussed,  Doctor Visits Reviewed, PCP, Specialist  Durable Medical Equipment (DME) Shower bench, Wheelchair  [Grab bars around toilet]  PCP/Specialist Visits Compliance with follow-up visit  Exercise Interventions   Exercise Discussed/Reviewed Physical Activity  Physical  Activity Discussed/Reviewed Physical Activity Reviewed, Physical Activity Discussed  Education Interventions   Education Provided Provided Education  Provided Verbal Education On Nutrition, Medication, When to see the doctor, [employer], Mental Health/Coping with Illness, [place] Discussed/Reviewed Other  [Follow-up with LCSW next week]  Nutrition Interventions   Nutrition Discussed/Reviewed Nutrition Discussed, Nutrition Reviewed  Pharmacy Interventions   Pharmacy Dicussed/Reviewed Medications and their functions  Safety Interventions   Safety Discussed/Reviewed Safety Discussed, Home Safety  Home Safety Assistive Devices       Follow up plan: Follow up call scheduled for 03/07/23    Encounter Outcome:  Pt. Visit Completed   Chong Sicilian, BSN, RN-BC RN Care Coordinator [place] Direct Dial: [phone] Main #: [phone]

## 2022-12-28 ENCOUNTER — Ambulatory Visit: Payer: Self-pay | Admitting: *Deleted

## 2022-12-28 ENCOUNTER — Encounter: Payer: Self-pay | Admitting: *Deleted

## 2022-12-28 NOTE — Patient Outreach (Signed)
Care Coordination   Follow Up Visit Note   12/28/2022  Name: Chad Vasquez MRN: [id number] DOB: July 02, 1961  Chad Vasquez is a 62 y.o. year old male who sees Sharilyn Sites, MD for primary care. I spoke with [name] by phone today.  What matters to the patients health and wellness today?  Provide Supportive Counseling Regarding Change in Pain Medication Regimen.   Goals Addressed               This Visit's Progress     Provide Supportive Counseling Regarding Change in Pain Medication Regimen. (pt-stated)   On track     Care Coordination Interventions:  Interventions Today    Flowsheet Row Most Recent Value  Chronic Disease   Chronic disease during today's visit Hypertension (HTN), Chronic Obstructive Pulmonary Disease (COPD), Other  [Multiple Sclerosis, Paraplegia, Spasticity, Polysubstance Abuse, Generalized Weakness & Chronic Pain.]  General Interventions   General Interventions Discussed/Reviewed General Interventions Discussed, General Interventions Reviewed, Annual Eye Exam, Durable Medical Equipment (DME), Health Screening, Community Resources, Doctor Visits, Communication with  [employer] Care Provider]  Doctor Visits Discussed/Reviewed Doctor Visits Discussed, Doctor Visits Reviewed, Annual Wellness Visits, PCP, Specialist  Health Screening Bone Density, Colonoscopy, Prostate  [Encouraged]  Durable Medical Equipment (DME) Bed side commode, BP Cuff, Environmental consultant, Radio broadcast assistant  PCP/Specialist Visits Compliance with follow-up visit  [Encouraged]  Communication with PCP/Specialists, Therapist, sports, Pharmacists  Exercise Interventions   Exercise Discussed/Reviewed Exercise Discussed, Exercise Reviewed, Physical Activity, Assistive device use and maintanence  [Encouraged]  Physical Activity Discussed/Reviewed Physical Activity Discussed, Physical Activity Reviewed, Types of exercise, Home Exercise Program (HEP)  [Encouraged]  Education Interventions   Education  Provided Provided Engineer, site, Provided Education  Provided Verbal Education On Nutrition, Mental Health/Coping with Illness, Exercise, Medication, When to see the doctor, [place] Discussed, Mental Health Reviewed, Coping Strategies, Crisis, Suicide, Substance Abuse, Grief and Loss, Depression, Anxiety  Nutrition Interventions   Nutrition Discussed/Reviewed Nutrition Discussed, Nutrition Reviewed, Carbohydrate meal planning, Fluid intake, Portion sizes, Decreasing sugar intake, Decreasing salt, Decreasing fats, Increaing proteins  Pharmacy Interventions   Pharmacy Dicussed/Reviewed Pharmacy Topics Discussed, Pharmacy Topics Reviewed, Medication Adherence, Affording Medications, Referral to Pharmacist  Medication Adherence Not taking medication, Unable to refill medication  Referral to Pharmacist Drug interaction/side effects, Cannot afford medications  Safety Interventions   Safety Discussed/Reviewed Safety Discussed, Safety Reviewed, Fall Risk  Advanced Directive Interventions   Advanced Directives Discussed/Reviewed Advanced Directives Discussed     Active Listening & Reflection Utilized.  Verbalization of Feelings Encouraged.  Emotional Support Provided. Feelings of Frustration Validated, Regarding Loss of Independence. Caregiver Stress Acknowledged. Caregiver Resources Discussed. Caregiver Support Groups Offered. Self-Enrollment in Caregiver Support Group of Interest Emphasized. Problem Solving Interventions Activated. Task-Centered Solutions Implemented.  Solution-Focused Strategies Employed.  Cognitive Behavioral Therapy Initiated. Acceptance & Commitment Therapy Performed. Client-Centered Therapy Indicated. Reviewed Psychotropic Medications & Discussed Importance of Compliance. Encouraged Referral to Psychiatrist for Psychotropic Medication Management, from List  Provided. Encouraged Referral to Therapist for Psychotherapeutic Counseling Services, from List Provided. Deep Breathing Exercises, Relaxation Techniques & Mindfulness Meditation Strategies Reviewed & Encouraged Daily. Encouraged Increase Level of Activity & Exercise & Reviewed Safe Chair Exercises. Please Contact CSW Directly (# [phone]), if You Have Questions, Need Assistance, or If Additional Social Work Needs Are Identified Between Now & Our Next Scheduled Telephone Follow-Up Outreach Call.      SDOH assessments and interventions completed:  Yes.  Care Coordination Interventions:  Yes, provided.   Follow up plan: Follow up call scheduled for 01/12/2023 at 10:45 am.  Encounter Outcome:  Pt. Visit Completed.   Nat Christen, BSW, MSW, LCSW  Licensed Education officer, environmental [facility]  Mailing [place] [address]. [address] [place], [place] [address] Physical Address[address] [place],  [address] Toll Free Main # [phone] Fax # [phone] Cell # [phone] [email or web address]

## 2022-12-28 NOTE — Patient Instructions (Signed)
Visit Information  Thank you for taking time to visit with me today. Please don't hesitate to contact me if I can be of assistance to you.   Following are the goals we discussed today:   Goals Addressed               This Visit's Progress     Provide Supportive Counseling Regarding Change in Pain Medication Regimen. (pt-stated)   On track     Care Coordination Interventions:  Interventions Today    Flowsheet Row Most Recent Value  Chronic Disease   Chronic disease during today's visit Hypertension (HTN), Chronic Obstructive Pulmonary Disease (COPD), Other  [Multiple Sclerosis, Paraplegia, Spasticity, Polysubstance Abuse, Generalized Weakness & Chronic Pain.]  General Interventions   General Interventions Discussed/Reviewed General Interventions Discussed, General Interventions Reviewed, Annual Eye Exam, Durable Medical Equipment (DME), Health Screening, Community Resources, Doctor Visits, Communication with  [employer] Care Provider]  Doctor Visits Discussed/Reviewed Doctor Visits Discussed, Doctor Visits Reviewed, Annual Wellness Visits, PCP, Specialist  Health Screening Bone Density, Colonoscopy, Prostate  [Encouraged]  Durable Medical Equipment (DME) Bed side commode, BP Cuff, Environmental consultant, Radio broadcast assistant  PCP/Specialist Visits Compliance with follow-up visit  [Encouraged]  Communication with PCP/Specialists, Therapist, sports, Pharmacists  Exercise Interventions   Exercise Discussed/Reviewed Exercise Discussed, Exercise Reviewed, Physical Activity, Assistive device use and maintanence  [Encouraged]  Physical Activity Discussed/Reviewed Physical Activity Discussed, Physical Activity Reviewed, Types of exercise, Home Exercise Program (HEP)  [Encouraged]  Education Interventions   Education Provided Provided Engineer, site, Provided Education  Provided Verbal Education On Nutrition, Mental Health/Coping with Illness, Exercise, Medication, When to see the doctor, [place] Discussed, Mental Health Reviewed, Coping Strategies, Crisis, Suicide, Substance Abuse, Grief and Loss, Depression, Anxiety  Nutrition Interventions   Nutrition Discussed/Reviewed Nutrition Discussed, Nutrition Reviewed, Carbohydrate meal planning, Fluid intake, Portion sizes, Decreasing sugar intake, Decreasing salt, Decreasing fats, Increaing proteins  Pharmacy Interventions   Pharmacy Dicussed/Reviewed Pharmacy Topics Discussed, Pharmacy Topics Reviewed, Medication Adherence, Affording Medications, Referral to Pharmacist  Medication Adherence Not taking medication, Unable to refill medication  Referral to Pharmacist Drug interaction/side effects, Cannot afford medications  Safety Interventions   Safety Discussed/Reviewed Safety Discussed, Safety Reviewed, Fall Risk  Advanced Directive Interventions   Advanced Directives Discussed/Reviewed Advanced Directives Discussed     Active Listening & Reflection Utilized.  Verbalization of Feelings Encouraged.  Emotional Support Provided. Feelings of Frustration Validated, Regarding Loss of Independence. Caregiver Stress Acknowledged. Caregiver Resources Discussed. Caregiver Support Groups Offered. Self-Enrollment in Caregiver Support Group of Interest Emphasized. Problem Solving Interventions Activated. Task-Centered Solutions Implemented.  Solution-Focused Strategies Employed.  Cognitive Behavioral Therapy Initiated. Acceptance & Commitment Therapy Performed. Client-Centered Therapy Indicated. Reviewed Psychotropic Medications & Discussed Importance of Compliance. Encouraged Referral to Psychiatrist for Psychotropic Medication Management, from List Provided. Encouraged Referral to Therapist for Psychotherapeutic Counseling Services, from List Provided. Deep Breathing Exercises, Relaxation Techniques & Mindfulness Meditation Strategies Reviewed &  Encouraged Daily. Encouraged Increase Level of Activity & Exercise & Reviewed Safe Chair Exercises. Please Contact CSW Directly (# [phone]), if You Have Questions, Need Assistance, or If Additional Social Work Needs Are Identified Between Now & Our Next Scheduled Telephone Follow-Up Outreach Call.      Our next appointment is by telephone on 01/12/2023 at 10:45 am.  Please call the care guide team at [phone] if you need to cancel or reschedule your appointment.   If you are experiencing a Mental Health or Behavioral  Health Crisis or need someone to talk to, please call the Suicide and Crisis Lifeline: 988 call the [place] National Suicide Prevention Lifeline: 2090370041 or TTY: (857)062-9560 TTY 2565919865) to talk to a trained counselor call 1-800-273-TALK (toll free, 24 hour hotline) go to [facility] [address] [place] [phone]) call the [place]: [phone] call 911  Patient verbalizes understanding of instructions and care plan provided today and agrees to view in [place]. Active MyChart status and patient understanding of how to access instructions and care plan via MyChart confirmed with patient.     Telephone follow up appointment with care management team member scheduled for:  01/12/2023 at 10:45 am.   Nat Christen, BSW, MSW, [place]  Licensed Clinical Social Worker  [place]  Mailing [place]. [address], [place], [place] [address] Physical Address-[address] [place], [place] [address] Toll Free Main # [phone] Fax # [phone] Cell # [phone] [email or web address]

## 2023-01-02 DIAGNOSIS — E785 Hyperlipidemia, unspecified: Secondary | ICD-10-CM | POA: Diagnosis not present

## 2023-01-02 DIAGNOSIS — E039 Hypothyroidism, unspecified: Secondary | ICD-10-CM | POA: Diagnosis not present

## 2023-01-02 DIAGNOSIS — J449 Chronic obstructive pulmonary disease, unspecified: Secondary | ICD-10-CM | POA: Diagnosis not present

## 2023-01-12 ENCOUNTER — Encounter: Payer: Self-pay | Admitting: *Deleted

## 2023-01-12 ENCOUNTER — Ambulatory Visit: Payer: Self-pay | Admitting: *Deleted

## 2023-01-12 NOTE — Patient Outreach (Signed)
Care Coordination   Follow Up Visit Note   01/12/2023  Name: Chad Vasquez MRN: [id number] DOB: 1961-06-12  Chad Vasquez is a 62 y.o. year old male who sees Chad Found, MD for primary care. I spoke with Chad Vasquez by phone today.  What matters to the patients health and wellness today?  Provide Supportive Counseling Regarding Change in Pain Medication Regimen.   Goals Addressed               This Visit's Progress     Provide Supportive Counseling Regarding Change in Pain Medication Regimen. (pt-stated)   On track     Care Coordination Interventions:  Interventions Today    Flowsheet Row Most Recent Value  Chronic Disease   Chronic disease during today's visit Hypertension (HTN), Chronic Obstructive Pulmonary Disease (COPD), Other  [Multiple Sclerosis, Paraplegia, Spasticity, Polysubstance Abuse, Generalized Weakness & Chronic Pain.]  General Interventions   General Interventions Discussed/Reviewed General Interventions Discussed, General Interventions Reviewed, Annual Eye Exam, Durable Medical Equipment (DME), Health Screening, Community Resources, Doctor Visits, Communication with  [facility] Care Provider]  Doctor Visits Discussed/Reviewed Doctor Visits Discussed, Doctor Visits Reviewed, Annual Wellness Visits, PCP, Specialist  Health Screening Bone Density, Colonoscopy, Prostate  [Encouraged]  Durable Medical Equipment (DME) Bed side commode, BP Cuff, Environmental consultant, Ecologist  PCP/Specialist Visits Compliance with follow-up visit  [Encouraged]  Communication with PCP/Specialists, Charity fundraiser, Pharmacists  Exercise Interventions   Exercise Discussed/Reviewed Exercise Discussed, Exercise Reviewed, Physical Activity, Assistive device use and maintanence  [Encouraged]  Physical Activity Discussed/Reviewed Physical Activity Discussed, Physical Activity Reviewed, Types of exercise, Home Exercise Program (HEP)  [Encouraged]  Education Interventions   Education  Provided Provided Therapist, sports, Provided Education  Provided Verbal Education On Nutrition, Mental Health/Coping with Illness, Exercise, Medication, When to see the doctor, [employer], Development worker, community  Mental Health Interventions   Mental Health Discussed/Reviewed Mental Health Discussed, Mental Health Reviewed, Coping Strategies, Crisis, Suicide, Substance Abuse, Grief and Loss, Depression, Anxiety  Nutrition Interventions   Nutrition Discussed/Reviewed Nutrition Discussed, Nutrition Reviewed, Carbohydrate meal planning, Fluid intake, Portion sizes, Decreasing sugar intake, Decreasing salt, Decreasing fats, Increaing proteins  Pharmacy Interventions   Pharmacy Dicussed/Reviewed Pharmacy Topics Discussed, Pharmacy Topics Reviewed, Medication Adherence, Affording Medications, Referral to Pharmacist  Medication Adherence Not taking medication, Unable to refill medication  Referral to Pharmacist Drug interaction/side effects, Cannot afford medications  Safety Interventions   Safety Discussed/Reviewed Safety Discussed, Safety Reviewed, Fall Risk  Advanced Directive Interventions   Advanced Directives Discussed/Reviewed Advanced Directives Discussed     Active Listening & Reflection Utilized.  Verbalization of Feelings Encouraged.  Emotional Support Provided. Feelings of Frustration Validated. Caregiver Stress Acknowledged. Caregiver Resources Reviewed. Caregiver Support Groups Revisted. Self-Enrollment in Caregiver Support Group of Interest Emphasized. Problem Solving Interventions Indicated. Task-Centered Solutions Activated.  Solution-Focused Strategies Employed.  Cognitive Behavioral Therapy Initiated. Acceptance & Commitment Therapy Performed. Client-Centered Therapy Implemented. Deep Breathing Exercises, Relaxation Techniques & Mindfulness Meditation Strategies Encouraged Daily. Please Review the Following List of [employer], [employer], Mailed on  01/12/2023: ~ [employer] in [place] ~ Home Cleaning & Decluttering Services CSW Collaboration with Receptionist at [employer] [phone]), Via 3-Way Call, to Assist with Scheduling Follow-Up Appointment with Primary Care Provider, Dr. Assunta Vasquez, to Address Right Hip & Knee Pain. Please Keep Follow-Up Appointment with Primary Care Provider, Dr. Assunta Vasquez with [facility] [phone]), Scheduled on 01/19/2023 at 1:45 PM. Please Contact CSW Directly (# [phone]), if You Have  Questions, Need Assistance, or If Additional Social Work Needs Are Identified Between Now & Our Next Scheduled Telephone Follow-Up Outreach Call.      SDOH assessments and interventions completed:  Yes.  Care Coordination Interventions:  Yes, provided.   Follow up plan: Follow up call scheduled for 01/25/2023 at 1:00 pm.   Encounter Outcome:  Pt. Visit Completed.   [name], BSW, MSW, LCSW  Licensed Restaurant manager, fast food [facility]  Mailing [place] [address] [place], [place] [address] Physical Address[address], [place], [place] [address] Toll Free Main # [phone] Fax # [phone] Cell # [phone] [email or web address]

## 2023-01-12 NOTE — Patient Instructions (Signed)
Visit Information  Thank you for taking time to visit with me today. Please don't hesitate to contact me if I can be of assistance to you.   Following are the goals we discussed today:   Goals Addressed               This Visit's Progress     Provide Supportive Counseling Regarding Change in Pain Medication Regimen. (pt-stated)   On track     Care Coordination Interventions:  Interventions Today    Flowsheet Row Most Recent Value  Chronic Disease   Chronic disease during today's visit Hypertension (HTN), Chronic Obstructive Pulmonary Disease (COPD), Other  [Multiple Sclerosis, Paraplegia, Spasticity, Polysubstance Abuse, Generalized Weakness & Chronic Pain.]  General Interventions   General Interventions Discussed/Reviewed General Interventions Discussed, General Interventions Reviewed, Annual Eye Exam, Durable Medical Equipment (DME), Health Screening, Community Resources, Doctor Visits, Communication with  [facility] Care Provider]  Doctor Visits Discussed/Reviewed Doctor Visits Discussed, Doctor Visits Reviewed, Annual Wellness Visits, PCP, Specialist  Health Screening Bone Density, Colonoscopy, Prostate  [Encouraged]  Durable Medical Equipment (DME) Bed side commode, BP Cuff, Environmental consultant, Ecologist  PCP/Specialist Visits Compliance with follow-up visit  [Encouraged]  Communication with PCP/Specialists, Charity fundraiser, Pharmacists  Exercise Interventions   Exercise Discussed/Reviewed Exercise Discussed, Exercise Reviewed, Physical Activity, Assistive device use and maintanence  [Encouraged]  Physical Activity Discussed/Reviewed Physical Activity Discussed, Physical Activity Reviewed, Types of exercise, Home Exercise Program (HEP)  [Encouraged]  Education Interventions   Education Provided Provided Therapist, sports, Provided Education  Provided Verbal Education On Nutrition, Mental Health/Coping with Illness, Exercise, Medication, When to see the doctor, [employer],  Development worker, community  Mental Health Interventions   Mental Health Discussed/Reviewed Mental Health Discussed, Mental Health Reviewed, Coping Strategies, Crisis, Suicide, Substance Abuse, Grief and Loss, Depression, Anxiety  Nutrition Interventions   Nutrition Discussed/Reviewed Nutrition Discussed, Nutrition Reviewed, Carbohydrate meal planning, Fluid intake, Portion sizes, Decreasing sugar intake, Decreasing salt, Decreasing fats, Increaing proteins  Pharmacy Interventions   Pharmacy Dicussed/Reviewed Pharmacy Topics Discussed, Pharmacy Topics Reviewed, Medication Adherence, Affording Medications, Referral to Pharmacist  Medication Adherence Not taking medication, Unable to refill medication  Referral to Pharmacist Drug interaction/side effects, Cannot afford medications  Safety Interventions   Safety Discussed/Reviewed Safety Discussed, Safety Reviewed, Fall Risk  Advanced Directive Interventions   Advanced Directives Discussed/Reviewed Advanced Directives Discussed     Active Listening & Reflection Utilized.  Verbalization of Feelings Encouraged.  Emotional Support Provided. Feelings of Frustration Validated. Caregiver Stress Acknowledged. Caregiver Resources Reviewed. Caregiver Support Groups Revisted. Self-Enrollment in Caregiver Support Group of Interest Emphasized. Problem Solving Interventions Indicated. Task-Centered Solutions Activated.  Solution-Focused Strategies Employed.  Cognitive Behavioral Therapy Initiated. Acceptance & Commitment Therapy Performed. Client-Centered Therapy Implemented. Deep Breathing Exercises, Relaxation Techniques & Mindfulness Meditation Strategies Encouraged Daily. Please Review the Following List of [employer], [employer], Mailed on 01/12/2023: ~ [employer] in [place] ~ Home Cleaning & Decluttering Services CSW Collaboration with Receptionist at [employer] [phone]), Via 3-Way Call, to Assist  with Scheduling Follow-Up Appointment with Primary Care Provider, Dr. Assunta Found, to Address Right Hip & Knee Pain. Please Keep Follow-Up Appointment with Primary Care Provider, Dr. Assunta Found with [facility] [phone]), Scheduled on 01/19/2023 at 1:45 PM. Please Contact CSW Directly (# [phone]), if You Have Questions, Need Assistance, or If Additional Social Work Needs Are Identified Between Now & Our Next Scheduled Telephone Follow-Up Outreach Call.      Our next appointment is by telephone  on 01/25/2023 at 1:00 pm.   Please call the care guide team at [phone] if you need to cancel or reschedule your appointment.   If you are experiencing a Mental Health or Behavioral Health Crisis or need someone to talk to, please call the Suicide and Crisis Lifeline: 988 call the [place] National Suicide Prevention Lifeline: 629-243-0631 or TTY: (938) 167-5965 TTY (518) 242-2192) to talk to a trained counselor call 1-800-273-TALK (toll free, 24 hour hotline) go to [facility] [address] [place] [phone]) call the [facility] Crisis Line: [phone] call 911  Patient verbalizes understanding of instructions and care plan provided today and agrees to view in MyChart. Active MyChart status and patient understanding of how to access instructions and care plan via MyChart confirmed with patient.     Telephone follow up appointment with care management team member scheduled for:  01/25/2023 at 1:00 pm.   [name], BSW, MSW, LCSW  Licensed Clinical Social Worker  [facility]  Mailing [place]. [address] [place], [place] [address] Physical Address-[address] [place], [place] [address] Toll Free Main # [phone] Fax # [phone] Cell # [phone] [email or web address]

## 2023-01-19 ENCOUNTER — Ambulatory Visit (HOSPITAL_COMMUNITY)
Admission: RE | Admit: 2023-01-19 | Discharge: 2023-01-19 | Disposition: A | Discharge status: Home / Self Care | Payer: Medicare Other | Source: Ambulatory Visit | Attending: Neurology | Admitting: Neurology

## 2023-01-19 DIAGNOSIS — G35 Multiple sclerosis: Secondary | ICD-10-CM | POA: Diagnosis not present

## 2023-01-19 DIAGNOSIS — M25551 Pain in right hip: Secondary | ICD-10-CM | POA: Diagnosis not present

## 2023-01-19 DIAGNOSIS — Z6834 Body mass index (BMI) 34.0-34.9, adult: Secondary | ICD-10-CM | POA: Diagnosis not present

## 2023-01-19 DIAGNOSIS — E6609 Other obesity due to excess calories: Secondary | ICD-10-CM | POA: Diagnosis not present

## 2023-01-19 DIAGNOSIS — M25561 Pain in right knee: Secondary | ICD-10-CM | POA: Insufficient documentation

## 2023-01-19 DIAGNOSIS — W19XXXA Unspecified fall, initial encounter: Secondary | ICD-10-CM | POA: Diagnosis not present

## 2023-01-25 ENCOUNTER — Encounter: Payer: Self-pay | Admitting: *Deleted

## 2023-01-25 ENCOUNTER — Ambulatory Visit: Payer: Self-pay | Admitting: *Deleted

## 2023-01-25 NOTE — Patient Instructions (Signed)
Visit Information  Thank you for taking time to visit with me today. Please don't hesitate to contact me if I can be of assistance to you.   Following are the goals we discussed today:   Goals Addressed               This Visit's Progress     Provide Supportive Counseling Regarding Change in Pain Medication Regimen. (pt-stated)   On track     Care Coordination Interventions:  Interventions Today    Flowsheet Row Most Recent Value  Chronic Disease   Chronic disease during today's visit Hypertension (HTN), Chronic Obstructive Pulmonary Disease (COPD), Other  [Multiple Sclerosis, Paraplegia, Spasticity, Polysubstance Abuse, Generalized Weakness & Chronic Pain.]  General Interventions   General Interventions Discussed/Reviewed General Interventions Discussed, General Interventions Reviewed, Annual Eye Exam, Durable Medical Equipment (DME), Health Screening, Community Resources, Doctor Visits, Communication with  [facility] Care Provider]  Doctor Visits Discussed/Reviewed Doctor Visits Discussed, Doctor Visits Reviewed, Annual Wellness Visits, PCP, Specialist  Health Screening Bone Density, Colonoscopy, Prostate  [Encouraged]  Durable Medical Equipment (DME) Bed side commode, BP Cuff, Environmental consultant, Ecologist  PCP/Specialist Visits Compliance with follow-up visit  [Encouraged]  Communication with PCP/Specialists, Charity fundraiser, Pharmacists  Exercise Interventions   Exercise Discussed/Reviewed Exercise Discussed, Exercise Reviewed, Physical Activity, Assistive device use and maintanence  [Encouraged]  Physical Activity Discussed/Reviewed Physical Activity Discussed, Physical Activity Reviewed, Types of exercise, Home Exercise Program (HEP)  [Encouraged]  Education Interventions   Education Provided Provided Therapist, sports, Provided Education  Provided Verbal Education On Nutrition, Mental Health/Coping with Illness, Exercise, Medication, When to see the doctor, [employer],  Development worker, community  Mental Health Interventions   Mental Health Discussed/Reviewed Mental Health Discussed, Mental Health Reviewed, Coping Strategies, Crisis, Suicide, Substance Abuse, Grief and Loss, Depression, Anxiety  Nutrition Interventions   Nutrition Discussed/Reviewed Nutrition Discussed, Nutrition Reviewed, Carbohydrate meal planning, Fluid intake, Portion sizes, Decreasing sugar intake, Decreasing salt, Decreasing fats, Increaing proteins  Pharmacy Interventions   Pharmacy Dicussed/Reviewed Pharmacy Topics Discussed, Pharmacy Topics Reviewed, Medication Adherence, Affording Medications, Referral to Pharmacist  Medication Adherence Not taking medication, Unable to refill medication  Referral to Pharmacist Drug interaction/side effects, Cannot afford medications  Safety Interventions   Safety Discussed/Reviewed Safety Discussed, Safety Reviewed, Fall Risk  Advanced Directive Interventions   Advanced Directives Discussed/Reviewed Advanced Directives Discussed     Active Listening & Reflection Utilized.  Verbalization of Feelings Encouraged.  Emotional Support Provided. Feelings of Frustration Validated. Caregiver Stress Acknowledged. Caregiver Resources Reviewed. Caregiver Support Groups Revisted. Self-Enrollment in Caregiver Support Group of Interest Emphasized. Problem Solving Interventions Indicated. Task-Centered Solutions Activated.  Solution-Focused Strategies Employed.  Cognitive Behavioral Therapy Initiated. Acceptance & Commitment Therapy Performed. Client-Centered Therapy Implemented. Confirmed Receipt & Thoroughly Reviewed List of [employer] & Resources & Encouraged Careers information officer, in An Effort to [employer] in The Home:     ~ [employer] in [place]     ~ Home Cleaning & Decluttering Services Please Review Educational Material & Be Prepared to Discuss During Next Scheduled Follow-Up [employer], Mailed on 01/25/2023:   ~ Loneliness &  Isolation: Recruitment consultant   ~ Brewing technologist for Depression   ~ Stress Management: Breathing Exercises for Relaxation Please Practice Deep Breathing Exercises, Relaxation Techniques & Mindfulness Meditation Strategies Daily. Please Contact CSW Directly (# [phone]), if You Have Questions, Need Assistance, or If Additional Social Work Needs Are Identified Between Now & Our Next Scheduled Telephone Follow-Up Outreach Call.  Our next appointment is by telephone on 02/08/2023 at 11:45 am.  Please call the care guide team at [phone] if you need to cancel or reschedule your appointment.   If you are experiencing a Mental Health or Behavioral Health Crisis or need someone to talk to, please call the Suicide and Crisis Lifeline: 988 call the [place] National Suicide Prevention Lifeline: 831-239-2300 or TTY: 727-676-6111 TTY (519)271-0420) to talk to a trained counselor call 1-800-273-TALK (toll free, 24 hour hotline) go to [facility] [address] [place] [phone]) call the [facility] Crisis Line: [phone] call 911  Patient verbalizes understanding of instructions and care plan provided today and agrees to view in MyChart. Active MyChart status and patient understanding of how to access instructions and care plan via MyChart confirmed with patient.     Telephone follow up appointment with care management team member scheduled for:  02/08/2023 at 11:45 am.  [name], BSW, MSW, LCSW  Licensed Clinical Social Worker  [facility]  Mailing [place]. [address] [place], [place] [address] Physical Address[address] [place], [place] [address] Toll Free Main # [phone] Fax # [phone] Cell # [phone] [email or web address]

## 2023-01-25 NOTE — Patient Outreach (Signed)
Care Coordination   Follow Up Visit Note   01/25/2023  Name: Chad Vasquez MRN: [id number] DOB: [birth year]  Chad Vasquez is a 62 y.o. year old male who sees Assunta Found, MD for primary care. [name]  What matters to the patients health and wellness today?  Provide Supportive Counseling Regarding Change in Pain Medication Regimen.   Goals Addressed               This Visit's Progress     Provide Supportive Counseling Regarding Change in Pain Medication Regimen. (pt-stated)   On track     Care Coordination Interventions:  Interventions Today    Flowsheet Row Most Recent Value  Chronic Disease   Chronic disease during today's visit Hypertension (HTN), Chronic Obstructive Pulmonary Disease (COPD), Other  [Multiple Sclerosis, Paraplegia, Spasticity, Polysubstance Abuse, Generalized Weakness & Chronic Pain.]  General Interventions   General Interventions Discussed/Reviewed General Interventions Discussed, General Interventions Reviewed, Annual Eye Exam, Durable Medical Equipment (DME), Health Screening, Community Resources, Doctor Visits, Communication with  [facility] Care Provider]  Doctor Visits Discussed/Reviewed Doctor Visits Discussed, Doctor Visits Reviewed, Annual Wellness Visits, PCP, Specialist  Health Screening Bone Density, Colonoscopy, Prostate  [Encouraged]  Durable Medical Equipment (DME) Bed side commode, BP Cuff, Environmental consultant, Ecologist  PCP/Specialist Visits Compliance with follow-up visit  [Encouraged]  Communication with PCP/Specialists, Charity fundraiser, Pharmacists  Exercise Interventions   Exercise Discussed/Reviewed Exercise Discussed, Exercise Reviewed, Physical Activity, Assistive device use and maintanence  [Encouraged]  Physical Activity Discussed/Reviewed Physical Activity Discussed, Physical Activity Reviewed, Types of exercise, Home Exercise Program (HEP)  [Encouraged]  Education Interventions   Education  Provided Provided Therapist, sports, Provided Education  Provided Verbal Education On Nutrition, Mental Health/Coping with Illness, Exercise, Medication, When to see the doctor, [employer], Development worker, community  Mental Health Interventions   Mental Health Discussed/Reviewed Mental Health Discussed, Mental Health Reviewed, Coping Strategies, Crisis, Suicide, Substance Abuse, Grief and Loss, Depression, Anxiety  Nutrition Interventions   Nutrition Discussed/Reviewed Nutrition Discussed, Nutrition Reviewed, Carbohydrate meal planning, Fluid intake, Portion sizes, Decreasing sugar intake, Decreasing salt, Decreasing fats, Increaing proteins  Pharmacy Interventions   Pharmacy Dicussed/Reviewed Pharmacy Topics Discussed, Pharmacy Topics Reviewed, Medication Adherence, Affording Medications, Referral to Pharmacist  Medication Adherence Not taking medication, Unable to refill medication  Referral to Pharmacist Drug interaction/side effects, Cannot afford medications  Safety Interventions   Safety Discussed/Reviewed Safety Discussed, Safety Reviewed, Fall Risk  Advanced Directive Interventions   Advanced Directives Discussed/Reviewed Advanced Directives Discussed     Active Listening & Reflection Utilized.  Verbalization of Feelings Encouraged.  Emotional Support Provided. Feelings of Frustration Validated. Caregiver Stress Acknowledged. Caregiver Resources Reviewed. Caregiver Support Groups Revisted. Self-Enrollment in Caregiver Support Group of Interest Emphasized. Problem Solving Interventions Indicated. Task-Centered Solutions Activated.  Solution-Focused Strategies Employed.  Cognitive Behavioral Therapy Initiated. Acceptance & Commitment Therapy Performed. Client-Centered Therapy Implemented. Confirmed Receipt & Thoroughly Reviewed List of [employer] & Resources & Encouraged Careers information officer, in An Effort to [employer] in The Home:     ~ [employer] in [place]     ~ Home Cleaning & Decluttering Services Please Review Educational Material & Be Prepared to Discuss During Next Scheduled Follow-Up [employer], Mailed on 01/25/2023:              ~ Loneliness & Isolation: Recruitment consultant   ~ Brewing technologist for Depression   ~ Stress Management: Breathing Exercises for Relaxation Please Practice Deep Breathing  Exercises, Relaxation Techniques & Mindfulness Meditation Strategies Daily. Please Contact CSW Directly (# [phone]), if You Have Questions, Need Assistance, or If Additional Social Work Needs Are Identified Between Now & Our Next Scheduled Telephone Follow-Up Outreach Call.       SDOH assessments and interventions completed:  Yes.  Care Coordination Interventions:  Yes, provided.   Follow up plan: Follow up call scheduled for 02/08/2023 at 11:45 am.   Encounter Outcome:  Pt. Visit Completed.   [name], BSW, MSW, LCSW  Licensed Restaurant manager, fast food [facility]  Mailing [place] [address] [place], [place] [address] Physical Address[address], [place], [place] [address] Toll Free Main # [phone] Fax # [phone] Cell # [phone] [email or web address]

## 2023-01-26 ENCOUNTER — Encounter: Payer: Self-pay | Admitting: Orthopedic Surgery

## 2023-01-26 ENCOUNTER — Ambulatory Visit (INDEPENDENT_AMBULATORY_CARE_PROVIDER_SITE_OTHER): Payer: Medicare Other | Admitting: Orthopedic Surgery

## 2023-01-26 VITALS — BP 146/81 | HR 66 | Ht 75.0 in | Wt 240.0 lb

## 2023-01-26 DIAGNOSIS — M25551 Pain in right hip: Secondary | ICD-10-CM

## 2023-01-26 DIAGNOSIS — M1711 Unilateral primary osteoarthritis, right knee: Secondary | ICD-10-CM | POA: Diagnosis not present

## 2023-01-26 NOTE — Progress Notes (Signed)
New Patient Visit  Assessment: Chad Vasquez is a 62 y.o. male with the following: 1. Arthritis of right knee 2. Pain in right hip  Plan: Chad Vasquez has pain in his right knee.  Radiographs demonstrate some advanced degenerative changes.  He also has degenerative changes of the right hip, but this is not currently bothering him.  Recent prednisone prescription has improved the pain in the hip.  He is not able to ambulate, but does stand to transfer.  We discussed proceeding with an injection of the right knee, due to the current pain, and he would like to proceed.  This was completed the clinic today without issues.  He will follow-up as needed.  Procedure note injection Right knee joint   Verbal consent was obtained to inject the right knee joint  Timeout was completed to confirm the site of injection.  The skin was prepped with alcohol and ethyl chloride was sprayed at the injection site.  A 21-gauge needle was used to inject 40 mg of Depo-Medrol and 1% lidocaine (3 cc) into the right knee using an anterolateral approach.  There were no complications. A sterile bandage was applied.   Follow-up: Return if symptoms worsen or fail to improve.  Subjective:  Chief Complaint  Patient presents with   New Patient (Initial Visit)    RT hip/  C/o groin pain.Patient fell 09/2022 while getting up from using bathroom. His leg went "one way and everything else went another'. States leg was "twisted around". Pain will wake him up during the night.     History of Present Illness: Chad Vasquez is a 62 y.o. male who presents for evaluation of right knee and right hip pain.  A few months ago, he fell in his bathroom.  Since then, has had worsening pain in the right knee, as well as the right hip.  He does have a history of MS.  He is confined to a motorized wheelchair.  He has limited function of his lower extremities.  He can stand to transfer, but otherwise has been ambulating many years.   Pain is in the medial aspect of the right knee.  He recently started a prescription for prednisone, and the pain in the hip and groin area has significantly improved.  No recent injections in his right knee.   Review of Systems: No fevers or chills No numbness or tingling No chest pain No shortness of breath No bowel or bladder dysfunction No GI distress No headaches   Medical History:  Past Medical History:  Diagnosis Date   Anxiety and depression    Arthritis    Chronic pain    COPD (chronic obstructive pulmonary disease)    Coronary artery disease    GERD (gastroesophageal reflux disease)    Hiatal hernia    HTN (hypertension)    Hyperlipidemia    Multiple sclerosis 2010   Polysubstance abuse 2013   Sarcoidosis    2013, ???   Schatzki's ring    mild   Sleep apnea    CPAP    Past Surgical History:  Procedure Laterality Date   CARDIAC CATHETERIZATION  2005   CHOLECYSTECTOMY N/A 04/16/2013   Procedure: LAPAROSCOPIC CHOLECYSTECTOMY;  Surgeon: Dalia Heading, MD;  Location: [facility];  Service: General;  Laterality: N/A;   COLONOSCOPY  2011   Dr. Jena Gauss: normal   ESOPHAGOGASTRODUODENOSCOPY (EGD) WITH PROPOFOL N/A 02/16/2016   Dr.Rourk- [place] grade A esophagitis, small hiatal hernia, normal second portion of the duodenum,  mild schatzki ring, no specimens collected.   MALONEY DILATION N/A 02/16/2016   Procedure: Elease Hashimoto DILATION;  Surgeon: Corbin Ade, MD;  Location: [facility] SU[facility];  Service: Endoscopy;  Laterality: N/A;   MEDIASTINOSCOPY  05/15/2012   Procedure: MEDIASTINOSCOPY;  Surgeon: Loreli Slot, MD;  Location: [facility] OR;  Service: Thoracic;  Laterality: N/A;    Family History  Problem Relation Age of Onset   Heart failure Mother    Stroke Mother    Heart attack Mother 53   Heart failure Father    Heart attack Father 35       CABG   Breast cancer Sister    Seizures Brother    Breast cancer Sister    Liver disease Neg Hx    Colon cancer Neg Hx    Social  History   Tobacco Use   Smoking status: Former    Packs/day: 1.00    Years: 30.00    Additional pack years: 0.00    Total pack years: 30.00    Types: Cigarettes    Quit date: 05/01/2012    Years since quitting: 10.7    Passive exposure: Past   Smokeless tobacco: Never   Tobacco comments:    Quit x 4 years  Vaping Use   Vaping Use: Never used  Substance Use Topics   Alcohol use: No    Alcohol/week: 0.0 standard drinks of alcohol    Comment: quit 05/01/2012   Drug use: No    Types: Cocaine, Marijuana    Comment: quit 05/01/2012    No Known Allergies  Current Meds  Medication Sig   baclofen (LIORESAL) 20 MG tablet Take 1 tablet (20 mg total) by mouth 3 (three) times daily.   Biotin [address] MCG TABS Take 50,000 mcg by mouth daily.   buPROPion (WELLBUTRIN XL) 150 MG 24 hr tablet Take 150 mg by mouth daily.   calcium carbonate (TUMS - DOSED IN MG ELEMENTAL CALCIUM) 500 MG chewable tablet Chew 1 tablet by mouth daily as needed for indigestion or heartburn. Reported on 02/03/2016   cyanocobalamin (,VITAMIN B-12,) 1000 MCG/ML injection Inject 1,000 mcg into the muscle every 30 (thirty) days.   diazepam (VALIUM) 5 MG tablet Take 1 tablet (5 mg total) by mouth in the morning, at noon, in the evening, and at bedtime.   docusate sodium (COLACE) 100 MG capsule Take 200 mg by mouth 2 (two) times daily as needed for mild constipation. Reported on 02/03/2016   losartan (COZAAR) 100 MG tablet Take 50 mg by mouth every morning.    PROAIR HFA 108 (90 BASE) MCG/ACT inhaler INHALE TWO PUFFS INTO THE LUNGS EVERY SIX HOURS AS NEEDED FOR WHEEZING   Probiotic Product (PROBIOTIC DAILY PO) Take 1 capsule by mouth daily.   Vitamin D, Ergocalciferol, (DRISDOL) 1.25 MG (50000 UNIT) CAPS capsule Take 1 capsule (50,000 Units total) by mouth every 7 (seven) days. Takes on Wedneday    Objective: BP (!) 146/81   Pulse 66   Ht  (1.905 m)   Wt 240 lb (108.9 kg)   BMI 30.00 kg/m   Physical Exam:  General:  Alert and oriented., No acute distress., and Seated in a wheelchair. Gait: Unable to ambulate.  Evaluation of the right knee demonstrates no swelling.  He has tenderness to palpation along medial joint line.  No increased laxity to varus and valgus stress.  Limited lower body motorized function.  Sensation is intact throughout the lower legs.  Negative Lachman.  No pain  with internal or external rotation of the right hip.  He tolerates gentle motion of the right hip.   IMAGING: I personally reviewed images previously obtained in clinic   The right knee and the right hip were previously obtained.  Degenerative changes noted within the right knee, specifically within the medial knee.  Mild to moderate degenerative changes of the right hip.   New Medications:  No orders of the defined types were placed in this encounter.     Oliver Barre, MD  01/26/2023 3:03 PM

## 2023-01-26 NOTE — Patient Instructions (Signed)

## 2023-02-08 ENCOUNTER — Encounter: Payer: Self-pay | Admitting: *Deleted

## 2023-02-08 ENCOUNTER — Ambulatory Visit: Payer: Self-pay | Admitting: *Deleted

## 2023-02-08 NOTE — Patient Outreach (Signed)
Care Coordination   Follow Up Visit Note   02/08/2023  Name: Chad Vasquez MRN: [id number] DOB: 11-20-1960  Chad Vasquez is a 62 y.o. year old male who sees Assunta Found, MD for primary care. [name]  What matters to the patients health and wellness today?  Provide Supportive Counseling Regarding Change in Pain Medication Regimen.   Goals Addressed               This Visit's Progress     Provide Supportive Counseling Regarding Change in Pain Medication Regimen. (pt-stated)   On track     Care Coordination Interventions:  Interventions Today    Flowsheet Row Most Recent Value  Chronic Disease   Chronic disease during today's visit Hypertension (HTN), Chronic Obstructive Pulmonary Disease (COPD), Other  [Multiple Sclerosis, Paraplegia, Spasticity, Polysubstance Abuse, Generalized Weakness & Chronic Pain.]  General Interventions   General Interventions Discussed/Reviewed General Interventions Discussed, General Interventions Reviewed, Annual Eye Exam, Durable Medical Equipment (DME), Health Screening, Community Resources, Doctor Visits, Communication with  [facility] Care Care Provider]  Doctor Visits Discussed/Reviewed Doctor Visits Discussed, Doctor Visits Reviewed, Annual Wellness Visits, PCP, Specialist  Health Screening Bone Density, Colonoscopy, Prostate  [Encouraged]  Durable Medical Equipment (DME) Bed side commode, BP Cuff, Environmental consultant, Ecologist  PCP/Specialist Visits Compliance with follow-up visit  [Encouraged]  Communication with PCP/Specialists, Charity fundraiser, Pharmacists  Exercise Interventions   Exercise Discussed/Reviewed Exercise Discussed, Exercise Reviewed, Physical Activity, Assistive device use and maintanence  [Encouraged]  Physical Activity Discussed/Reviewed Physical Activity Discussed, Physical Activity Reviewed, Types of exercise, Home Exercise Program (HEP)  [Encouraged]  Education Interventions   Education  Provided Provided Therapist, sports, Provided Education  Provided Verbal Education On Nutrition, Mental Health/Coping with Illness, Exercise, Medication, When to see the doctor, [employer], Development worker, community  Mental Health Interventions   Mental Health Discussed/Reviewed Mental Health Discussed, Mental Health Reviewed, Coping Strategies, Crisis, Suicide, Substance Abuse, Grief and Loss, Depression, Anxiety  Nutrition Interventions   Nutrition Discussed/Reviewed Nutrition Discussed, Nutrition Reviewed, Carbohydrate meal planning, Fluid intake, Portion sizes, Decreasing sugar intake, Decreasing salt, Decreasing fats, Increaing proteins  Pharmacy Interventions   Pharmacy Dicussed/Reviewed Pharmacy Topics Discussed, Pharmacy Topics Reviewed, Medication Adherence, Affording Medications, Referral to Pharmacist  Medication Adherence Not taking medication, Unable to refill medication  Referral to Pharmacist Drug interaction/side effects, Cannot afford medications  Safety Interventions   Safety Discussed/Reviewed Safety Discussed, Safety Reviewed, Fall Risk  Advanced Directive Interventions   Advanced Directives Discussed/Reviewed Advanced Directives Discussed     Active Listening & Reflection Utilized.  Verbalization of Feelings Encouraged.  Emotional Support Provided. Feelings of Frustration Validated. Caregiver Stress Acknowledged. Problem Solving Interventions Indicated. Task-Centered Solutions Activated.  Solution-Focused Strategies Employed.  Cognitive Behavioral Therapy Initiated. Acceptance & Commitment Therapy Performed. Client-Centered Therapy Implemented. Continue Administrator, arts of Interest, Providing Handyman, Engineer, site, from List Provided. Confirmed Receipt & Thoroughly Reviewed Educational Material on "Loneliness & Isolation: Recruitment consultant". Confirmed Receipt & Thoroughly Reviewed Educational Material on  "Relaxation Techniques for Depression". Confirmed Receipt & Thoroughly Reviewed Educational Material on "Stress Management: Breathing Exercises for Relaxation". Please Continue to Practice Deep Breathing Exercises, Relaxation Techniques & Mindfulness Meditation Strategies Daily. Please Contact CSW Directly (# [phone]), if You Have Questions, Need Assistance, or If Additional Social Work Needs Are Identified Between Now & Our Next Scheduled Telephone Follow-Up Outreach Call.      SDOH assessments and interventions completed:  Yes.  Care Coordination  Interventions:  Yes, provided.   Follow up plan: Follow up call scheduled for 02/22/2023 at 10:15 am.   Encounter Outcome:  Pt. Visit Completed.   [name], BSW, MSW, LCSW  Licensed Restaurant manager, fast food [facility]  Mailing [place] [address] [place], [place] [address] Physical Address-[address], [place], [place] [address] Toll Free Main # [phone] Fax # [phone] Cell # [phone] [email or web address]

## 2023-02-08 NOTE — Patient Instructions (Signed)
Visit Information  Thank you for taking time to visit with me today. Please don't hesitate to contact me if I can be of assistance to you.   Following are the goals we discussed today:   Goals Addressed               This Visit's Progress     Provide Supportive Counseling Regarding Change in Pain Medication Regimen. (pt-stated)   On track     Care Coordination Interventions:  Interventions Today    Flowsheet Row Most Recent Value  Chronic Disease   Chronic disease during today's visit Hypertension (HTN), Chronic Obstructive Pulmonary Disease (COPD), Other  [Multiple Sclerosis, Paraplegia, Spasticity, Polysubstance Abuse, Generalized Weakness & Chronic Pain.]  General Interventions   General Interventions Discussed/Reviewed General Interventions Discussed, General Interventions Reviewed, Annual Eye Exam, Durable Medical Equipment (DME), Health Screening, Community Resources, Doctor Visits, Communication with  [facility]entry Health Care Care Provider]  Doctor Visits Discussed/Reviewed Doctor Visits Discussed, Doctor Visits Reviewed, Annual Wellness Visits, PCP, Specialist  Health Screening Bone Density, Colonoscopy, Prostate  [Encouraged]  Durable Medical Equipment (DME) Bed side commode, BP Cuff, Environmental consultant, Ecologist  PCP/Specialist Visits Compliance with follow-up visit  [Encouraged]  Communication with PCP/Specialists, Charity fundraiser, Pharmacists  Exercise Interventions   Exercise Discussed/Reviewed Exercise Discussed, Exercise Reviewed, Physical Activity, Assistive device use and maintanence  [Encouraged]  Physical Activity Discussed/Reviewed Physical Activity Discussed, Physical Activity Reviewed, Types of exercise, Home Exercise Program (HEP)  [Encouraged]  Education Interventions   Education Provided Provided Therapist, sports, Provided Education  Provided Verbal Education On Nutrition, Mental Health/Coping with Illness, Exercise, Medication, When to see the doctor, [employer],  Development worker, community  Mental Health Interventions   Mental Health Discussed/Reviewed Mental Health Discussed, Mental Health Reviewed, Coping Strategies, Crisis, Suicide, Substance Abuse, Grief and Loss, Depression, Anxiety  Nutrition Interventions   Nutrition Discussed/Reviewed Nutrition Discussed, Nutrition Reviewed, Carbohydrate meal planning, Fluid intake, Portion sizes, Decreasing sugar intake, Decreasing salt, Decreasing fats, Increaing proteins  Pharmacy Interventions   Pharmacy Dicussed/Reviewed Pharmacy Topics Discussed, Pharmacy Topics Reviewed, Medication Adherence, Affording Medications, Referral to Pharmacist  Medication Adherence Not taking medication, Unable to refill medication  Referral to Pharmacist Drug interaction/side effects, Cannot afford medications  Safety Interventions   Safety Discussed/Reviewed Safety Discussed, Safety Reviewed, Fall Risk  Advanced Directive Interventions   Advanced Directives Discussed/Reviewed Advanced Directives Discussed     Active Listening & Reflection Utilized.  Verbalization of Feelings Encouraged.  Emotional Support Provided. Feelings of Frustration Validated. Caregiver Stress Acknowledged. Problem Solving Interventions Indicated. Task-Centered Solutions Activated.  Solution-Focused Strategies Employed.  Cognitive Behavioral Therapy Initiated. Acceptance & Commitment Therapy Performed. Client-Centered Therapy Implemented. Continue Administrator, arts of Interest, Providing Handyman, Engineer, site, from List Provided. Confirmed Receipt & Thoroughly Reviewed Educational Material on "Loneliness & Isolation: Recruitment consultant". Confirmed Receipt & Thoroughly Reviewed Educational Material on "Relaxation Techniques for Depression". Confirmed Receipt & Thoroughly Reviewed Educational Material on "Stress Management: Breathing Exercises for Relaxation". Please Continue to Practice Deep Breathing  Exercises, Relaxation Techniques & Mindfulness Meditation Strategies Daily. Please Contact CSW Directly (# [phone]), if You Have Questions, Need Assistance, or If Additional Social Work Needs Are Identified Between Now & Our Next Scheduled Telephone Follow-Up Outreach Call.      Our next appointment is by telephone on 02/22/2023 at 10:15 am.   Please call the care guide team at [phone] if you need to cancel or reschedule your appointment.   If you are experiencing a Mental Health or Behavioral Health  Crisis or need someone to talk to, please call the Suicide and Crisis Lifeline: 988 call the [place] National Suicide Prevention Lifeline: 260 309 6425 or TTY: 725-177-9954 TTY 785-068-4959) to talk to a trained counselor call 1-800-273-TALK (toll free, 24 hour hotline) go to [facility] [address], [place] [phone]) call the [facility] Crisis Line: 831-520-6430 call 911  Patient verbalizes understanding of instructions and care plan provided today and agrees to view in MyChart. Active MyChart status and patient understanding of how to access instructions and care plan via MyChart confirmed with patient.     Telephone follow up appointment with care management team member scheduled for:  02/22/2023 at 10:15 am.   [name], BSW, MSW, LCSW  Licensed Clinical Social Worker  [employer] [facility] [facility]  Mailing [address]. [address], [place], [place] [address] Physical Address-[address], [place], [place] [address] Toll Free Main # [phone] Fax # [phone] Cell # [phone] [email or web address]

## 2023-02-22 ENCOUNTER — Ambulatory Visit: Payer: Self-pay | Admitting: *Deleted

## 2023-02-22 ENCOUNTER — Encounter: Payer: Self-pay | Admitting: *Deleted

## 2023-02-22 NOTE — Patient Outreach (Signed)
Care Coordination   Follow Up Visit Note   02/22/2023  Name: Chad Vasquez MRN: [id number] DOB: 1960/11/11  Chad Vasquez is a 62 y.o. year old male who sees Assunta Found, MD for primary care. [name]  What matters to the patients health and wellness today?  Provide Supportive Counseling Regarding Change in Pain Medication Regimen.   Goals Addressed               This Visit's Progress     Provide Supportive Counseling Regarding Change in Pain Medication Regimen. (pt-stated)   On track     Care Coordination Interventions:  Interventions Today    Flowsheet Row Most Recent Value  Chronic Disease   Chronic disease during today's visit Hypertension (HTN), Chronic Obstructive Pulmonary Disease (COPD), Other  [Multiple Sclerosis, Paraplegia, Spasticity, Polysubstance Abuse, Generalized Weakness & Chronic Pain.]  General Interventions   General Interventions Discussed/Reviewed General Interventions Discussed, General Interventions Reviewed, Annual Eye Exam, Durable Medical Equipment (DME), Health Screening, Community Resources, Doctor Visits, Communication with  [employer] Health Care Care Provider]  Doctor Visits Discussed/Reviewed Doctor Visits Discussed, Doctor Visits Reviewed, Annual Wellness Visits, PCP, Specialist  Health Screening Bone Density, Colonoscopy, Prostate  [Encouraged]  Durable Medical Equipment (DME) Bed side commode, BP Cuff, Environmental consultant, Ecologist  PCP/Specialist Visits Compliance with follow-up visit  [Encouraged]  Communication with PCP/Specialists, Charity fundraiser, Pharmacists  Exercise Interventions   Exercise Discussed/Reviewed Exercise Discussed, Exercise Reviewed, Physical Activity, Assistive device use and maintanence  [Encouraged]  Physical Activity Discussed/Reviewed Physical Activity Discussed, Physical Activity Reviewed, Types of exercise, Home Exercise Program (HEP)  [Encouraged]  Education Interventions   Education  Provided Provided Therapist, sports, Provided Education  Provided Verbal Education On Nutrition, Mental Health/Coping with Illness, Exercise, Medication, When to see the doctor, [employer], Development worker, community  Mental Health Interventions   Mental Health Discussed/Reviewed Mental Health Discussed, Mental Health Reviewed, Coping Strategies, Crisis, Suicide, Substance Abuse, Grief and Loss, Depression, Anxiety  Nutrition Interventions   Nutrition Discussed/Reviewed Nutrition Discussed, Nutrition Reviewed, Carbohydrate meal planning, Fluid intake, Portion sizes, Decreasing sugar intake, Decreasing salt, Decreasing fats, Increaing proteins  Pharmacy Interventions   Pharmacy Dicussed/Reviewed Pharmacy Topics Discussed, Pharmacy Topics Reviewed, Medication Adherence, Affording Medications, Referral to Pharmacist  Medication Adherence Not taking medication, Unable to refill medication  Referral to Pharmacist Drug interaction/side effects, Cannot afford medications  Safety Interventions   Safety Discussed/Reviewed Safety Discussed, Safety Reviewed, Fall Risk  Advanced Directive Interventions   Advanced Directives Discussed/Reviewed Advanced Directives Discussed     Active Listening & Reflection Utilized.  Verbalization of Feelings Encouraged.  Emotional Support Provided. Feelings of Frustration Validated. Caregiver Stress Acknowledged. Problem Solving Interventions Employed. Task-Centered Solutions Indicated.  Solution-Focused Strategies Activated.  Cognitive Behavioral Therapy Initiated. Acceptance & Commitment Therapy Performed. Client-Centered Therapy Enacted. Implementation of Deep Breathing Exercises, Relaxation Techniques & Mindfulness Meditation Strategies Encouraged Daily. Confirmed Interest in Wanting to Obtain an [employer] Power Assisted Movement Leg Exercise Machine, to Try & Combat Lower Extremity Pain & Weakness.   CSW Collaboration with Primary Care Provider, Dr. Assunta Found with [facility] [phone]), to Request Order for [employer] Motorized Power Assisted Movement Leg Exercise Machine to Specifically Indicate the Following Diagnoses: Multiple Sclerosis Exacerbation, Paraplegia, Spasticity, Chronic Lower Extremity Pain & Generalized Weakness. CSW Collaboration with Primary Care Provider, Dr. Assunta Found with [facility] [phone]), to Request Order for [facility] Motorized Power Assisted Movement Leg Exercise Machine Be Faxed to [facility]  Company of Interest in [place]. CSW Energy manager at [employer] in [place] [phone]), to Confirm Ability to Order [employer] Power Assisted Movement Leg Exercise Machine. Please Contact CSW Directly (# [phone]), if You Have Questions, Need Assistance, or If Additional Social Work Needs Are Identified Between Now & Our Next Scheduled Telephone Follow-Up Outreach Call.      SDOH assessments and interventions completed:  Yes.  Care Coordination Interventions:  Yes, provided.   Follow up plan: Follow up call scheduled for 03/08/2023 at 11:30 am.  Encounter Outcome:  Pt. Visit Completed.   [name], BSW, MSW, LCSW  Licensed Restaurant manager, fast food [facility]  Mailing [place] [address], [place], [place] [address] Physical Address[address] [place], [place] [address] Toll Free Main # [phone] Fax # [phone] Cell # [phone] [email or web address]

## 2023-02-22 NOTE — Patient Instructions (Signed)
Visit Information  Thank you for taking time to visit with me today. Please don't hesitate to contact me if I can be of assistance to you.   Following are the goals we discussed today:   Goals Addressed               This Visit's Progress     Provide Supportive Counseling Regarding Change in Pain Medication Regimen. (pt-stated)   On track     Care Coordination Interventions:  Interventions Today    Flowsheet Row Most Recent Value  Chronic Disease   Chronic disease during today's visit Hypertension (HTN), Chronic Obstructive Pulmonary Disease (COPD), Other  [Multiple Sclerosis, Paraplegia, Spasticity, Polysubstance Abuse, Generalized Weakness & Chronic Pain.]  General Interventions   General Interventions Discussed/Reviewed General Interventions Discussed, General Interventions Reviewed, Annual Eye Exam, Durable Medical Equipment (DME), Health Screening, Community Resources, Doctor Visits, Communication with  [employer]oventry Health Care Care Provider]  Doctor Visits Discussed/Reviewed Doctor Visits Discussed, Doctor Visits Reviewed, Annual Wellness Visits, PCP, Specialist  Health Screening Bone Density, Colonoscopy, Prostate  [Encouraged]  Durable Medical Equipment (DME) Bed side commode, BP Cuff, Environmental consultant, Ecologist  PCP/Specialist Visits Compliance with follow-up visit  [Encouraged]  Communication with PCP/Specialists, Charity fundraiser, Pharmacists  Exercise Interventions   Exercise Discussed/Reviewed Exercise Discussed, Exercise Reviewed, Physical Activity, Assistive device use and maintanence  [Encouraged]  Physical Activity Discussed/Reviewed Physical Activity Discussed, Physical Activity Reviewed, Types of exercise, Home Exercise Program (HEP)  [Encouraged]  Education Interventions   Education Provided Provided Therapist, sports, Provided Education  Provided Verbal Education On Nutrition, Mental Health/Coping with Illness, Exercise, Medication, When to see the doctor, [employer],  Development worker, community  Mental Health Interventions   Mental Health Discussed/Reviewed Mental Health Discussed, Mental Health Reviewed, Coping Strategies, Crisis, Suicide, Substance Abuse, Grief and Loss, Depression, Anxiety  Nutrition Interventions   Nutrition Discussed/Reviewed Nutrition Discussed, Nutrition Reviewed, Carbohydrate meal planning, Fluid intake, Portion sizes, Decreasing sugar intake, Decreasing salt, Decreasing fats, Increaing proteins  Pharmacy Interventions   Pharmacy Dicussed/Reviewed Pharmacy Topics Discussed, Pharmacy Topics Reviewed, Medication Adherence, Affording Medications, Referral to Pharmacist  Medication Adherence Not taking medication, Unable to refill medication  Referral to Pharmacist Drug interaction/side effects, Cannot afford medications  Safety Interventions   Safety Discussed/Reviewed Safety Discussed, Safety Reviewed, Fall Risk  Advanced Directive Interventions   Advanced Directives Discussed/Reviewed Advanced Directives Discussed     Active Listening & Reflection Utilized.  Verbalization of Feelings Encouraged.  Emotional Support Provided. Feelings of Frustration Validated. Caregiver Stress Acknowledged. Problem Solving Interventions Employed. Task-Centered Solutions Indicated.  Solution-Focused Strategies Activated.  Cognitive Behavioral Therapy Initiated. Acceptance & Commitment Therapy Performed. Client-Centered Therapy Enacted. Implementation of Deep Breathing Exercises, Relaxation Techniques & Mindfulness Meditation Strategies Encouraged Daily. Confirmed Interest in Wanting to Obtain an [employer] Power Assisted Movement Leg Exercise Machine, to Try & Combat Lower Extremity Pain & Weakness.   CSW Collaboration with Primary Care Provider, Dr. Assunta Found with [facility] [phone]), to Request Order for [employer] Motorized Power Assisted Movement Leg Exercise Machine to Specifically Indicate the Following Diagnoses: Multiple  Sclerosis Exacerbation, Paraplegia, Spasticity, Chronic Lower Extremity Pain & Generalized Weakness. CSW Collaboration with Primary Care Provider, Dr. Assunta Found with [facility] [phone]), to Request Order for [facility] Motorized Power Assisted Movement Leg Exercise Machine Be Faxed to [employer] of Interest in [place]. CSW Energy manager at [employer] in [place] [phone]), to Confirm Ability to Order [employer] Power Assisted Movement Leg Exercise Machine. Please  Contact CSW Directly (# [phone]), if You Have Questions, Need Assistance, or If Additional Social Work Needs Are Identified Between Now & Our Next Scheduled Telephone Follow-Up Outreach Call.      Our next appointment is by telephone on 03/08/2023 at 11:30 am.  Please call the care guide team at [phone] if you need to cancel or reschedule your appointment.   If you are experiencing a Mental Health or Behavioral Health Crisis or need someone to talk to, please call the Suicide and Crisis Lifeline: 988 call the [place] National Suicide Prevention Lifeline: 272-366-1879 or TTY: (325) 885-0281 TTY 516-248-5920) to talk to a trained counselor call 1-800-273-TALK (toll free, 24 hour hotline) go to [facility] [address] [place] [phone]) call the [facility] Crisis Line: (312)080-5746 call 911  Patient verbalizes understanding of instructions and care plan provided today and agrees to view in MyChart. Active MyChart status and patient understanding of how to access instructions and care plan via MyChart confirmed with patient.     Telephone follow up appointment with care management team member scheduled for:  03/08/2023 at 11:30 am.  [name], BSW, MSW, LCSW  Licensed Clinical Social Worker  [facility]  Mailing  [place]. [address], [place], [place] [address] Physical Address[address], [place], [place] [address] Toll Free Main # [phone] Fax # [phone] Cell # [phone] [email or web address]

## 2023-03-07 ENCOUNTER — Ambulatory Visit: Payer: Self-pay | Admitting: *Deleted

## 2023-03-07 NOTE — Patient Outreach (Signed)
  Care Coordination   03/07/2023 Name: Chad Vasquez MRN: [id number] DOB: 11-22-1960   Care Coordination Outreach Attempts:  An unsuccessful telephone outreach was attempted for a scheduled appointment today.  Follow Up Plan:  Additional outreach attempts will be made to offer the patient care coordination information and services.   Encounter Outcome:  No Answer. Left HIPAA compliant VM.   Care Coordination Interventions:  No, not indicated    Demetrios Loll, BSN, RN-BC RN Care Coordinator [facility]  [facility] Direct Dial: [phone] Main #: [phone]

## 2023-03-08 ENCOUNTER — Encounter: Payer: Self-pay | Admitting: *Deleted

## 2023-03-08 ENCOUNTER — Other Ambulatory Visit: Payer: Self-pay | Admitting: Neurology

## 2023-03-08 ENCOUNTER — Ambulatory Visit: Payer: Self-pay | Admitting: *Deleted

## 2023-03-08 MED ORDER — DIAZEPAM 5 MG PO TABS: 5.0000 mg | ORAL_TABLET | Freq: Four times a day (QID) | ORAL | 5 refills | Status: AC

## 2023-03-08 NOTE — Patient Instructions (Signed)
Visit Information  Thank you for taking time to visit with me today. Please don't hesitate to contact me if I can be of assistance to you.   Following are the goals we discussed today:   Goals Addressed               This Visit's Progress     Provide Supportive Counseling Regarding Change in Pain Medication Regimen. (pt-stated)   On track     Care Coordination Interventions:  Interventions Today    Flowsheet Row Most Recent Value  Chronic Disease   Chronic disease during today's visit Hypertension (HTN), Chronic Obstructive Pulmonary Disease (COPD), Other  [Multiple Sclerosis, Paraplegia, Spasticity, Polysubstance Abuse, Generalized Weakness & Chronic Pain.]  General Interventions   General Interventions Discussed/Reviewed General Interventions Discussed, General Interventions Reviewed, Annual Eye Exam, Durable Medical Equipment (DME), Health Screening, Community Resources, Doctor Visits, Communication with  [employer] Health Care Care Provider]  Doctor Visits Discussed/Reviewed Doctor Visits Discussed, Doctor Visits Reviewed, Annual Wellness Visits, PCP, Specialist  Health Screening Bone Density, Colonoscopy, Prostate  [Encouraged]  Durable Medical Equipment (DME) Bed side commode, BP Cuff, Environmental consultant, Ecologist  PCP/Specialist Visits Compliance with follow-up visit  [Encouraged]  Communication with PCP/Specialists, Charity fundraiser, Pharmacists  Exercise Interventions   Exercise Discussed/Reviewed Exercise Discussed, Exercise Reviewed, Physical Activity, Assistive device use and maintanence  [Encouraged]  Physical Activity Discussed/Reviewed Physical Activity Discussed, Physical Activity Reviewed, Types of exercise, Home Exercise Program (HEP)  [Encouraged]  Education Interventions   Education Provided Provided Therapist, sports, Provided Education  Provided Verbal Education On Nutrition, Mental Health/Coping with Illness, Exercise, Medication, When to see the doctor, [facility],  Development worker, community  Mental Health Interventions   Mental Health Discussed/Reviewed Mental Health Discussed, Mental Health Reviewed, Coping Strategies, Crisis, Suicide, Substance Abuse, Grief and Loss, Depression, Anxiety  Nutrition Interventions   Nutrition Discussed/Reviewed Nutrition Discussed, Nutrition Reviewed, Carbohydrate meal planning, Fluid intake, Portion sizes, Decreasing sugar intake, Decreasing salt, Decreasing fats, Increaing proteins  Pharmacy Interventions   Pharmacy Dicussed/Reviewed Pharmacy Topics Discussed, Pharmacy Topics Reviewed, Medication Adherence, Affording Medications, Referral to Pharmacist  Medication Adherence Not taking medication, Unable to refill medication  Referral to Pharmacist Drug interaction/side effects, Cannot afford medications  Safety Interventions   Safety Discussed/Reviewed Safety Discussed, Safety Reviewed, Fall Risk  Advanced Directive Interventions   Advanced Directives Discussed/Reviewed Advanced Directives Discussed     Active Listening & Reflection Utilized.  Verbalization of Feelings Encouraged.  Emotional Support Provided. Feelings of Frustration Validated. Problem Solving Interventions Employed. Task-Centered Solutions Indicated.  Solution-Focused Strategies Activated.  Cognitive Behavioral Therapy Initiated. Acceptance & Commitment Therapy Performed. Client-Centered Therapy Enacted. CSW Collaboration with Primary Care Provider, Dr. Assunta Found with [facility] [phone]), to Request Order for [employer] Motorized Power Assisted Movement Leg Exercise Machine to Specifically Indicate the Following Diagnoses: Multiple Sclerosis Exacerbation, Paraplegia, Spasticity, Chronic Lower Extremity Pain & Generalized Weakness. CSW Collaboration with Primary Care Provider, Dr. Assunta Found with [facility] [phone]), to Request Order for [facility] Motorized Power Assisted Movement Leg Exercise Machine Be Faxed to  [employer] of Interest in [place]. CSW Energy manager at [employer] in [place] [phone]), to Confirm Ability to Order [employer] Power Assisted Movement Leg Exercise Machine. Please Contact CSW Directly (# [phone]), if You Have Questions, Need Assistance, or If Additional Social Work Needs Are Identified Between Now & Our Next Scheduled Telephone Follow-Up Outreach Call.      Our next appointment is by telephone on 03/22/2023  at 10:45 am.  Please call the care guide team at [phone] if you need to cancel or reschedule your appointment.   If you are experiencing a Mental Health or Behavioral Health Crisis or need someone to talk to, please call the Suicide and Crisis Lifeline: 988 call the [place] National Suicide Prevention Lifeline: 319-020-2934 or TTY: 620-487-1534 TTY (714) 412-6488) to talk to a trained counselor call 1-800-273-TALK (toll free, 24 hour hotline) go to [facility] [address] [place] [phone]) call the [facility] Crisis Line: (208) [phone] call 911  Patient verbalizes understanding of instructions and care plan provided today and agrees to view in MyChart. Active MyChart status and patient understanding of how to access instructions and care plan via MyChart confirmed with patient.     Telephone follow up appointment with care management team member scheduled for:  03/22/2023 at 10:45 am.  [name], BSW, MSW, LCSW  Licensed Clinical Social Worker  [facility]  Mailing [place]. [address] [place], [place] [address] Physical Address[address], [place], [place] [address] Toll Free Main # [phone] Fax # [phone] Cell # [phone] [email or web address]

## 2023-03-08 NOTE — Telephone Encounter (Signed)
Last seen on 11/16/22 Follow up scheduled on 03/23/23 Last filled on 02/04/2023 Pending on sined

## 2023-03-08 NOTE — Patient Outreach (Signed)
Care Coordination   Follow Up Visit Note   03/08/2023  Name: JONES STRIMPLE MRN: [id number] DOB: 12-29-1960  MAZIN FLORKOWSKI is a 62 y.o. year old male who sees Assunta Found, MD for primary care. [name]  What matters to the patients health and wellness today?  Provide Supportive Counseling Regarding Change in Pain Medication Regimen.   Goals Addressed               This Visit's Progress     Provide Supportive Counseling Regarding Change in Pain Medication Regimen. (pt-stated)   On track     Care Coordination Interventions:  Interventions Today    Flowsheet Row Most Recent Value  Chronic Disease   Chronic disease during today's visit Hypertension (HTN), Chronic Obstructive Pulmonary Disease (COPD), Other  [Multiple Sclerosis, Paraplegia, Spasticity, Polysubstance Abuse, Generalized Weakness & Chronic Pain.]  General Interventions   General Interventions Discussed/Reviewed General Interventions Discussed, General Interventions Reviewed, Annual Eye Exam, Durable Medical Equipment (DME), Health Screening, Community Resources, Doctor Visits, Communication with  [employer] Health Care Care Provider]  Doctor Visits Discussed/Reviewed Doctor Visits Discussed, Doctor Visits Reviewed, Annual Wellness Visits, PCP, Specialist  Health Screening Bone Density, Colonoscopy, Prostate  [Encouraged]  Durable Medical Equipment (DME) Bed side commode, BP Cuff, Environmental consultant, Ecologist  PCP/Specialist Visits Compliance with follow-up visit  [Encouraged]  Communication with PCP/Specialists, Charity fundraiser, Pharmacists  Exercise Interventions   Exercise Discussed/Reviewed Exercise Discussed, Exercise Reviewed, Physical Activity, Assistive device use and maintanence  [Encouraged]  Physical Activity Discussed/Reviewed Physical Activity Discussed, Physical Activity Reviewed, Types of exercise, Home Exercise Program (HEP)  [Encouraged]  Education Interventions   Education  Provided Provided Therapist, sports, Provided Education  Provided Verbal Education On Nutrition, Mental Health/Coping with Illness, Exercise, Medication, When to see the doctor, [facility], Development worker, community  Mental Health Interventions   Mental Health Discussed/Reviewed Mental Health Discussed, Mental Health Reviewed, Coping Strategies, Crisis, Suicide, Substance Abuse, Grief and Loss, Depression, Anxiety  Nutrition Interventions   Nutrition Discussed/Reviewed Nutrition Discussed, Nutrition Reviewed, Carbohydrate meal planning, Fluid intake, Portion sizes, Decreasing sugar intake, Decreasing salt, Decreasing fats, Increaing proteins  Pharmacy Interventions   Pharmacy Dicussed/Reviewed Pharmacy Topics Discussed, Pharmacy Topics Reviewed, Medication Adherence, Affording Medications, Referral to Pharmacist  Medication Adherence Not taking medication, Unable to refill medication  Referral to Pharmacist Drug interaction/side effects, Cannot afford medications  Safety Interventions   Safety Discussed/Reviewed Safety Discussed, Safety Reviewed, Fall Risk  Advanced Directive Interventions   Advanced Directives Discussed/Reviewed Advanced Directives Discussed     Active Listening & Reflection Utilized.  Verbalization of Feelings Encouraged.  Emotional Support Provided. Feelings of Frustration Validated. Problem Solving Interventions Employed. Task-Centered Solutions Indicated.  Solution-Focused Strategies Activated.  Cognitive Behavioral Therapy Initiated. Acceptance & Commitment Therapy Performed. Client-Centered Therapy Enacted. CSW Collaboration with Primary Care Provider, Dr. Assunta Found with [facility] [phone]), to Request Order for [employer]se Motorized Power Assisted Movement Leg Exercise Machine to Specifically Indicate the Following Diagnoses: Multiple Sclerosis Exacerbation, Paraplegia, Spasticity, Chronic Lower Extremity Pain & Generalized Weakness. CSW  Collaboration with Primary Care Provider, Dr. Assunta Found with [facility] [phone]), to Request Order for [facility] Motorized Power Assisted Movement Leg Exercise Machine Be Faxed to [employer] of Interest in [place]. CSW Energy manager at [employer] in [place] [phone]), to Confirm Ability to Order [employer] Power Assisted Movement Leg Exercise Machine. Please Contact CSW Directly (# [phone]), if You Have Questions, Need  Assistance, or If Additional Social Work Needs Are Identified Between Now & Our Next Scheduled Telephone Follow-Up [employer].      SDOH assessments and interventions completed:  Yes.  Care Coordination Interventions:  Yes, provided.   Follow up plan: Follow up call scheduled for 03/22/2023 at 10:45 am.  Encounter Outcome:  Pt. Visit Completed.   [name], BSW, MSW, LCSW  Licensed Restaurant manager, [facility]  Mailing [place] [address], [place], [place] [address] Physical Address[address] [place], [place] [address] Toll Free Main # [phone] Fax # [phone] Cell # [phone] [email or web address]

## 2023-03-17 ENCOUNTER — Other Ambulatory Visit: Payer: Self-pay | Admitting: Neurology

## 2023-03-22 ENCOUNTER — Encounter: Payer: Self-pay | Admitting: *Deleted

## 2023-03-22 ENCOUNTER — Ambulatory Visit: Payer: Self-pay | Admitting: *Deleted

## 2023-03-22 NOTE — Patient Outreach (Signed)
Care Coordination   Follow Up Visit Note   03/22/2023  Name: Chad Vasquez MRN: [id number] DOB: [birth year]  Chad Vasquez is a 62 y.o. year old male who sees Assunta Found, MD for primary care. I spoke with Chad Vasquez by phone today.  What matters to the patients health and wellness today?  Provide Supportive Counseling Regarding Change in Pain Medication Regimen.   Goals Addressed               This Visit's Progress     Provide Supportive Counseling Regarding Change in Pain Medication Regimen. (pt-stated)   On track     Care Coordination Interventions:  Interventions Today    Flowsheet Row Most Recent Value  Chronic Disease   Chronic disease during today's visit Hypertension (HTN), Chronic Obstructive Pulmonary Disease (COPD), Other  [Multiple Sclerosis, Paraplegia, Spasticity, Polysubstance Abuse, Generalized Weakness & Chronic Pain.]  General Interventions   General Interventions Discussed/Reviewed General Interventions Discussed, General Interventions Reviewed, Annual Eye Exam, Durable Medical Equipment (DME), Health Screening, Community Resources, Doctor Visits, Communication with  [employer]oventry Health Care Care Provider]  Doctor Visits Discussed/Reviewed Doctor Visits Discussed, Doctor Visits Reviewed, Annual Wellness Visits, PCP, Specialist  Health Screening Bone Density, Colonoscopy, Prostate  [Encouraged]  Durable Medical Equipment (DME) Bed side commode, BP Cuff, Environmental consultant, Ecologist  PCP/Specialist Visits Compliance with follow-up visit  [Encouraged]  Communication with PCP/Specialists, Charity fundraiser, Pharmacists  Exercise Interventions   Exercise Discussed/Reviewed Exercise Discussed, Exercise Reviewed, Physical Activity, Assistive device use and maintanence  [Encouraged]  Physical Activity Discussed/Reviewed Physical Activity Discussed, Physical Activity Reviewed, Types of exercise, Home Exercise Program (HEP)  [Encouraged]  Education Interventions   Education  Provided Provided Therapist, sports, Provided Education  Provided Verbal Education On Nutrition, Mental Health/Coping with Illness, Exercise, Medication, When to see the doctor, [facility], Development worker, community  Mental Health Interventions   Mental Health Discussed/Reviewed Mental Health Discussed, Mental Health Reviewed, Coping Strategies, Crisis, Suicide, Substance Abuse, Grief and Loss, Depression, Anxiety  Nutrition Interventions   Nutrition Discussed/Reviewed Nutrition Discussed, Nutrition Reviewed, Carbohydrate meal planning, Fluid intake, Portion sizes, Decreasing sugar intake, Decreasing salt, Decreasing fats, Increaing proteins  Pharmacy Interventions   Pharmacy Dicussed/Reviewed Pharmacy Topics Discussed, Pharmacy Topics Reviewed, Medication Adherence, Affording Medications, Referral to Pharmacist  Medication Adherence Not taking medication, Unable to refill medication  Referral to Pharmacist Drug interaction/side effects, Cannot afford medications  Safety Interventions   Safety Discussed/Reviewed Safety Discussed, Safety Reviewed, Fall Risk  Advanced Directive Interventions   Advanced Directives Discussed/Reviewed Advanced Directives Discussed     Active Listening & Reflection Utilized.  Verbalization of Feelings Encouraged.  Emotional Support Provided. Feelings of Contentment Validated. Problem Solving Interventions Employed. Task-Centered Solutions Indicated.  Solution-Focused Strategies Activated.  Cognitive Behavioral Therapy Initiated. Acceptance & Commitment Therapy Performed. Client-Centered Therapy Enacted. Please Continue to Implement Deep Breathing Exercises, Relaxation Techniques & Mindfulness Meditation Strategies Daily. Please Continue to [employer] Motorized Power Assisted Movement Leg Exercise Machine for Multiple Sclerosis Exacerbation, Paraplegia, Spasticity, Chronic Lower Extremity Pain & Generalized Weakness. Please Attend Follow-Up Appointment with  Dr. Despina Arias, Neurologist with [facility] [phone]), Scheduled on 03/23/2023 at 1:00 PM. Please Contact CSW Directly (# [phone]), if You Have Questions, Need Assistance, or If Additional Social Work Needs Are Identified Between Now & Our Next Scheduled Follow-Up Outreach Call.      SDOH assessments and interventions completed:  Yes.  Care Coordination Interventions:  Yes, provided.   Follow up plan: Follow up call scheduled for 04/05/2023 at  10:00 am.   Encounter Outcome:  Pt. Visit Completed.   [name], BSW, MSW, LCSW  Licensed Restaurant manager, fast food [facility]  Mailing [place] [address] [place], [place] [address] Physical Address[address], [place], [place] [address] Toll Free Main # [phone] Fax # [phone] Cell # [phone] [email or web address]

## 2023-03-22 NOTE — Patient Instructions (Signed)
Visit Information  Thank you for taking time to visit with me today. Please don't hesitate to contact me if I can be of assistance to you.   Following are the goals we discussed today:   Goals Addressed               This Visit's Progress     Provide Supportive Counseling Regarding Change in Pain Medication Regimen. (pt-stated)   On track     Care Coordination Interventions:  Interventions Today    Flowsheet Row Most Recent Value  Chronic Disease   Chronic disease during today's visit Hypertension (HTN), Chronic Obstructive Pulmonary Disease (COPD), Other  [Multiple Sclerosis, Paraplegia, Spasticity, Polysubstance Abuse, Generalized Weakness & Chronic Pain.]  General Interventions   General Interventions Discussed/Reviewed General Interventions Discussed, General Interventions Reviewed, Annual Eye Exam, Durable Medical Equipment (DME), Health Screening, Community Resources, Doctor Visits, Communication with  [facility] Care Provider]  Doctor Visits Discussed/Reviewed Doctor Visits Discussed, Doctor Visits Reviewed, Annual Wellness Visits, PCP, Specialist  Health Screening Bone Density, Colonoscopy, Prostate  [Encouraged]  Durable Medical Equipment (DME) Bed side commode, BP Cuff, Environmental consultant, Ecologist  PCP/Specialist Visits Compliance with follow-up visit  [Encouraged]  Communication with PCP/Specialists, Charity fundraiser, Pharmacists  Exercise Interventions   Exercise Discussed/Reviewed Exercise Discussed, Exercise Reviewed, Physical Activity, Assistive device use and maintanence  [Encouraged]  Physical Activity Discussed/Reviewed Physical Activity Discussed, Physical Activity Reviewed, Types of exercise, Home Exercise Program (HEP)  [Encouraged]  Education Interventions   Education Provided Provided Therapist, sports, Provided Education  Provided Verbal Education On Nutrition, Mental Health/Coping with Illness, Exercise, Medication, When to see the doctor, [employer],  Development worker, community  Mental Health Interventions   Mental Health Discussed/Reviewed Mental Health Discussed, Mental Health Reviewed, Coping Strategies, Crisis, Suicide, Substance Abuse, Grief and Loss, Depression, Anxiety  Nutrition Interventions   Nutrition Discussed/Reviewed Nutrition Discussed, Nutrition Reviewed, Carbohydrate meal planning, Fluid intake, Portion sizes, Decreasing sugar intake, Decreasing salt, Decreasing fats, Increaing proteins  Pharmacy Interventions   Pharmacy Dicussed/Reviewed Pharmacy Topics Discussed, Pharmacy Topics Reviewed, Medication Adherence, Affording Medications, Referral to Pharmacist  Medication Adherence Not taking medication, Unable to refill medication  Referral to Pharmacist Drug interaction/side effects, Cannot afford medications  Safety Interventions   Safety Discussed/Reviewed Safety Discussed, Safety Reviewed, Fall Risk  Advanced Directive Interventions   Advanced Directives Discussed/Reviewed Advanced Directives Discussed     Active Listening & Reflection Utilized.  Verbalization of Feelings Encouraged.  Emotional Support Provided. Feelings of Contentment Validated. Problem Solving Interventions Employed. Task-Centered Solutions Indicated.  Solution-Focused Strategies Activated.  Cognitive Behavioral Therapy Initiated. Acceptance & Commitment Therapy Performed. Client-Centered Therapy Enacted. Please Continue to Implement Deep Breathing Exercises, Relaxation Techniques & Mindfulness Meditation Strategies Daily. Please Continue to [employer] Motorized Power Assisted Movement Leg Exercise Machine for Multiple Sclerosis Exacerbation, Paraplegia, Spasticity, Chronic Lower Extremity Pain & Generalized Weakness. Please Attend Follow-Up Appointment with Dr. Despina Arias, Neurologist with [facility] [phone]), Scheduled on 03/23/2023 at 1:00 PM. Please Contact CSW Directly (# [phone]), if You Have Questions, Need  Assistance, or If Additional Social Work Needs Are Identified Between Now & Our Next Scheduled Follow-Up Outreach Call.      Our next appointment is by telephone on 04/05/2023 at 10:00 am.   Please call the care guide team at [phone] if you need to cancel or reschedule your appointment.   If you are experiencing a Mental Health or Behavioral Health Crisis or need someone to talk to, please call the Suicide and Crisis Lifeline: 988 call  the [place] National Suicide Prevention Lifeline: 534-627-4384 or TTY: (928)011-9121 TTY 414 124 5898) to talk to a trained counselor call 1-800-273-TALK (toll free, 24 hour hotline) go to [facility] [address], [place] [phone]) call the [facility] Crisis Line: 361-732-1831 call 911  Patient verbalizes understanding of instructions and care plan provided today and agrees to view in MyChart. Active MyChart status and patient understanding of how to access instructions and care plan via MyChart confirmed with patient.     Telephone follow up appointment with care management team member scheduled for:  04/05/2023 at 10:00 am.   [name], BSW, MSW, LCSW  Licensed Clinical Social Worker  [facility]  Mailing [place]. [address] [place], [place] [address] Physical Address-[address] [place], [place] [address] Toll Free Main # [phone] Fax # [phone] Cell # [phone] [email or web address]

## 2023-03-23 ENCOUNTER — Encounter: Payer: Self-pay | Admitting: Neurology

## 2023-03-23 ENCOUNTER — Ambulatory Visit: Payer: Medicare Other | Admitting: Neurology

## 2023-03-23 VITALS — BP 112/67 | HR 68 | Ht 75.0 in

## 2023-03-23 DIAGNOSIS — R252 Cramp and spasm: Secondary | ICD-10-CM

## 2023-03-23 DIAGNOSIS — Z79899 Other long term (current) drug therapy: Secondary | ICD-10-CM | POA: Diagnosis not present

## 2023-03-23 DIAGNOSIS — G35 Multiple sclerosis: Secondary | ICD-10-CM

## 2023-03-23 DIAGNOSIS — Z993 Dependence on wheelchair: Secondary | ICD-10-CM | POA: Diagnosis not present

## 2023-03-23 DIAGNOSIS — R5383 Other fatigue: Secondary | ICD-10-CM | POA: Diagnosis not present

## 2023-03-23 DIAGNOSIS — R3911 Hesitancy of micturition: Secondary | ICD-10-CM

## 2023-03-23 MED ORDER — ARMODAFINIL 150 MG PO TABS: 150.0000 mg | ORAL_TABLET | Freq: Every day | ORAL | 5 refills | Status: DC

## 2023-03-23 NOTE — Telephone Encounter (Signed)
Pt was seen for office visit on 03/23/23 and decided he would like to proceed with Maveclad. I provided pt will number to MS Lifelines [phone], and asked him to call to let them know he is ready to proceed. Pt verbalized he understood.

## 2023-03-23 NOTE — Progress Notes (Signed)
[facility]  PATIENT: Chad Vasquez DOB: 1960-10-10  REFERRING DOCTOR OR PCP: Assunta Found, MD SOURCE: Patient, note from neurology, imaging and lab reports, MRI images personally reviewed.  _________________________________   HISTORICAL  CHIEF COMPLAINT:  Chief Complaint  Patient presents with   Follow-up    Pt in room 10. Here for MS follow up. Pt reports being stable, pt doesn't want to start Mavenclad. Pt would like to discuss other options.    HISTORY OF PRESENT ILLNESS:  Chad Vasquez is a 62 y.o. 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  Update 03/23/2023 He feels clinically stable with no new exacerbation.We had a long discussion about DMT's and goals and options.   He is wheelchair bound but can stand up and transfer to chair, bed, tolilet.     Both legs are weak,  right > left.   He takes baclofen for spasticity.  He takes tramadol for leg pain.   He reports dysesthesias in his lower legs, right > left .  This is an aggravating pain and sometimes affects sleep.   He feels the pain deep 'i my bones'    He has urinary hesitancy.  He feels he empties.     He denies much urgency.   He has constipation.   He reports vision is fine with glasses  He has fatigue many days.  He tried to stay active and does most chores.  He even chops firewood.    He sleeps poorly some nights.  Mood is ok but sometimes gets aggravated easily (especially if ground is wet since he can't take wheelchair in yard for outdoor chores).   He denies any significant cognitive issue.    MS History: He was diagnosed with MS in 2010. He had the fairly sudden onset of left leg weakness and had a foot drop.   He was diagnosed with MS after MRI and LP..  he had increased IgG index nd> 5 OCB.   He had an AFO brace and was able to walk and do most things.   In 2013, he woke up with a high fever and he was very weak.   EMS took him to [facility].  A LP was  attempted but caused a lot of pain and he is not sure if they obtained  He saw Dr. Sandria Manly initially, then Dr. Marjory Lies then Dr. Gerilyn Pilgrim more recently and needs to chance as he retired.    He had been on multiple different medications, initially copaxone until large exacerbation in 2013 or 2014 then Tysabri in 2015 (Felt poorly and joint pain). And then on Aubagio (2016, stopped due to liver issues), Vumerity (stopped for s.e.).     He also was on Tysabri at some point.  Ocrevus had been discussed but never started. Marland Kitchen  He has had multiple rounds of IV Solumedrol and feels better a few weeks after each one.    Imaging: MRI of the brain 07/06/2021 showed multiple T2/FLAIR hyperintense foci in the periventricular, juxtacortical and deep white matter of the cerebral hemispheres, a subtle focus is noted in the left cerebellum near the fourth ventricle.  Small focus in the pons.  None of the foci enhance.  However, compared to the MRI from 04/09/2014 there has been some progression with a couple new foci in the hemispheres and enlargement of other foci.  MRI of the cervical spine 07/06/2021 shows a T2 hyperintense focus within the spinal cord towards the right adjacent  to C3-C4 measuring 15 mm in length.  There are degenerative changes at C3-C4 causing mild spinal stenosis and foraminal narrowing.  At C4-C5 there is left foraminal narrowing at C6-C7, there is bilateral foraminal narrowing.  The spinal cord is stable compared to the MRI from 2014   REVIEW OF SYSTEMS: Constitutional: No fevers, chills, sweats, or change in appetite.  He notes fatigue. Eyes: No visual changes, double vision, eye pain Ear, nose and throat: No hearing loss, ear pain, nasal congestion, sore throat Cardiovascular: No chest pain, palpitations Respiratory:  No shortness of breath at rest or with exertion.   No wheezes GastrointestinaI: No nausea, vomiting, diarrhea, abdominal pain, fecal incontinence Genitourinary: He has urinary  hesitancy.   Musculoskeletal:  No neck pain, back pain Integumentary: No rash, pruritus, skin lesions Neurological: as above Psychiatric: Mild depression.  No anxiety. Endocrine: No palpitations, diaphoresis, change in appetite, change in weigh or increased thirst Hematologic/Lymphatic:  No anemia, purpura, petechiae. Allergic/Immunologic: No itchy/runny eyes, nasal congestion, recent allergic reactions, rashes  ALLERGIES: No Known Allergies  HOME MEDICATIONS:  Current Outpatient Medications:    baclofen (LIORESAL) 20 MG tablet, Take 1 tablet (20 mg total) by mouth 3 (three) times daily., Disp: 90 each, Rfl: 11   Biotin 16109 MCG TABS, Take 50,000 mcg by mouth daily., Disp: , Rfl:    buPROPion (WELLBUTRIN XL) 150 MG 24 hr tablet, Take 150 mg by mouth daily., Disp: , Rfl:    calcium carbonate (TUMS - DOSED IN MG ELEMENTAL CALCIUM) 500 MG chewable tablet, Chew 1 tablet by mouth daily as needed for indigestion or heartburn. Reported on 02/03/2016, Disp: , Rfl:    diazepam (VALIUM) 10 MG tablet, TAKE 1 TABLET BY MOUTH 4 TIMES A DAY AS NEEDED, Disp: 120 tablet, Rfl: 2   docusate sodium (COLACE) 100 MG capsule, Take 200 mg by mouth 2 (two) times daily as needed for mild constipation. Reported on 02/03/2016, Disp: , Rfl:    losartan (COZAAR) 100 MG tablet, Take 50 mg by mouth every morning. , Disp: , Rfl:    Probiotic Product (PROBIOTIC DAILY PO), Take 1 capsule by mouth daily., Disp: , Rfl:    traMADol (ULTRAM) 50 MG tablet, TAKE TWO TABLETS BY MOUTH EVERY SIX HOURS AS NEEDED, Disp: 240 tablet, Rfl: 3   Vitamin D, Ergocalciferol, (DRISDOL) 1.25 MG (50000 UNIT) CAPS capsule, Take 1 capsule (50,000 Units total) by mouth every 7 (seven) days. Takes on Wedneday, Disp: 13 capsule, Rfl: 1   cyanocobalamin (,VITAMIN B-12,) 1000 MCG/ML injection, Inject 1,000 mcg into the muscle every 30 (thirty) days. (Patient not taking: Reported on 03/23/2023), Disp: , Rfl:    diazepam (VALIUM) 5 MG tablet, Take 1 tablet  (5 mg total) by mouth in the morning, at noon, in the evening, and at bedtime. (Patient not taking: Reported on 03/23/2023), Disp: 120 tablet, Rfl: 5   PROAIR HFA 108 (90 BASE) MCG/ACT inhaler, INHALE TWO PUFFS INTO THE LUNGS EVERY SIX HOURS AS NEEDED FOR WHEEZING (Patient not taking: Reported on 03/23/2023), Disp: 8.5 g, Rfl: 3  PAST MEDICAL HISTORY: Past Medical History:  Diagnosis Date   Anxiety and depression    Arthritis    Chronic pain    COPD (chronic obstructive pulmonary disease) (HCC)    Coronary artery disease    GERD (gastroesophageal reflux disease)    Hiatal hernia    HTN (hypertension)    Hyperlipidemia    Multiple sclerosis (HCC) 2010   Polysubstance abuse (HCC) 2013   Sarcoidosis  2013, ???   Schatzki's ring    mild   Sleep apnea    CPAP    PAST SURGICAL HISTORY: Past Surgical History:  Procedure Laterality Date   CARDIAC CATHETERIZATION  2005   CHOLECYSTECTOMY N/A 04/16/2013   Procedure: LAPAROSCOPIC CHOLECYSTECTOMY;  Surgeon: Dalia Heading, MD;  Location: [facility] ORS;  Service: General;  Laterality: N/A;   COLONOSCOPY  2011   Dr. Jena Gauss: normal   ESOPHAGOGASTRODUODENOSCOPY (EGD) WITH PROPOFOL N/A 02/16/2016   Dr.Rourk- LA grade A esophagitis, small hiatal hernia, normal second portion of the duodenum, mild schatzki ring, no specimens collected.   MALONEY DILATION N/A 02/16/2016   Procedure: Elease Hashimoto DILATION;  Surgeon: Corbin Ade, MD;  Location: [facility] SUITE;  Service: Endoscopy;  Laterality: N/A;   MEDIASTINOSCOPY  05/15/2012   Procedure: MEDIASTINOSCOPY;  Surgeon: Loreli Slot, MD;  Location: [facility] OR;  Service: Thoracic;  Laterality: N/A;    FAMILY HISTORY: Family History  Problem Relation Age of Onset   Heart failure Mother    Stroke Mother    Heart attack Mother 14   Heart failure Father    Heart attack Father 87       CABG   Breast cancer Sister    Seizures Brother    Breast cancer Sister    Liver disease Neg Hx    Colon cancer Neg Hx      SOCIAL HISTORY: Social History   Socioeconomic History   Marital status: Single    Spouse name: Not on file   Number of children: 0   Years of education: 12   Highest education level: 12th grade  Occupational History   Occupation: Engineer, water: UNEMPLOYED    Comment: disabled   Tobacco Use   Smoking status: Former    Packs/day: 1.00    Years: 30.00    Additional pack years: 0.00    Total pack years: 30.00    Types: Cigarettes    Quit date: 05/01/2012    Years since quitting: 10.8    Passive exposure: Past   Smokeless tobacco: Never   Tobacco comments:    Quit x 4 years  Vaping Use   Vaping Use: Never used  Substance and Sexual Activity   Alcohol use: No    Alcohol/week: 0.0 standard drinks of alcohol    Comment: quit 05/01/2012   Drug use: No    Types: Cocaine, Marijuana    Comment: quit 05/01/2012   Sexual activity: Not Currently    Partners: Female  Other Topics Concern   Not on file  Social History Narrative   Patient lives at home with aid.   Caffeine Use: 3 cups daily   Pt is left handed    Pt in electric wheelchair    Social Determinants of Health   Financial Resource Strain: Low Risk  (12/24/2022)   Overall Financial Resource Strain (CARDIA)    Difficulty of Paying Living Expenses: Not hard at all  Food Insecurity: No Food Insecurity (12/02/2022)   Hunger Vital Sign    Worried About Running Out of Food in the Last Year: Never true    Ran Out of Food in the Last Year: Never true  Transportation Needs: No Transportation Needs (12/24/2022)   PRAPARE - Administrator, Civil Service (Medical): No    Lack of Transportation (Non-Medical): No  Physical Activity: Inactive (12/02/2022)   Exercise Vital Sign    Days of Exercise per Week: 0 days  Minutes of Exercise per Session: 0 min  Stress: No Stress Concern Present (12/02/2022)   [employer] of Occupational Health - Occupational Stress Questionnaire    Feeling of Stress :  Only a little  Social Connections: Unknown (12/02/2022)   Social Connection and Isolation Panel [NHANES]    Frequency of Communication with Friends and Family: More than three times a week    Frequency of Social Gatherings with Friends and Family: More than three times a week    Attends Religious Services: More than 4 times per year    Active Member of [employer] or Organizations: No    Attends Banker Meetings: Never    Marital Status: Not on file  Intimate Partner Violence: Not At Risk (12/02/2022)   Humiliation, Afraid, Rape, and Kick questionnaire    Fear of Current or Ex-Partner: No    Emotionally Abused: No    Physically Abused: No    Sexually Abused: No       PHYSICAL EXAM  Vitals:   03/23/23 1302  BP: 112/67  Pulse: 68  Height: 6\' 3"  (1.905 m)    Body mass index is 30 kg/m.   General: The patient is well-developed and well-nourished and in no acute distress  HEENT:  Head is [place]/AT.  Sclera are anicteric.    Skin: Extremities are without rash or  edema.  Neurologic Exam  Mental status: The patient is alert and oriented x 3 at the time of the examination. The patient has apparent normal recent and remote memory, with an apparently normal attention span and concentration ability.   Speech is normal.  Cranial nerves: Extraocular movements are full.   Facial symmetry is present. There is good facial sensation to soft touch bilaterally.Facial strength is normal.  Trapezius and sternocleidomastoid strength is normal. No dysarthria is noted.   . No obvious hearing deficits are noted.  Motor:  Muscle bulk is normal.   Tone is increased in right leg.   5/5 in arms except 4+/5 deltoid, triceps and hand on the right, 3/5 left iliopsoas and left EHL and 4- to 4 elsewhere in leg.  Strength is 2-/5 right iliopsoas and 2+ , 3 in quads, /5 elsewhere in the right leg,    Reduced right RAM in hand.   Sensory: Sensory testing is intact to pinprick, soft touch and vibration  sensation in arms but reduced in vibration in right leg  Coordination: Cerebellar testing reveals good finger-nose-finger and heel-to-shin bilaterally.  Gait and station: Wheelchair bound  Reflexes: Deep tendon reflexes are symmetric and 3 bilaterally.        DIAGNOSTIC DATA (LABS, IMAGING, TESTING) - I reviewed patient records, labs, notes, testing and imaging myself where available.  Lab Results  Component Value Date   WBC 5.7 11/16/2022   HGB 14.2 11/16/2022   HCT 41.8 11/16/2022   MCV 92 11/16/2022   PLT 219 11/16/2022      Component Value Date/Time   NA 141 11/16/2022 1419   K 4.3 11/16/2022 1419   CL 103 11/16/2022 1419   CO2 25 11/16/2022 1419   GLUCOSE 91 11/16/2022 1419   GLUCOSE 154 (H) 07/10/2021 0622   BUN 9 11/16/2022 1419   CREATININE 0.89 11/16/2022 1419   CALCIUM 9.5 11/16/2022 1419   PROT 6.6 11/16/2022 1419   ALBUMIN 4.7 11/16/2022 1419   AST 21 11/16/2022 1419   ALT 17 11/16/2022 1419   ALKPHOS 79 11/16/2022 1419   BILITOT 0.5 11/16/2022 1419   GFRNONAA >60  07/10/2021 0622   GFRAA >60 04/29/2017 1559   Lab Results  Component Value Date   CHOL 167 07/07/2021   HDL 51 07/07/2021   LDLCALC 103 (H) 07/07/2021   TRIG 66 07/07/2021   CHOLHDL 3.3 07/07/2021   Lab Results  Component Value Date   HGBA1C 5.4 07/07/2021   Lab Results  Component Value Date   VITAMINB12 1,155 (H) 07/07/2021   Lab Results  Component Value Date   TSH 0.144 (L) 07/07/2021       ASSESSMENT AND PLAN  MS (multiple sclerosis) (HCC)  High risk medication use  Wheelchair dependence  Other fatigue  Urinary hesitancy  Spasticity   We discussed Mavenclad for his active SPMS.   He was initially interested but then decided to hold off until further discussion.  We went over the risks and benefits of proceeding with Mavenclad and he would like to proceed.  Will need to check blood in 2 months and 6 months after starting Continue baclofen and valium for  spasticity Trial of Provigil for MS related fatigue 4.   Rtc 6 months and will check CBC/D ad LFT at that [facility]   Call sooner if new or worsening issues  42-minute office visit with the majority of the time spent face-to-face for history and physical, discussion/counseling and decision-making.  Additional time with record review and documentation.  This visit is part of a comprehensive longitudinal care medical relationship regarding the patients primary diagnosis of MS and related concerns.   Mathayus Stanbery A. Epimenio Foot, MD, [facility] 03/23/2023, 1:32 PM Certified in Neurology, Clinical Neurophysiology, Sleep Medicine and Neuroimaging  [facility] [address] [place], [place] [address] [phone]

## 2023-03-24 NOTE — Telephone Encounter (Signed)
Faxed completed/signed form back to MSlifelines clearing pt to start therapy. Fax: [phone]. Received fax confirmation.

## 2023-03-24 NOTE — Telephone Encounter (Signed)
Kristen please see below.  Pt was seen for office visit on 03/23/23 and decided he would like to proceed with Maveclad. I provided pt will number to MS Lifelines [phone], and asked him to call to let them know he is ready to proceed. Pt verbalized he understood.

## 2023-03-25 ENCOUNTER — Ambulatory Visit: Payer: Self-pay | Admitting: *Deleted

## 2023-03-25 ENCOUNTER — Encounter: Payer: Self-pay | Admitting: *Deleted

## 2023-03-25 NOTE — Patient Outreach (Signed)
  Care Coordination   Follow Up Visit Note   03/25/2023 Name: MAKHARI DOVIDIO MRN: [id number] DOB: [birth year]  DEAARON FULGHUM is a 62 y.o. year old male who sees Assunta Found, MD for primary care. I spoke with  [name] by phone today.  What matters to the patients health and wellness today?  Starting new medication, Mavenclad     Goals Addressed             This Visit's Progress    Manage Multiple Sclerosis   On track    Care Coordination Goals: Patient will keep all medical appointments Patient will reach out to provider with any new or worsening symptoms Patient will continue to use assistive devices in order to continue to perform ADLs and IADLs Wheelchair, shower bench chair, grab bars around toilet Patient will reach out to Care Coordination team member with any resource or care coordination needs     Manage Neuropathy   On track    Care Coordination Goals: Patient will continue to take Lyrica at bedtime and up to 3 times a day as prescribed Patient will reach out to provider with any new or worsening symptoms Patient will maintain b12 injections Patient will continue other mediations as prescribed Patient will keep f/u appointments with providers Patient will reach out to RN Care Coordinator [phone] or other Care Coordination Team Member with any resource or care coordination needs         SDOH assessments and interventions completed:  Yes  SDOH Interventions Today    Flowsheet Row Most Recent Value  SDOH Interventions   Transportation Interventions Intervention Not Indicated  Financial Strain Interventions Intervention Not Indicated  Physical Activity Interventions Patient Declined        Care Coordination Interventions:  Yes, provided  Interventions Today    Flowsheet Row Most Recent Value  Chronic Disease   Chronic disease during today's visit Other  [MS & sarcoidosis]  General Interventions   General Interventions Discussed/Reviewed  General Interventions Discussed, General Interventions Reviewed, Durable Medical Equipment (DME), Labs, Doctor Visits  Doctor Visits Discussed/Reviewed Doctor Visits Discussed, Doctor Visits Reviewed, PCP, Specialist  Annabell Sabal and discussed recent neuro visit and follow-up]  Durable Medical Equipment (DME) Wheelchair  Wheelchair Motorized  PCP/Specialist Visits Compliance with follow-up visit  Exercise Interventions   Exercise Discussed/Reviewed Physical Activity  Physical Activity Discussed/Reviewed Physical Activity Discussed, Physical Activity Reviewed  [limited by MS. Uses wheelchair]  Education Interventions   Education Provided Provided Education  Provided Verbal Education On When to see the doctor, Mental Health/Coping with Illness, Exercise, Medication, Nutrition, Labs  Nutrition Interventions   Nutrition Discussed/Reviewed Nutrition Discussed, Nutrition Reviewed  Pharmacy Interventions   Pharmacy Dicussed/Reviewed Pharmacy Topics Discussed, Pharmacy Topics Reviewed, Medications and their functions  [Starting Mavenclad. Prescribed by neuro for MS. He is optimistic. Can continue other meds. Discussed need for blood work to check CBC and f/u with neuro while on this.]  Safety Interventions   Safety Discussed/Reviewed Safety Discussed, Safety Reviewed, Fall Risk, Home Safety  Home Safety Assistive Devices       Follow up plan: Follow up call scheduled for 05/05/23    Encounter Outcome:  Pt. Visit Completed   Demetrios Loll, BSN, RN-BC RN Care Coordinator [facility]  [facility] Direct Dial: [phone] Main #: [phone]

## 2023-03-28 ENCOUNTER — Telehealth: Payer: Self-pay | Admitting: Neurology

## 2023-03-28 NOTE — Telephone Encounter (Signed)
Noted  

## 2023-03-28 NOTE — Telephone Encounter (Signed)
[name] from [facility] called stating that the pt's Chad Vasquez has been approved for the year, 03/28/23-03/27/24.

## 2023-03-28 NOTE — Telephone Encounter (Signed)
Completed clinical questions for PA on mavenclad. Key: [id number]. Sent to [employer]. Should have a determination within 3-5 business days.

## 2023-03-28 NOTE — Telephone Encounter (Signed)
Initiated PA Mavenclad on covermymeds. Key: [id number]. Waiting on questions to become available to proceed.

## 2023-03-28 NOTE — Telephone Encounter (Signed)
Received fax from [facility] that PA approved until 03/27/24.

## 2023-03-30 DIAGNOSIS — Q232 Congenital mitral stenosis: Secondary | ICD-10-CM | POA: Diagnosis not present

## 2023-03-30 DIAGNOSIS — Z7409 Other reduced mobility: Secondary | ICD-10-CM | POA: Diagnosis not present

## 2023-03-30 DIAGNOSIS — G35 Multiple sclerosis: Secondary | ICD-10-CM | POA: Diagnosis not present

## 2023-03-30 DIAGNOSIS — R531 Weakness: Secondary | ICD-10-CM | POA: Diagnosis not present

## 2023-04-05 ENCOUNTER — Encounter (HOSPITAL_COMMUNITY): Payer: Self-pay | Admitting: Neurology

## 2023-04-05 ENCOUNTER — Other Ambulatory Visit (HOSPITAL_COMMUNITY): Payer: Self-pay

## 2023-04-05 ENCOUNTER — Telehealth: Payer: Self-pay

## 2023-04-05 ENCOUNTER — Encounter: Payer: Self-pay | Admitting: *Deleted

## 2023-04-05 ENCOUNTER — Ambulatory Visit: Payer: Self-pay | Admitting: *Deleted

## 2023-04-05 NOTE — Telephone Encounter (Signed)
Patient Advocate Encounter   Received notification from [facility] Medicare that prior authorization is required for Armodafinil 150MG  tablets   Submitted: n/a Key [id number]  Awaiting clinical questions to generate

## 2023-04-05 NOTE — Patient Outreach (Signed)
Care Coordination   Follow Up Visit Note   04/05/2023  Name: Chad Vasquez MRN: [id number] DOB: 1961-07-17  Chad Vasquez is a 62 y.o. year old male who sees Assunta Found, MD for primary care. I spoke with Chad Vasquez by phone today.  What matters to the patients health and wellness today?  Provide Supportive Counseling Regarding Change in Pain Medication Regimen.   Goals Addressed               This Visit's Progress     Provide Supportive Counseling Regarding Change in Pain Medication Regimen. (pt-stated)   On track     Care Coordination Interventions:  Interventions Today    Flowsheet Row Most Recent Value  Chronic Disease   Chronic disease during today's visit Hypertension (HTN), Chronic Obstructive Pulmonary Disease (COPD), Other  [Multiple Sclerosis, Paraplegia, Spasticity, Polysubstance Abuse, Generalized Weakness & Chronic Pain.]  General Interventions   General Interventions Discussed/Reviewed General Interventions Discussed, General Interventions Reviewed, Annual Eye Exam, Durable Medical Equipment (DME), Health Screening, Community Resources, Doctor Visits, Communication with  [employer] [facility] Care Provider]  Doctor Visits Discussed/Reviewed Doctor Visits Discussed, Doctor Visits Reviewed, Annual Wellness Visits, PCP, Specialist  Health Screening Bone Density, Colonoscopy, Prostate  [Encouraged]  Durable Medical Equipment (DME) Bed side commode, BP Cuff, Environmental consultant, Ecologist  PCP/Specialist Visits Compliance with follow-up visit  [Encouraged]  Communication with PCP/Specialists, Charity fundraiser, Pharmacists  Exercise Interventions   Exercise Discussed/Reviewed Exercise Discussed, Exercise Reviewed, Physical Activity, Assistive device use and maintanence  [Encouraged]  Physical Activity Discussed/Reviewed Physical Activity Discussed, Physical Activity Reviewed, Types of exercise, Home Exercise Program (HEP)  [Encouraged]  Education Interventions   Education  Provided Provided Therapist, sports, Provided Education  Provided Verbal Education On Nutrition, Mental Health/Coping with Illness, Exercise, Medication, When to see the doctor, [employer], Development worker, community  Mental Health Interventions   Mental Health Discussed/Reviewed Mental Health Discussed, Mental Health Reviewed, Coping Strategies, Crisis, Suicide, Substance Abuse, Grief and Loss, Depression, Anxiety  Nutrition Interventions   Nutrition Discussed/Reviewed Nutrition Discussed, Nutrition Reviewed, Carbohydrate meal planning, Fluid intake, Portion sizes, Decreasing sugar intake, Decreasing salt, Decreasing fats, Increaing proteins  Pharmacy Interventions   Pharmacy Dicussed/Reviewed Pharmacy Topics Discussed, Pharmacy Topics Reviewed, Medication Adherence, Affording Medications, Referral to Pharmacist  Medication Adherence Not taking medication, Unable to refill medication  Referral to Pharmacist Drug interaction/side effects, Cannot afford medications  Safety Interventions   Safety Discussed/Reviewed Safety Discussed, Safety Reviewed, Fall Risk  Advanced Directive Interventions   Advanced Directives Discussed/Reviewed Advanced Directives Discussed     Active Listening & Reflection Utilized.  Verbalization of Feelings Encouraged.  Emotional Support Provided. Feelings of Contentment Validated. Problem Solving Interventions Employed. Task-Centered Solutions Indicated.  Solution-Focused Strategies Activated.  Cognitive Behavioral Therapy Initiated. Acceptance & Commitment Therapy Performed. Client-Centered Therapy Enacted. Implementation of Deep Breathing Exercises, Relaxation Techniques, & Mindfulness Meditation Strategies Encouraged Daily. For Treatment of Multiple Sclerosis: Begin Mavenclad for Active Spasms, Begin Trial of Provigil for Fatigue, & Continue Baclofen & Valium for Spasticity. Continue to Increase Level of Activity & Exercise, As Tolerated. Continue to Paediatric nurse, Such as Wheelchair, Paediatric nurse, Tub Bench, & Grab Bars Around [employer], to Perform Activities of Daily Living & Prevent Risk of Falls & Injury.  Continue to [employer] Provided, to Assist with Chores You Are Unable to Perform. Please Contact CSW Directly (# [phone]), if You Have Questions, Need Assistance, or If Additional Social Work Needs Are Identified Between Now & Our Next Scheduled  Follow-Up Outreach Call.      SDOH assessments and interventions completed:  Yes.  Care Coordination Interventions:  Yes, provided.   Follow up plan: Follow up call scheduled for 04/19/2023 at 10:45 am.  Encounter Outcome:  Pt. Visit Completed.   Chad Vasquez, BSW, MSW, LCSW  Licensed Restaurant manager, fast food [facility]  Mailing [place] [address], [place], [place] [address] Physical Address[address] [place], [place] [address] Toll Free Main # [phone] Fax # [phone] Cell # [phone] [email or web address]

## 2023-04-05 NOTE — Patient Instructions (Signed)
Visit Information  Thank you for taking time to visit with me today. Please don't hesitate to contact me if I can be of assistance to you.   Following are the goals we discussed today:   Goals Addressed               This Visit's Progress     Provide Supportive Counseling Regarding Change in Pain Medication Regimen. (pt-stated)   On track     Care Coordination Interventions:  Interventions Today    Flowsheet Row Most Recent Value  Chronic Disease   Chronic disease during today's visit Hypertension (HTN), Chronic Obstructive Pulmonary Disease (COPD), Other  [Multiple Sclerosis, Paraplegia, Spasticity, Polysubstance Abuse, Generalized Weakness & Chronic Pain.]  General Interventions   General Interventions Discussed/Reviewed General Interventions Discussed, General Interventions Reviewed, Annual Eye Exam, Durable Medical Equipment (DME), Health Screening, Community Resources, Doctor Visits, Communication with  [employer] [facility] Care Care Provider]  Doctor Visits Discussed/Reviewed Doctor Visits Discussed, Doctor Visits Reviewed, Annual Wellness Visits, PCP, Specialist  Health Screening Bone Density, Colonoscopy, Prostate  [Encouraged]  Durable Medical Equipment (DME) Bed side commode, BP Cuff, Environmental consultant, Ecologist  PCP/Specialist Visits Compliance with follow-up visit  [Encouraged]  Communication with PCP/Specialists, Charity fundraiser, Pharmacists  Exercise Interventions   Exercise Discussed/Reviewed Exercise Discussed, Exercise Reviewed, Physical Activity, Assistive device use and maintanence  [Encouraged]  Physical Activity Discussed/Reviewed Physical Activity Discussed, Physical Activity Reviewed, Types of exercise, Home Exercise Program (HEP)  [Encouraged]  Education Interventions   Education Provided Provided Therapist, sports, Provided Education  Provided Verbal Education On Nutrition, Mental Health/Coping with Illness, Exercise, Medication, When to see the doctor, [employer],  Development worker, community  Mental Health Interventions   Mental Health Discussed/Reviewed Mental Health Discussed, Mental Health Reviewed, Coping Strategies, Crisis, Suicide, Substance Abuse, Grief and Loss, Depression, Anxiety  Nutrition Interventions   Nutrition Discussed/Reviewed Nutrition Discussed, Nutrition Reviewed, Carbohydrate meal planning, Fluid intake, Portion sizes, Decreasing sugar intake, Decreasing salt, Decreasing fats, Increaing proteins  Pharmacy Interventions   Pharmacy Dicussed/Reviewed Pharmacy Topics Discussed, Pharmacy Topics Reviewed, Medication Adherence, Affording Medications, Referral to Pharmacist  Medication Adherence Not taking medication, Unable to refill medication  Referral to Pharmacist Drug interaction/side effects, Cannot afford medications  Safety Interventions   Safety Discussed/Reviewed Safety Discussed, Safety Reviewed, Fall Risk  Advanced Directive Interventions   Advanced Directives Discussed/Reviewed Advanced Directives Discussed     Active Listening & Reflection Utilized.  Verbalization of Feelings Encouraged.  Emotional Support Provided. Feelings of Contentment Validated. Problem Solving Interventions Employed. Task-Centered Solutions Indicated.  Solution-Focused Strategies Activated.  Cognitive Behavioral Therapy Initiated. Acceptance & Commitment Therapy Performed. Client-Centered Therapy Enacted. Implementation of Deep Breathing Exercises, Relaxation Techniques, & Mindfulness Meditation Strategies Encouraged Daily. For Treatment of Multiple Sclerosis: Begin Mavenclad for Active Spasms, Begin Trial of Provigil for Fatigue, & Continue Baclofen & Valium for Spasticity. Continue to Increase Level of Activity & Exercise, As Tolerated. Continue to Art therapist, Such as Wheelchair, Paediatric nurse, Tub Bench, & Grab Bars Around [employer], to Perform Activities of Daily Living & Prevent Risk of Falls & Injury.  Continue to [employer] Provided, to Assist with Chores You Are Unable to Perform. Please Contact CSW Directly (# [phone]), if You Have Questions, Need Assistance, or If Additional Social Work Needs Are Identified Between Now & Our Next Scheduled Follow-Up Outreach Call.      Our next appointment is by telephone on 04/19/2023 at 10:45 am.  Please call the care guide team at [phone] if you need to  cancel or reschedule your appointment.   If you are experiencing a Mental Health or Behavioral Health Crisis or need someone to talk to, please call the Suicide and Crisis Lifeline: 988 call the [place] National Suicide Prevention Lifeline: 360-419-5761 or TTY: (862)454-3119 TTY (562)066-7731) to talk to a trained counselor call 1-800-273-TALK (toll free, 24 hour hotline) go to [facility] [address] [place] [phone]) call the [facility] Crisis Line: [phone] call 911  Patient verbalizes understanding of instructions and care plan provided today and agrees to view in MyChart. Active MyChart status and patient understanding of how to access instructions and care plan via MyChart confirmed with patient.     Telephone follow up appointment with care management team member scheduled for:  04/19/2023 at 10:45 am.  [name], BSW, MSW, LCSW  Licensed Clinical Social Worker  [facility]  Mailing [place]. [address], [place], [place] [address] Physical Address[address] [place], [place] [address] Toll Free Main # [phone] Fax # [phone] Cell # [phone] [email or web address]

## 2023-04-12 ENCOUNTER — Telehealth: Payer: Self-pay | Admitting: Neurology

## 2023-04-12 NOTE — Telephone Encounter (Signed)
I called patient to discuss. No answer, left a message asking him to call us back.  

## 2023-04-12 NOTE — Telephone Encounter (Signed)
Pt called needing to speak to the RN regarding his Mavenclad and the blood work he is needing to do. Pt states that he has a lab next to him and he is wanting to know if he can go there and the frequency he is needing to get it done. Please advise.

## 2023-04-12 NOTE — Telephone Encounter (Signed)
From the 03-23-2023. 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   He was initially interested but then decided to hold off until further discussion.  We went over the risks and benefits of proceeding with Mavenclad and he would like to proceed.  Will need to check blood in 2 months and 6 months after starting

## 2023-04-12 NOTE — Telephone Encounter (Signed)
Called Chad Vasquez and Chad Vasquez stated that he is wanting to know does he have to do his lab work before he starts the [place] or after. Told Chad Vasquez that Dr. Epimenio Foot will be notified about this manner and we will call him back. Chad Vasquez verbalized understanding.

## 2023-04-19 ENCOUNTER — Encounter: Payer: Self-pay | Admitting: *Deleted

## 2023-04-19 ENCOUNTER — Ambulatory Visit: Payer: Self-pay | Admitting: *Deleted

## 2023-04-19 NOTE — Patient Outreach (Signed)
Care Coordination   Follow Up Visit Note   04/19/2023  Name: Chad Vasquez MRN: [id number] DOB: Jan 18, 1961  Chad Vasquez is a 62 y.o. year old male who sees Chad Found, MD for primary care. I spoke with Chad Vasquez by phone today.  What matters to the patients health and wellness today?  Provide Supportive Counseling Regarding Change in Pain Medication Regimen.   Goals Addressed               This Visit's Progress     Provide Supportive Counseling Regarding Change in Pain Medication Regimen. (pt-stated)   On track     Care Coordination Interventions:  Interventions Today    Flowsheet Row Most Recent Value  Chronic Disease   Chronic disease during today's visit Hypertension (HTN), Chronic Obstructive Pulmonary Disease (COPD), Other  [Multiple Sclerosis, Paraplegia, Spasticity, Polysubstance Abuse, Generalized Weakness & Chronic Pain.]  General Interventions   General Interventions Discussed/Reviewed General Interventions Discussed, General Interventions Reviewed, Annual Eye Exam, Durable Medical Equipment (DME), Health Screening, Community Resources, Doctor Visits, Communication with  [employer] Health Care Care Provider]  Doctor Visits Discussed/Reviewed Doctor Visits Discussed, Doctor Visits Reviewed, Annual Wellness Visits, PCP, Specialist  Health Screening Bone Density, Colonoscopy, Prostate  [Encouraged]  Durable Medical Equipment (DME) Bed side commode, BP Cuff, Environmental consultant, Ecologist  PCP/Specialist Visits Compliance with follow-up visit  [Encouraged]  Communication with PCP/Specialists, Charity fundraiser, Pharmacists  Exercise Interventions   Exercise Discussed/Reviewed Exercise Discussed, Exercise Reviewed, Physical Activity, Assistive device use and maintanence  [Encouraged]  Physical Activity Discussed/Reviewed Physical Activity Discussed, Physical Activity Reviewed, Types of exercise, Home Exercise Program (HEP)  [Encouraged]  Education Interventions   Education  Provided Provided Therapist, sports, Provided Education  Provided Verbal Education On Nutrition, Mental Health/Coping with Illness, Exercise, Medication, When to see the doctor, [employer], Development worker, community  Mental Health Interventions   Mental Health Discussed/Reviewed Mental Health Discussed, Mental Health Reviewed, Coping Strategies, Crisis, Suicide, Substance Abuse, Grief and Loss, Depression, Anxiety  Nutrition Interventions   Nutrition Discussed/Reviewed Nutrition Discussed, Nutrition Reviewed, Carbohydrate meal planning, Fluid intake, Portion sizes, Decreasing sugar intake, Decreasing salt, Decreasing fats, Increaing proteins  Pharmacy Interventions   Pharmacy Dicussed/Reviewed Pharmacy Topics Discussed, Pharmacy Topics Reviewed, Medication Adherence, Affording Medications, Referral to Pharmacist  Medication Adherence Not taking medication, Unable to refill medication  Referral to Pharmacist Drug interaction/side effects, Cannot afford medications  Safety Interventions   Safety Discussed/Reviewed Safety Discussed, Safety Reviewed, Fall Risk  Advanced Directive Interventions   Advanced Directives Discussed/Reviewed Advanced Directives Discussed      Active Listening & Reflection Utilized.  Verbalization of Feelings Encouraged.  Emotional Support Provided. Feelings of Contentment Validated. Problem Solving Interventions Employed. Task-Centered Solutions Indicated.  Solution-Focused Strategies Activated.  Cognitive Behavioral Therapy Initiated. Acceptance & Commitment Therapy Performed. Client-Centered Therapy Implemented. Please Continue to Administer Medications Exactly as Prescribed. Implementation of Deep Breathing Exercises, Relaxation Techniques, & Mindfulness Meditation Strategies Encouraged Daily. Please Review Educational Material on "Techniques for Managing Stress to Help You Maintain a Positive Emotional & Spiritual Outlook on Life": ~ Eating a Healthy Diet. ~  Getting as Much Physical Activity as You Possible. ~ Avoiding Negative Coping Mechanisms, Such as Alcohol &     Substance Abuse. ~ Exploring Stress-Relief Activities Like Meditation. ~ Letting Go of Obligations That You Do Not Really Need or Want to     Do. ~ Asking for Help When You Need It. ~ Staying in Touch with Family & Friends. Please Contact CSW Directly (# [phone]), if  You Have Questions, Need Assistance, or If Additional Social Work Needs Are Identified Between Now & Our Next Scheduled Follow-Up Outreach Call.      SDOH assessments and interventions completed:  Yes.  Care Coordination Interventions:  Yes, provided.   Follow up plan: Follow up call scheduled for 05/03/2023 at 1:45 pm.   Encounter Outcome:  Pt. Visit Completed.   [name], BSW, MSW, LCSW  Licensed Restaurant manager, fast food [facility]  Mailing [place] [address] [place], [place] [address] Physical Address[address], [place], [place] [address] Toll Free Main # [phone] Fax # [phone] Cell # [phone] [email or web address]

## 2023-04-19 NOTE — Patient Instructions (Signed)
Visit Information  Thank you for taking time to visit with me today. Please don't hesitate to contact me if I can be of assistance to you.   Following are the goals we discussed today:   Goals Addressed               This Visit's Progress     Provide Supportive Counseling Regarding Change in Pain Medication Regimen. (pt-stated)   On track     Care Coordination Interventions:  Interventions Today    Flowsheet Row Most Recent Value  Chronic Disease   Chronic disease during today's visit Hypertension (HTN), Chronic Obstructive Pulmonary Disease (COPD), Other  [Multiple Sclerosis, Paraplegia, Spasticity, Polysubstance Abuse, Generalized Weakness & Chronic Pain.]  General Interventions   General Interventions Discussed/Reviewed General Interventions Discussed, General Interventions Reviewed, Annual Eye Exam, Durable Medical Equipment (DME), Health Screening, Community Resources, Doctor Visits, Communication with  [employer] Health Care Care Provider]  Doctor Visits Discussed/Reviewed Doctor Visits Discussed, Doctor Visits Reviewed, Annual Wellness Visits, PCP, Specialist  Health Screening Bone Density, Colonoscopy, Prostate  [Encouraged]  Durable Medical Equipment (DME) Bed side commode, BP Cuff, Environmental consultant, Ecologist  PCP/Specialist Visits Compliance with follow-up visit  [Encouraged]  Communication with PCP/Specialists, Charity fundraiser, Pharmacists  Exercise Interventions   Exercise Discussed/Reviewed Exercise Discussed, Exercise Reviewed, Physical Activity, Assistive device use and maintanence  [Encouraged]  Physical Activity Discussed/Reviewed Physical Activity Discussed, Physical Activity Reviewed, Types of exercise, Home Exercise Program (HEP)  [Encouraged]  Education Interventions   Education Provided Provided Therapist, sports, Provided Education  Provided Verbal Education On Nutrition, Mental Health/Coping with Illness, Exercise, Medication, When to see the doctor, [employer],  Development worker, community  Mental Health Interventions   Mental Health Discussed/Reviewed Mental Health Discussed, Mental Health Reviewed, Coping Strategies, Crisis, Suicide, Substance Abuse, Grief and Loss, Depression, Anxiety  Nutrition Interventions   Nutrition Discussed/Reviewed Nutrition Discussed, Nutrition Reviewed, Carbohydrate meal planning, Fluid intake, Portion sizes, Decreasing sugar intake, Decreasing salt, Decreasing fats, Increaing proteins  Pharmacy Interventions   Pharmacy Dicussed/Reviewed Pharmacy Topics Discussed, Pharmacy Topics Reviewed, Medication Adherence, Affording Medications, Referral to Pharmacist  Medication Adherence Not taking medication, Unable to refill medication  Referral to Pharmacist Drug interaction/side effects, Cannot afford medications  Safety Interventions   Safety Discussed/Reviewed Safety Discussed, Safety Reviewed, Fall Risk  Advanced Directive Interventions   Advanced Directives Discussed/Reviewed Advanced Directives Discussed      Active Listening & Reflection Utilized.  Verbalization of Feelings Encouraged.  Emotional Support Provided. Feelings of Contentment Validated. Problem Solving Interventions Employed. Task-Centered Solutions Indicated.  Solution-Focused Strategies Activated.  Cognitive Behavioral Therapy Initiated. Acceptance & Commitment Therapy Performed. Client-Centered Therapy Implemented. Please Continue to Administer Medications Exactly as Prescribed. Implementation of Deep Breathing Exercises, Relaxation Techniques, & Mindfulness Meditation Strategies Encouraged Daily. Please Review Educational Material on "Techniques for Managing Stress to Help You Maintain a Positive Emotional & Spiritual Outlook on Life": ~ Eating a Healthy Diet. ~ Getting as Much Physical Activity as You Possible. ~ Avoiding Negative Coping Mechanisms, Such as Alcohol & Substance Abuse. ~ Exploring Stress-Relief Activities Like Meditation. ~ Letting Go of  Obligations That You Do Not Really Need or Want to Do. ~ Asking for Help When You Need It. ~ Staying in Touch with Family & Friends. Please Contact CSW Directly (# [phone]), if You Have Questions, Need Assistance, or If Additional Social Work Needs Are Identified Between Now & Our Next Scheduled Follow-Up Outreach Call.      Our next appointment is by telephone on 05/03/2023 at 1:45 pm.  Please  call the care guide team at [phone] if you need to cancel or reschedule your appointment.   If you are experiencing a Mental Health or Behavioral Health Crisis or need someone to talk to, please call the Suicide and Crisis Lifeline: 988 call the [place] National Suicide Prevention Lifeline: 531-066-6385 or TTY: 956-604-0839 TTY 321-275-2237) to talk to a trained counselor call 1-800-273-TALK (toll free, 24 hour hotline) go to [facility]gent Care [address] [place] [phone]) call the [facility] Crisis Line: [phone] call 911  Patient verbalizes understanding of instructions and care plan provided today and agrees to view in MyChart. Active MyChart status and patient understanding of how to access instructions and care plan via MyChart confirmed with patient.     Telephone follow up appointment with care management team member scheduled for:  05/03/2023 at 1:45 pm.  [name], BSW, MSW, LCSW  Licensed Clinical Social Worker  [facility]  Mailing [place]. [address], [place], [place] [address] Physical Address[address], [place], [place] [address] Toll Free Main # [phone] Fax # [phone] Cell # [phone] [email or web address]

## 2023-04-19 NOTE — Telephone Encounter (Signed)
I called pt, he has not started [facility] yet.  He says he will start soon.  Tinnie Gens is the Rn whom will come to his home to show him how to take it.  He will notify us when takes it.

## 2023-04-20 ENCOUNTER — Other Ambulatory Visit: Payer: Self-pay

## 2023-04-26 NOTE — Telephone Encounter (Signed)
Pt has called to report that he has not been feeling well as to why he has not started the [facility] just yet.  Pt would like to discuss this with RN 1st, please call.

## 2023-04-26 NOTE — Telephone Encounter (Signed)
Called patient and spoke to him about how he has been feeling, he states he is having weakness, stand up straight he gets dizzy and light headed, he has been sleeping more than usual and cannot seem to get rid of them. He was wanting to make sure he was still okay to start the medication mavenclad. Please advise on next steps.

## 2023-04-27 NOTE — Telephone Encounter (Signed)
Called and spoke to pt and he voiced understanding that as long as he don't have infections its safe to take the maven clad and he voiced understanding

## 2023-05-03 ENCOUNTER — Ambulatory Visit: Payer: Self-pay | Admitting: *Deleted

## 2023-05-03 ENCOUNTER — Encounter: Payer: Self-pay | Admitting: *Deleted

## 2023-05-03 NOTE — Patient Instructions (Signed)
Visit Information  Thank you for taking time to visit with me today. Please don't hesitate to contact me if I can be of assistance to you.   Following are the goals we discussed today:   Goals Addressed               This Visit's Progress     Provide Supportive Counseling Regarding Change in Pain Medication Regimen. (pt-stated)   On track     Care Coordination Interventions:  Interventions Today    Flowsheet Row Most Recent Value  Chronic Disease   Chronic disease during today's visit Hypertension (HTN), Chronic Obstructive Pulmonary Disease (COPD), Other  [Multiple Sclerosis, Paraplegia, Spasticity, Polysubstance Abuse, Generalized Weakness & Chronic Pain.]  General Interventions   General Interventions Discussed/Reviewed General Interventions Discussed, General Interventions Reviewed, Annual Eye Exam, Durable Medical Equipment (DME), Health Screening, Community Resources, Doctor Visits, Communication with  [facility] Care Provider]  Doctor Visits Discussed/Reviewed Doctor Visits Discussed, Doctor Visits Reviewed, Annual Wellness Visits, PCP, Specialist  Health Screening Bone Density, Colonoscopy, Prostate  [Encouraged]  Durable Medical Equipment (DME) Bed side commode, BP Cuff, Environmental consultant, Ecologist  PCP/Specialist Visits Compliance with follow-up visit  [Encouraged]  Communication with PCP/Specialists, Charity fundraiser, Pharmacists  Exercise Interventions   Exercise Discussed/Reviewed Exercise Discussed, Exercise Reviewed, Physical Activity, Assistive device use and maintanence  [Encouraged]  Physical Activity Discussed/Reviewed Physical Activity Discussed, Physical Activity Reviewed, Types of exercise, Home Exercise Program (HEP)  [Encouraged]  Education Interventions   Education Provided Provided Therapist, sports, Provided Education  Provided Verbal Education On Nutrition, Mental Health/Coping with Illness, Exercise, Medication, When to see the doctor, [employer],  Development worker, community  Mental Health Interventions   Mental Health Discussed/Reviewed Mental Health Discussed, Mental Health Reviewed, Coping Strategies, Crisis, Suicide, Substance Abuse, Grief and Loss, Depression, Anxiety  Nutrition Interventions   Nutrition Discussed/Reviewed Nutrition Discussed, Nutrition Reviewed, Carbohydrate meal planning, Fluid intake, Portion sizes, Decreasing sugar intake, Decreasing salt, Decreasing fats, Increaing proteins  Pharmacy Interventions   Pharmacy Dicussed/Reviewed Pharmacy Topics Discussed, Pharmacy Topics Reviewed, Medication Adherence, Affording Medications, Referral to Pharmacist  Medication Adherence Not taking medication, Unable to refill medication  Referral to Pharmacist Drug interaction/side effects, Cannot afford medications  Safety Interventions   Safety Discussed/Reviewed Safety Discussed, Safety Reviewed, Fall Risk  Advanced Directive Interventions   Advanced Directives Discussed/Reviewed Advanced Directives Discussed      Active Listening & Reflection Utilized.  Verbalization of Feelings Encouraged.  Emotional Support Provided. Feelings of Contentment Validated. Problem Solving Interventions Employed. Task-Centered Solutions Indicated.  Solution-Focused Strategies Activated.  Cognitive Behavioral Therapy Initiated. Acceptance & Commitment Therapy Performed. Client-Centered Therapy Implemented. CSW Collaboration with Dr. Despina Arias, Neurologist with [facility], Via Routed Note in Epic, to Report Success with First Treatment of Mavenclad (Cladribine), Per Your Request.  Encouraged Administration of Medications, Exactly as Prescribed. Encouraged Increased Level of Activity & Exercise & Routine Use of Elliptical Exercise Machine. Encouraged Implementation of Deep Breathing Exercises, Relaxation Techniques, & Mindfulness Meditation Strategies Daily. Encouraged Implementation of "Techniques for Managing Stress to Help You  Maintain a Positive Emotional & Spiritual Outlook on Life", Which Include All of The Following: ~ Eating a Healthy Diet. ~ Getting as Much Physical Activity as You Possible. ~ Avoiding Negative Coping Mechanisms, Such as Alcohol &     Substance Abuse. ~ Exploring Stress-Relief Activities Like Meditation. ~ Letting Go of Obligations That You Do Not Really Need or Want to     Do. ~ Asking for Help When You Need It. ~  Staying in Touch with Family & Friends. Encouraged Contact with CSW (# [phone]), if You Have Questions, Need Assistance, or If Additional Social Work Needs Are Identified Between Now & Our Next Scheduled Follow-Up Outreach Call.      Our next appointment is by telephone on 05/31/2023 at 12:30 pm.  Please call the care guide team at [phone] if you need to cancel or reschedule your appointment.   If you are experiencing a Mental Health or Behavioral Health Crisis or need someone to talk to, please call the Suicide and Crisis Lifeline: 988 call the [place] National Suicide Prevention Lifeline: (316)377-3192 or TTY: (613)856-7522 TTY 757-333-5750) to talk to a trained counselor call 1-800-273-TALK (toll free, 24 hour hotline) go to [facility] [address] [place] [phone]) call the [facility] Crisis Line: [phone] call 911  Patient verbalizes understanding of instructions and care plan provided today and agrees to view in MyChart. Active MyChart status and patient understanding of how to access instructions and care plan via MyChart confirmed with patient.     Telephone follow up appointment with care management team member scheduled for:  05/31/2023 at 12:30 pm.  [name], BSW, MSW, LCSW  Licensed Clinical Social Worker  [facility]  Mailing [place]. [address], [place], [place] [address] Physical Address-[address] [place], [place] [address] Toll  Free Main # [phone] Fax # [phone] Cell # [phone] [email or web address]

## 2023-05-03 NOTE — Patient Outreach (Signed)
Care Coordination   Follow Up Visit Note   05/03/2023  Name: Chad Vasquez MRN: [id number] DOB: 03/21/1961  Chad Vasquez is a 62 y.o. year old male who sees Chad Found, MD for primary care. I spoke with Chad Vasquez by phone today.  What matters to the patients health and wellness today?  Provide Supportive Counseling Regarding Change in Pain Medication Regimen.   Goals Addressed               This Visit's Progress     Provide Supportive Counseling Regarding Change in Pain Medication Regimen. (pt-stated)   On track     Care Coordination Interventions:  Interventions Today    Flowsheet Row Most Recent Value  Chronic Disease   Chronic disease during today's visit Hypertension (HTN), Chronic Obstructive Pulmonary Disease (COPD), Other  [Multiple Sclerosis, Paraplegia, Spasticity, Polysubstance Abuse, Generalized Weakness & Chronic Pain.]  General Interventions   General Interventions Discussed/Reviewed General Interventions Discussed, General Interventions Reviewed, Annual Eye Exam, Durable Medical Equipment (DME), Health Screening, Community Resources, Doctor Visits, Communication with  [facility] Care Provider]  Doctor Visits Discussed/Reviewed Doctor Visits Discussed, Doctor Visits Reviewed, Annual Wellness Visits, PCP, Specialist  Health Screening Bone Density, Colonoscopy, Prostate  [Encouraged]  Durable Medical Equipment (DME) Bed side commode, BP Cuff, Environmental consultant, Ecologist  PCP/Specialist Visits Compliance with follow-up visit  [Encouraged]  Communication with PCP/Specialists, Charity fundraiser, Pharmacists  Exercise Interventions   Exercise Discussed/Reviewed Exercise Discussed, Exercise Reviewed, Physical Activity, Assistive device use and maintanence  [Encouraged]  Physical Activity Discussed/Reviewed Physical Activity Discussed, Physical Activity Reviewed, Types of exercise, Home Exercise Program (HEP)  [Encouraged]  Education Interventions   Education  Provided Provided Therapist, sports, Provided Education  Provided Verbal Education On Nutrition, Mental Health/Coping with Illness, Exercise, Medication, When to see the doctor, [employer], Development worker, community  Mental Health Interventions   Mental Health Discussed/Reviewed Mental Health Discussed, Mental Health Reviewed, Coping Strategies, Crisis, Suicide, Substance Abuse, Grief and Loss, Depression, Anxiety  Nutrition Interventions   Nutrition Discussed/Reviewed Nutrition Discussed, Nutrition Reviewed, Carbohydrate meal planning, Fluid intake, Portion sizes, Decreasing sugar intake, Decreasing salt, Decreasing fats, Increaing proteins  Pharmacy Interventions   Pharmacy Dicussed/Reviewed Pharmacy Topics Discussed, Pharmacy Topics Reviewed, Medication Adherence, Affording Medications, Referral to Pharmacist  Medication Adherence Not taking medication, Unable to refill medication  Referral to Pharmacist Drug interaction/side effects, Cannot afford medications  Safety Interventions   Safety Discussed/Reviewed Safety Discussed, Safety Reviewed, Fall Risk  Advanced Directive Interventions   Advanced Directives Discussed/Reviewed Advanced Directives Discussed      Active Listening & Reflection Utilized.  Verbalization of Feelings Encouraged.  Emotional Support Provided. Feelings of Contentment Validated. Problem Solving Interventions Employed. Task-Centered Solutions Indicated.  Solution-Focused Strategies Activated.  Cognitive Behavioral Therapy Initiated. Acceptance & Commitment Therapy Performed. Client-Centered Therapy Implemented. CSW Collaboration with Dr. Despina Arias, Neurologist with [facility], Via Routed Note in Epic, to Report Success with First Treatment of Mavenclad (Cladribine), Per Your Request.  Encouraged Administration of Medications, Exactly as Prescribed. Encouraged Increased Level of Activity & Exercise & Routine Use of Elliptical Exercise  Machine. Encouraged Implementation of Deep Breathing Exercises, Relaxation Techniques, & Mindfulness Meditation Strategies Daily. Encouraged Implementation of "Techniques for Managing Stress to Help You Maintain a Positive Emotional & Spiritual Outlook on Life", Which Include All of The Following: ~ Eating a Healthy Diet. ~ Getting as Much Physical Activity as You Possible. ~ Avoiding Negative Coping Mechanisms, Such as Alcohol &     Substance Abuse. ~ Exploring  Stress-Relief Activities Like Meditation. ~ Letting Go of Obligations That You Do Not Really Need or Want to     Do. ~ Asking for Help When You Need It. ~ Staying in Touch with Family & Friends. Encouraged Contact with CSW (# [phone]), if You Have Questions, Need Assistance, or If Additional Social Work Needs Are Identified Between Now & Our Next Scheduled Follow-Up Outreach Call.      SDOH assessments and interventions completed:  Yes.  Care Coordination Interventions:  Yes, provided.   Follow up plan: Follow up call scheduled for 05/31/2023 at 12:30 pm.   Encounter Outcome:  Pt. Visit Completed.   Chad Vasquez, BSW, MSW, LCSW  Licensed Restaurant manager, [facility]  Mailing [place] [address], [place], [place] [address] Physical Address[address], [place], [place] [address] Toll Free Main # [phone] Fax # [phone] Cell # [phone] [email or web address]

## 2023-05-04 ENCOUNTER — Other Ambulatory Visit (HOSPITAL_COMMUNITY): Payer: Self-pay

## 2023-05-05 ENCOUNTER — Ambulatory Visit: Payer: Self-pay | Admitting: *Deleted

## 2023-05-05 ENCOUNTER — Encounter: Payer: Self-pay | Admitting: *Deleted

## 2023-05-05 ENCOUNTER — Other Ambulatory Visit: Payer: Self-pay | Admitting: Orthopaedic Surgery

## 2023-05-05 NOTE — Patient Outreach (Signed)
  Care Coordination   05/05/2023 Name: Chad Vasquez MRN: [id number] DOB: Feb 24, 1961   Care Coordination Outreach Attempts:  An unsuccessful telephone outreach was attempted for a scheduled appointment today.  Follow Up Plan:  Additional outreach attempts will be made to offer the patient care coordination information and services.   Encounter Outcome:  No Answer   Care Coordination Interventions:  No, not indicated    Demetrios Loll, BSN, RN-BC RN Care Coordinator [facility]  [facility] Direct Dial: [phone] Main #: [phone]

## 2023-05-05 NOTE — Patient Outreach (Signed)
Care Coordination   Follow Up Visit Note   05/05/2023 Name: Chad Vasquez MRN: [id number] DOB: 12/26/1960  Chad Vasquez is a 62 y.o. year old male who sees Assunta Found, MD for primary care. I spoke with  Chad Vasquez by phone today.  What matters to the patients health and wellness today?  Continue to increase mobility and work on debt relief     Goals Addressed             This Visit's Progress    Care Coordination Services (Financial)       Care Coordination Goals: Patient will reach out to [facility] Service of the [place] at [phone] to discuss debt relief  Patient will reach out to Care Management team with any resource or care coordination needs (RN Care Coordinator [phone])     Manage Multiple Sclerosis   On track    Care Coordination Goals: Patient will keep all medical appointments Patient will reach out to provider with any new or worsening symptoms Patient will continue to use assistive devices in order to continue to perform ADLs and IADLs Wheelchair, shower bench chair, grab bars around toilet Patient will continue daily exercise with seated elliptical machine Patient will reach out to Care Coordination team member with any resource or care coordination needs     Manage Neuropathy   On track    Care Coordination Goals: Patient will continue to take Lyrica at bedtime and up to 3 times a day as prescribed Patient will reach out to provider with any new or worsening symptoms Patient will maintain b12 injections Patient will continue other mediations as prescribed Patient will keep f/u appointments with providers Patient will continue to use seated elliptical daily to improve strength and mobility Patient will reach out to RN Care Coordinator [phone] or other Care Coordination Team Member with any resource or care coordination needs         SDOH assessments and interventions completed:  Yes  SDOH Interventions Today    Flowsheet Row Most  Recent Value  SDOH Interventions   Transportation Interventions Intervention Not Indicated  Physical Activity Interventions Intervention Not Indicated        Care Coordination Interventions:  Yes, provided  Interventions Today    Flowsheet Row Most Recent Value  Chronic Disease   Chronic disease during today's visit Other  [Multiple Sclerosis]  General Interventions   General Interventions Discussed/Reviewed General Interventions Discussed, General Interventions Reviewed, Labs, Doctor Visits, Communication with  Labs --  [needs CBC to monitor blood clound due to being on [facility] for MS]  Doctor Visits Discussed/Reviewed Doctor Visits Discussed, Doctor Visits Reviewed, Specialist, PCP  PCP/Specialist Visits Compliance with follow-up visit  Communication with Social Work  [[place] [employer] regarding debt relief resources. Connected with care guides who provided information on [facility] the [place] [phone]. Passed this information on to patient.]  Exercise Interventions   Exercise Discussed/Reviewed Exercise Discussed, Exercise Reviewed, Physical Activity  Physical Activity Discussed/Reviewed Physical Activity Discussed, Physical Activity Reviewed, Home Exercise Program (HEP)  [Uses a seated eliptical daily for 30 minutes. Working up to 1 hour per day. Has noticed improvement in legs and feet with increased activity. Increased strength, stability, and mobility.]  Education Interventions   Education Provided Provided Education  Provided Verbal Education On Exercise, When to see the doctor, Medication  Nutrition Interventions   Nutrition Discussed/Reviewed Nutrition Discussed, Nutrition Reviewed, Portion sizes, Increasing proteins, Adding fruits and vegetables, Fluid intake  Pharmacy Interventions  Pharmacy Dicussed/Reviewed Pharmacy Topics Discussed, Pharmacy Topics Reviewed, Medications and their functions  [Taking Mavenclad. Will likely have CBC prior to next refill. Has  noticed improved energy and feels better overall.]  Safety Interventions   Safety Discussed/Reviewed Safety Discussed, Safety Reviewed, Fall Risk, Home Safety  Home Safety Assistive Devices  [wheelchair, grab bars around toilet]       Follow up plan: Follow up call scheduled for 06/02/23    Encounter Outcome:  Pt. Visit Completed   Demetrios Loll, BSN, RN-BC RN Care Coordinator [facility]  [facility] Direct Dial: [phone] Main #: [phone]

## 2023-05-09 ENCOUNTER — Telehealth: Payer: Self-pay | Admitting: Neurology

## 2023-05-09 NOTE — Telephone Encounter (Signed)
Pt called and LVM stating that he is about to start the second dose of the Mavenclasd on the 24th of Aug and he would like to know if he is to get lab work in between doses. Please advise.

## 2023-05-09 NOTE — Telephone Encounter (Signed)
Patient will be due for labs on 08.19.24, two months following his last visit per note in chart from 06.19.24

## 2023-05-10 ENCOUNTER — Telehealth: Payer: Self-pay

## 2023-05-10 ENCOUNTER — Other Ambulatory Visit (HOSPITAL_COMMUNITY): Payer: Self-pay

## 2023-05-10 NOTE — Telephone Encounter (Signed)
[employer] ([employer])Has been approved 05/09/23-05/08/24

## 2023-05-10 NOTE — Telephone Encounter (Signed)
Patient called in and I relayed the information regarding lab work being needed. He asked if labs can be done in [place]. After speaking with Florentina Addison RN, I advised that he can go to any [facility] location for those, or he can come to our office to have them done. He expressed appreciation

## 2023-05-10 NOTE — Telephone Encounter (Signed)
Pharmacy Patient Advocate Encounter   Received notification from CoverMyMeds that prior authorization for Armodafinil 150MG  tablets is required/requested.   Insurance verification completed.   The patient is insured through [facility] .   Per test claim: PA required; PA submitted to [facility] via CoverMyMeds Key/confirmation #/EOC [id number] Status is pending

## 2023-05-10 NOTE — Telephone Encounter (Signed)
Patient did not answer, Called to let know about labs, if patient returns call, please inform him labs are needed

## 2023-05-23 DIAGNOSIS — G35 Multiple sclerosis: Secondary | ICD-10-CM | POA: Diagnosis not present

## 2023-05-23 DIAGNOSIS — Z79899 Other long term (current) drug therapy: Secondary | ICD-10-CM | POA: Diagnosis not present

## 2023-05-24 ENCOUNTER — Telehealth: Payer: Self-pay

## 2023-05-24 LAB — COMPREHENSIVE METABOLIC PANEL
ALT: 30 IU/L (ref 0–44)
Albumin: 4.3 g/dL (ref 3.9–4.9)
Alkaline Phosphatase: 72 IU/L (ref 44–121)
Chloride: 104 mmol/L (ref 96–106)
Creatinine, Ser: 0.85 mg/dL (ref 0.76–1.27)
Glucose: 88 mg/dL (ref 70–99)
Sodium: 141 mmol/L (ref 134–144)
eGFR: 97 mL/min/{1.73_m2} (ref >59)

## 2023-05-24 LAB — CBC WITH DIFFERENTIAL/PLATELET
Basos: 1 %
Hemoglobin: 13.5 g/dL (ref 13.0–17.7)
Monocytes: 18 %
Neutrophils: 57 %

## 2023-05-24 NOTE — Telephone Encounter (Signed)
I called patient to discuss. No answer, left a message asking him to call us back.  Also need to confirm the dates of his first year of Mavenclad.

## 2023-05-24 NOTE — Telephone Encounter (Signed)
-----   Message from Asa Lente sent at 05/24/2023  8:27 AM EDT ----- Please let the patient know that the lab work is fine.

## 2023-05-24 NOTE — Telephone Encounter (Signed)
Patient returned my call.  I advised him that his lab work was fine. He reports that he started Y1M1 of Mavenclad on 04/30/2023. He tolerated it well. He has Y1M2 Mavenclad on hand and should take it at the end of this month. Patient has an upcoming appointment with Dr. Epimenio Foot in January but will let us know of upcoming questions or concerns

## 2023-05-31 ENCOUNTER — Encounter: Payer: Self-pay | Admitting: *Deleted

## 2023-05-31 ENCOUNTER — Ambulatory Visit: Payer: Self-pay | Admitting: *Deleted

## 2023-05-31 NOTE — Patient Instructions (Signed)
Visit Information  Thank you for taking time to visit with me today. Please don't hesitate to contact me if I can be of assistance to you.   Following are the goals we discussed today:   Goals Addressed               This Visit's Progress     Provide Supportive Counseling Regarding Change in Pain Medication Regimen. (pt-stated)   On track     Care Coordination Interventions:  Interventions Today    Flowsheet Row Most Recent Value  Chronic Disease   Chronic disease during today's visit Hypertension (HTN), Chronic Obstructive Pulmonary Disease (COPD), Other  [Multiple Sclerosis, Paraplegia, Spasticity, Polysubstance Abuse, Generalized Weakness & Chronic Pain.]  General Interventions   General Interventions Discussed/Reviewed General Interventions Discussed, General Interventions Reviewed, Annual Eye Exam, Durable Medical Equipment (DME), Health Screening, Community Resources, Doctor Visits, Communication with  [facility] Care Provider]  Doctor Visits Discussed/Reviewed Doctor Visits Discussed, Doctor Visits Reviewed, Annual Wellness Visits, PCP, Specialist  Health Screening Bone Density, Colonoscopy, Prostate  [Encouraged]  Durable Medical Equipment (DME) Bed side commode, BP Cuff, Environmental consultant, Ecologist  PCP/Specialist Visits Compliance with follow-up visit  [Encouraged]  Communication with PCP/Specialists, Charity fundraiser, Pharmacists  Exercise Interventions   Exercise Discussed/Reviewed Exercise Discussed, Exercise Reviewed, Physical Activity, Assistive device use and maintanence  [Encouraged]  Physical Activity Discussed/Reviewed Physical Activity Discussed, Physical Activity Reviewed, Types of exercise, Home Exercise Program (HEP)  [Encouraged]  Education Interventions   Education Provided Provided Therapist, sports, Provided Education  Provided Verbal Education On Nutrition, Mental Health/Coping with Illness, Exercise, Medication, When to see the doctor, [employer],  Development worker, community  Mental Health Interventions   Mental Health Discussed/Reviewed Mental Health Discussed, Mental Health Reviewed, Coping Strategies, Crisis, Suicide, Substance Abuse, Grief and Loss, Depression, Anxiety  Nutrition Interventions   Nutrition Discussed/Reviewed Nutrition Discussed, Nutrition Reviewed, Carbohydrate meal planning, Fluid intake, Portion sizes, Decreasing sugar intake, Decreasing salt, Decreasing fats, Increaing proteins  Pharmacy Interventions   Pharmacy Dicussed/Reviewed Pharmacy Topics Discussed, Pharmacy Topics Reviewed, Medication Adherence, Affording Medications, Referral to Pharmacist  Medication Adherence Not taking medication, Unable to refill medication  Referral to Pharmacist Drug interaction/side effects, Cannot afford medications  Safety Interventions   Safety Discussed/Reviewed Safety Discussed, Safety Reviewed, Fall Risk  Advanced Directive Interventions   Advanced Directives Discussed/Reviewed Advanced Directives Discussed      Active Listening & Reflection Utilized.  Verbalization of Feelings Encouraged.  Emotional Support Provided. Feelings of Contentment Validated. Problem Solving Interventions Employed. Task-Centered Solutions Indicated.  Solution-Focused Strategies Activated.  Cognitive Behavioral Therapy Initiated. Acceptance & Commitment Therapy Performed. Client-Centered Therapy Implemented. Encouraged Administration of Medications, Exactly as Prescribed. Encouraged Increased Level of Activity & Exercise & Routine Use of Elliptical Exercise Machine, as Tolerated. Encouraged Implementation of Deep Breathing Exercises, Relaxation Techniques, & Mindfulness Meditation Strategies Daily. Encouraged Contact with CSW (# [phone]), if You Have Questions, Need Assistance, or If Additional Social Work Needs Are Identified Between Now & Our Next Follow-Up Outreach Call, Scheduled on 06/15/2023 at 10:30 AM.      Our next appointment is by  telephone on 06/15/2023 at 10:30 am.  Please call the care guide team at [phone] if you need to cancel or reschedule your appointment.   If you are experiencing a Mental Health or Behavioral Health Crisis or need someone to talk to, please call the Suicide and Crisis Lifeline: 988 call the [place] National Suicide Prevention Lifeline: 513-245-5694 or TTY: 225-398-8596 TTY 561 108 9553) to talk to a trained counselor call  1-800-273-TALK (toll free, 24 hour hotline) go to [facility] Urgent [facility] [address], [place] [phone]) call the [facility] Crisis Line: [phone] call 911  Patient verbalizes understanding of instructions and care plan provided today and agrees to view in MyChart. Active MyChart status and patient understanding of how to access instructions and care plan via MyChart confirmed with patient.     Telephone follow up appointment with care management team member scheduled for:  06/15/2023 at 10:30 am.  [name], BSW, MSW, LCSW  Embedded Practice Social Work Case Manager  [facility], Population Health Direct Dial: [phone]  Fax: [phone] Email: [email or web address]: [email or web address]

## 2023-05-31 NOTE — Patient Outreach (Signed)
Care Coordination   Follow Up Visit Note   05/31/2023  Name: Chad Vasquez MRN: [id number] DOB: September 10, 1961  Chad Vasquez is a 62 y.o. year old male who sees Assunta Found, MD for primary care. [name]  What matters to the patients health and wellness today?  Provide Supportive Counseling Regarding Change in Pain Medication Regimen.   Goals Addressed               This Visit's Progress     Provide Supportive Counseling Regarding Change in Pain Medication Regimen. (pt-stated)   On track     Care Coordination Interventions:  Interventions Today    Flowsheet Row Most Recent Value  Chronic Disease   Chronic disease during today's visit Hypertension (HTN), Chronic Obstructive Pulmonary Disease (COPD), Other  [Multiple Sclerosis, Paraplegia, Spasticity, Polysubstance Abuse, Generalized Weakness & Chronic Pain.]  General Interventions   General Interventions Discussed/Reviewed General Interventions Discussed, General Interventions Reviewed, Annual Eye Exam, Durable Medical Equipment (DME), Health Screening, Community Resources, Doctor Visits, Communication with  [facility] Care Provider]  Doctor Visits Discussed/Reviewed Doctor Visits Discussed, Doctor Visits Reviewed, Annual Wellness Visits, PCP, Specialist  Health Screening Bone Density, Colonoscopy, Prostate  [Encouraged]  Durable Medical Equipment (DME) Bed side commode, BP Cuff, Environmental consultant, Ecologist  PCP/Specialist Visits Compliance with follow-up visit  [Encouraged]  Communication with PCP/Specialists, Charity fundraiser, Pharmacists  Exercise Interventions   Exercise Discussed/Reviewed Exercise Discussed, Exercise Reviewed, Physical Activity, Assistive device use and maintanence  [Encouraged]  Physical Activity Discussed/Reviewed Physical Activity Discussed, Physical Activity Reviewed, Types of exercise, Home Exercise Program (HEP)  [Encouraged]  Education Interventions   Education  Provided Provided Therapist, sports, Provided Education  Provided Verbal Education On Nutrition, Mental Health/Coping with Illness, Exercise, Medication, When to see the doctor, [employer], Development worker, community  Mental Health Interventions   Mental Health Discussed/Reviewed Mental Health Discussed, Mental Health Reviewed, Coping Strategies, Crisis, Suicide, Substance Abuse, Grief and Loss, Depression, Anxiety  Nutrition Interventions   Nutrition Discussed/Reviewed Nutrition Discussed, Nutrition Reviewed, Carbohydrate meal planning, Fluid intake, Portion sizes, Decreasing sugar intake, Decreasing salt, Decreasing fats, Increaing proteins  Pharmacy Interventions   Pharmacy Dicussed/Reviewed Pharmacy Topics Discussed, Pharmacy Topics Reviewed, Medication Adherence, Affording Medications, Referral to Pharmacist  Medication Adherence Not taking medication, Unable to refill medication  Referral to Pharmacist Drug interaction/side effects, Cannot afford medications  Safety Interventions   Safety Discussed/Reviewed Safety Discussed, Safety Reviewed, Fall Risk  Advanced Directive Interventions   Advanced Directives Discussed/Reviewed Advanced Directives Discussed      Active Listening & Reflection Utilized.  Verbalization of Feelings Encouraged.  Emotional Support Provided. Feelings of Contentment Validated. Problem Solving Interventions Employed. Task-Centered Solutions Indicated.  Solution-Focused Strategies Activated.  Cognitive Behavioral Therapy Initiated. Acceptance & Commitment Therapy Performed. Client-Centered Therapy Implemented. Encouraged Administration of Medications, Exactly as Prescribed. Encouraged Increased Level of Activity & Exercise & Routine Use of Elliptical Exercise Machine, as Tolerated. Encouraged Implementation of Deep Breathing Exercises, Relaxation Techniques, & Mindfulness Meditation Strategies Daily. Encouraged Contact with CSW (# [phone]), if You Have  Questions, Need Assistance, or If Additional Social Work Needs Are Identified Between Now & Our Next Follow-Up Outreach Call, Scheduled on 06/15/2023 at 10:30 AM.      SDOH assessments and interventions completed:  Yes.  Care Coordination Interventions:  Yes, provided.   Follow up plan: Follow up call scheduled for 06/15/2023 at 10:30 am.  Encounter Outcome:  Pt. Visit Completed.   [name], BSW, MSW, LCSW  Embedded  Practice Social Work Case Set designer Health  [facility], Population Health Direct Dial: [phone]  Fax: [phone] Email: [email or web address]: [email or web address]

## 2023-06-01 NOTE — Telephone Encounter (Signed)
Called pt and told him per Dr. Epimenio Foot that he wants him to get his lab work done in [employer]. Pt stated that his Second round of [place] he felt tired and weak,and he had a hard time transferring from the chair, his bed, and the bathroom, told pt Dr. Epimenio Foot will be notified about this manner. Told pt that if he notice any more changes to call us. Pt verbalized understanding.

## 2023-06-01 NOTE — Telephone Encounter (Signed)
Pt states he just completed his 2nd round of [place], he'd like to know when he needs to come in for labs, please call.

## 2023-06-02 ENCOUNTER — Encounter: Payer: Self-pay | Admitting: *Deleted

## 2023-06-08 ENCOUNTER — Encounter: Payer: Self-pay | Admitting: *Deleted

## 2023-06-08 ENCOUNTER — Ambulatory Visit: Payer: Self-pay | Admitting: *Deleted

## 2023-06-08 NOTE — Patient Outreach (Signed)
Care Coordination   Follow Up Visit Note   06/08/2023 Name: Chad Vasquez MRN: [id number] DOB: 1961-05-02  Chad Vasquez is a 62 y.o. year old male who sees Assunta Found, MD for primary care. I spoke with  Chad Vasquez by phone today.  What matters to the patients health and wellness today?  Managing symptoms of MS    Goals Addressed             This Visit's Progress    COMPLETED: Care Coordination Services (Financial)   On track    Care Coordination Goals: Patient will reach out to [facility] [place] at [phone] to discuss debt relief  Patient will reach out to Care Management team with any resource or care coordination needs (RN Care Coordinator [phone])     Manage Multiple Sclerosis   On track    Care Coordination Goals: Patient will keep all medical appointments Patient will reach out to provider with any new or worsening symptoms Patient will continue to use assistive devices in order to continue to perform ADLs and IADLs Wheelchair, shower bench chair, grab bars around toilet Patient will continue daily exercise with seated elliptical machine Patient will reach out to Care Coordination team member with any resource or care coordination needs     Manage Neuropathy   On track    Care Coordination Goals: Patient will continue to take Lyrica at bedtime and up to 3 times a day as prescribed Patient will reach out to provider with any new or worsening symptoms Patient will maintain b12 injections Patient will continue other mediations as prescribed Patient will keep f/u appointments with providers Patient will continue to use seated elliptical daily to improve strength and mobility Patient will reach out to RN Care Coordinator [phone] or other Care Coordination Team Member with any resource or care coordination needs         SDOH assessments and interventions completed:  Yes  SDOH Interventions Today    Flowsheet Row Most Recent  Value  SDOH Interventions   Transportation Interventions Intervention Not Indicated  Financial Strain Interventions Intervention Not Indicated        Care Coordination Interventions:  Yes, provided  Interventions Today    Flowsheet Row Most Recent Value  Chronic Disease   Chronic disease during today's visit Other  [Multiple sclerosis, neuropathy]  General Interventions   General Interventions Discussed/Reviewed General Interventions Discussed, General Interventions Reviewed, Doctor Visits, Vaccines  Vaccines Flu, Pneumonia, RSV  Doctor Visits Discussed/Reviewed Doctor Visits Discussed, Doctor Visits Reviewed, PCP, Annual Wellness Visits, Specialist  PCP/Specialist Visits Compliance with follow-up visit  Exercise Interventions   Exercise Discussed/Reviewed Exercise Discussed, Exercise Reviewed, Physical Activity, Assistive device use and maintanence  Physical Activity Discussed/Reviewed Physical Activity Discussed, Physical Activity Reviewed, Types of exercise  [encouraged continued use of pedal bike and seated exercises. Patient has noticed a derease in muscle mass. Explained how inactivity causes deconditioning and a decrease in muscle mass. Exercises can help to combat this.]  Education Interventions   Education Provided Provided Education  Provided Verbal Education On When to see the doctor, Mental Health/Coping with Illness, Medication, Exercise, Community Resources  [Continue to work with CSW Re: financial strain and resource options. concerned Re: recent increase in home value due to evaluations in [facility]. Has talked with tax dept Re: options to decrease amount owed. Has forms for neurologist to fill out.]  Mental Health Interventions   Mental Health Discussed/Reviewed Mental Health Discussed, Mental Health Reviewed  Nutrition Interventions   Nutrition Discussed/Reviewed Nutrition Discussed, Nutrition Reviewed, Adding fruits and vegetables, Portion sizes, Fluid intake,  Decreasing salt  [eat a well balanced diet with 3 meals per day]  Pharmacy Interventions   Pharmacy Dicussed/Reviewed Pharmacy Topics Discussed, Pharmacy Topics Reviewed, Medications and their functions  [taking medication as prescribed]  Safety Interventions   Safety Discussed/Reviewed Safety Discussed, Safety Reviewed, Fall Risk, Home Safety  Home Safety Assistive Devices       Follow up plan: Follow up call scheduled for 07/13/23    Encounter Outcome:  Patient Visit Completed   Demetrios Loll, RN, BSN Care Management Coordinator [facility]  [facility] Direct Dial: [phone] Main #: [phone]

## 2023-06-13 ENCOUNTER — Other Ambulatory Visit: Payer: Self-pay | Admitting: Neurology

## 2023-06-15 ENCOUNTER — Ambulatory Visit: Payer: Self-pay | Admitting: *Deleted

## 2023-06-15 ENCOUNTER — Encounter: Payer: Self-pay | Admitting: *Deleted

## 2023-06-15 NOTE — Telephone Encounter (Signed)
Last seen on 03/23/23 Follow up scheduled on 10/12/23 Last filled on 05/11/23 #120 tablets (30 day supply) Rx pending to be signed

## 2023-06-15 NOTE — Patient Outreach (Signed)
Care Coordination   Follow Up Visit Note   06/15/2023  Name: Chad Vasquez MRN: [id number] DOB: 07-10-1961  Chad Vasquez is a 62 y.o. year old male who sees Assunta Found, MD for primary care. I spoke with Chad Vasquez by phone today.  What matters to the patients health and wellness today?  Provide Supportive Counseling Regarding Change in Pain Medication Regimen.    Goals Addressed               This Visit's Progress     Provide Supportive Counseling Regarding Change in Pain Medication Regimen. (pt-stated)   On track     Care Coordination Interventions:  Interventions Today    Flowsheet Row Most Recent Value  Chronic Disease   Chronic disease during today's visit Hypertension (HTN), Chronic Obstructive Pulmonary Disease (COPD), Other  [Multiple Sclerosis, Paraplegia, Spasticity, Polysubstance Abuse, Generalized Weakness & Chronic Pain.]  General Interventions   General Interventions Discussed/Reviewed General Interventions Discussed, General Interventions Reviewed, Annual Eye Exam, Durable Medical Equipment (DME), Health Screening, Community Resources, Doctor Visits, Communication with  [facility] Care Provider]  Doctor Visits Discussed/Reviewed Doctor Visits Discussed, Doctor Visits Reviewed, Annual Wellness Visits, PCP, Specialist  Health Screening Bone Density, Colonoscopy, Prostate  [Encouraged]  Durable Medical Equipment (DME) Bed side commode, BP Cuff, Environmental consultant, Ecologist  PCP/Specialist Visits Compliance with follow-up visit  [Encouraged]  Communication with PCP/Specialists, Charity fundraiser, Pharmacists  Exercise Interventions   Exercise Discussed/Reviewed Exercise Discussed, Exercise Reviewed, Physical Activity, Assistive device use and maintanence  [Encouraged]  Physical Activity Discussed/Reviewed Physical Activity Discussed, Physical Activity Reviewed, Types of exercise, Home Exercise Program (HEP)  [Encouraged]  Education Interventions   Education  Provided Provided Therapist, sports, Provided Education  Provided Verbal Education On Nutrition, Mental Health/Coping with Illness, Exercise, Medication, When to see the doctor, [employer], Development worker, community  Mental Health Interventions   Mental Health Discussed/Reviewed Mental Health Discussed, Mental Health Reviewed, Coping Strategies, Crisis, Suicide, Substance Abuse, Grief and Loss, Depression, Anxiety  Nutrition Interventions   Nutrition Discussed/Reviewed Nutrition Discussed, Nutrition Reviewed, Carbohydrate meal planning, Fluid intake, Portion sizes, Decreasing sugar intake, Decreasing salt, Decreasing fats, Increaing proteins  Pharmacy Interventions   Pharmacy Dicussed/Reviewed Pharmacy Topics Discussed, Pharmacy Topics Reviewed, Medication Adherence, Affording Medications, Referral to Pharmacist  Medication Adherence Not taking medication, Unable to refill medication  Referral to Pharmacist Drug interaction/side effects, Cannot afford medications  Safety Interventions   Safety Discussed/Reviewed Safety Discussed, Safety Reviewed, Fall Risk  Advanced Directive Interventions   Advanced Directives Discussed/Reviewed Advanced Directives Discussed      Active Listening & Reflection Performed.  Verbalization of Feelings Encouraged.  Emotional Support & Empathy Provided. Feelings of Hopefulness & Motivation Validated. Problem Solving Interventions Revised. Task-Centered Solutions Resolved.  Solution-Focused Strategies Employed.  Cognitive Behavioral Therapy Conducted. Acceptance & Commitment Therapy Indicated. Client-Centered Therapy Initiated. Encouraged Continued Daily Use of Seated Elliptical to Improve Strength, Mobility, Safety, Conditioning, Etc.  Encouraged Review of Educational Material, "Depression Self-Care Action Plan", Emailed on 06/15/2023, & Be Prepared to Complete with CSW During Our Next Scheduled Follow-Up Outreach Call. Encouraged Physical Activity, Making Time  for Pleasurable Activities, Spending Time with People Who Can Support You, & Practicing Relaxation. Encouraged Solicitor with Representative at [facility] [place] (# [phone]), to Inquire About Debt Relief/Management Program for Self-Enrollment.   Encouraged Contact with CSW (# [phone]), if You Have Questions, Need Assistance, or If Additional Social Work Needs Are Identified Between Now & Our Next Follow-Up Outreach Call, Scheduled on 07/06/2023 at  11:15 AM. Encouraged Engagement with Demetrios Loll, Nurse Care Coordinator with [facility] (# [phone].), During Follow-Up Outreach Call, Scheduled on 07/13/2023 at 11:30 AM. Encouraged Attendance at Follow-Up Appointment with Dr. Despina Arias, Neurologist with [facility] [phone]), Scheduled on 10/12/2023 at 1:30 PM.      SDOH assessments and interventions completed:  Yes.  Care Coordination Interventions:  Yes, provided.   Follow up plan: Follow up call scheduled for 07/06/2023 at 11:15 am.  Encounter Outcome:  Patient Visit Completed.   [name], BSW, MSW, Printmaker Social Work Case Set designer Health  [facility], Population Health Direct Dial: [phone]  Fax: [phone] Email: [email or web address]: [email or web address]

## 2023-06-15 NOTE — Patient Instructions (Signed)
Visit Information  Thank you for taking time to visit with me today. Please don't hesitate to contact me if I can be of assistance to you.   Following are the goals we discussed today:   Goals Addressed               This Visit's Progress     Provide Supportive Counseling Regarding Change in Pain Medication Regimen. (pt-stated)   On track     Care Coordination Interventions:  Interventions Today    Flowsheet Row Most Recent Value  Chronic Disease   Chronic disease during today's visit Hypertension (HTN), Chronic Obstructive Pulmonary Disease (COPD), Other  [Multiple Sclerosis, Paraplegia, Spasticity, Polysubstance Abuse, Generalized Weakness & Chronic Pain.]  General Interventions   General Interventions Discussed/Reviewed General Interventions Discussed, General Interventions Reviewed, Annual Eye Exam, Durable Medical Equipment (DME), Health Screening, Community Resources, Doctor Visits, Communication with  [facility] Care Provider]  Doctor Visits Discussed/Reviewed Doctor Visits Discussed, Doctor Visits Reviewed, Annual Wellness Visits, PCP, Specialist  Health Screening Bone Density, Colonoscopy, Prostate  [Encouraged]  Durable Medical Equipment (DME) Bed side commode, BP Cuff, Environmental consultant, Ecologist  PCP/Specialist Visits Compliance with follow-up visit  [Encouraged]  Communication with PCP/Specialists, Charity fundraiser, Pharmacists  Exercise Interventions   Exercise Discussed/Reviewed Exercise Discussed, Exercise Reviewed, Physical Activity, Assistive device use and maintanence  [Encouraged]  Physical Activity Discussed/Reviewed Physical Activity Discussed, Physical Activity Reviewed, Types of exercise, Home Exercise Program (HEP)  [Encouraged]  Education Interventions   Education Provided Provided Therapist, sports, Provided Education  Provided Verbal Education On Nutrition, Mental Health/Coping with Illness, Exercise, Medication, When to see the doctor, [employer],  Development worker, community  Mental Health Interventions   Mental Health Discussed/Reviewed Mental Health Discussed, Mental Health Reviewed, Coping Strategies, Crisis, Suicide, Substance Abuse, Grief and Loss, Depression, Anxiety  Nutrition Interventions   Nutrition Discussed/Reviewed Nutrition Discussed, Nutrition Reviewed, Carbohydrate meal planning, Fluid intake, Portion sizes, Decreasing sugar intake, Decreasing salt, Decreasing fats, Increaing proteins  Pharmacy Interventions   Pharmacy Dicussed/Reviewed Pharmacy Topics Discussed, Pharmacy Topics Reviewed, Medication Adherence, Affording Medications, Referral to Pharmacist  Medication Adherence Not taking medication, Unable to refill medication  Referral to Pharmacist Drug interaction/side effects, Cannot afford medications  Safety Interventions   Safety Discussed/Reviewed Safety Discussed, Safety Reviewed, Fall Risk  Advanced Directive Interventions   Advanced Directives Discussed/Reviewed Advanced Directives Discussed      Active Listening & Reflection Performed.  Verbalization of Feelings Encouraged.  Emotional Support & Empathy Provided. Feelings of Hopefulness & Motivation Validated. Problem Solving Interventions Revised. Task-Centered Solutions Resolved.  Solution-Focused Strategies Employed.  Cognitive Behavioral Therapy Conducted. Acceptance & Commitment Therapy Indicated. Client-Centered Therapy Initiated. Encouraged Continued Daily Use of Seated Elliptical to Improve Strength, Mobility, Safety, Conditioning, Etc.  Encouraged Review of Educational Material, "Depression Self-Care Action Plan", Emailed on 06/15/2023, & Be Prepared to Complete with CSW During Our Next Scheduled Follow-Up Outreach Call. Encouraged Physical Activity, Making Time for Pleasurable Activities, Spending Time with People Who Can Support You, & Practicing Relaxation. Encouraged Solicitor with Representative at [facility] [place] (# [phone]), to  Inquire About Debt Relief/Management Program for Self-Enrollment.   Encouraged Contact with CSW (# [phone]), if You Have Questions, Need Assistance, or If Additional Social Work Needs Are Identified Between Now & Our Next Follow-Up Outreach Call, Scheduled on 07/06/2023 at 11:15 AM. Encouraged Engagement with Demetrios Loll, Nurse Care Coordinator with [facility] (# [phone].), During Follow-Up Outreach Call, Scheduled on 07/13/2023 at 11:30 AM. Encouraged Attendance at Follow-Up Appointment with Dr.  Despina Arias, Neurologist with [facility] [phone]), Scheduled on 10/12/2023 at 1:30 PM.      Our next appointment is by telephone on 07/06/2023 at 11:15 am.  Please call the care guide team at [phone] if you need to cancel or reschedule your appointment.   If you are experiencing a Mental Health or Behavioral Health Crisis or need someone to talk to, please call the Suicide and Crisis Lifeline: 988 call the [place] National Suicide Prevention Lifeline: 628-216-0419 or TTY: (431)581-7590 TTY 715-444-5039) to talk to a trained counselor call 1-800-273-TALK (toll free, 24 hour hotline) go to [facility] [address] [place] [phone]) call the [facility] Crisis Line: [phone] call 911  Patient verbalizes understanding of instructions and care plan provided today and agrees to view in MyChart. Active MyChart status and patient understanding of how to access instructions and care plan via MyChart confirmed with patient.     Telephone follow up appointment with care management team member scheduled for:  07/06/2023 at 11:15 am.   [name], BSW, MSW, LCSW  Embedded Practice Social Work Case Manager  [facility], Population Health Direct Dial: [phone]  Fax: [phone] Email: [email or web address] Website: [email or web address]

## 2023-07-04 DIAGNOSIS — K5900 Constipation, unspecified: Secondary | ICD-10-CM | POA: Diagnosis not present

## 2023-07-04 DIAGNOSIS — Z79891 Long term (current) use of opiate analgesic: Secondary | ICD-10-CM | POA: Diagnosis not present

## 2023-07-04 DIAGNOSIS — F419 Anxiety disorder, unspecified: Secondary | ICD-10-CM | POA: Diagnosis not present

## 2023-07-04 DIAGNOSIS — J441 Chronic obstructive pulmonary disease with (acute) exacerbation: Secondary | ICD-10-CM | POA: Diagnosis not present

## 2023-07-04 DIAGNOSIS — G8929 Other chronic pain: Secondary | ICD-10-CM | POA: Diagnosis not present

## 2023-07-06 ENCOUNTER — Ambulatory Visit: Payer: Self-pay | Admitting: *Deleted

## 2023-07-06 NOTE — Patient Outreach (Signed)
Care Coordination   Follow Up Visit Note   07/06/2023  Name: Chad Vasquez MRN: [id number] DOB: 13-Sep-1961  Chad Vasquez is a 62 y.o. year old male who sees Assunta Found, MD for primary care. I spoke with Chad Vasquez by phone today.  What matters to the patients health and wellness today?  Provide Supportive Counseling Regarding Change in Pain Medication Regimen.    Goals Addressed               This Visit's Progress     Provide Supportive Counseling Regarding Change in Pain Medication Regimen. (pt-stated)   On track     Care Coordination Interventions:  Interventions Today    Flowsheet Row Most Recent Value  Chronic Disease   Chronic disease during today's visit Hypertension (HTN), Chronic Obstructive Pulmonary Disease (COPD), Other  [Multiple Sclerosis, Paraplegia, Spasticity, Polysubstance Abuse, Generalized Weakness & Chronic Pain.]  General Interventions   General Interventions Discussed/Reviewed General Interventions Discussed, General Interventions Reviewed, Annual Eye Exam, Durable Medical Equipment (DME), Health Screening, Community Resources, Doctor Visits, Communication with  [facility] Health Care Care Provider]  Doctor Visits Discussed/Reviewed Doctor Visits Discussed, Doctor Visits Reviewed, Annual Wellness Visits, PCP, Specialist  Health Screening Bone Density, Colonoscopy, Prostate  [Encouraged]  Durable Medical Equipment (DME) Bed side commode, BP Cuff, Environmental consultant, Ecologist  PCP/Specialist Visits Compliance with follow-up visit  [Encouraged]  Communication with PCP/Specialists, Charity fundraiser, Pharmacists  Exercise Interventions   Exercise Discussed/Reviewed Exercise Discussed, Exercise Reviewed, Physical Activity, Assistive device use and maintanence  [Encouraged]  Physical Activity Discussed/Reviewed Physical Activity Discussed, Physical Activity Reviewed, Types of exercise, Home Exercise Program (HEP)  [Encouraged]  Education Interventions   Education  Provided Provided Therapist, sports, Provided Education  Provided Verbal Education On Nutrition, Mental Health/Coping with Illness, Exercise, Medication, When to see the doctor, [employer], Development worker, community  Mental Health Interventions   Mental Health Discussed/Reviewed Mental Health Discussed, Mental Health Reviewed, Coping Strategies, Crisis, Suicide, Substance Abuse, Grief and Loss, Depression, Anxiety  Nutrition Interventions   Nutrition Discussed/Reviewed Nutrition Discussed, Nutrition Reviewed, Carbohydrate meal planning, Fluid intake, Portion sizes, Decreasing sugar intake, Decreasing salt, Decreasing fats, Increaing proteins  Pharmacy Interventions   Pharmacy Dicussed/Reviewed Pharmacy Topics Discussed, Pharmacy Topics Reviewed, Medication Adherence, Affording Medications, Referral to Pharmacist  Medication Adherence Not taking medication, Unable to refill medication  Referral to Pharmacist Drug interaction/side effects, Cannot afford medications  Safety Interventions   Safety Discussed/Reviewed Safety Discussed, Safety Reviewed, Fall Risk  Advanced Directive Interventions   Advanced Directives Discussed/Reviewed Advanced Directives Discussed      Active Listening & Reflection Utilized.  Verbalization of Feelings Encouraged.  Emotional Support Provided. Feelings of Restlessness & Anxiousness Acknowledged. Symptoms of Chronic Fatigue Validated Problem Solving Interventions Implemented. Solution-Focused Strategies Indicated.  Cognitive Behavioral Therapy Performed. Acceptance & Commitment Therapy Initiated. Client-Centered Therapy Conducted. Encouraged Consistent Use of Nurse, children's, Paediatric nurse, Tub Bench, Grab Bars, & Raised Engineer, civil (consulting)) to Aide in Performing Activities of Daily Living, in An Effort to Improve Safety & Prevent Risk for Falls & Injury.  Encouraged Journaling As a Means of Expressing Thoughts, Feelings, & Emotions. Encouraged Daily  Use of Seated Elliptical Machine, to Receive Exercise & Improve Strength, Mobility, Safety, Conditioning, Etc.  Encouraged Administration of Medications, Exactly as Prescribed. Encouraged Routine Engagement in Activities of Interest, Inside & Outside the Home. Encouraged Increased Level of Activity & Exercise, as Tolerated. Encouraged Daily Implementation of Deep Breathing Exercises, Relaxation Techniques, & Mindfulness Meditation Strategies. Encouraged Self-Enrollment in Debt Relief/Management  Program, Offered through [employer] [facility] [place] [phone]), to [employer], Counseling, Resources, Etc., in An Effort to [employer] Under Control.    Encouraged Engagement with Demetrios Loll, Nurse Care Management Coordinator with [facility] [phone]), During Follow-Up Outreach Call, Scheduled on 07/13/2023 at 11:30 AM. Encouraged Engagement with [name], Social Work Case Manager with [facility] [phone]), if You Have Questions, Need Assistance, or If Additional Social Work Needs Are Identified Between Now & Our Next Follow-Up Outreach Call, Scheduled on 08/08/2023 at 2:00 PM. Encouraged Attendance at Follow-Up Appointment with Dr. Despina Arias, Neurologist with [facility] (# [phone]), Scheduled on 10/12/2023 at 1:30 PM.      SDOH assessments and interventions completed:  Yes.  Care Coordination Interventions:  Yes, provided.   Follow up plan: Follow up call scheduled for 08/08/2023 at 2:00 pm.  Encounter Outcome:  Patient Visit Completed.   [name], BSW, MSW, Printmaker Social Work Case Set designer Health  [facility], Population Health Direct Dial: [phone]  Fax: [phone] Email: [email or web address]: [email or web address]

## 2023-07-06 NOTE — Patient Instructions (Signed)
Visit Information  Thank you for taking time to visit with me today. Please don't hesitate to contact me if I can be of assistance to you.   Following are the goals we discussed today:   Goals Addressed               This Visit's Progress     Provide Supportive Counseling Regarding Change in Pain Medication Regimen. (pt-stated)   On track     Care Coordination Interventions:  Interventions Today    Flowsheet Row Most Recent Value  Chronic Disease   Chronic disease during today's visit Hypertension (HTN), Chronic Obstructive Pulmonary Disease (COPD), Other  [Multiple Sclerosis, Paraplegia, Spasticity, Polysubstance Abuse, Generalized Weakness & Chronic Pain.]  General Interventions   General Interventions Discussed/Reviewed General Interventions Discussed, General Interventions Reviewed, Annual Eye Exam, Durable Medical Equipment (DME), Health Screening, Community Resources, Doctor Visits, Communication with  [facility] Health Care Care Provider]  Doctor Visits Discussed/Reviewed Doctor Visits Discussed, Doctor Visits Reviewed, Annual Wellness Visits, PCP, Specialist  Health Screening Bone Density, Colonoscopy, Prostate  [Encouraged]  Durable Medical Equipment (DME) Bed side commode, BP Cuff, Environmental consultant, Ecologist  PCP/Specialist Visits Compliance with follow-up visit  [Encouraged]  Communication with PCP/Specialists, Charity fundraiser, Pharmacists  Exercise Interventions   Exercise Discussed/Reviewed Exercise Discussed, Exercise Reviewed, Physical Activity, Assistive device use and maintanence  [Encouraged]  Physical Activity Discussed/Reviewed Physical Activity Discussed, Physical Activity Reviewed, Types of exercise, Home Exercise Program (HEP)  [Encouraged]  Education Interventions   Education Provided Provided Therapist, sports, Provided Education  Provided Verbal Education On Nutrition, Mental Health/Coping with Illness, Exercise, Medication, When to see the doctor, [employer],  Development worker, community  Mental Health Interventions   Mental Health Discussed/Reviewed Mental Health Discussed, Mental Health Reviewed, Coping Strategies, Crisis, Suicide, Substance Abuse, Grief and Loss, Depression, Anxiety  Nutrition Interventions   Nutrition Discussed/Reviewed Nutrition Discussed, Nutrition Reviewed, Carbohydrate meal planning, Fluid intake, Portion sizes, Decreasing sugar intake, Decreasing salt, Decreasing fats, Increaing proteins  Pharmacy Interventions   Pharmacy Dicussed/Reviewed Pharmacy Topics Discussed, Pharmacy Topics Reviewed, Medication Adherence, Affording Medications, Referral to Pharmacist  Medication Adherence Not taking medication, Unable to refill medication  Referral to Pharmacist Drug interaction/side effects, Cannot afford medications  Safety Interventions   Safety Discussed/Reviewed Safety Discussed, Safety Reviewed, Fall Risk  Advanced Directive Interventions   Advanced Directives Discussed/Reviewed Advanced Directives Discussed      Active Listening & Reflection Utilized.  Verbalization of Feelings Encouraged.  Emotional Support Provided. Feelings of Restlessness & Anxiousness Acknowledged. Symptoms of Chronic Fatigue Validated Problem Solving Interventions Implemented. Solution-Focused Strategies Indicated.  Cognitive Behavioral Therapy Performed. Acceptance & Commitment Therapy Initiated. Client-Centered Therapy Conducted. Encouraged Consistent Use of Nurse, children's, Paediatric nurse, Tub Bench, Grab Bars, & Raised Engineer, civil (consulting)) to Aide in Performing Activities of Daily Living, in An Effort to Improve Safety & Prevent Risk for Falls & Injury.  Encouraged Journaling As a Means of Expressing Thoughts, Feelings, & Emotions. Encouraged Daily Use of Seated Elliptical Machine, to Receive Exercise & Improve Strength, Mobility, Safety, Conditioning, Etc.  Encouraged Administration of Medications, Exactly as Prescribed. Encouraged Routine  Engagement in Activities of Interest, Inside & Outside the Home. Encouraged Increased Level of Activity & Exercise, as Tolerated. Encouraged Daily Implementation of Deep Breathing Exercises, Relaxation Techniques, & Mindfulness Meditation Strategies. Encouraged Self-Enrollment in Debt Relief/Management Program, Offered through [facility] [place] [phone]), to [employer], Counseling, Resources, Etc., in An Effort to [employer] Under Control.    Encouraged Engagement with Demetrios Loll, Nurse  Care Management Coordinator with [facility] [phone]), During Follow-Up Outreach Call, Scheduled on 07/13/2023 at 11:30 AM. Encouraged Engagement with [name], Social Work Case Manager with [facility] [phone]), if You Have Questions, Need Assistance, or If Additional Social Work Needs Are Identified Between Now & Our Next Follow-Up Outreach Call, Scheduled on 08/08/2023 at 2:00 PM. Encouraged Attendance at Follow-Up Appointment with Dr. Despina Arias, Neurologist with [facility] [phone]), Scheduled on 10/12/2023 at 1:30 PM.      Our next appointment is by telephone on 08/08/2023 at 2:00 pm.  Please call the care guide team at [phone] if you need to cancel or reschedule your appointment.   If you are experiencing a Mental Health or Behavioral Health Crisis or need someone to talk to, please call the Suicide and Crisis Lifeline: 988 call the [place] National Suicide Prevention Lifeline: 6392653496 or TTY: 725-098-0701 TTY 904-535-8450) to talk to a trained counselor call 1-800-273-TALK (toll free, 24 hour hotline) go to [facility] [address] [place] [phone]) call the [facility] Crisis Line: [phone] call 911  Patient verbalizes understanding of instructions and care plan provided today and  agrees to view in MyChart. Active MyChart status and patient understanding of how to access instructions and care plan via MyChart confirmed with patient.     Telephone follow up appointment with care management team member scheduled for:  08/08/2023 at 2:00 pm.  [name], BSW, MSW, LCSW  Embedded Practice Social Work Case Manager  [facility], Population Health Direct Dial: [phone]  Fax: [phone] Email: [email or web address]

## 2023-07-13 ENCOUNTER — Encounter: Payer: Self-pay | Admitting: *Deleted

## 2023-07-13 ENCOUNTER — Ambulatory Visit: Payer: Self-pay | Admitting: *Deleted

## 2023-07-14 NOTE — Patient Outreach (Signed)
Care Coordination   Follow Up Visit Note   07/13/2023 Name: Chad Vasquez MRN: [id number] DOB: [birth year]  Chad Vasquez is a 62 y.o. year old male who sees Assunta Found, MD for primary care. I spoke with  [name] by phone today.  What matters to the patients health and wellness today?  Managing chronic pain and symptoms of multiple sclerosis    Goals Addressed             This Visit's Progress    Manage Multiple Sclerosis   On track    Care Coordination Goals: Patient will keep all medical appointments Patient will reach out to provider with any new or worsening symptoms Patient will continue to use assistive devices in order to continue to perform ADLs and IADLs Wheelchair, shower bench chair, grab bars around toilet Patient will continue daily exercise with seated elliptical machine Patient will reach out to Care Coordination team member with any resource or care coordination needs     Manage Neuropathy   On track    Care Coordination Goals: Patient will continue to take Lyrica at bedtime and up to 3 times a day as prescribed Patient will reach out to provider with any new or worsening symptoms Patient will maintain b12 injections Patient will continue other mediations as prescribed Patient will keep f/u appointments with providers 10/12/23 Dr Epimenio Foot (neurology) Patient will continue to use seated elliptical daily to improve strength and mobility Patient will reach out to RN Care Coordinator [phone] or other Care Coordination Team Member with any resource or care coordination needs         SDOH assessments and interventions completed:  Yes  SDOH Interventions Today    Flowsheet Row Most Recent Value  SDOH Interventions   Transportation Interventions Intervention Not Indicated  Physical Activity Interventions Intervention Not Indicated  [limited by MS and lower extremity weakness but uses an elliptical machine at home daily]        Care  Coordination Interventions:  Yes, provided  Interventions Today    Flowsheet Row Most Recent Value  Chronic Disease   Chronic disease during today's visit Other  [Multiple Sclerosis, Chronic Pain]  General Interventions   General Interventions Discussed/Reviewed General Interventions Discussed, General Interventions Reviewed, Doctor Visits, Vaccines, Durable Medical Equipment (DME)  Vaccines COVID-19, Flu, Pneumonia  [Encouraged]  Doctor Visits Discussed/Reviewed Doctor Visits Discussed, Doctor Visits Reviewed, Annual Wellness Visits, PCP, Specialist  Durable Medical Equipment (DME) Wheelchair, Shower bench  Wheelchair Motorized  PCP/Specialist Visits Compliance with follow-up visit  [10/12/23 Dr Epimenio Foot (neurology)]  Exercise Interventions   Exercise Discussed/Reviewed Exercise Discussed, Exercise Reviewed, Physical Activity, Assistive device use and maintanence  Physical Activity Discussed/Reviewed Physical Activity Discussed, Physical Activity Reviewed, Types of exercise, Home Exercise Program (HEP)  [continue to use pedal bike. encouraged at least 150 minutes of moderate activity per week.]  Education Interventions   Education Provided Provided Education  Provided Verbal Education On Nutrition, Labs, Mental Health/Coping with Illness, When to see the doctor, Exercise, Medication  Labs Reviewed Kidney Function  [05/23/23 Creatinine .85, GFR 97, WBC 4.1, Hgb 13.5]  Mental Health Interventions   Mental Health Discussed/Reviewed Mental Health Discussed, Mental Health Reviewed, Other  [Therapeutic listening utilized Re: decreaesd mobility and ability to perform activities that he was once able to do. Encouraged to continue talking with LCSW regarding coping strategies and financial concerns.]  Nutrition Interventions   Nutrition Discussed/Reviewed Nutrition Discussed, Nutrition Reviewed, Adding fruits and vegetables, Fluid intake  [Eat a  well balanced diet]  Pharmacy Interventions   Pharmacy  Dicussed/Reviewed Pharmacy Topics Discussed, Pharmacy Topics Reviewed, Medications and their functions  [Taking medications as directed. No questions or concerns.]  Safety Interventions   Safety Discussed/Reviewed Safety Discussed, Safety Reviewed, Fall Risk, Home Safety  Home Safety Assistive Devices  Advanced Directive Interventions   Advanced Directives Discussed/Reviewed Advanced Directives Discussed, Advanced Directives Reviewed       Follow up plan: Follow up call scheduled for 08/25/23    Encounter Outcome:  Patient Visit Completed   Demetrios Loll, RN, BSN Care Management Coordinator [facility]  [facility] Direct Dial: [phone] Main #: [phone]

## 2023-08-08 ENCOUNTER — Ambulatory Visit: Payer: Self-pay | Admitting: *Deleted
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
## 2023-08-08 NOTE — Patient Instructions (Signed)
Visit Information  Thank you for taking time to visit with me today. Please don't hesitate to contact me if I can be of assistance to you.   Following are the goals we discussed today:   Goals Addressed               This Visit's Progress     COMPLETED: Provide Supportive Counseling Regarding Change in Pain Medication Regimen. (pt-stated)   On track     Care Coordination Interventions:  Interventions Today    Flowsheet Row Most Recent Value  Chronic Disease   Chronic disease during today's visit Chronic Obstructive Pulmonary Disease (COPD), Hypertension (HTN), Other  [Multiple Sclerosis, Paraplegia, Spasticity, Polysubstance Abuse, Generalized Weakness & Chronic Pain.]  General Interventions   General Interventions Discussed/Reviewed General Interventions Discussed, Lipid Profile, Doctor Visits, Durable Medical Equipment (DME), Communication with, Level of Care, Vaccines, General Interventions Reviewed, Annual Eye Exam, Health Screening, Labs, Annual Foot Exam, Community Resources  [Communication with Care Team Members]  Labs Hgb A1c every 3 months, Kidney Function  [Encouraged]  Vaccines COVID-19, Flu, Pneumonia, RSV, Shingles, Tetanus/Pertussis/Diphtheria  [Encouraged]  Doctor Visits Discussed/Reviewed Doctor Visits Discussed, Doctor Visits Reviewed, Annual Wellness Visits, PCP, Specialist  [Encouraged]  Health Screening Bone Density, Colonoscopy, Prostate  [Encouraged]  Durable Medical Equipment (DME) Bed side commode, BP Cuff, Walker, Wheelchair, Other  [Prescription Glasses, Sports administrator, Scales, Hand-Held Shower [place], Building surveyor, Chiropodist, Statistician, Medical laboratory scientific officer, Engineer, civil (consulting), Motorized  PCP/Specialist Visits Compliance with follow-up visit  [Encouraged]  Communication with PCP/Specialists, Charity fundraiser, Pharmacists, Social Work  [Encouraged]  Exercise Interventions   Exercise Discussed/Reviewed Exercise Discussed, Exercise Reviewed, Physical Activity, Weight  Managment, Assistive device use and maintanence  [Encouraged]  Physical Activity Discussed/Reviewed Physical Activity Discussed, Physical Activity Reviewed, Types of exercise, Home Exercise Program (HEP), PREP, Gym  [Encouraged]  Weight Management Weight loss  [Encouraged]  Education Interventions   Education Provided Provided Education  Provided Verbal Education On Nutrition, Exercise, Foot Care, Medication, When to see the doctor, Eye Care, Labs, Blood Sugar Monitoring, Mental Health/Coping with Illness, [employer], [employer], Applications  [Encouraged]  Labs Reviewed Hgb A1c  [Reviewed Results]  Mental Health Interventions   Mental Health Discussed/Reviewed Mental Health Discussed, Substance Abuse, Suicide, Mental Health Reviewed, Other, Coping Strategies, Crisis, Anxiety, Depression, Grief and Loss  [Confirmed Absence from Domestic Violence]  Nutrition Interventions   Nutrition Discussed/Reviewed Nutrition Discussed, Portion sizes, Decreasing sugar intake, Nutrition Reviewed, Increasing proteins, Carbohydrate meal planning, Decreasing fats, Adding fruits and vegetables, Decreasing salt, Fluid intake  [Encouraged]  Pharmacy Interventions   Pharmacy Dicussed/Reviewed Pharmacy Topics Discussed, Affording Medications, Pharmacy Topics Reviewed, Medications and their functions, Medication Adherence  [Encouraged]  Medication Adherence --  [Complaint with Medications]  Safety Interventions   Safety Discussed/Reviewed Safety Discussed, Safety Reviewed, Fall Risk  [Encouraged]  Advanced Directive Interventions   Advanced Directives Discussed/Reviewed Advanced Directives Discussed, Advanced Directives Reviewed  [Encouraged Initiation, Offering Advanced Directives Packet]      Active Listening & Reflection Utilized.  Verbalization of Feelings Encouraged.  Emotional Support Provided. Problem Solving Interventions Employed. Solution-Focused Strategies Implemented.  Cognitive Behavioral  Therapy Initiated. Acceptance & Commitment Therapy Indicated. Client-Centered Therapy Performed. Encouraged Self-Enrollment in Debt Relief/Management Program, Offered through [facility] [place] [phone]), to [employer], Counseling, Resources, Etc., in An Effort to [employer] Under Control.    Encouraged Engagement with [name], Social Work Case Manager with [facility] [phone]), if You Have Questions, Need Assistance, or If Additional Social Work Needs  Are Identified in The Near Future.       Please call the care guide team at [phone] if you need to cancel or reschedule your appointment.   If you are experiencing a Mental Health or Behavioral Health Crisis or need someone to talk to, please call the Suicide and Crisis Lifeline: 988 call the [place] National Suicide Prevention Lifeline: 252-535-5138 or TTY: 949-676-6721 TTY (404) 689-5029) to talk to a trained counselor call 1-800-273-TALK (toll free, 24 hour hotline) go to [facility] [address], [place] [phone]) call the [facility] Crisis Line: [phone] call 911  Patient verbalizes understanding of instructions and care plan provided today and agrees to view in MyChart. Active MyChart status and patient understanding of how to access instructions and care plan via MyChart confirmed with patient.     No further follow up required.  [name], BSW, MSW, Printmaker Social Work Case Set designer Health  [facility], Population Health Direct Dial: [phone]  Fax: [phone] Email: [email or web address] Website: [email or web address]

## 2023-08-11 DIAGNOSIS — Z6829 Body mass index (BMI) 29.0-29.9, adult: Secondary | ICD-10-CM | POA: Diagnosis not present

## 2023-08-11 DIAGNOSIS — E039 Hypothyroidism, unspecified: Secondary | ICD-10-CM | POA: Diagnosis not present

## 2023-08-11 DIAGNOSIS — Z1331 Encounter for screening for depression: Secondary | ICD-10-CM | POA: Diagnosis not present

## 2023-08-11 DIAGNOSIS — D869 Sarcoidosis, unspecified: Secondary | ICD-10-CM | POA: Diagnosis not present

## 2023-08-11 DIAGNOSIS — E663 Overweight: Secondary | ICD-10-CM | POA: Diagnosis not present

## 2023-08-11 DIAGNOSIS — E785 Hyperlipidemia, unspecified: Secondary | ICD-10-CM | POA: Diagnosis not present

## 2023-08-11 DIAGNOSIS — Z79891 Long term (current) use of opiate analgesic: Secondary | ICD-10-CM | POA: Diagnosis not present

## 2023-08-11 DIAGNOSIS — Z0001 Encounter for general adult medical examination with abnormal findings: Secondary | ICD-10-CM | POA: Diagnosis not present

## 2023-08-11 DIAGNOSIS — Z23 Encounter for immunization: Secondary | ICD-10-CM | POA: Diagnosis not present

## 2023-08-11 DIAGNOSIS — J449 Chronic obstructive pulmonary disease, unspecified: Secondary | ICD-10-CM | POA: Diagnosis not present

## 2023-08-11 DIAGNOSIS — G35 Multiple sclerosis: Secondary | ICD-10-CM | POA: Diagnosis not present

## 2023-08-11 DIAGNOSIS — G8220 Paraplegia, unspecified: Secondary | ICD-10-CM | POA: Diagnosis not present

## 2023-08-11 DIAGNOSIS — E559 Vitamin D deficiency, unspecified: Secondary | ICD-10-CM | POA: Diagnosis not present

## 2023-08-23 ENCOUNTER — Encounter: Payer: Self-pay | Admitting: *Deleted

## 2023-08-23 ENCOUNTER — Ambulatory Visit: Payer: Medicare Other | Admitting: *Deleted

## 2023-08-23 DIAGNOSIS — G8220 Paraplegia, unspecified: Secondary | ICD-10-CM

## 2023-08-23 DIAGNOSIS — G35 Multiple sclerosis: Secondary | ICD-10-CM

## 2023-08-23 DIAGNOSIS — M79604 Pain in right leg: Secondary | ICD-10-CM

## 2023-08-23 DIAGNOSIS — R252 Cramp and spasm: Secondary | ICD-10-CM

## 2023-08-23 NOTE — Patient Outreach (Addendum)
Care Coordination   Follow Up Visit Note   08/23/2023  Name: Chad Vasquez MRN: [id number] DOB: [birth year]  Chad Vasquez is a 62 y.o. year old male who sees Chad Found, MD for primary care. I spoke with Chad Vasquez by phone today.  What matters to the patients health and wellness today?  Find Help in My Community.    Goals Addressed               This Visit's Progress     Find Help in My Community. (pt-stated)   On track     Care Coordination Interventions:  Interventions Today    Flowsheet Row Most Recent Value  Chronic Disease   Chronic disease during today's visit Chronic Obstructive Pulmonary Disease (COPD), Hypertension (HTN), Other  [Multiple Sclerosis, Paraplegia, Spasticity, Polysubstance Abuse, Generalized Weakness & Chronic Pain.]  General Interventions   General Interventions Discussed/Reviewed General Interventions Discussed, Lipid Profile, Doctor Visits, Durable Medical Equipment (DME), Communication with, Level of Care, Vaccines, General Interventions Reviewed, Annual Eye Exam, Health Screening, Labs, Annual Foot Exam, Community Resources  [Communication with Care Team Members]  Labs Hgb A1c every 3 months, Kidney Function  [Encouraged]  Vaccines COVID-19, Flu, Pneumonia, RSV, Shingles, Tetanus/Pertussis/Diphtheria  [Encouraged]  Doctor Visits Discussed/Reviewed Doctor Visits Discussed, Doctor Visits Reviewed, Annual Wellness Visits, PCP, Specialist  [Encouraged]  Health Screening Bone Density, Colonoscopy, Prostate  [Encouraged]  Durable Medical Equipment (DME) Bed side commode, BP Cuff, Walker, Wheelchair, Other  [Prescription Glasses, Sports administrator, Scales, Hand-Held Shower [place], Building surveyor, Chiropodist, Statistician, Medical laboratory scientific officer, Engineer, civil (consulting), Motorized  PCP/Specialist Visits Compliance with follow-up visit  [Encouraged]  Communication with PCP/Specialists, Charity fundraiser, Pharmacists, Social Work  [Encouraged]  Exercise Interventions    Exercise Discussed/Reviewed Exercise Discussed, Exercise Reviewed, Physical Activity, Weight Managment, Assistive device use and maintanence  [Encouraged]  Physical Activity Discussed/Reviewed Physical Activity Discussed, Physical Activity Reviewed, Types of exercise, Home Exercise Program (HEP), PREP, Gym  [Encouraged]  Weight Management Weight loss  [Encouraged]  Education Interventions   Education Provided Provided Education  Provided Verbal Education On Nutrition, Exercise, Foot Care, Medication, When to see the doctor, Eye Care, Labs, Blood Sugar Monitoring, Mental Health/Coping with Illness, [employer], [employer], Applications  [Encouraged]  Labs Reviewed Hgb A1c  [Reviewed Results]  Mental Health Interventions   Mental Health Discussed/Reviewed Mental Health Discussed, Substance Abuse, Suicide, Mental Health Reviewed, Other, Coping Strategies, Crisis, Anxiety, Depression, Grief and Loss  [Confirmed Absence from Domestic Violence]  Nutrition Interventions   Nutrition Discussed/Reviewed Nutrition Discussed, Portion sizes, Decreasing sugar intake, Nutrition Reviewed, Increasing proteins, Carbohydrate meal planning, Decreasing fats, Adding fruits and vegetables, Decreasing salt, Fluid intake  [Encouraged]  Pharmacy Interventions   Pharmacy Dicussed/Reviewed Pharmacy Topics Discussed, Affording Medications, Pharmacy Topics Reviewed, Medications and their functions, Medication Adherence  [Encouraged]  Medication Adherence --  [Complaint with Medications]  Safety Interventions   Safety Discussed/Reviewed Safety Discussed, Safety Reviewed, Fall Risk  [Encouraged]  Advanced Directive Interventions   Advanced Directives Discussed/Reviewed Advanced Directives Discussed, Advanced Directives Reviewed  [Encouraged Initiation, Offering Advanced Directives Packet]      Active Listening & Reflection Utilized.  Verbalization of Feelings Encouraged.  Emotional Support Provided. Problem  Solving Interventions Employed. Solution-Focused Strategies Implemented.  Cognitive Behavioral Therapy Initiated. Acceptance & Commitment Therapy Indicated. Client-Centered Therapy Performed. [id number] Placed to [facility] Guide, Via Routed Order in [employer], [employer] with [employer], As Handicapped Accessible Zenaida Niece is Only Mode of Transportation. Encouraged Self-Enrollment in Debt Relief/Management Program, Provided through [facility] [place] [phone]), to [employer], Counseling, Resources, [place]. Encouraged Routine Engagement with [name], Licensed Clinical Social Worker with [facility] [phone]), if You Have Questions, Need Assistance, or If Additional Social Work Needs Are Identified Between Now & Our Next Follow-Up Outreach Call, Scheduled on 09/16/2023 at 9:45 AM.   SDOH assessments and interventions completed:  Yes.  SDOH Interventions Today    Flowsheet Row Most Recent Value  SDOH Interventions   Food Insecurity Interventions Intervention Not Indicated  Housing Interventions Intervention Not Indicated  Transportation Interventions AMB Referral  [Patient's Van Needs Repairs & Unable to Afford]  Utilities Interventions Intervention Not Indicated  Alcohol Usage Interventions Intervention Not Indicated (Score <7)  Depression Interventions/Treatment  Medication, Counseling, Currently on Treatment  Financial Strain Interventions [id number] Referral  [employer] Assistance with Vehicle Repairs]  Physical Activity Interventions Intervention Not Indicated  Stress Interventions Offered [employer], Provide Counseling, Other (Comment)  [Provided Counseling Agencies, Services & Resources]  Social Connections Interventions Intervention Not Indicated  Health Literacy Interventions Intervention Not Indicated     Care Coordination Interventions:  Yes, provided.   Follow up plan: Follow up call  scheduled for 09/16/2023 at 9:45 am.  Encounter Outcome:  Patient Visit Completed.   [name], BSW, MSW, Printmaker Social Work Case Set designer Health  [facility], Population Health Direct Dial: [phone]  Fax: [phone] Email: [email or web address] Website: [email or web address]

## 2023-08-23 NOTE — Patient Instructions (Addendum)
Visit Information  Thank you for taking time to visit with me today. Please don't hesitate to contact me if I can be of assistance to you.   Following are the goals we discussed today:   Goals Addressed               This Visit's Progress     Find Help in My Community. (pt-stated)   On track     Care Coordination Interventions:  Interventions Today    Flowsheet Row Most Recent Value  Chronic Disease   Chronic disease during today's visit Chronic Obstructive Pulmonary Disease (COPD), Hypertension (HTN), Other  [Multiple Sclerosis, Paraplegia, Spasticity, Polysubstance Abuse, Generalized Weakness & Chronic Pain.]  General Interventions   General Interventions Discussed/Reviewed General Interventions Discussed, Lipid Profile, Doctor Visits, Durable Medical Equipment (DME), Communication with, Level of Care, Vaccines, General Interventions Reviewed, Annual Eye Exam, Health Screening, Labs, Annual Foot Exam, Community Resources  [Communication with Care Team Members]  Labs Hgb A1c every 3 months, Kidney Function  [Encouraged]  Vaccines COVID-19, Flu, Pneumonia, RSV, Shingles, Tetanus/Pertussis/Diphtheria  [Encouraged]  Doctor Visits Discussed/Reviewed Doctor Visits Discussed, Doctor Visits Reviewed, Annual Wellness Visits, PCP, Specialist  [Encouraged]  Health Screening Bone Density, Colonoscopy, Prostate  [Encouraged]  Durable Medical Equipment (DME) Bed side commode, BP Cuff, Walker, Wheelchair, Other  [Prescription Glasses, Sports administrator, Scales, Hand-Held Shower [place], Building surveyor, Chiropodist, Statistician, Medical laboratory scientific officer, Engineer, civil (consulting), Motorized  PCP/Specialist Visits Compliance with follow-up visit  [Encouraged]  Communication with PCP/Specialists, Charity fundraiser, Pharmacists, Social Work  [Encouraged]  Exercise Interventions   Exercise Discussed/Reviewed Exercise Discussed, Exercise Reviewed, Physical Activity, Weight Managment, Assistive device use and maintanence  [Encouraged]   Physical Activity Discussed/Reviewed Physical Activity Discussed, Physical Activity Reviewed, Types of exercise, Home Exercise Program (HEP), PREP, Gym  [Encouraged]  Weight Management Weight loss  [Encouraged]  Education Interventions   Education Provided Provided Education  Provided Verbal Education On Nutrition, Exercise, Foot Care, Medication, When to see the doctor, Eye Care, Labs, Blood Sugar Monitoring, Mental Health/Coping with Illness, [employer], [employer], Applications  [Encouraged]  Labs Reviewed Hgb A1c  [Reviewed Results]  Mental Health Interventions   Mental Health Discussed/Reviewed Mental Health Discussed, Substance Abuse, Suicide, Mental Health Reviewed, Other, Coping Strategies, Crisis, Anxiety, Depression, Grief and Loss  [Confirmed Absence from Domestic Violence]  Nutrition Interventions   Nutrition Discussed/Reviewed Nutrition Discussed, Portion sizes, Decreasing sugar intake, Nutrition Reviewed, Increasing proteins, Carbohydrate meal planning, Decreasing fats, Adding fruits and vegetables, Decreasing salt, Fluid intake  [Encouraged]  Pharmacy Interventions   Pharmacy Dicussed/Reviewed Pharmacy Topics Discussed, Affording Medications, Pharmacy Topics Reviewed, Medications and their functions, Medication Adherence  [Encouraged]  Medication Adherence --  [Complaint with Medications]  Safety Interventions   Safety Discussed/Reviewed Safety Discussed, Safety Reviewed, Fall Risk  [Encouraged]  Advanced Directive Interventions   Advanced Directives Discussed/Reviewed Advanced Directives Discussed, Advanced Directives Reviewed  [Encouraged Initiation, Offering Advanced Directives Packet]      Active Listening & Reflection Utilized.  Verbalization of Feelings Encouraged.  Emotional Support Provided. Problem Solving Interventions Employed. Solution-Focused Strategies Implemented.  Cognitive Behavioral Therapy Initiated. Acceptance & Commitment Therapy  Indicated. Client-Centered Therapy Performed. [id number] Placed to [facility] Guide, Via Routed Order in [employer], [employer] with [employer], As Handicapped Accessible Zenaida Niece is Only Mode of Transportation. Encouraged Self-Enrollment in Debt Relief/Management Program, Provided through [facility] [place] [phone]), to [employer], Counseling, Resources, [place]. Encouraged Routine Engagement with [name], Licensed Clinical Social Worker with [facility] [phone]), if  You Have Questions, Need Assistance, or If Additional Social Work Needs Are Identified Between Now & Our Next Follow-Up Outreach Call, Scheduled on 09/16/2023 at 9:45 AM.           Our next appointment is by telephone on 09/16/2023 at 9:45 am.  Please call the care guide team at [phone] if you need to cancel or reschedule your appointment.   If you are experiencing a Mental Health or Behavioral Health Crisis or need someone to talk to, please call the Suicide and Crisis Lifeline: 988 call the [place] National Suicide Prevention Lifeline: (361)562-5891 or TTY: 312 201 4498 TTY 442-318-4763) to talk to a trained counselor call 1-800-273-TALK (toll free, 24 hour hotline) go to [facility] [address], [place] [phone]) call the [facility] Crisis Line: [phone] call 911  Patient verbalizes understanding of instructions and care plan provided today and agrees to view in MyChart. Active MyChart status and patient understanding of how to access instructions and care plan via MyChart confirmed with patient.     Telephone follow up appointment with care management team member scheduled for:  09/16/2023 at 9:45 am.   [name], BSW, MSW, LCSW  Embedded Practice Social Work Case Manager  [facility], Population Health Direct Dial: [phone]  Fax: [phone] Email:  [email or web address] Website: [email or web address]

## 2023-08-24 ENCOUNTER — Telehealth: Payer: Self-pay

## 2023-08-24 NOTE — Telephone Encounter (Signed)
   Telephone encounter was:  Unsuccessful.  08/24/2023 Name: Chad Vasquez MRN: [id number] DOB: May 22, 1961  Unsuccessful outbound call made today to assist with:  Transportation Needs   Outreach Attempt:  1st Attempt  A HIPAA compliant voice message was left requesting a return call.  Instructed patient to call back at [phone].  [name] Sharol [facility]  [facility] Guide Direct Dial: [phone]  Website: [email or web address]

## 2023-08-25 ENCOUNTER — Ambulatory Visit: Payer: Self-pay | Admitting: *Deleted

## 2023-08-25 ENCOUNTER — Telehealth: Payer: Self-pay

## 2023-08-25 ENCOUNTER — Encounter: Payer: Self-pay | Admitting: *Deleted

## 2023-08-25 NOTE — Telephone Encounter (Signed)
   Telephone encounter was:  Successful.  08/25/2023 Name: BRYDON WENZINGER MRN: [id number] DOB: 1960-12-09  KEMONI RUDELL is a 62 y.o. year old male who is a primary care patient of Assunta Found, MD . The community resource team was consulted for assistance with Transportation Needs  and vehicle modification.  Care guide performed the following interventions: Spoke with patient about [facility], [facility] and [place] [facility] MS Navigator. Patient stated he has a conversion van but needs hand controls installed.   Follow Up Plan:  Care guide will outreach resources to assist patient with transportation, vehicle modification. Sent [id number] referral to Independent Living and [facility].   Jamont Mellin [facility]  [facility], [facility] Guide Direct Dial: [phone]  Website: [email or web address]

## 2023-08-25 NOTE — Patient Outreach (Signed)
Care Coordination   Follow Up Visit Note   08/25/2023 Name: Chad Vasquez MRN: [id number] DOB: 08/08/1961  Chad Vasquez is a 62 y.o. year old male who sees Assunta Found, MD for primary care. I spoke with  Chad Vasquez by phone today.  What matters to the patients health and wellness today?  Managing symptoms associated with multiple sclerosis and neuropathy. Patient will reach back out to community care guide regarding transportation needs.    Goals Addressed             This Visit's Progress    COMPLETED: Manage Multiple Sclerosis   On track    Care Coordination Goals: Patient will keep all medical appointments Patient will reach out to provider with any new or worsening symptoms Patient will continue to use assistive devices in order to continue to perform ADLs and IADLs Wheelchair, shower bench chair, grab bars around toilet Patient will continue daily exercise with seated elliptical machine  Patient does not have any acute or urgent needs related to this goal and will follow-up with PCP regarding management.      COMPLETED: Manage Neuropathy   On track    Care Coordination Goals: Patient will continue to take Lyrica at bedtime and up to 3 times a day as prescribed Patient will reach out to provider with any new or worsening symptoms Patient will maintain b12 injections Patient will continue other mediations as prescribed Patient will keep f/u appointments with providers 10/12/23 Dr Epimenio Foot (neurology) Patient will continue to use seated elliptical daily to improve strength and mobility  Patient does not have any acute or urgent needs related to this goal and will follow-up with PCP regarding management.          SDOH assessments and interventions completed:  Yes SDOH Interventions Today    Flowsheet Row Most Recent Value  SDOH Interventions   Transportation Interventions Other (Comment)  [Amb referral was placed for assistance with handicap van repairs.  Patient was outreached by community care guide but was unavailable. He has called back and left a message requesting a return call.]       Care Coordination Interventions:  Yes, provided  Interventions Today    Flowsheet Row Most Recent Value  Chronic Disease   Chronic disease during today's visit Other  [multiple sclerosis, neuropathy]  General Interventions   General Interventions Discussed/Reviewed General Interventions Discussed, General Interventions Reviewed, Doctor Visits, Durable Medical Equipment (DME)  Doctor Visits Discussed/Reviewed Doctor Visits Discussed, Doctor Visits Reviewed, PCP, Specialist, Annual Wellness Visits  Durable Medical Equipment (DME) Bed side commode, Wheelchair  Wheelchair Standard, Motorized  PCP/Specialist Visits Compliance with follow-up visit  [Dr Epimenio Foot 10/12/23. Follow-up with PCP as planned and with any care management needs.]  Exercise Interventions   Exercise Discussed/Reviewed Exercise Discussed, Exercise Reviewed, Physical Activity, Assistive device use and maintanence  Physical Activity Discussed/Reviewed Physical Activity Discussed, Physical Activity Reviewed  [continue to use pedal bike daily]  Education Interventions   Education Provided Provided Education  Provided Verbal Education On When to see the doctor, Exercise, Medication  Pharmacy Interventions   Pharmacy Dicussed/Reviewed Pharmacy Topics Discussed, Pharmacy Topics Reviewed, Medications and their functions  [taking medications as prescribed]  Safety Interventions   Safety Discussed/Reviewed Safety Discussed, Safety Reviewed, Fall Risk, Home Safety  Home Safety Assistive Devices       Follow up plan: No further intervention required. Patient's Primary Care office is not partnering with the [facility] for care management and will be providing Care Management  Services themselves. This final Care Management note will be securely faxed to the PCP office for handoff. Patient has been encouraged  to reach out to their PCP office with any resource or care management needs.   Encounter Outcome:  Patient Visit Completed   Demetrios Loll, RN, BSN Care Management Coordinator [facility]  [facility] Direct Dial: [phone] Main #: [phone]

## 2023-08-30 ENCOUNTER — Telehealth: Payer: Self-pay

## 2023-08-30 NOTE — Telephone Encounter (Signed)
   Telephone encounter was:  Successful.  08/30/2023 Name: Chad Vasquez MRN: [id number] DOB: 1961/03/28  [place] BUGGY is a 62 y.o. year old male who is a primary care patient of Assunta Found, MD . The community resource team was consulted for assistance with Transportation Needs   Care guide performed the following interventions: Spoke with Terance Hart at [facility] to follow-up  on [facility] referral sent 08/25/23. Terance Hart stated she would have Margaretann Loveless or Jamison Oka return my call regarding status of referral.   Follow Up Plan:  Care guide will follow up with patient by phone over the next 7 days.  Taylor Levick [facility]  [facility], [facility] Guide Direct Dial: [phone]  Website: [email or web address]

## 2023-08-30 NOTE — Telephone Encounter (Signed)
   Telephone encounter was:  Successful.  08/30/2023 Name: [name] MRN: [id number] DOB: 29-May-1961  Chad Vasquez is a 62 y.o. year old male who is a primary care patient of Assunta Found, MD . The community resource team was consulted for assistance with  vehicle modification.  Care guide performed the following interventions: Received voicemail message from patient confirming he has been contacted by [facility] has completed the phone screening and is awaiting his packet of information.   Follow Up Plan:  No further follow up planned at this time. The patient has been provided with needed resources.  Quintin Hjort [facility]  [facility], [facility] Guide Direct Dial: [phone]  Website: [email or web address]

## 2023-08-30 NOTE — Telephone Encounter (Signed)
   Telephone encounter was:  Unsuccessful.  08/30/2023 Name: LIANDRO LANKA MRN: [id number] DOB: 07-21-1961  Unsuccessful outbound call made today to assist with:  Left message on voicemail to update patient on the status of NCCARE360 referrals sent to [facility] and [facility].  Received email confirmation from Meggan Odell at [facility], she has accepted the patient's referral.  Received email confirmation from Jamison Oka at [facility] she has accepted the referral and will contact the patient.   Outreach Attempt:  3rd Attempt.  Referral closed unable to contact patient.  A HIPAA compliant voice message was left requesting a return call.  Instructed patient to call back at [phone].  Lindsy Cerullo [facility]  [facility], [facility] Guide Direct Dial: [phone]  Website: [email or web address]

## 2023-08-30 NOTE — Telephone Encounter (Signed)
   Telephone encounter was:  Successful.  08/30/2023 Name: Chad Vasquez MRN: [id number] DOB: October 20, 1960  Chad Vasquez is a 62 y.o. year old male who is a primary care patient of Assunta Found, MD . The community resource team was consulted for assistance with Transportation Needs   Care guide performed the following interventions: Left message for [name] at [facility] to follow-up on status of NCCARE360 referral sent 08/25/23.  Follow Up Plan:  Care guide will follow up with patient by phone over the next 7 days.  [name]  [facility], [facility] Guide Direct Dial: [phone]  Website: [email or web address]

## 2023-09-16 ENCOUNTER — Ambulatory Visit: Payer: Self-pay | Admitting: *Deleted

## 2023-09-16 NOTE — Patient Outreach (Signed)
Care Coordination   Follow Up Visit Note   09/16/2023  Name: Chad Vasquez MRN: [id number] DOB: 16-Jun-1961  OLE DEBOLD is a 62 y.o. year old male who sees Assunta Found, MD for primary care. I spoke with Chad Vasquez by phone today.  What matters to the patients health and wellness today?  Find Help in My Community.    Goals Addressed               This Visit's Progress     Find Help in My Community. (pt-stated)   On track     Care Coordination Interventions:   Interventions Today    Flowsheet Row Most Recent Value  Chronic Disease   Chronic disease during today's visit Chronic Obstructive Pulmonary Disease (COPD), Hypertension (HTN), Other  [Multiple Sclerosis, Paraplegia, Spasticity, Polysubstance Abuse, Generalized Weakness, Non-Ambulatory & Chronic Pain.]  General Interventions   General Interventions Discussed/Reviewed General Interventions Discussed, General Interventions Reviewed, Durable Medical Equipment (DME), Health Screening, Communication with, Doctor Visits, [facility], Level of Care, Vaccines  Vaccines COVID-19, Flu, Pneumonia, RSV, Shingles, Tetanus/Pertussis/Diphtheria  [Encouraged Routine Vaccinations.]  Doctor Visits Discussed/Reviewed Doctor Visits Discussed, Doctor Visits Reviewed, Annual Wellness Visits, PCP, Specialist  [Encouraged Routine Engagement.]  Health Screening Bone Density, Colonoscopy, Prostate  [Encouraged Annual Health Screenings.]  Durable Medical Equipment (DME) Bed side commode, BP Cuff, Other, Environmental consultant, Airline pilot, Sports administrator, Scales, Manufacturing systems engineer, Building surveyor, Chiropodist, Statistician, Medical laboratory scientific officer, Walker.]  Probation officer, Motorized  [Encouraged Routine Use of Assistive Devices.]  PCP/Specialist Visits Compliance with follow-up visit  [Encouraged Routine Engagement.]  Communication with PCP/Specialists, Charity fundraiser, Pharmacists, Social Work  [employer] Routine Engagement.]  Level of Care  Adult Daycare, Air traffic controller, Assisted Living, Skilled Nursing Facility  [Confirmed Disinterest in Enrollment in Adult Day Care Program or Receiving Assistance Pursuing Higher Level of Care Placement Options (I.e Assisted Living Versus Skilled Nursing Facility).]  Applications Medicaid, Personal Care Services  [Confirmed Ineligibility for Full Adult Medicaid Coverage & Inability to Apply or Qualify for Personal Care Services.]  Exercise Interventions   Exercise Discussed/Reviewed Exercise Discussed, Assistive device use and maintanence, Exercise Reviewed, Physical Activity, Weight Managment  [Encouraged Routine Engagement in Activities of Interest, Inside & Outside the Home.]  Physical Activity Discussed/Reviewed Physical Activity Discussed, PREP, Gym, Physical Activity Reviewed, Types of exercise, Home Exercise Program (HEP)  [Encouraged Daily Exercise Regimen.]  Weight Management Weight loss  [Encouraged Healthy Weight Loss.]  Education Interventions   Education Provided Provided Education  [Thoroughly Reviewed & Encouraged Consideration.]  Provided Verbal Education On Nutrition, Programmer, applications, Mental Health/Coping with Illness, When to see the doctor, Medication, Exercise, Applications, Development worker, community  [Encouraged Consideration & Implementation.]  Ship broker, Personal Care Services  [Confirmed Ineligibility for Full Adult Medicaid Coverage & Inability to Apply or Qualify for Personal Care Services.]  Mental Health Interventions   Mental Health Discussed/Reviewed Mental Health Discussed, Mental Health Reviewed, Coping Strategies, Crisis, Anxiety, Depression, Grief and Loss, Substance Abuse, Suicide, Other  [Assessed Mental Health & Cognitive Status.]  Nutrition Interventions   Nutrition Discussed/Reviewed Nutrition Discussed, Fluid intake, Decreasing salt, Portion sizes, Nutrition Reviewed, Carbohydrate meal planning, Decreasing sugar intake, Increasing proteins, Adding fruits and  vegetables, Decreasing fats  [Encouraged Heart-Healthy, Low Fat, Low Sodium, Fiber-Rich, Reduced Sugar Diet.]  Pharmacy Interventions   Pharmacy Dicussed/Reviewed Pharmacy Topics Discussed, Medication Adherence, Affording Medications, Pharmacy Topics Reviewed, Medications and their functions  [Confirmed Prescription Medication Compliance.]  Medication Adherence --  [Confirmed Ability to Afford Prescription Medications.]  Safety Interventions  Safety Discussed/Reviewed Safety Discussed, Safety Reviewed, Fall Risk, Home Safety  [Encouraged Routine Use of Home Assistice Devices.]  Home Safety Assistive Devices, Need for home safety assessment  [Encouraged Consideration of Home Safety Evaluation.]  Advanced Directive Interventions   Advanced Directives Discussed/Reviewed Advanced Directives Discussed, Advanced Directives Reviewed  [Encouraged Initiation of Advanced Directives (Living Will & Healthcare Power of Corporate treasurer), Offering to [employer], Assist with Completion & Scan Copies into Electronic Medical Record in Epic.]      Active Listening & Reflection Utilized.  Verbalization of Feelings Encouraged.  Emotional Support Provided. Cognitive Behavioral Therapy Implemented. Acceptance & Commitment Therapy Indicated. Client-Centered Therapy Initiated. Encouraged Routine Engagement with Jamison Oka, Case Manager with [facility] of [facility] & Human Services [phone]), to Check Status of Name on Waiting List to Receive Financial Assistance with Vehicle Repairs, As Handicapped Accessible Zenaida Niece is Only Mode of Transportation. Encouraged Routine Engagement with [name], Licensed Clinical Social Worker with [facility] [phone]), if You Have Questions, Need Assistance, or If Additional Social Work Needs Are Identified Between Now & Our Next Follow-Up Outreach Call, Scheduled on 10/07/2023 at 1:45 PM.      SDOH assessments and interventions  completed:  Yes.  Care Coordination Interventions:  Yes, provided.   Follow up plan: Follow up call scheduled for 10/07/2023 at 1:45 pm.  Encounter Outcome:  Patient Visit Completed.   [name], BSW, MSW, Printmaker Social Work Case Set designer Health  [facility], Population Health Direct Dial: [phone]  Fax: [phone] Email: [email or web address]: [email or web address]

## 2023-09-16 NOTE — Patient Instructions (Signed)
Visit Information  Thank you for taking time to visit with me today. Please don't hesitate to contact me if I can be of assistance to you.   Following are the goals we discussed today:   Goals Addressed               This Visit's Progress     Find Help in My Community. (pt-stated)   On track     Care Coordination Interventions:   Interventions Today    Flowsheet Row Most Recent Value  Chronic Disease   Chronic disease during today's visit Chronic Obstructive Pulmonary Disease (COPD), Hypertension (HTN), Other  [Multiple Sclerosis, Paraplegia, Spasticity, Polysubstance Abuse, Generalized Weakness, Non-Ambulatory & Chronic Pain.]  General Interventions   General Interventions Discussed/Reviewed General Interventions Discussed, General Interventions Reviewed, Durable Medical Equipment (DME), Health Screening, Communication with, Doctor Visits, [facility], Level of Care, Vaccines  Vaccines COVID-19, Flu, Pneumonia, RSV, Shingles, Tetanus/Pertussis/Diphtheria  [Encouraged Routine Vaccinations.]  Doctor Visits Discussed/Reviewed Doctor Visits Discussed, Doctor Visits Reviewed, Annual Wellness Visits, PCP, Specialist  [Encouraged Routine Engagement.]  Health Screening Bone Density, Colonoscopy, Prostate  [Encouraged Annual Health Screenings.]  Durable Medical Equipment (DME) Bed side commode, BP Cuff, Other, Environmental consultant, Airline pilot, Sports administrator, Scales, Manufacturing systems engineer, Building surveyor, Chiropodist, Statistician, Medical laboratory scientific officer, Walker.]  Probation officer, Motorized  [Encouraged Routine Use of Assistive Devices.]  PCP/Specialist Visits Compliance with follow-up visit  [Encouraged Routine Engagement.]  Communication with PCP/Specialists, Charity fundraiser, Pharmacists, Social Work  [employer] Routine Engagement.]  Level of Care Adult Daycare, Air traffic controller, Assisted Living, Skilled Nursing Facility  [Confirmed Disinterest in Enrollment in Adult Day Care Program or Receiving  Assistance Pursuing Higher Level of Care Placement Options (I.e Assisted Living Versus Skilled Nursing Facility).]  Applications Medicaid, Personal Care Services  [Confirmed Ineligibility for Full Adult Medicaid Coverage & Inability to Apply or Qualify for Personal Care Services.]  Exercise Interventions   Exercise Discussed/Reviewed Exercise Discussed, Assistive device use and maintanence, Exercise Reviewed, Physical Activity, Weight Managment  [Encouraged Routine Engagement in Activities of Interest, Inside & Outside the Home.]  Physical Activity Discussed/Reviewed Physical Activity Discussed, PREP, Gym, Physical Activity Reviewed, Types of exercise, Home Exercise Program (HEP)  [Encouraged Daily Exercise Regimen.]  Weight Management Weight loss  [Encouraged Healthy Weight Loss.]  Education Interventions   Education Provided Provided Education  [Thoroughly Reviewed & Encouraged Consideration.]  Provided Verbal Education On Nutrition, [facility], Mental Health/Coping with Illness, When to see the doctor, Medication, Exercise, Applications, Insurance Plans  [Encouraged Consideration & Implementation.]  Ship broker, Personal Care Services  [Confirmed Ineligibility for Full Adult Medicaid Coverage & Inability to Apply or Qualify for Personal Care Services.]  Mental Health Interventions   Mental Health Discussed/Reviewed Mental Health Discussed, Mental Health Reviewed, Coping Strategies, Crisis, Anxiety, Depression, Grief and Loss, Substance Abuse, Suicide, Other  [Assessed Mental Health & Cognitive Status.]  Nutrition Interventions   Nutrition Discussed/Reviewed Nutrition Discussed, Fluid intake, Decreasing salt, Portion sizes, Nutrition Reviewed, Carbohydrate meal planning, Decreasing sugar intake, Increasing proteins, Adding fruits and vegetables, Decreasing fats  [Encouraged Heart-Healthy, Low Fat, Low Sodium, Fiber-Rich, Reduced Sugar Diet.]  Pharmacy Interventions   Pharmacy  Dicussed/Reviewed Pharmacy Topics Discussed, Medication Adherence, Affording Medications, Pharmacy Topics Reviewed, Medications and their functions  [Confirmed Prescription Medication Compliance.]  Medication Adherence --  [Confirmed Ability to [employer] Prescription Medications.]  Safety Interventions   Safety Discussed/Reviewed Safety Discussed, Safety Reviewed, Fall Risk, Home Safety  [Encouraged Routine Use of Home Assistice Devices.]  Home Safety Assistive Devices, Need for home safety assessment  [  Encouraged Consideration of Home Safety Evaluation.]  Advanced Directive Interventions   Advanced Directives Discussed/Reviewed Advanced Directives Discussed, Advanced Directives Reviewed  [Encouraged Initiation of Advanced Directives (Living Will & Healthcare Power of Corporate treasurer), Offering to [employer], Assist with Completion & Scan Copies into Electronic Medical Record in Epic.]      Active Listening & Reflection Utilized.  Verbalization of Feelings Encouraged.  Emotional Support Provided. Cognitive Behavioral Therapy Implemented. Acceptance & Commitment Therapy Indicated. Client-Centered Therapy Initiated. Encouraged Routine Engagement with Jamison Oka, Case Manager with [facility] Department of Health & Human Services [phone]), to Check Status of Name on Waiting List to Receive Financial Assistance with Vehicle Repairs, As Handicapped Accessible Zenaida Niece is Only Mode of Transportation. Encouraged Routine Engagement with [name], Licensed Clinical Social Worker with [facility] [phone]), if You Have Questions, Need Assistance, or If Additional Social Work Needs Are Identified Between Now & Our Next Follow-Up Outreach Call, Scheduled on 10/07/2023 at 1:45 PM.      Our next appointment is by telephone on 10/07/2023 at 1:45 pm.  Please call the care guide team at [phone] if you need to cancel or reschedule your appointment.   If you  are experiencing a Mental Health or Behavioral Health Crisis or need someone to talk to, please call the Suicide and Crisis Lifeline: 988 call the [place] National Suicide Prevention Lifeline: (928)216-2548 or TTY: (972)749-4945 TTY 367-756-1918) to talk to a trained counselor call 1-800-273-TALK (toll free, 24 hour hotline) go to [facility] [address] [place] [phone]) call the [facility] Crisis Line: [phone] call 911  Patient verbalizes understanding of instructions and care plan provided today and agrees to view in MyChart. Active MyChart status and patient understanding of how to access instructions and care plan via MyChart confirmed with patient.     Telephone follow up appointment with care management team member scheduled for:  10/07/2023 at 1:45 pm.  [name], BSW, MSW, LCSW  Embedded Practice Social Work Case Manager  [facility], Population Health Direct Dial: [phone]  Fax: [phone] Email: [email or web address] Website: [email or web address]

## 2023-10-07 ENCOUNTER — Ambulatory Visit: Payer: Self-pay | Admitting: *Deleted

## 2023-10-07 NOTE — Patient Outreach (Signed)
  Care Coordination   Follow Up Visit Note   10/07/2023  Name: Chad Vasquez MRN: [id number] DOB: 1960/11/28  Chad Vasquez is a 63 y.o. year old male who sees Marvine Rush, MD for primary care. I spoke with Ubaldo LITTIE Lush by phone today.  What matters to the patients health and wellness today?  Find Help in My Community.    Goals Addressed               This Visit's Progress     Find Help in My Community. (pt-stated)   On track     Care Coordination Interventions:  Interventions Today    Flowsheet Row Most Recent Value  Chronic Disease   Chronic disease during today's visit Chronic Obstructive Pulmonary Disease (COPD), Hypertension (HTN), Other  [Multiple Sclerosis, Paraplegia, Spasticity, Polysubstance Abuse, Generalized Weakness, Non-Ambulatory, Chronic Pain, Caregiver Stress & Fatigue, Hyperlipidemia, Sarcoidosis, Depression.]  General Interventions   General Interventions Discussed/Reviewed General Interventions Discussed, Labs, Vaccines, Doctor Visits, Health Screening, Annual Foot Exam, Lipid Profile, General Interventions Reviewed, Annual Eye Exam, Durable Medical Equipment (DME), Community Resources, Level of Care, Communication with  [Encouraged Routine Engagement with Care Team Members & Providers.]  Labs Hgb A1c every 3 months, Kidney Function  [Encouraged Routine Labwork.]  Vaccines COVID-19, Flu, Pneumonia, RSV, Shingles, Tetanus/Pertussis/Diphtheria  [Encouraged Annual Vaccinations.]  Doctor Visits Discussed/Reviewed Doctor Visits Discussed, Specialist, Doctor Visits Reviewed, Annual Wellness Visits, PCP  [Encouraged Routine Engagement with Care Team Members & Providers.]  Health Screening Bone Density, Colonoscopy, Prostate  [Encouraged Routine Health Screenings.]  Durable Medical Equipment (DME) Bed side commode, BP Cuff, Environmental Consultant, Wheelchair, Other  [Prescription Eyeglasses, Sports Administrator, Scales, Manufacturing Systems Engineer, Building Surveyor, Cane.]  Probation Officer, Motorized   [Encouraged Routine Use for Safety.]  PCP/Specialist Visits Compliance with follow-up visit  [Encouraged Routine Engagement with Care Team Members & Providers.]  Communication with PCP/Specialists, CHARITY FUNDRAISER, Pharmacists, Social Work  [employer] Routine Engagement with Care Team Members & Providers.]      Active Listening & Reflection Utilized.  Verbalization of Feelings Encouraged.  Emotional Support Provided. Cognitive Behavioral Therapy Initiated. Client-Centered Therapy Performed. Encouraged Routine Engagement with Ragina Fenter, Licensed Clinical Social Worker with [facility] [phone]), if You Have Questions, Need Assistance, or If Additional Social Work Needs Are Identified Between Now & Our Next Follow-Up Outreach Call, Scheduled on 10/24/2023 at 11:30 AM.      SDOH assessments and interventions completed:  Yes.  Care Coordination Interventions:  Yes, provided.   Follow up plan: Follow up call scheduled for 10/24/2023 at 11:30 am.  Encounter Outcome:  Patient Visit Completed.   Philippe Desanctis, BSW, MSW, Printmaker Social Work Case Set Designer Health  [facility], Population Health Direct Dial: [phone]  Fax: [phone] Email: [email or web address] Website: [email or web address]

## 2023-10-07 NOTE — Patient Instructions (Signed)
 Visit Information  Thank you for taking time to visit with me today. Please don't hesitate to contact me if I can be of assistance to you.   Following are the goals we discussed today:   Goals Addressed               This Visit's Progress     Find Help in My Community. (pt-stated)   On track     Care Coordination Interventions:  Interventions Today    Flowsheet Row Most Recent Value  Chronic Disease   Chronic disease during today's visit Chronic Obstructive Pulmonary Disease (COPD), Hypertension (HTN), Other  [Multiple Sclerosis, Paraplegia, Spasticity, Polysubstance Abuse, Generalized Weakness, Non-Ambulatory, Chronic Pain, Caregiver Stress & Fatigue, Hyperlipidemia, Sarcoidosis, Depression.]  General Interventions   General Interventions Discussed/Reviewed General Interventions Discussed, Labs, Vaccines, Doctor Visits, Health Screening, Annual Foot Exam, Lipid Profile, General Interventions Reviewed, Annual Eye Exam, Durable Medical Equipment (DME), Community Resources, Level of Care, Communication with  [Encouraged Routine Engagement with Care Team Members & Providers.]  Labs Hgb A1c every 3 months, Kidney Function  [Encouraged Routine Labwork.]  Vaccines COVID-19, Flu, Pneumonia, RSV, Shingles, Tetanus/Pertussis/Diphtheria  [Encouraged Annual Vaccinations.]  Doctor Visits Discussed/Reviewed Doctor Visits Discussed, Specialist, Doctor Visits Reviewed, Annual Wellness Visits, PCP  [Encouraged Routine Engagement with Care Team Members & Providers.]  Health Screening Bone Density, Colonoscopy, Prostate  [Encouraged Routine Health Screenings.]  Durable Medical Equipment (DME) Bed side commode, BP Cuff, Environmental Consultant, Wheelchair, Other  [Prescription Eyeglasses, Sports Administrator, Scales, Manufacturing Systems Engineer, Building Surveyor, Cane.]  Probation Officer, Motorized  [Encouraged Routine Use for Safety.]  PCP/Specialist Visits Compliance with follow-up visit  [Encouraged Routine Engagement with Care Team Members &  Providers.]  Communication with PCP/Specialists, CHARITY FUNDRAISER, Pharmacists, Social Work  [employer] Routine Engagement with Care Team Members & Providers.]      Active Listening & Reflection Utilized.  Verbalization of Feelings Encouraged.  Emotional Support Provided. Cognitive Behavioral Therapy Initiated. Client-Centered Therapy Performed. Encouraged Routine Engagement with Obed Samek, Licensed Clinical Social Worker with [facility] [phone]), if You Have Questions, Need Assistance, or If Additional Social Work Needs Are Identified Between Now & Our Next Follow-Up Outreach Call, Scheduled on 10/24/2023 at 11:30 AM.      Our next appointment is by telephone on 10/24/2023 at 11:30 am.  Please call the care guide team at [phone] if you need to cancel or reschedule your appointment.   If you are experiencing a Mental Health or Behavioral Health Crisis or need someone to talk to, please call the Suicide and Crisis Lifeline: 988 call the [place]  National Suicide Prevention Lifeline: 930-275-7255 or TTY: (530)111-3354 TTY 365 337 0617) to talk to a trained counselor call 1-800-273-TALK (toll free, 24 hour hotline) go to [facility] [address] [place] [phone]) call the [facility] Crisis Line: [phone] call 911  Patient verbalizes understanding of instructions and care plan provided today and agrees to view in MyChart. Active MyChart status and patient understanding of how to access instructions and care plan via MyChart confirmed with patient.     Telephone follow up appointment with care management team member scheduled for:  10/24/2023 at 11:30 am.  Philippe Desanctis, BSW, MSW, LCSW  Embedded Practice Social Work Case Manager  [facility], Population Health Direct Dial: [phone]  Fax: [phone] Email: [email or web address] Website: [email or web address]

## 2023-10-11 ENCOUNTER — Telehealth: Payer: Self-pay | Admitting: Neurology

## 2023-10-11 NOTE — Telephone Encounter (Signed)
 Pt r/s appt due to lack of transportation. Wait listed

## 2023-10-12 ENCOUNTER — Ambulatory Visit: Payer: Medicare Other | Admitting: Neurology

## 2023-10-24 ENCOUNTER — Ambulatory Visit: Payer: Self-pay | Admitting: *Deleted

## 2023-10-24 NOTE — Patient Instructions (Addendum)
Visit Information  Thank you for taking time to visit with me today. Please don't hesitate to contact me if I can be of assistance to you.   Following are the goals we discussed today:   Goals Addressed               This Visit's Progress     Find Help in My Community. (pt-stated)   On track     Care Coordination Interventions:  Interventions Today    Flowsheet Row Most Recent Value  Chronic Disease   Chronic disease during today's visit Chronic Obstructive Pulmonary Disease (COPD), Hypertension (HTN), Other  [Multiple Sclerosis, Paraplegia, Spasticity, Polysubstance Abuse, Generalized Weakness, Non-Ambulatory, Chronic Pain, Caregiver Stress & Fatigue, Hyperlipidemia, Sarcoidosis, Depression.]  General Interventions   General Interventions Discussed/Reviewed General Interventions Discussed, Labs, Vaccines, Doctor Visits, Health Screening, Annual Foot Exam, Lipid Profile, General Interventions Reviewed, Annual Eye Exam, Durable Medical Equipment (DME), Community Resources, Level of Care, Communication with  [Encouraged Routine Engagement with Care Team Members & Providers.]  Labs Hgb A1c every 3 months, Kidney Function  [Encouraged Routine Labwork.]  Vaccines COVID-19, Flu, Pneumonia, RSV, Shingles, Tetanus/Pertussis/Diphtheria  [Encouraged Annual Vaccinations.]  Doctor Visits Discussed/Reviewed Doctor Visits Discussed, Specialist, Doctor Visits Reviewed, Annual Wellness Visits, PCP  [Encouraged Routine Engagement with Care Team Members & Providers.]  Health Screening Bone Density, Colonoscopy, Prostate  [Encouraged Routine Health Screenings.]  Durable Medical Equipment (DME) Bed side commode, BP Cuff, Environmental consultant, Wheelchair, Other  [Prescription Eyeglasses, Sports administrator, Scales, Manufacturing systems engineer, Building surveyor, Cane.]  Probation officer, Motorized  [Encouraged Routine Use for Safety.]  PCP/Specialist Visits Compliance with follow-up visit  [Encouraged Routine Engagement with Care Team Members &  Providers.]  Communication with PCP/Specialists, Charity fundraiser, Pharmacists, Social Work  [employer] Routine Engagement with Care Team Members & Providers.]      Active Listening & Reflection Utilized.  Verbalization of Feelings Encouraged.  Emotional Support Provided. Cognitive Behavioral Therapy Initiated. Client-Centered Therapy Performed. Thoroughly Reviewed & Encouraged Implementation of Key Elements to Coping with A Debilitating Disability, Which Include All of The Following: Accepting Your Condition. Building a [employer]. Learning As Much As You Can About Your Disability. Managing Stress through Self-Care Practices. Utilizing [employer] Setting Realistic Goals. Focusing on What You Can Do. Seeking Professional Support When Needed. Embracing Your Situation While Actively Managing Challenges. Finding Ways to Maintain a Fulfilling Life.  Encouraged Routine Engagement with [name], Licensed Clinical Social Worker with [facility] [phone]), if You Have Questions, Need Assistance, or If Additional Social Work Needs Are Identified Between Now & Our Next Follow-Up Outreach Call, Scheduled on 11/23/2023 at 9:00 AM.      Our next appointment is by telephone on 11/23/2023 at 9:00 am.  Please call the care guide team at [phone] if you need to cancel or reschedule your appointment.   If you are experiencing a Mental Health or Behavioral Health Crisis or need someone to talk to, please call the Suicide and Crisis Lifeline: 988 call the [place] National Suicide Prevention Lifeline: 470-118-5206 or TTY: 579-729-2827 TTY (724)518-0495) to talk to a trained counselor call 1-800-273-TALK (toll free, 24 hour hotline) go to [facility] [address] [place] [phone]) call the [facility] Crisis Line: [phone] call 911  Patient verbalizes understanding of instructions and care plan provided today  and agrees to view in MyChart. Active MyChart status and patient understanding of how to access instructions and care plan via MyChart confirmed with patient.  Telephone follow up appointment with care management team member scheduled for:  11/23/2023 at 9:00 am.   [name], BSW, MSW, LCSW [place]  [facility], [facility] Clinical Social Worker II Direct Dial: [phone]  Fax: [phone] Website: [email or web address]

## 2023-10-24 NOTE — Patient Outreach (Signed)
  Care Coordination   Follow Up Visit Note   10/24/2023  Name: Chad Vasquez MRN: [id number] DOB: Jul 27, 1961  Chad Vasquez is a 62 y.o. year old male who sees Assunta Found, MD for primary care. [name]  What matters to the patients health and wellness today?  Find Help in My Community.    Goals Addressed               This Visit's Progress     Find Help in My Community. (pt-stated)   On track     Care Coordination Interventions:  Interventions Today    Flowsheet Row Most Recent Value  Chronic Disease   Chronic disease during today's visit Chronic Obstructive Pulmonary Disease (COPD), Hypertension (HTN), Other  [Multiple Sclerosis, Paraplegia, Spasticity, Polysubstance Abuse, Generalized Weakness, Non-Ambulatory, Chronic Pain, Caregiver Stress & Fatigue, Hyperlipidemia, Sarcoidosis, Depression.]  General Interventions   General Interventions Discussed/Reviewed General Interventions Discussed, Labs, Vaccines, Doctor Visits, Health Screening, Annual Foot Exam, Lipid Profile, General Interventions Reviewed, Annual Eye Exam, Durable Medical Equipment (DME), Community Resources, Level of Care, Communication with  [Encouraged Routine Engagement with Care Team Members & Providers.]  Labs Hgb A1c every 3 months, Kidney Function  [Encouraged Routine Labwork.]  Vaccines COVID-19, Flu, Pneumonia, RSV, Shingles, Tetanus/Pertussis/Diphtheria  [Encouraged Annual Vaccinations.]  Doctor Visits Discussed/Reviewed Doctor Visits Discussed, Specialist, Doctor Visits Reviewed, Annual Wellness Visits, PCP  [Encouraged Routine Engagement with Care Team Members & Providers.]  Health Screening Bone Density, Colonoscopy, Prostate  [Encouraged Routine Health Screenings.]  Durable Medical Equipment (DME) Bed side commode, BP Cuff, Environmental consultant, Wheelchair, Other  [Prescription Eyeglasses, Sports administrator, Scales, Manufacturing systems engineer, Building surveyor, Cane.]  Probation officer, Motorized   [Encouraged Routine Use for Safety.]  PCP/Specialist Visits Compliance with follow-up visit  [Encouraged Routine Engagement with Care Team Members & Providers.]  Communication with PCP/Specialists, Charity fundraiser, Pharmacists, Social Work  [employer] Routine Engagement with Care Team Members & Providers.]      Active Listening & Reflection Utilized.  Verbalization of Feelings Encouraged.  Emotional Support Provided. Cognitive Behavioral Therapy Initiated. Client-Centered Therapy Performed. Thoroughly Reviewed & Encouraged Implementation of Key Elements to Coping with A Debilitating Disability, Which Include All of The Following: Accepting Your Condition. Building a [employer]. Learning As Much As You Can About Your Disability. Managing Stress through Self-Care Practices. Utilizing [employer] Setting Realistic Goals. Focusing on What You Can Do. Seeking Professional Support When Needed. Embracing Your Situation While Actively Managing Challenges. Finding Ways to Maintain a Fulfilling Life.  Encouraged Routine Engagement with [name], Licensed Clinical Social Worker with [facility] [phone]), if You Have Questions, Need Assistance, or If Additional Social Work Needs Are Identified Between Now & Our Next Follow-Up Outreach Call, Scheduled on 11/23/2023 at 9:00 AM.      SDOH assessments and interventions completed:  Yes.  Care Coordination Interventions:  Yes, provided.   Follow up plan: Follow up call scheduled for 11/23/2023 at 9:00 am.  Encounter Outcome:  Patient Visit Completed.   [name], BSW, MSW, LCSW [facility], [facility] Clinical Social Worker II Direct Dial: [phone]  Fax: [phone] Website: [email or web address]

## 2023-11-08 ENCOUNTER — Telehealth: Payer: Self-pay | Admitting: Neurology

## 2023-11-08 ENCOUNTER — Encounter: Payer: Self-pay | Admitting: *Deleted

## 2023-11-08 NOTE — Telephone Encounter (Signed)
Faxed letter, received fax confirmation. 

## 2023-11-08 NOTE — Telephone Encounter (Signed)
 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 He is working with Delon Charles with [facility] in [place] (ph [phone] fax [phone]) pt is asking a letter be faxed to her stating he is medically cleared to be be evaluated for hand controls for his fleeta.

## 2023-11-08 NOTE — Telephone Encounter (Addendum)
Spoke w/ Dr. Epimenio Foot. He approved writing letter. Letter printed, waiting on MD signature.   Called pt and LVM providing update. Aware we will fax in once MD signs letter.

## 2023-11-15 ENCOUNTER — Other Ambulatory Visit: Payer: Self-pay | Admitting: Neurology

## 2023-11-16 ENCOUNTER — Telehealth: Payer: Self-pay | Admitting: *Deleted

## 2023-11-16 ENCOUNTER — Other Ambulatory Visit: Payer: Self-pay

## 2023-11-16 NOTE — Progress Notes (Signed)
Complex Care Management Care Guide Note  11/16/2023 Name: ADIL TUGWELL MRN: [id number] DOB: 1960-12-25  BOLDEN HAGERMAN is a 63 y.o. year old male who is a primary care patient of Assunta Found, MD and is actively engaged with the care management team. I reached out to [name] by phone today to assist with re-scheduling  with the Licensed Clinical Child psychotherapist.  Follow up plan: Telephone appointment with complex care management team member scheduled for:  2/24  Gwenevere Ghazi  [facility] Health  [facility] Guide  Direct Dial: [phone]  Fax [phone]

## 2023-11-16 NOTE — Progress Notes (Signed)
Complex Care Management Care Guide Note  11/16/2023 Name: Chad Vasquez MRN: [id number] DOB: March 19, 1961  Chad Vasquez is a 63 y.o. year old male who is a primary care patient of Assunta Found, MD and is actively engaged with the care management team. I reached out to [name] by phone today to assist with re-scheduling  with the Licensed Clinical Child psychotherapist.  Follow up plan: Unsuccessful telephone outreach attempt made. A HIPAA compliant phone message was left for the patient providing contact information and requesting a return call.  Gwenevere Ghazi  [facility] Health  [facility], [facility] Guide  Direct Dial: [phone]  Fax [phone]

## 2023-11-17 NOTE — Telephone Encounter (Signed)
Last seen 03/23/23 and next f/u 04/19/24. Last refilled tramadol 09/02/23 #240.

## 2023-11-23 ENCOUNTER — Encounter: Payer: Self-pay | Admitting: *Deleted

## 2023-11-28 ENCOUNTER — Encounter: Payer: Self-pay | Admitting: *Deleted

## 2023-11-28 ENCOUNTER — Ambulatory Visit: Payer: Self-pay | Admitting: *Deleted

## 2023-11-28 NOTE — Patient Instructions (Signed)
 Visit Information  Thank you for taking time to visit with me today. Please don't hesitate to contact me if I can be of assistance to you.   Following are the goals we discussed today:   Goals Addressed               This Visit's Progress     COMPLETED: Find Help in My Community. (pt-stated)   On track     Care Coordination Interventions:  Interventions Today    Flowsheet Row Most Recent Value  Chronic Disease   Chronic disease during today's visit Chronic Obstructive Pulmonary Disease (COPD), Hypertension (HTN), Other  [Multiple Sclerosis, Paraplegia, Spasticity, Polysubstance Abuse, Generalized Weakness, Non-Ambulatory, Chronic Pain, Caregiver Stress & Fatigue, Hyperlipidemia, Sarcoidosis, Depression.]  General Interventions   General Interventions Discussed/Reviewed General Interventions Discussed, Labs, Vaccines, Doctor Visits, Health Screening, Annual Foot Exam, Lipid Profile, General Interventions Reviewed, Annual Eye Exam, Durable Medical Equipment (DME), Community Resources, Level of Care, Communication with  [Encouraged Routine Engagement with Care Team Members & Providers.]  Labs Hgb A1c every 3 months, Kidney Function  [Encouraged Routine Labwork.]  Vaccines COVID-19, Flu, Pneumonia, RSV, Shingles, Tetanus/Pertussis/Diphtheria  [Encouraged Annual Vaccinations.]  Doctor Visits Discussed/Reviewed Doctor Visits Discussed, Specialist, Doctor Visits Reviewed, Annual Wellness Visits, PCP  [Encouraged Routine Engagement with Care Team Members & Providers.]  Health Screening Bone Density, Colonoscopy, Prostate  [Encouraged Routine Health Screenings.]  Durable Medical Equipment (DME) Bed side commode, BP Cuff, Environmental consultant, Wheelchair, Other  [Prescription Eyeglasses, Sports administrator, Scales, Manufacturing systems engineer, Building surveyor, Cane.]  Probation officer, Motorized  [Encouraged Routine Use for Safety.]  PCP/Specialist Visits Compliance with follow-up visit  [Encouraged Routine Engagement with Care Team  Members & Providers.]  Communication with PCP/Specialists, Charity fundraiser, Pharmacists, Social Work  [employer] Routine Engagement with Care Team Members & Providers.]      Active Listening & Reflection Utilized.  Verbalization of Feelings Encouraged.  Emotional Support Provided. Cognitive Behavioral Therapy Indicated. Client-Centered Therapy Implemented. Acceptance & Commitment Therapy Initiated. Encouraged Engagement with [name], Licensed Clinical Social Worker with [facility], [facility] [phone]), if You Have Questions, Need Assistance, Additional Social Work Needs Are Identified in The Near Future, or If You Change Your Mind About Wanting to Receive Social Work Services.       Please call the care guide team at [phone] if you need to cancel or reschedule your appointment.   If you are experiencing a Mental Health or Behavioral Health Crisis or need someone to talk to, please call the Suicide and Crisis Lifeline: 988 call the [place] National Suicide Prevention Lifeline: 979-339-0232 or TTY: 386-614-9493 TTY (951)594-1720) to talk to a trained counselor call 1-800-273-TALK (toll free, 24 hour hotline) go to [facility] Urgent Care [address], [place] [phone]) call the [facility] Crisis Line: [phone] call 911  Patient verbalizes understanding of instructions and care plan provided today and agrees to view in MyChart. Active MyChart status and patient understanding of how to access instructions and care plan via MyChart confirmed with patient.     No further follow up required.  [name], BSW, MSW, LCSW [facility], [facility] Clinical Social Worker II Direct Dial: [phone]  Fax: [phone] Website: [email or web address]

## 2023-11-28 NOTE — Patient Outreach (Signed)
 Care Coordination   Follow Up Visit Note   11/28/2023  Name: Chad Vasquez MRN: [id number] DOB: [birth year]  Chad Vasquez is a 63 y.o. year old male who sees Chad Found, MD for primary care. I spoke with Chad Vasquez by phone today.  What matters to the patients health and wellness today?  Find Help in My Community.    Goals Addressed               This Visit's Progress     COMPLETED: Find Help in My Community. (pt-stated)   On track     Care Coordination Interventions:  Interventions Today    Flowsheet Row Most Recent Value  Chronic Disease   Chronic disease during today's visit Chronic Obstructive Pulmonary Disease (COPD), Hypertension (HTN), Other  [Multiple Sclerosis, Paraplegia, Spasticity, Polysubstance Abuse, Generalized Weakness, Non-Ambulatory, Chronic Pain, Caregiver Stress & Fatigue, Hyperlipidemia, Sarcoidosis, Depression.]  General Interventions   General Interventions Discussed/Reviewed General Interventions Discussed, Labs, Vaccines, Doctor Visits, Health Screening, Annual Foot Exam, Lipid Profile, General Interventions Reviewed, Annual Eye Exam, Durable Medical Equipment (DME), Community Resources, Level of Care, Communication with  [Encouraged Routine Engagement with Care Team Members & Providers.]  Labs Hgb A1c every 3 months, Kidney Function  [Encouraged Routine Labwork.]  Vaccines COVID-19, Flu, Pneumonia, RSV, Shingles, Tetanus/Pertussis/Diphtheria  [Encouraged Annual Vaccinations.]  Doctor Visits Discussed/Reviewed Doctor Visits Discussed, Specialist, Doctor Visits Reviewed, Annual Wellness Visits, PCP  [Encouraged Routine Engagement with Care Team Members & Providers.]  Health Screening Bone Density, Colonoscopy, Prostate  [Encouraged Routine Health Screenings.]  Durable Medical Equipment (DME) Bed side commode, BP Cuff, Environmental consultant, Wheelchair, Other  [Prescription Eyeglasses, Sports administrator, Scales, Manufacturing systems engineer, Building surveyor, Cane.]  Probation officer,  Motorized  [Encouraged Routine Use for Safety.]  PCP/Specialist Visits Compliance with follow-up visit  [Encouraged Routine Engagement with Care Team Members & Providers.]  Communication with PCP/Specialists, Charity fundraiser, Pharmacists, Social Work  [employer] Routine Engagement with Care Team Members & Providers.]      Active Listening & Reflection Utilized.  Verbalization of Feelings Encouraged.  Emotional Support Provided. Cognitive Behavioral Therapy Indicated. Client-Centered Therapy Implemented. Acceptance & Commitment Therapy Initiated. Encouraged Engagement with [name], Licensed Clinical Social Worker with [facility], [facility] [phone]), if You Have Questions, Need Assistance, Additional Social Work Needs Are Identified in The Near Future, or If You Change Your Mind About Wanting to Receive Social Work Services.         SDOH assessments and interventions completed:  Yes.  SDOH Interventions Today    Flowsheet Row Most Recent Value  SDOH Interventions   Food Insecurity Interventions Intervention Not Indicated  Housing Interventions Intervention Not Indicated  Transportation Interventions Intervention Not Indicated, Community Resources Provided, Patient Resources (Friends/Family)  Utilities Interventions Intervention Not Indicated  Alcohol Usage Interventions Intervention Not Indicated (Score <7)  Depression Interventions/Treatment  Medication, Counseling, Currently on Treatment, Community Resources Provided  Financial Strain Interventions Intervention Not Indicated  Physical Activity Interventions Intervention Not Indicated, Community Resources Provided  Stress Interventions Intervention Not Indicated, Programmer, applications Provided, [employer], Provide Counseling  Social Connections Interventions Intervention Not Indicated  Health Literacy Interventions Intervention Not Indicated     Care Coordination  Interventions:  Yes, provided.   Follow up plan: No further intervention required.   Encounter Outcome:  Patient Visit Completed.    [name], BSW, MSW, LCSW [facility], [facility] Clinical Social Worker II Direct Dial: [phone]  Fax: [phone] Website: [email or web address]

## 2023-12-21 ENCOUNTER — Other Ambulatory Visit: Payer: Self-pay | Admitting: Neurology

## 2023-12-22 NOTE — Telephone Encounter (Signed)
 Requested Prescriptions   Pending Prescriptions Disp Refills   diazepam (VALIUM) 10 MG tablet [Pharmacy Med Name: DIAZEPAM 10 MG TAB] 120 tablet     Sig: TAKE 1 TABLET BY MOUTH 4 TIMES A DAY AS NEEDED   baclofen (LIORESAL) 20 MG tablet [Pharmacy Med Name: BACLOFEN 20 MG TAB] 90 each 11    Sig: TAKE ONE (1) TABLET BY MOUTH 3 TIMES DAILY   Vitamin D, Ergocalciferol, (DRISDOL) 1.25 MG (50000 UNIT) CAPS capsule [Pharmacy Med Name: VITAMIN D (ERGOCALCIFEROL) 1.25 MG] 13 capsule 1    Sig: TAKE 1 CAPSULE (50,000 UNITS TOTAL) BY MOUTH EVERY 7 (SEVEN) DAYS   Last seen 03/23/23, next appt scheduled 04/19/24  Dispenses   Dispensed Days Supply Quantity Provider Pharmacy  DIAZEPAM 10 MG TABS 11/15/2023 30 120 tablet Sater, Pearletha Furl, MD [facility] - ...  DIAZEPAM 10 MG TABS 10/10/2023 30 120 tablet Sater, Pearletha Furl, MD  [facility] - ...  DIAZEPAM 10 MG TABS 09/02/2023 30 120 tablet Sater, Pearletha Furl, MD [facility] - ...  DIAZEPAM 10 MG TABS 07/22/2023 30 120 tablet Sater, Pearletha Furl, MD [facility] - ...  DIAZEPAM 10 MG TABS 06/15/2023 30 120 tablet Sater, Pearletha Furl, MD [facility] - ...  DIAZEPAM 10 MG TABS 05/11/2023 30 120 tablet Sater, Pearletha Furl, MD [facility] - ...  DIAZEPAM 10 MG TAB 04/06/2023 30 120 each Sater, Pearletha Furl, MD [facility] - ...  DIAZEPAM 10 MG TAB 03/08/2023 30 120 each Sater, Pearletha Furl, MD [facility] - ...  DIAZEPAM 10 MG TAB 02/04/2023 30 120 each Beryle Beams, MD [facility] - ...  DIAZEPAM 10 MG TAB 01/03/2023 30 120 each Beryle Beams, MD [facility] - .Marland KitchenMarland Kitchen

## 2024-01-02 DIAGNOSIS — J441 Chronic obstructive pulmonary disease with (acute) exacerbation: Secondary | ICD-10-CM | POA: Diagnosis not present

## 2024-01-02 DIAGNOSIS — E039 Hypothyroidism, unspecified: Secondary | ICD-10-CM | POA: Diagnosis not present

## 2024-01-02 DIAGNOSIS — E785 Hyperlipidemia, unspecified: Secondary | ICD-10-CM | POA: Diagnosis not present

## 2024-01-23 ENCOUNTER — Telehealth: Payer: Self-pay | Admitting: *Deleted

## 2024-01-23 NOTE — Telephone Encounter (Signed)
 LVM for pt to call to move up follow up appt. Cx 10/2023 appt d/t transportation issues and got r/s to July 2025 but this is too far out. Needing to schedule asap.

## 2024-01-23 NOTE — Telephone Encounter (Signed)
 Pt has called back, he accepted 4-28  arriving at 9:00 for 9:30 appointment, he will call if unable to keep appointment.

## 2024-01-30 ENCOUNTER — Ambulatory Visit: Admitting: Neurology

## 2024-01-30 ENCOUNTER — Encounter: Payer: Self-pay | Admitting: Neurology

## 2024-01-30 VITALS — BP 139/70 | HR 47 | Ht 75.0 in | Wt 240.0 lb

## 2024-01-30 DIAGNOSIS — Z79899 Other long term (current) drug therapy: Secondary | ICD-10-CM | POA: Diagnosis not present

## 2024-01-30 DIAGNOSIS — R3911 Hesitancy of micturition: Secondary | ICD-10-CM | POA: Diagnosis not present

## 2024-01-30 DIAGNOSIS — G35 Multiple sclerosis: Secondary | ICD-10-CM | POA: Diagnosis not present

## 2024-01-30 DIAGNOSIS — Z114 Encounter for screening for human immunodeficiency virus [HIV]: Secondary | ICD-10-CM | POA: Diagnosis not present

## 2024-01-30 DIAGNOSIS — R252 Cramp and spasm: Secondary | ICD-10-CM

## 2024-01-30 DIAGNOSIS — Z993 Dependence on wheelchair: Secondary | ICD-10-CM | POA: Diagnosis not present

## 2024-01-30 DIAGNOSIS — R5383 Other fatigue: Secondary | ICD-10-CM

## 2024-01-30 NOTE — Progress Notes (Signed)
 [facility]  PATIENT: Chad Vasquez DOB: August 07, 1961  REFERRING DOCTOR OR PCP: Minus Amel, MD SOURCE: Patient, note from neurology, imaging and lab reports, MRI images personally reviewed.  _________________________________   HISTORICAL  CHIEF COMPLAINT:  Chief Complaint  Patient presents with   Follow-up    Pt in room 11.alone. Here for MS follow up. Pt c/o bilateral leg achy pain, started last night. Lower back pain. Took ultram as needed. Stopped b12 injections was not to give injection due to right hand weakness.    HISTORY OF PRESENT ILLNESS:  Chad Vasquez is a 63 y.o. 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  Update 01/30/2024 He did Mavenclad July/August and tolerated it well.   He felt better for a few months after he did the Mavenclad.     He feels clinically stable with no new exacerbation He does feel slightly weaker but tries to stay physically active and tries to do some yard-work tasks, despite the wheelchair.  .We had a long discussion about DMT's and goals and options.   He is wheelchair bound but can stand up and transfer to chair, bed, tolilet.     Both legs are weak,  right > left.   He takes baclofen  for spasticity.  He has a seated  elliptical with a motor that works his legs some.   He has a deep achy pain in both legs, worse some nights than others - seems deep in bones  He takes tramadol for leg pain.   Does not want to restart gabapentin .  He reports dysesthesias in his lower legs, right > left .  This is an aggravating pain and sometimes affects sleep.   He feels the pain deep 'i my bones'      He has urinary hesitancy.  He feels he empties.     He denies much urgency.   He has constipation.     He reports vision is fine with glasses  He has fatigue many days.  He tried to stay active and does most chores.  He even chops firewood.    He sleeps poorly some nights.    Mood is ok but sometimes gets  frustrated/aggravated easily.   He denies any significant cognitive issue.    MS History: He was diagnosed with MS in 2010. He had the fairly sudden onset of left leg weakness and had a foot drop.   He was diagnosed with MS after MRI and LP..  he had increased IgG index nd> 5 OCB.   He had an AFO brace and was able to walk and do most things.   In 2013, he woke up with a high fever and he was very weak.   EMS took him to [facility].  A LP was attempted but caused a lot of pain and he is not sure if they obtained  He saw Dr. Dania Dupre initially, then Dr. Salli Crawley then Dr. Joleen Navy more recently and needs to chance as he retired.    He had been on multiple different medications, initially copaxone  until large exacerbation in 2013 or 2014 then Tysabri  in 2015 (Felt poorly and joint pain). And then on Aubagio (2016, stopped due to liver issues), Vumerity (stopped for s.e.).     He also was on Tysabri  at some point.  Ocrevus had been discussed but never started. Aaron Aas  He has had multiple rounds of IV Solumedrol and feels better a few weeks after each one.  He did Y1M1 ad Y1M2 Mavenclad July/August 2024.   Y2 will be July/Aug 2025.   He has sarcoidosis (only pulmonary) and was on steroids.     Imaging: MRI of the brain 07/06/2021 showed multiple T2/FLAIR hyperintense foci in the periventricular, juxtacortical and deep white matter of the cerebral hemispheres, a subtle focus is noted in the left cerebellum near the fourth ventricle.  Small focus in the pons.  None of the foci enhance.  However, compared to the MRI from 04/09/2014 there has been some progression with a couple new foci in the hemispheres and enlargement of other foci.  MRI of the cervical spine 07/06/2021 shows a T2 hyperintense focus within the spinal cord towards the right adjacent to C3-C4 measuring 15 mm in length.  There are degenerative changes at C3-C4 causing mild spinal stenosis and foraminal narrowing.  At C4-C5 there is left foraminal  narrowing at C6-C7, there is bilateral foraminal narrowing.  The spinal cord is stable compared to the MRI from 2014   REVIEW OF SYSTEMS: Constitutional: No fevers, chills, sweats, or change in appetite.  He notes fatigue. Eyes: No visual changes, double vision, eye pain Ear, nose and throat: No hearing loss, ear pain, nasal congestion, sore throat Cardiovascular: No chest pain, palpitations Respiratory:  No shortness of breath at rest or with exertion.   No wheezes GastrointestinaI: No nausea, vomiting, diarrhea, abdominal pain, fecal incontinence Genitourinary: He has urinary hesitancy.   Musculoskeletal:  No neck pain, back pain Integumentary: No rash, pruritus, skin lesions Neurological: as above Psychiatric: Mild depression.  No anxiety. Endocrine: No palpitations, diaphoresis, change in appetite, change in weigh or increased thirst Hematologic/Lymphatic:  No anemia, purpura, petechiae. Allergic/Immunologic: No itchy/runny eyes, nasal congestion, recent allergic reactions, rashes  ALLERGIES: No Known Allergies  HOME MEDICATIONS:  Current Outpatient Medications:    baclofen  (LIORESAL ) 20 MG tablet, TAKE ONE (1) TABLET BY MOUTH 3 TIMES DAILY, Disp: 90 each, Rfl: 11   calcium  carbonate (TUMS - DOSED IN MG ELEMENTAL CALCIUM ) 500 MG chewable tablet, Chew 1 tablet by mouth daily as needed for indigestion or heartburn. Reported on 02/03/2016, Disp: , Rfl:    Cladribine (MAVENCLAD, 10 TABS, PO), Take by mouth. Dose for Month 1: 2 tablets daily for days 1-5, Month 2: 2 tablets daily for days 1-4 and 1 tablet daily for day 5. G4W1: July 2024, Disp: , Rfl:    diazepam  (VALIUM ) 10 MG tablet, TAKE 1 TABLET BY MOUTH 4 TIMES A DAY AS NEEDED, Disp: 120 tablet, Rfl: 5   diazepam  (VALIUM ) 5 MG tablet, Take 1 tablet (5 mg total) by mouth in the morning, at noon, in the evening, and at bedtime., Disp: 120 tablet, Rfl: 5   losartan  (COZAAR ) 100 MG tablet, Take 50 mg by mouth every morning. , Disp: , Rfl:     PROAIR  HFA 108 (90 BASE) MCG/ACT inhaler, INHALE TWO PUFFS INTO THE LUNGS EVERY SIX HOURS AS NEEDED FOR WHEEZING, Disp: 8.5 g, Rfl: 3   traMADol (ULTRAM) 50 MG tablet, Take 2 tablets (100 mg total) by mouth every 6 (six) hours as needed., Disp: 240 tablet, Rfl: 3   Vitamin D , Ergocalciferol , (DRISDOL ) 1.25 MG (50000 UNIT) CAPS capsule, TAKE 1 CAPSULE (50,000 UNITS TOTAL) BY MOUTH EVERY 7 (SEVEN) DAYS, Disp: 13 capsule, Rfl: 1   Armodafinil  150 MG tablet, Take 1 tablet (150 mg total) by mouth daily. (Patient not taking: Reported on 01/30/2024), Disp: 30 tablet, Rfl: 5   Biotin 10000 MCG TABS, Take 50,000 mcg  by mouth daily. (Patient not taking: Reported on 01/30/2024), Disp: , Rfl:    cyanocobalamin  (,VITAMIN B-12,) 1000 MCG/ML injection, Inject 1,000 mcg into the muscle every 30 (thirty) days. (Patient not taking: Reported on 03/23/2023), Disp: , Rfl:    docusate sodium  (COLACE) 100 MG capsule, Take 200 mg by mouth 2 (two) times daily as needed for mild constipation. Reported on 02/03/2016 (Patient not taking: Reported on 01/30/2024), Disp: , Rfl:    Probiotic Product (PROBIOTIC DAILY PO), Take 1 capsule by mouth daily. (Patient not taking: Reported on 01/30/2024), Disp: , Rfl:   PAST MEDICAL HISTORY: Past Medical History:  Diagnosis Date   Anxiety and depression    Arthritis    Chronic pain    COPD (chronic obstructive pulmonary disease) (HCC)    Coronary artery disease    GERD (gastroesophageal reflux disease)    Hiatal hernia    HTN (hypertension)    Hyperlipidemia    Multiple sclerosis (HCC) 2010   Polysubstance abuse (HCC) 2013   Sarcoidosis    2013, ???   Schatzki's ring    mild   Sleep apnea    CPAP    PAST SURGICAL HISTORY: Past Surgical History:  Procedure Laterality Date   CARDIAC CATHETERIZATION  2005   CHOLECYSTECTOMY N/A 04/16/2013   Procedure: LAPAROSCOPIC CHOLECYSTECTOMY;  Surgeon: Beau Bound, MD;  Location: [facility];  Service: General;  Laterality: N/A;    COLONOSCOPY  2011   Dr. Riley Cheadle: normal   ESOPHAGOGASTRODUODENOSCOPY (EGD) WITH PROPOFOL  N/A 02/16/2016   Dr.Rourk- LA grade A esophagitis, small hiatal hernia, normal second portion of the duodenum, mild schatzki ring, no specimens collected.   MALONEY DILATION N/A 02/16/2016   Procedure: Londa Rival DILATION;  Surgeon: Suzette Espy, MD;  Location: [facility] SUITE;  Service: Endoscopy;  Laterality: N/A;   MEDIASTINOSCOPY  05/15/2012   Procedure: MEDIASTINOSCOPY;  Surgeon: Zelphia Higashi, MD;  Location: [facility] OR;  Service: Thoracic;  Laterality: N/A;    FAMILY HISTORY: Family History  Problem Relation Age of Onset   Heart failure Mother    Stroke Mother    Heart attack Mother 12   Heart failure Father    Heart attack Father 67       CABG   Breast cancer Sister    Seizures Brother    Breast cancer Sister    Liver disease Neg Hx    Colon cancer Neg Hx     SOCIAL HISTORY: Social History   Socioeconomic History   Marital status: Significant Other    Spouse name: Milagros Alf   Number of children: 0   Years of education: 12   Highest education level: 12th grade  Occupational History   Occupation: Engineer, water: UNEMPLOYED    Comment: disabled   Tobacco Use   Smoking status: Former    Current packs/day: 0.00    Average packs/day: 1 pack/day for 30.0 years (30.0 ttl pk-yrs)    Types: Cigarettes    Start date: 05/01/1982    Quit date: 05/01/2012    Years since quitting: 11.7    Passive exposure: Past   Smokeless tobacco: Never   Tobacco comments:    Quit x 4 years  Vaping Use   Vaping status: Never Used  Substance and Sexual Activity   Alcohol use: No    Alcohol/week: 0.0 standard drinks of alcohol    Comment: quit 05/01/2012   Drug use: No    Types: Cocaine, Marijuana    Comment:  quit 05/01/2012   Sexual activity: Not Currently    Partners: Female  Other Topics Concern   Not on file  Social History Narrative   Patient lives at home with aid and  significant other   Caffeine Use: 3 cups daily   Pt is left handed    Pt in electric wheelchair    Social Drivers of Health   Financial Resource Strain: Low Risk  (11/28/2023)   Overall Financial Resource Strain (CARDIA)    Difficulty of Paying Living Expenses: Not hard at all  Food Insecurity: No Food Insecurity (11/28/2023)   Hunger Vital Sign    Worried About Running Out of Food in the Last Year: Never true    Ran Out of Food in the Last Year: Never true  Transportation Needs: No Transportation Needs (11/28/2023)   PRAPARE - Administrator, Civil Service (Medical): No    Lack of Transportation (Non-Medical): No  Physical Activity: Sufficiently Active (11/28/2023)   Exercise Vital Sign    Days of Exercise per Week: 5 days    Minutes of Exercise per Session: 50 min  Stress: No Stress Concern Present (11/28/2023)   [employer] of Occupational Health - Occupational Stress Questionnaire    Feeling of Stress : Only a little  Social Connections: Socially Integrated (11/28/2023)   Social Connection and Isolation Panel [NHANES]    Frequency of Communication with Friends and Family: More than three times a week    Frequency of Social Gatherings with Friends and Family: More than three times a week    Attends Religious Services: More than 4 times per year    Active Member of [employer] or Organizations: Yes    Attends Banker Meetings: More than 4 times per year    Marital Status: Living with partner  Intimate Partner Violence: Not At Risk (11/28/2023)   Humiliation, Afraid, Rape, and Kick questionnaire    Fear of Current or Ex-Partner: No    Emotionally Abused: No    Physically Abused: No    Sexually Abused: No       PHYSICAL EXAM  Vitals:   01/30/24 0911  BP: 139/70  Pulse: (!) 47  Weight: 240 lb (108.9 kg)  Height: 6\' 3"  (1.905 m)    Body mass index is 30 kg/m.   General: The patient is well-developed and well-nourished and in no acute  distress  HEENT:  Head is [place]/AT.  Sclera are anicteric.    Skin: Extremities are without rash or  edema.  Neurologic Exam  Mental status: The patient is alert and oriented x 3 at the time of the examination. The patient has apparent normal recent and remote memory, with an apparently normal attention span and concentration ability.   Speech is normal.  Cranial nerves: Extraocular movements are full.   Facial symmetry is present. There is good facial sensation to soft touch bilaterally.Facial strength is normal.  Trapezius and sternocleidomastoid strength is normal. No dysarthria is noted.   . No obvious hearing deficits are noted.  Motor:  Muscle bulk is normal.   Tone is increased in right leg.   5/5 in arms except 4+/5 deltoid, triceps and hand on the right, 3/5 left iliopsoas and left EHL and 4- to 4 elsewhere in leg.  Strength is 2-/5 right iliopsoas and 2+ , 3 in quads, /5 elsewhere in the right leg,    Reduced right RAM in hand.   Sensory: Sensory testing is intact to pinprick, soft touch and  vibration sensation in arms but reduced in vibration in right leg  Coordination: Cerebellar testing reveals good finger-nose-finger and heel-to-shin bilaterally.  Gait and station: Wheelchair bound  Reflexes: Deep tendon reflexes are symmetric and 3 bilaterally.        DIAGNOSTIC DATA (LABS, IMAGING, TESTING) - I reviewed patient records, labs, notes, testing and imaging myself where available.  Lab Results  Component Value Date   WBC 4.1 05/23/2023   HGB 13.5 05/23/2023   HCT 39.7 05/23/2023   MCV 91 05/23/2023   PLT 237 05/23/2023      Component Value Date/Time   NA 141 05/23/2023 1422   K 5.2 05/23/2023 1422   CL 104 05/23/2023 1422   CO2 26 05/23/2023 1422   GLUCOSE 88 05/23/2023 1422   GLUCOSE 154 (H) 07/10/2021 0622   BUN 10 05/23/2023 1422   CREATININE 0.85 05/23/2023 1422   CALCIUM  9.5 05/23/2023 1422   PROT 6.2 05/23/2023 1422   ALBUMIN 4.3 05/23/2023 1422   AST 29  05/23/2023 1422   ALT 30 05/23/2023 1422   ALKPHOS 72 05/23/2023 1422   BILITOT 0.4 05/23/2023 1422   GFRNONAA >60 07/10/2021 0622   GFRAA >60 04/29/2017 1559   Lab Results  Component Value Date   CHOL 167 07/07/2021   HDL 51 07/07/2021   LDLCALC 103 (H) 07/07/2021   TRIG 66 07/07/2021   CHOLHDL 3.3 07/07/2021   Lab Results  Component Value Date   HGBA1C 5.4 07/07/2021   Lab Results  Component Value Date   VITAMINB12 1,155 (H) 07/07/2021   Lab Results  Component Value Date   TSH 0.144 (L) 07/07/2021       ASSESSMENT AND PLAN  MS (multiple sclerosis) (HCC) - Plan: HIV Antibody (routine testing w rflx), QuantiFERON-TB Gold Plus, Hep B Surface Antigen, Hepatitis B Core AB, Total, CBC with Differential/Platelet, Comprehensive metabolic panel with GFR  High risk medication use - Plan: HIV Antibody (routine testing w rflx), QuantiFERON-TB Gold Plus, Hep B Surface Antigen, Hepatitis B Core AB, Total, CBC with Differential/Platelet, Comprehensive metabolic panel with GFR  Wheelchair dependence  Urinary hesitancy  Spasticity  Other fatigue  Encounter for screening for HIV - Plan: HIV Antibody (routine testing w rflx)   Mavenclad for his active SPMS.  Year 2 in July - check labs, Continue baclofen  and valium  for spasticity Trial of Provigil  for MS related fatigue 4.   Rtc 4 months and will check CBC/D ad LFT at that [facility]   Call sooner if new or worsening issues   This visit is part of a comprehensive longitudinal care medical relationship regarding the patients primary diagnosis of MS and related concerns.   Felisha Claytor A. Godwin Lat, MD, [facility] 01/30/2024, 10:29 AM Certified in Neurology, Clinical Neurophysiology, Sleep Medicine and Neuroimaging  [facility] [address] [place], [place] [address] [phone]

## 2024-02-02 LAB — QUANTIFERON-TB GOLD PLUS
QuantiFERON Mitogen Value: 1.1 [IU]/mL
QuantiFERON Nil Value: 0.05 [IU]/mL
QuantiFERON TB1 Ag Value: 0.06 [IU]/mL
QuantiFERON TB2 Ag Value: 0.05 [IU]/mL

## 2024-02-02 LAB — COMPREHENSIVE METABOLIC PANEL WITH GFR
ALT: 18 IU/L (ref 0–44)
AST: 21 IU/L (ref 0–40)
Albumin: 4.5 g/dL (ref 3.9–4.9)
Alkaline Phosphatase: 83 IU/L (ref 44–121)
BUN/Creatinine Ratio: 11 (ref 10–24)
BUN: 9 mg/dL (ref 8–27)
Bilirubin Total: 0.4 mg/dL (ref 0.0–1.2)
CO2: 27 mmol/L (ref 20–29)
Calcium: 9.6 mg/dL (ref 8.6–10.2)
Chloride: 103 mmol/L (ref 96–106)
Creatinine, Ser: 0.83 mg/dL (ref 0.76–1.27)
Globulin, Total: 2.3 g/dL (ref 1.5–4.5)
Glucose: 87 mg/dL (ref 70–99)
Potassium: 4.3 mmol/L (ref 3.5–5.2)
Sodium: 142 mmol/L (ref 134–144)
Total Protein: 6.8 g/dL (ref 6.0–8.5)
eGFR: 98 mL/min/{1.73_m2} (ref >59)

## 2024-02-02 LAB — CBC WITH DIFFERENTIAL/PLATELET
Basophils Absolute: 0 10*3/uL (ref 0.0–0.2)
Basos: 1 %
EOS (ABSOLUTE): 0.1 10*3/uL (ref 0.0–0.4)
Eos: 2 %
Hematocrit: 40.4 % (ref 37.5–51.0)
Hemoglobin: 13.4 g/dL (ref 13.0–17.7)
Immature Grans (Abs): 0 10*3/uL (ref 0.0–0.1)
Immature Granulocytes: 0 %
Lymphocytes Absolute: 1.1 10*3/uL (ref 0.7–3.1)
Lymphs: 24 %
MCH: 30.9 pg (ref 26.6–33.0)
MCHC: 33.2 g/dL (ref 31.5–35.7)
MCV: 93 fL (ref 79–97)
Monocytes Absolute: 0.5 10*3/uL (ref 0.1–0.9)
Monocytes: 11 %
Neutrophils Absolute: 3 10*3/uL (ref 1.4–7.0)
Neutrophils: 62 %
Platelets: 186 10*3/uL (ref 150–450)
RBC: 4.34 x10E6/uL (ref 4.14–5.80)
RDW: 12.7 % (ref 11.6–15.4)
WBC: 4.8 10*3/uL (ref 3.4–10.8)

## 2024-02-02 LAB — HEPATITIS B CORE ANTIBODY, TOTAL: Hep B Core Total Ab: NEGATIVE

## 2024-02-02 LAB — HIV ANTIBODY (ROUTINE TESTING W REFLEX): HIV Screen 4th Generation wRfx: NONREACTIVE

## 2024-02-02 LAB — HEPATITIS B SURFACE ANTIGEN: Hepatitis B Surface Ag: NEGATIVE

## 2024-02-03 ENCOUNTER — Telehealth: Payer: Self-pay | Admitting: *Deleted

## 2024-02-03 NOTE — Telephone Encounter (Signed)
 Left message for patient to call.

## 2024-02-03 NOTE — Telephone Encounter (Signed)
Patient informed the below.

## 2024-02-03 NOTE — Telephone Encounter (Signed)
-----   Message from Jorie Newness sent at 02/02/2024  5:13 PM EDT ----- Labs look good to go ahead and do the second year of Mavenclad

## 2024-02-06 ENCOUNTER — Telehealth: Payer: Self-pay | Admitting: *Deleted

## 2024-02-06 NOTE — Telephone Encounter (Signed)
Faxed completed/signed Mavenclad start form to [facility] at [phone]. Received fax confirmation.

## 2024-03-15 NOTE — Telephone Encounter (Signed)
 PA for [facility] 10mg  tablets completed via [facility] and sent to [facility]. Key: [id number]. Should have a determination within 3-5 business days.

## 2024-03-19 NOTE — Telephone Encounter (Signed)
 I have informed Chad Vasquez at [facility] of this approval.

## 2024-04-03 NOTE — Telephone Encounter (Signed)
 I have reached out to Javie at [facility] for an update on this case.

## 2024-04-04 NOTE — Telephone Encounter (Signed)
 Per Javie at [facility]: [id number] Chad Vasquez [birth year] - the srf stated that pt isn't ready to start and to f/u on 04/25/24.

## 2024-04-18 ENCOUNTER — Telehealth: Payer: Self-pay | Admitting: Neurology

## 2024-04-18 NOTE — Telephone Encounter (Signed)
 Pt is asking for a call from RN to discuss his 2nd part of Mavenclad, he states he has not heard from anyone.

## 2024-04-18 NOTE — Telephone Encounter (Signed)
 Called pt back and let him know that we are calling Chad Vasquez at [facility] to get the process started for his Second round of Mavenclad and that they will get in contact with him.

## 2024-04-18 NOTE — Telephone Encounter (Signed)
 Per start form, pt should be starting around 04/30/24. PA approved. I called pt care coordinator, Javie. Confirmed pt cleared to start therapy and start date 04/30/24. She will send prescription to [facility]. Copay: 12.15.  She will also call the pt to go over things.

## 2024-04-19 ENCOUNTER — Ambulatory Visit: Payer: Medicare Other | Admitting: Neurology

## 2024-05-01 ENCOUNTER — Telehealth: Payer: Self-pay | Admitting: Neurology

## 2024-05-01 NOTE — Telephone Encounter (Signed)
 Tatiana(pt care advocate) @ [facility] is asking RN to call their pharmacy at [phone] [email or web address]

## 2024-05-01 NOTE — Telephone Encounter (Signed)
 Called number provided by O[facility] but this was incorrect.  I called [phone] instead and spoke w/ Reniel.   Pt should take as follows: 2 tablets po on days 1-5 for month 1, 2 tabs on days 1-4 and 1 tablet po on day 5. See below from start form sent for yr 2.   Medication scheduled to be delivered to pt today. Rejection showing on their end. He reached out to pharmacy to see if clarification needed. Pharmacist confirmed no clarification needed. However, medication on hold d/t being in fulfillment processing (checking insurance). Relayed PA approved until 03/15/2025. PA Case ID #: [id number]. He noted approval info. They will contact pt to r/s delivery if it is not delivered today.  Total time of call: 20 min 35 sec

## 2024-05-02 NOTE — Telephone Encounter (Signed)
 I called patient.  He received his Mavenclad today.  Unfortunately, the dosing instructions were not included.  I advised him that for the first month he should take 2 tablets for 5 days.  For his second month he should take 2 tablets for days 1 through 4 and 1 tablet on day 5.  I reminded him to call [facility] in a few weeks if he has not heard from them to schedule the shipment of the second month of Mavenclad.  I advised him that I would check on him in about a week to see how he did with his first month of Mavenclad.  Patient verbalized understanding of dosing instructions and wrote them down.  He will reach out to us  or [facility] with any questions or concerns.

## 2024-05-07 NOTE — Telephone Encounter (Signed)
 Called pt back. He took Y2 Month 1: 05/03/24-05/07/24. Reports after about 45 min of taking medication, feels more fatigued, tightness in chest, headache behind eyes. He lays down in dark room with eyes shut. Sx resolved on its own.   I placed him on hold and spoke w/ Dr. Vear who agreed for him to continue monitoring since sx resolved. He is ok to proceed with Month 2 as planned. I relayed to pt. He will call [facility] at [phone] to schedule shipment. He will call back if he has any trouble receiving Month 2.

## 2024-05-07 NOTE — Telephone Encounter (Signed)
 Pt has called to report that he has completed his 1st box of Mavenclad, he is asking for a call to discuss what he is expected to do now

## 2024-05-14 DIAGNOSIS — R279 Unspecified lack of coordination: Secondary | ICD-10-CM | POA: Diagnosis not present

## 2024-05-15 ENCOUNTER — Telehealth: Payer: Self-pay | Admitting: Neurology

## 2024-05-15 NOTE — Telephone Encounter (Signed)
 Pt reports he is not feeling well, he fell from his chair yesterday and had EMS to come out to get him off the floor. Please call pt to discuss his concerns

## 2024-05-15 NOTE — Telephone Encounter (Signed)
 Called pt. Relayed Dr. Duncan recommendations. He wants to think more about options and will call back to let us  know how he wants to proceed. I encouraged he not wait to go to ED if sx severe. He verbalized understanding. He will call us  back.

## 2024-05-15 NOTE — Telephone Encounter (Signed)
 Called pt at [phone]. Yesterday, got up at 5:15a. 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 Went out to feed and clean out litter box. Was leaning over to scoop litter box and he fell out of chair into small space. EMS came and helped him up. They wanted to take him to [facility] and he declined. Denies hitting head. Has pain in legs post fall, describes as burning sensation that radiates down legs. Denies breaking anything.   Currently lays in bed most of the time. When he sits in chair, feels wobbly. Feels equilibrium off. Leans forward, off balance.  Standing and transferring, unable to do this. Previously he could. Has ongoing fatigue, intermittent chest tightness in the morning mostly, constant mild headache behind eyes.  Aware I will have Dr. Vear review and will call back with recommendation.  He finished Y2 Month 1 Mavenclad: 05/03/24-05/07/24.

## 2024-05-25 ENCOUNTER — Other Ambulatory Visit: Payer: Self-pay | Admitting: Neurology

## 2024-05-31 NOTE — Telephone Encounter (Signed)
 Took call from Jael/phone room and spoke with pt. He did not remember speaking with me 05/15/24 and the recommendations made. He went back through what we discussed that day.   States he is still having ongoing weakness, severe fatigue/wants to sleep a lot. Has received Mavenclad Y2M2 but hesitant to take it d/t sx he developed after M1. He is open to MRI's but wants to complete somewhere close to home. He will need to find help getting to and from appt. Aware I will discuss with MD and call back.

## 2024-05-31 NOTE — Telephone Encounter (Addendum)
 I spoke w/ Dr. Vear who feels sx unlikely r/t Mavenclad. He does recommend pt move forward with Yr2M2. If sx continue or worsen, he would then recommend MRI's. Pt should check to see which location close to home he would like to go if this occurs.   I called pt back. Relayed above recommendation. He will plan to start [facility] 06/06/24. If sx worsen, he will call back.

## 2024-06-05 ENCOUNTER — Other Ambulatory Visit: Payer: Self-pay | Admitting: Neurology

## 2024-06-06 NOTE — Telephone Encounter (Signed)
 Last seen on 01/30/24 Follow up scheduled on 09/10/24    Dispensed Days Supply Quantity Provider Pharmacy  TRAMADOL HCL 50 MG TABS 03/10/2024 30 240 tablet Sater, Charlie LABOR, MD [facility] -       Rx pending to be signed

## 2024-06-07 ENCOUNTER — Telehealth: Payer: Self-pay | Admitting: Neurology

## 2024-06-07 DIAGNOSIS — I739 Peripheral vascular disease, unspecified: Secondary | ICD-10-CM | POA: Diagnosis not present

## 2024-06-07 DIAGNOSIS — R531 Weakness: Secondary | ICD-10-CM

## 2024-06-07 DIAGNOSIS — G35 Multiple sclerosis: Secondary | ICD-10-CM

## 2024-06-07 DIAGNOSIS — M1990 Unspecified osteoarthritis, unspecified site: Secondary | ICD-10-CM | POA: Diagnosis not present

## 2024-06-07 NOTE — Telephone Encounter (Signed)
 Left message for patient to call ( see telephone encounter on 05/15/24 regarding MRI)

## 2024-06-07 NOTE — Telephone Encounter (Signed)
 Before pt starts his 2nd box of MAVENCLAD, 9 TABS, 10 MG TBPK  he'd like to know if he should have a MRI , also he'd like to know if he is able to get any vaccinations while taking the 2nd box of this medication, please call.

## 2024-06-11 ENCOUNTER — Telehealth: Payer: Self-pay | Admitting: Neurology

## 2024-06-11 NOTE — Telephone Encounter (Signed)
 Patient returned phone and would like a call back.

## 2024-06-11 NOTE — Telephone Encounter (Signed)
 Called pt back at [phone]. Relayed per Dr. Vear that he should wait 4 months from last dose of Mavenclad before getting any vaccination. He plans to take second moth Mavenclad starting 06/14/24.   Pt agreeable to proceed with MRI Cervical/lumbar wo recommended by Dr. Vear. Orders placed for pt to complete at [facility] per pt request.

## 2024-06-11 NOTE — Telephone Encounter (Signed)
 [employer] medicare shara: [id number] exp. 06/11/24-07/10/24 sent to [facility] [phone]

## 2024-06-12 ENCOUNTER — Telehealth: Payer: Self-pay | Admitting: Neurology

## 2024-06-12 NOTE — Telephone Encounter (Signed)
 Pt called  to request call back from MD the patient  wants to  know  Pt should  continue going through treatment  before Mri appt or after Mri appt

## 2024-06-12 NOTE — Telephone Encounter (Signed)
 Called and spoke with pt. Relayed he should start Mavenclad as planned 06/14/24 and will complete MRI's 0917/25. Aware we will call to go over results once back.

## 2024-06-18 NOTE — Telephone Encounter (Signed)
 I called patient to make sure his second month of Mavenclad went well. No answer, left a message asking him to call us  back.

## 2024-06-19 MED ORDER — ARMODAFINIL 150 MG PO TABS: 150.0000 mg | ORAL_TABLET | Freq: Every day | ORAL | 0 refills | Status: AC | PRN

## 2024-06-19 NOTE — Telephone Encounter (Signed)
 ROUTING TO WORK IN PROVIDER TO ESCRIBE AS DR. SATER WANTED SINCE HE IS OUT OF OFFICE  Called and spoke to pt and stated:  Pt stated that he didn't have any left. Please escribe pt stated that he finished mavenclad yesterday Last seen 01/30/24 Next appt 09/10/24 Dispenses  No dispenses within 180 days of the adherence period (since 06/16/2023)

## 2024-06-19 NOTE — Telephone Encounter (Signed)
 Update after pt took month 2 of Mavenclad

## 2024-06-19 NOTE — Telephone Encounter (Signed)
 Armodafinil  prescribed for as needed use.

## 2024-06-19 NOTE — Telephone Encounter (Signed)
 Pt has called stating he was returning a call to Charlotte Hall, Charity fundraiser.  Phone rep reached her and was informed she just wanted to make sure his month two went okay.  Pt's response was :month 2 didnt go as well as the last, he has finished it. It affected him a lot different from 1st time last year, these last 2 boxes drained his energy level, had to lay around and rest after, will add to phone note

## 2024-06-19 NOTE — Addendum Note (Signed)
 Addended by: ONEITA HOIST E on: 06/19/2024 01:00 PM   Modules accepted: Orders

## 2024-06-19 NOTE — Addendum Note (Signed)
 Addended by: Davanna He on: 06/19/2024 05:04 PM   Modules accepted: Orders

## 2024-06-20 ENCOUNTER — Ambulatory Visit (HOSPITAL_COMMUNITY)
Admission: RE | Admit: 2024-06-20 | Discharge: 2024-06-20 | Disposition: A | Discharge status: Home / Self Care | Source: Ambulatory Visit | Attending: Neurology | Admitting: Neurology

## 2024-06-20 DIAGNOSIS — G35 Multiple sclerosis: Secondary | ICD-10-CM | POA: Insufficient documentation

## 2024-06-20 DIAGNOSIS — M542 Cervicalgia: Secondary | ICD-10-CM | POA: Diagnosis not present

## 2024-06-20 DIAGNOSIS — R531 Weakness: Secondary | ICD-10-CM | POA: Insufficient documentation

## 2024-06-20 DIAGNOSIS — M51369 Other intervertebral disc degeneration, lumbar region without mention of lumbar back pain or lower extremity pain: Secondary | ICD-10-CM | POA: Diagnosis not present

## 2024-06-20 NOTE — Telephone Encounter (Signed)
 Phone room: please call pt and schedule sooner appt. Can offer 08/07/24 at 4pm with Dr Vear

## 2024-06-20 NOTE — Telephone Encounter (Signed)
 Please call back. Appt needed 2 months after finishing Mavenclad. Please offer to do mychart VV if he can do this instead. Same date/time. He would need to sign up for mychart. If unable, please ask pt what dates/times work better for him to come to office. Needs to be in November

## 2024-06-20 NOTE — Telephone Encounter (Signed)
 Patient also needs a 2 mo follow up.

## 2024-06-21 DIAGNOSIS — R070 Pain in throat: Secondary | ICD-10-CM | POA: Diagnosis not present

## 2024-06-21 NOTE — Telephone Encounter (Signed)
 Noted [name]- FYI

## 2024-06-21 NOTE — Telephone Encounter (Signed)
 Phone room: please try to call pt again today

## 2024-06-25 ENCOUNTER — Telehealth: Payer: Self-pay | Admitting: Neurology

## 2024-06-25 ENCOUNTER — Ambulatory Visit: Payer: Self-pay | Admitting: Neurology

## 2024-06-25 NOTE — Telephone Encounter (Signed)
 Called pt and relayed MRI results per Dr. Obie note. Pt verbalized understanding.  He does not feel he needs referral to Neurosurgeon/ortho right now. He will call back if this changes for us  to place referral.

## 2024-06-25 NOTE — Telephone Encounter (Signed)
 Pt has returned call to [facility]  re: an appointment

## 2024-07-02 ENCOUNTER — Other Ambulatory Visit (HOSPITAL_COMMUNITY): Payer: Self-pay

## 2024-07-02 ENCOUNTER — Telehealth: Payer: Self-pay

## 2024-07-02 NOTE — Telephone Encounter (Signed)
 Pharmacy Patient Advocate Encounter   Received notification from CoverMyMeds that prior authorization for Armodafinil  150mg  Tablets is required/requested.   Insurance verification completed.   The patient is insured through [facility] .   Per test claim: PA required; PA submitted to above mentioned insurance via CoverMyMeds Key/confirmation #/EOC Armodafinil  150MG  tablets Status is pending

## 2024-07-03 ENCOUNTER — Encounter (HOSPITAL_COMMUNITY): Payer: Self-pay

## 2024-07-03 ENCOUNTER — Emergency Department (HOSPITAL_COMMUNITY)
Admission: EM | Admit: 2024-07-03 | Discharge: 2024-07-03 | Disposition: A | Discharge status: Home / Self Care | Attending: Emergency Medicine | Admitting: Emergency Medicine

## 2024-07-03 ENCOUNTER — Other Ambulatory Visit (HOSPITAL_COMMUNITY): Payer: Self-pay

## 2024-07-03 ENCOUNTER — Other Ambulatory Visit: Payer: Self-pay

## 2024-07-03 DIAGNOSIS — M542 Cervicalgia: Secondary | ICD-10-CM | POA: Insufficient documentation

## 2024-07-03 DIAGNOSIS — D72819 Decreased white blood cell count, unspecified: Secondary | ICD-10-CM | POA: Diagnosis not present

## 2024-07-03 DIAGNOSIS — H9201 Otalgia, right ear: Secondary | ICD-10-CM | POA: Insufficient documentation

## 2024-07-03 DIAGNOSIS — M792 Neuralgia and neuritis, unspecified: Secondary | ICD-10-CM | POA: Insufficient documentation

## 2024-07-03 LAB — COMPREHENSIVE METABOLIC PANEL WITH GFR
ALT: 18 U/L (ref 0–44)
AST: 23 U/L (ref 15–41)
Albumin: 4.2 g/dL (ref 3.5–5.0)
Alkaline Phosphatase: 65 U/L (ref 38–126)
Anion gap: 10 (ref 5–15)
BUN: 7 mg/dL — ABNORMAL LOW (ref 8–23)
CO2: 26 mmol/L (ref 22–32)
Calcium: 9.7 mg/dL (ref 8.9–10.3)
Chloride: 104 mmol/L (ref 98–111)
Creatinine, Ser: 0.69 mg/dL (ref 0.61–1.24)
GFR, Estimated: >60 mL/min (ref >60)
Glucose, Bld: 80 mg/dL (ref 70–99)
Potassium: 3.9 mmol/L (ref 3.5–5.1)
Sodium: 140 mmol/L (ref 135–145)
Total Bilirubin: 0.3 mg/dL (ref 0.0–1.2)
Total Protein: 6.6 g/dL (ref 6.5–8.1)

## 2024-07-03 LAB — CBC WITH DIFFERENTIAL/PLATELET
Abs Immature Granulocytes: 0.01 K/uL (ref 0.00–0.07)
Basophils Absolute: 0 K/uL (ref 0.0–0.1)
Basophils Relative: 0 %
Eosinophils Absolute: 0.1 K/uL (ref 0.0–0.5)
Eosinophils Relative: 5 %
HCT: 36.9 % — ABNORMAL LOW (ref 39.0–52.0)
Hemoglobin: 12.2 g/dL — ABNORMAL LOW (ref 13.0–17.0)
Immature Granulocytes: 0 %
Lymphocytes Relative: 14 %
Lymphs Abs: 0.4 K/uL — ABNORMAL LOW (ref 0.7–4.0)
MCH: 31.8 pg (ref 26.0–34.0)
MCHC: 33.1 g/dL (ref 30.0–36.0)
MCV: 96.1 fL (ref 80.0–100.0)
Monocytes Absolute: 0.6 K/uL (ref 0.1–1.0)
Monocytes Relative: 22 %
Neutro Abs: 1.6 K/uL — ABNORMAL LOW (ref 1.7–7.7)
Neutrophils Relative %: 59 %
Platelets: 168 K/uL (ref 150–400)
RBC: 3.84 MIL/uL — ABNORMAL LOW (ref 4.22–5.81)
RDW: 15 % (ref 11.5–15.5)
WBC: 2.7 K/uL — ABNORMAL LOW (ref 4.0–10.5)
nRBC: 0 % (ref 0.0–0.2)

## 2024-07-03 MED ORDER — GABAPENTIN 100 MG PO CAPS
200.0000 mg | ORAL_CAPSULE | Freq: Once | ORAL | Status: AC
  Administered 2024-07-03: 200 mg via ORAL
  Filled 2024-07-03: qty 2

## 2024-07-03 MED ORDER — GABAPENTIN 300 MG PO CAPS: ORAL_CAPSULE | ORAL | 0 refills | Status: AC

## 2024-07-03 NOTE — ED Provider Notes (Signed)
 [place] EMERGENCY DEPARTMENT AT [facility] Provider Note   CSN: [id number] Arrival date & time: 07/03/24  9095     Patient presents with: Otalgia   Chad Vasquez is a 63 y.o. male.  {Add pertinent medical, surgical, social history, OB history to YEP:67052} Patient has a history of MS.  Patient complains of shooting pain running up his neck to his ear off-and-on for a number of weeks.  The pain does not last long.   Otalgia      Prior to Admission medications   Medication Sig Start Date End Date Taking? Authorizing Provider  gabapentin  (NEURONTIN ) 300 MG capsule Take 1 every 8 hours as needed for your pain 07/03/24  Yes Suzette Pac, MD  Armodafinil  150 MG tablet Take 1 tablet (150 mg total) by mouth daily as needed. 06/19/24   Athar, Saima, MD  baclofen  (LIORESAL ) 20 MG tablet TAKE ONE (1) TABLET BY MOUTH 3 TIMES DAILY 12/22/23   Sater, Charlie LABOR, MD  Biotin 89999 MCG TABS Take 50,000 mcg by mouth daily. Patient not taking: Reported on 01/30/2024    [provider]  calcium  carbonate (TUMS - DOSED IN MG ELEMENTAL CALCIUM ) 500 MG chewable tablet Chew 1 tablet by mouth daily as needed for indigestion or heartburn. Reported on 02/03/2016    [provider]  Cladribine (MAVENCLAD, 10 TABS, PO) Take by mouth. Dose for Month 1: 2 tablets daily for days 1-5, Month 2: 2 tablets daily for days 1-4 and 1 tablet daily for day 5. Y1M1: July 2024    Vear Charlie LABOR, MD  cyanocobalamin  (,VITAMIN B-12,) 1000 MCG/ML injection Inject 1,000 mcg into the muscle every 30 (thirty) days. Patient not taking: Reported on 03/23/2023 06/05/21   [provider]  diazepam  (VALIUM ) 10 MG tablet TAKE 1 TABLET BY MOUTH 4 TIMES A DAY AS NEEDED 12/22/23   Sater, Charlie LABOR, MD  diazepam  (VALIUM ) 5 MG tablet Take 1 tablet (5 mg total) by mouth in the morning, at noon, in the evening, and at bedtime. 03/08/23   Sater, Charlie LABOR, MD  docusate sodium  (COLACE) 100 MG capsule Take 200  mg by mouth 2 (two) times daily as needed for mild constipation. Reported on 02/03/2016 Patient not taking: Reported on 01/30/2024    [provider]  losartan  (COZAAR ) 100 MG tablet Take 50 mg by mouth every morning.     [provider]  MAVENCLAD, 9 TABS, 10 MG TBPK TAKE AS DIRECTED PER PACKAGE INSTRUCTIONS FOR MONTH 2 05/29/24   Sater, Charlie LABOR, MD  PROAIR  HFA 108 (90 BASE) MCG/ACT inhaler INHALE TWO PUFFS INTO THE LUNGS EVERY SIX HOURS AS NEEDED FOR WHEEZING 04/08/14   Byrum, Lamar RAMAN, MD  Probiotic Product (PROBIOTIC DAILY PO) Take 1 capsule by mouth daily. Patient not taking: Reported on 01/30/2024    [provider]  traMADol (ULTRAM) 50 MG tablet Take 2 tablets (100 mg total) by mouth every 6 (six) hours as needed. 06/06/24   Sater, Charlie LABOR, MD  Vitamin D , Ergocalciferol , (DRISDOL ) 1.25 MG (50000 UNIT) CAPS capsule TAKE 1 CAPSULE (50,000 UNITS TOTAL) BY MOUTH EVERY 7 (SEVEN) DAYS 12/22/23   Sater, Charlie LABOR, MD    Allergies: Patient has no known allergies.    Review of Systems  HENT:  Positive for ear pain.     Updated Vital Signs BP (!) 149/70   Pulse 74   Temp 97.9 F (36.6 C) (Oral)   Resp 17   Ht 6' 3 (  1.905 m)   Wt 108.9 kg   SpO2 97%   BMI 30.01 kg/m   Physical Exam  (all labs ordered are listed, but only abnormal results are displayed) Labs Reviewed  CBC WITH DIFFERENTIAL/PLATELET - Abnormal; Notable for the following components:      Result Value   WBC 2.7 (*)    RBC 3.84 (*)    Hemoglobin 12.2 (*)    HCT 36.9 (*)    Neutro Abs 1.6 (*)    Lymphs Abs 0.4 (*)    All other components within normal limits  COMPREHENSIVE METABOLIC PANEL WITH GFR - Abnormal; Notable for the following components:   BUN 7 (*)    All other components within normal limits    EKG: None  Radiology: No results found.  {Document cardiac monitor, telemetry assessment procedure when appropriate:32947} Procedures   Medications Ordered in the ED  gabapentin   (NEURONTIN ) capsule 200 mg (has no administration in time range)      {Click here for ABCD2, HEART and other calculators REFRESH Note before signing:1}                              Medical Decision Making Amount and/or Complexity of Data Reviewed Labs: ordered.  Risk Prescription drug management.   Patient with neuritis to his right ear and neck.  Will placed on Neurontin  and told to follow-up with his neurologist  {Document critical care time when appropriate  Document review of labs and clinical decision tools ie CHADS2VASC2, etc  Document your independent review of radiology images and any outside records  Document your discussion with family members, caretakers and with consultants  Document social determinants of health affecting pt's care  Document your decision making why or why not admission, treatments were needed:32947:::1}   Final diagnoses:  Neuralgia    ED Discharge Orders          Ordered    gabapentin  (NEURONTIN ) 300 MG capsule        07/03/24 1401

## 2024-07-03 NOTE — ED Triage Notes (Signed)
 Pt arrived via [facility] from home c/o right ear pain that began around 06/20/2024. Pt reports pain radiates down his neck and up to his head. Pt also had a MRI on the 17th for his MS.

## 2024-07-03 NOTE — Telephone Encounter (Signed)
 Thank you, can you let pt know med not covered for dx and discuss goodrx coupon as option to fill instead?

## 2024-07-03 NOTE — Telephone Encounter (Signed)
 I have sent a MC to the PT to make him aware. This medication is not on the [facility] list.

## 2024-07-03 NOTE — Telephone Encounter (Signed)
 Pharmacy Patient Advocate Encounter  Received notification from [facility] that Prior Authorization for Armodafinil  has been DENIED.  Full denial letter will be uploaded to the media tab. See denial reason below.   PA #/Case ID/Reference #: Member Number: [id number]

## 2024-07-03 NOTE — Discharge Instructions (Signed)
 Follow-up with your neurologist in 2 to 3 weeks for recheck

## 2024-07-03 NOTE — Telephone Encounter (Signed)
 Chiquita From [facility] called stating that Pt PA was denied . They are faxing over Paperwork  to inform MD   Chiquita callback number is [phone] Option 5

## 2024-07-03 NOTE — Telephone Encounter (Signed)
 Chad Vasquez- have you receive denial?

## 2024-07-11 ENCOUNTER — Telehealth: Payer: Self-pay | Admitting: Neurology

## 2024-07-11 NOTE — Telephone Encounter (Signed)
 Dr. Vear- do you want to review results and make sure you have nothing further to add than what we previously discussed?

## 2024-07-11 NOTE — Telephone Encounter (Signed)
 Pt called wanting to discuss his MRI results that he had done in September. Please advise.

## 2024-07-12 DIAGNOSIS — H6991 Unspecified Eustachian tube disorder, right ear: Secondary | ICD-10-CM | POA: Diagnosis not present

## 2024-07-12 DIAGNOSIS — R070 Pain in throat: Secondary | ICD-10-CM | POA: Diagnosis not present

## 2024-07-16 ENCOUNTER — Other Ambulatory Visit: Payer: Self-pay | Admitting: Neurology

## 2024-07-17 NOTE — Telephone Encounter (Signed)
 Last seen on 01/30/24 Follow up scheduled on 08/08/24   Dispensed Days Supply Quantity Provider Pharmacy  DIAZEPAM  10 MG TABS 06/05/2024 30 120 tablet Sater, Charlie LABOR, MD [facility] - ...     Rx pending to be signed

## 2024-08-07 ENCOUNTER — Ambulatory Visit (INDEPENDENT_AMBULATORY_CARE_PROVIDER_SITE_OTHER): Admitting: Otolaryngology

## 2024-08-07 ENCOUNTER — Encounter (INDEPENDENT_AMBULATORY_CARE_PROVIDER_SITE_OTHER): Payer: Self-pay | Admitting: Otolaryngology

## 2024-08-07 VITALS — BP 143/86 | HR 77 | Temp 97.8°F | Ht 75.0 in | Wt 200.0 lb

## 2024-08-07 DIAGNOSIS — R070 Pain in throat: Secondary | ICD-10-CM

## 2024-08-07 DIAGNOSIS — K219 Gastro-esophageal reflux disease without esophagitis: Secondary | ICD-10-CM | POA: Diagnosis not present

## 2024-08-07 MED ORDER — PANTOPRAZOLE SODIUM 40 MG PO TBEC: 40.0000 mg | DELAYED_RELEASE_TABLET | Freq: Every day | ORAL | 3 refills | Status: AC

## 2024-08-07 NOTE — Progress Notes (Signed)
 Reason for Consult: Right ear pain Referring Physician: Arshdeep Bolger is an 63 y.o. male.  HPI: History of eating a broccoli latter part of August and it got stuck on the right side.  The next day he had pain in the right side of his throat.  It seemed to get worse with a fairly significant amount of discomfort.  He was still able to get food and water down.  He then developed a intermittent right ear pain.  He was seen in the emergency room and told that is probably fluid and he was given a nasal steroid spray.  He has now been a month using that steroid spray.  His pain in his throat is gone and the ear pain is resolved as well.  He now basically has mucus feeling the throat, a tightness feeling and throat clearing.  He does have a history of reflux.  Past Medical History:  Diagnosis Date   Anxiety and depression    Arthritis    Chronic pain    COPD (chronic obstructive pulmonary disease) (HCC)    Coronary artery disease    GERD (gastroesophageal reflux disease)    Hiatal hernia    HTN (hypertension)    Hyperlipidemia    Multiple sclerosis 2010   Polysubstance abuse (HCC) 2013   Sarcoidosis    2013, ???   Schatzki's ring    mild   Sleep apnea    CPAP    Past Surgical History:  Procedure Laterality Date   CARDIAC CATHETERIZATION  2005   CHOLECYSTECTOMY N/A 04/16/2013   Procedure: LAPAROSCOPIC CHOLECYSTECTOMY;  Surgeon: Oneil DELENA Budge, MD;  Location: [facility] ORS;  Service: General;  Laterality: N/A;   COLONOSCOPY  2011   Dr. Shaaron: normal   ESOPHAGOGASTRODUODENOSCOPY (EGD) WITH PROPOFOL  N/A 02/16/2016   Dr.Rourk- LA grade A esophagitis, small hiatal hernia, normal second portion of the duodenum, mild schatzki ring, no specimens collected.   MALONEY DILATION N/A 02/16/2016   Procedure: AGAPITO DILATION;  Surgeon: Lamar CHRISTELLA Shaaron, MD;  Location: [facility] SUITE;  Service: Endoscopy;  Laterality: N/A;   MEDIASTINOSCOPY  05/15/2012   Procedure: MEDIASTINOSCOPY;  Surgeon: Elspeth JAYSON Millers, MD;  Location: [facility] OR;  Service: Thoracic;  Laterality: N/A;    Family History  Problem Relation Age of Onset   Heart failure Mother    Stroke Mother    Heart attack Mother 52   Heart failure Father    Heart attack Father 7       CABG   Breast cancer Sister    Seizures Brother    Breast cancer Sister    Liver disease Neg Hx    Colon cancer Neg Hx     Social History:  reports that he quit smoking about 12 years ago. His smoking use included cigarettes. He started smoking about 42 years ago. He has a 30 pack-year smoking history. He has been exposed to tobacco smoke. He has never used smokeless tobacco. He reports that he does not drink alcohol and does not use drugs.  Allergies: No Known Allergies  Medications: I have reviewed the patient's current medications.  No results found for this or any previous visit (from the past 48 hours).  No results found.  ROS Blood pressure (!) 143/86, pulse 77, temperature 97.8 F (36.6 C), height 6' 3 (1.905 m), weight 200 lb (90.7 kg), SpO2 95%. Physical Exam Constitutional:      Appearance: Normal appearance.  HENT:     Head: Normocephalic  and atraumatic.     Right Ear: Tympanic membrane is without lesions and middle ear aerated, ear canal and external ear normal.     Left Ear: Tympanic membrane is without lesions and middle ear aerated, ear canal and external ear normal.     Nose: Nose normal. Turbinates with mild hypertrophy, No significant swelling or masses.     Oral cavity/oropharynx: Mucous membranes are moist. No lesions or masses    Larynx: normal voice. Mirror attempted without success    Eyes:     Extraocular Movements: Extraocular movements intact.     Conjunctiva/sclera: Conjunctivae normal.     Pupils: Pupils are equal, round, and reactive to light.  Cardiovascular:     Rate and Rhythm: Normal rate.  Pulmonary:     Effort: Pulmonary effort is normal.  Musculoskeletal:     Cervical back: Normal range of  motion and neck supple. No rigidity.  Lymphadenopathy:     Cervical: No cervical adenopathy or masses.salivary glands without lesions. .  Neurological:     Mental Status: He is alert. CN 2-12 intact. No nystagmus      Assessment/Plan: Pharyngeal irritation/laryngal pharyngeal reflux-I think his healing process from the injury from the broccoli was delayed by the reflux problem that he is having.  All his symptoms are now gone except for a mucus feeling in the throat and throat clearing.  I think that a temporary reflux treatment would help him with the mucus and I started him on Protonix  for a month.  If he continues with symptoms then we will proceed with further workup.  We talked about a fiberoptic exam today but he preferred to hold off since he is essentially resolved.  Norleen Notice 08/07/2024, 2:13 PM

## 2024-08-08 ENCOUNTER — Ambulatory Visit: Admitting: Neurology

## 2024-08-08 ENCOUNTER — Encounter: Payer: Self-pay | Admitting: Neurology

## 2024-08-08 VITALS — BP 118/76 | HR 67

## 2024-08-08 DIAGNOSIS — R252 Cramp and spasm: Secondary | ICD-10-CM | POA: Diagnosis not present

## 2024-08-08 DIAGNOSIS — E538 Deficiency of other specified B group vitamins: Secondary | ICD-10-CM

## 2024-08-08 DIAGNOSIS — Z79899 Other long term (current) drug therapy: Secondary | ICD-10-CM | POA: Diagnosis not present

## 2024-08-08 DIAGNOSIS — E559 Vitamin D deficiency, unspecified: Secondary | ICD-10-CM | POA: Diagnosis not present

## 2024-08-08 DIAGNOSIS — R5383 Other fatigue: Secondary | ICD-10-CM

## 2024-08-08 DIAGNOSIS — G35C1 Active secondary progressive multiple sclerosis: Secondary | ICD-10-CM | POA: Diagnosis not present

## 2024-08-08 DIAGNOSIS — Z993 Dependence on wheelchair: Secondary | ICD-10-CM | POA: Diagnosis not present

## 2024-08-08 DIAGNOSIS — G35D Multiple sclerosis, unspecified: Secondary | ICD-10-CM | POA: Insufficient documentation

## 2024-08-08 MED ORDER — TAMSULOSIN HCL 0.4 MG PO CAPS: 0.4000 mg | ORAL_CAPSULE | Freq: Every day | ORAL | 11 refills | Status: AC

## 2024-08-08 NOTE — Progress Notes (Signed)
 [facility]  PATIENT: Chad Vasquez DOB: 1961-03-19  REFERRING DOCTOR OR PCP: Norleen General, MD SOURCE: Patient, note from neurology, imaging and lab reports, MRI images personally reviewed.  _________________________________   HISTORICAL  CHIEF COMPLAINT:  Chief Complaint  Patient presents with   RM11/MS    Pt is here Alone. Pt states that things have been fairly well with his MS. Pt states he is having trouble with memory, bladder and constipation. Pt states he has a burning sensation in his right leg that last for a little while and fades off sometimes.     HISTORY OF PRESENT ILLNESS:  Chad Vasquez is a 63 y.o. 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  Update 08/08/2024 He did Mavenclad July/August 2024 and second year July/August 2025.  He tolerated the first year well but had insomnia and myalgias the second year.  He now feels back to baseline    He feels clinically stable with no new exacerbation He does feel slightly weaker but tries to stay physically active and tries to do some yard-work tasks, despite the wheelchair.   He also feels his memory is a little worse.   He is wheelchair bound but can stand up and transfer to chair, bed, tolilet.     Both legs are weak,  right > left.   He takes baclofen  for spasticity.  He has a seated  elliptical with a motor that works his legs some.   He has deep pain in the legs R>L and a burning sensation.   Does not want to restart gabapentin  as it had not help and felt poorly.    He has benefit from tramadol and takes 50 mg qAM, 100 mg po qHS and occasioanl tablets if days are worse.  Pain is usually more bothersome at night  He has urinary hesitancy.  He feels he sometimes does not empty.     He has mild urgency.   He has constipation.     He reports vision is fine with glasses  He has fatigue many days.  He tried to stay active and does most chores.  He even chops firewood.    He  sleeps poorly some nights.    Mood is ok but sometimes gets frustrated/aggravated easily.   He denies any significant cognitive issue.    We discussed safety:  Uses a shower chair and has several grab bars in bathroom.  Has phone in pouch that would be reachable if he falls.   MS History: He was diagnosed with MS in 2010. He had the fairly sudden onset of left leg weakness and had a foot drop.   He was diagnosed with MS after MRI and LP..  he had increased IgG index nd> 5 OCB.   He had an AFO brace and was able to walk and do most things.   In 2013, he woke up with a high fever and he was very weak.   EMS took him to [facility].  A LP was attempted but caused a lot of pain and he is not sure if they obtained  He saw Dr. Maurice initially, then Dr. Margaret then Dr. Milton more recently and needs to chance as he retired.    He had been on multiple different medications, initially copaxone  until large exacerbation in 2013 or 2014 then Tysabri  in 2015 (Felt poorly and joint pain). And then on Aubagio (2016, stopped due to liver issues), Vumerity (stopped for s.e.).  He also was on Tysabri  at some point.  Ocrevus had been discussed but never started. SABRA  He has had multiple rounds of IV Solumedrol and feels better a few weeks after each one.    He did Y1M1 ad Y1M2 Mavenclad July/August 2024.   Y2 was July/Aug 2025.   He has sarcoidosis (only pulmonary) and was on steroids.     Imaging: MRI of the brain 07/06/2021 showed multiple T2/FLAIR hyperintense foci in the periventricular, juxtacortical and deep white matter of the cerebral hemispheres, a subtle focus is noted in the left cerebellum near the fourth ventricle.  Small focus in the pons.  None of the foci enhance.  However, compared to the MRI from 04/09/2014 there has been some progression with a couple new foci in the hemispheres and enlargement of other foci.  MRI of the cervical spine 07/06/2021 shows a T2 hyperintense focus within the  spinal cord towards the right adjacent to C3-C4 measuring 15 mm in length.  There are degenerative changes at C3-C4 causing mild spinal stenosis and foraminal narrowing.  At C4-C5 there is left foraminal narrowing at C6-C7, there is bilateral foraminal narrowing.  The spinal cord is stable compared to the MRI from 2014   REVIEW OF SYSTEMS: Constitutional: No fevers, chills, sweats, or change in appetite.  He notes fatigue. Eyes: No visual changes, double vision, eye pain Ear, nose and throat: No hearing loss, ear pain, nasal congestion, sore throat Cardiovascular: No chest pain, palpitations Respiratory:  No shortness of breath at rest or with exertion.   No wheezes GastrointestinaI: No nausea, vomiting, diarrhea, abdominal pain, fecal incontinence Genitourinary: He has urinary hesitancy.   Musculoskeletal:  No neck pain, back pain Integumentary: No rash, pruritus, skin lesions Neurological: as above Psychiatric: Mild depression.  No anxiety. Endocrine: No palpitations, diaphoresis, change in appetite, change in weigh or increased thirst Hematologic/Lymphatic:  No anemia, purpura, petechiae. Allergic/Immunologic: No itchy/runny eyes, nasal congestion, recent allergic reactions, rashes  ALLERGIES: No Known Allergies  HOME MEDICATIONS:  Current Outpatient Medications:    baclofen  (LIORESAL ) 20 MG tablet, TAKE ONE (1) TABLET BY MOUTH 3 TIMES DAILY, Disp: 90 each, Rfl: 11   calcium  carbonate (TUMS - DOSED IN MG ELEMENTAL CALCIUM ) 500 MG chewable tablet, Chew 1 tablet by mouth daily as needed for indigestion or heartburn. Reported on 02/03/2016, Disp: , Rfl:    diazepam  (VALIUM ) 10 MG tablet, TAKE 1 TABLET BY MOUTH 4 TIMES A DAY AS NEEDED, Disp: 120 tablet, Rfl: 5   docusate sodium  (COLACE) 100 MG capsule, Take 200 mg by mouth 2 (two) times daily as needed for mild constipation. Reported on 02/03/2016, Disp: , Rfl:    losartan  (COZAAR ) 100 MG tablet, Take 50 mg by mouth every morning. , Disp: ,  Rfl:    pantoprazole  (PROTONIX ) 40 MG tablet, Take 1 tablet (40 mg total) by mouth daily., Disp: 30 tablet, Rfl: 3   PROAIR  HFA 108 (90 BASE) MCG/ACT inhaler, INHALE TWO PUFFS INTO THE LUNGS EVERY SIX HOURS AS NEEDED FOR WHEEZING, Disp: 8.5 g, Rfl: 3   tamsulosin (FLOMAX) 0.4 MG CAPS capsule, Take 1 capsule (0.4 mg total) by mouth daily., Disp: 30 capsule, Rfl: 11   traMADol (ULTRAM) 50 MG tablet, Take 2 tablets (100 mg total) by mouth every 6 (six) hours as needed., Disp: 240 tablet, Rfl: 3   Vitamin D , Ergocalciferol , (DRISDOL ) 1.25 MG (50000 UNIT) CAPS capsule, TAKE 1 CAPSULE (50,000 UNITS TOTAL) BY MOUTH EVERY 7 (SEVEN) DAYS, Disp: 13 capsule, Rfl:  1   Armodafinil  150 MG tablet, Take 1 tablet (150 mg total) by mouth daily as needed. (Patient not taking: Reported on 08/08/2024), Disp: 30 tablet, Rfl: 0   Biotin 89999 MCG TABS, Take 50,000 mcg by mouth daily. (Patient not taking: Reported on 08/08/2024), Disp: , Rfl:    Cladribine (MAVENCLAD, 10 TABS, PO), Take by mouth. Dose for Month 1: 2 tablets daily for days 1-5, Month 2: 2 tablets daily for days 1-4 and 1 tablet daily for day 5. Y1M1: July 2024 (Patient not taking: Reported on 08/08/2024), Disp: , Rfl:    cyanocobalamin  (,VITAMIN B-12,) 1000 MCG/ML injection, Inject 1,000 mcg into the muscle every 30 (thirty) days. (Patient not taking: Reported on 08/08/2024), Disp: , Rfl:    diazepam  (VALIUM ) 5 MG tablet, Take 1 tablet (5 mg total) by mouth in the morning, at noon, in the evening, and at bedtime. (Patient not taking: Reported on 08/08/2024), Disp: 120 tablet, Rfl: 5   gabapentin  (NEURONTIN ) 300 MG capsule, Take 1 every 8 hours as needed for your pain (Patient not taking: Reported on 08/08/2024), Disp: 60 capsule, Rfl: 0   MAVENCLAD, 9 TABS, 10 MG TBPK, TAKE AS DIRECTED PER PACKAGE INSTRUCTIONS FOR MONTH 2 (Patient not taking: Reported on 08/08/2024), Disp: 9 each, Rfl: 0   Probiotic Product (PROBIOTIC DAILY PO), Take 1 capsule by mouth daily. (Patient  not taking: Reported on 08/08/2024), Disp: , Rfl:   PAST MEDICAL HISTORY: Past Medical History:  Diagnosis Date   Anxiety and depression    Arthritis    Chronic pain    COPD (chronic obstructive pulmonary disease) (HCC)    Coronary artery disease    GERD (gastroesophageal reflux disease)    Hiatal hernia    HTN (hypertension)    Hyperlipidemia    Multiple sclerosis 2010   Polysubstance abuse (HCC) 2013   Sarcoidosis    2013, ???   Schatzki's ring    mild   Sleep apnea    CPAP    PAST SURGICAL HISTORY: Past Surgical History:  Procedure Laterality Date   CARDIAC CATHETERIZATION  2005   CHOLECYSTECTOMY N/A 04/16/2013   Procedure: LAPAROSCOPIC CHOLECYSTECTOMY;  Surgeon: Oneil DELENA Budge, MD;  Location: [facility];  Service: General;  Laterality: N/A;   COLONOSCOPY  2011   Dr. Shaaron: normal   ESOPHAGOGASTRODUODENOSCOPY (EGD) WITH PROPOFOL  N/A 02/16/2016   Dr.Rourk- LA grade A esophagitis, small hiatal hernia, normal second portion of the duodenum, mild schatzki ring, no specimens collected.   MALONEY DILATION N/A 02/16/2016   Procedure: AGAPITO DILATION;  Surgeon: Lamar CHRISTELLA Shaaron, MD;  Location: [facility];  Service: Endoscopy;  Laterality: N/A;   MEDIASTINOSCOPY  05/15/2012   Procedure: MEDIASTINOSCOPY;  Surgeon: Elspeth JAYSON Millers, MD;  Location: [facility] OR;  Service: Thoracic;  Laterality: N/A;    FAMILY HISTORY: Family History  Problem Relation Age of Onset   Heart failure Mother    Stroke Mother    Heart attack Mother 106   Heart failure Father    Heart attack Father 3       CABG   Breast cancer Sister    Seizures Brother    Breast cancer Sister    Liver disease Neg Hx    Colon cancer Neg Hx     SOCIAL HISTORY: Social History   Socioeconomic History   Marital status: Significant Other    Spouse name: Johnston Proud   Number of children: 0   Years of education: 12   Highest education level: 12th  grade  Occupational History   Occupation: Banker: UNEMPLOYED    Comment: disabled   Tobacco Use   Smoking status: Former    Current packs/day: 0.00    Average packs/day: 1 pack/day for 30.0 years (30.0 ttl pk-yrs)    Types: Cigarettes    Start date: 05/01/1982    Quit date: 05/01/2012    Years since quitting: 12.2    Passive exposure: Past   Smokeless tobacco: Never   Tobacco comments:    Quit x 4 years  Vaping Use   Vaping status: Never Used  Substance and Sexual Activity   Alcohol use: No    Alcohol/week: 0.0 standard drinks of alcohol    Comment: quit 05/01/2012   Drug use: No    Types: Cocaine, Marijuana    Comment: quit 05/01/2012   Sexual activity: Not Currently    Partners: Female  Other Topics Concern   Not on file  Social History Narrative   Patient lives at home with aid and significant other   Caffeine Use: 3 cups daily   Pt is left handed    Pt in electric wheelchair    Social Drivers of Health   Financial Resource Strain: Low Risk  (11/28/2023)   Overall Financial Resource Strain (CARDIA)    Difficulty of Paying Living Expenses: Not hard at all  Food Insecurity: No Food Insecurity (06/21/2024)   Received from [facility]   Hunger Vital Sign    Within the past 12 months, you worried that your food would run out before you got the money to buy more.: Never true    Within the past 12 months, the food you bought just didn't last and you didn't have money to get more.: Never true  Transportation Needs: No Transportation Needs (06/21/2024)   [facility] from [facility]   PRAPARE - Transportation    Lack of Transportation (Medical): No    Lack of Transportation (Non-Medical): No  Physical Activity: Sufficiently Active (11/28/2023)   Exercise Vital Sign    Days of Exercise per Week: 5 days    Minutes of Exercise per Session: 50 min  Stress: No Stress Concern Present (11/28/2023)   [employer] of Occupational Health - Occupational Stress Questionnaire    Feeling of Stress : Only a little   Social Connections: Socially Integrated (11/28/2023)   Social Connection and Isolation Panel    Frequency of Communication with Friends and Family: More than three times a week    Frequency of Social Gatherings with Friends and Family: More than three times a week    Attends Religious Services: More than 4 times per year    Active Member of [employer] or Organizations: Yes    Attends Engineer, Structural: More than 4 times per year    Marital Status: Living with partner  Intimate Partner Violence: Not At Risk (11/28/2023)   Humiliation, Afraid, Rape, and Kick questionnaire    Fear of Current or Ex-Partner: No    Emotionally Abused: No    Physically Abused: No    Sexually Abused: No       PHYSICAL EXAM  Vitals:   08/08/24 1051  BP: 118/76  Pulse: 67  SpO2: 98%    There is no height or weight on file to calculate BMI.   General: The patient is well-developed and well-nourished and in no acute distress  HEENT:  Head is [place]/AT.  Sclera are anicteric.    Skin: Extremities  are without rash or  edema.  Neurologic Exam  Mental status: The patient is alert and oriented x 3 at the time of the examination. The patient has apparent normal recent and remote memory, with an apparently normal attention span and concentration ability.   Speech is normal.  Cranial nerves: Extraocular movements are full.   Facial symmetry is present. There is good facial sensation to soft touch bilaterally.Facial strength is normal.  Trapezius and sternocleidomastoid strength is normal. No dysarthria is noted.   . No obvious hearing deficits are noted.  Motor:  Muscle bulk is normal.   Tone is increased in right leg.   5/5 in arms except 4+/5 deltoid, triceps and hand on the right, 3/5 left iliopsoas and left EHL and 4- to 4 elsewhere in leg.  Strength is 2-/5 right iliopsoas and 2+ , 3 in quads, /5 elsewhere in the right leg,    he has reduced rapid alternating movements in the right hand..   Sensory:  Sensory testing is intact to pinprick, soft touch and vibration sensation in arms but reduced in vibration in right leg  Coordination: Cerebellar testing reveals good finger-nose-finger and heel-to-shin bilaterally.  Gait and station: He is wheelchair bound.  Gait was not tested.  Reflexes: Deep tendon reflexes are symmetric and 3 bilaterally.        DIAGNOSTIC DATA (LABS, IMAGING, TESTING) - I reviewed patient records, labs, notes, testing and imaging myself where available.  Lab Results  Component Value Date   WBC 2.7 (L) 07/03/2024   HGB 12.2 (L) 07/03/2024   HCT 36.9 (L) 07/03/2024   MCV 96.1 07/03/2024   PLT 168 07/03/2024      Component Value Date/Time   NA 140 07/03/2024 0954   NA 142 01/30/2024 1005   K 3.9 07/03/2024 0954   CL 104 07/03/2024 0954   CO2 26 07/03/2024 0954   GLUCOSE 80 07/03/2024 0954   BUN 7 (L) 07/03/2024 0954   BUN 9 01/30/2024 1005   CREATININE 0.69 07/03/2024 0954   CALCIUM  9.7 07/03/2024 0954   PROT 6.6 07/03/2024 0954   PROT 6.8 01/30/2024 1005   ALBUMIN 4.2 07/03/2024 0954   ALBUMIN 4.5 01/30/2024 1005   AST 23 07/03/2024 0954   ALT 18 07/03/2024 0954   ALKPHOS 65 07/03/2024 0954   BILITOT 0.3 07/03/2024 0954   BILITOT 0.4 01/30/2024 1005   GFRNONAA >60 07/03/2024 0954   GFRAA >60 04/29/2017 1559   Lab Results  Component Value Date   CHOL 167 07/07/2021   HDL 51 07/07/2021   LDLCALC 103 (H) 07/07/2021   TRIG 66 07/07/2021   CHOLHDL 3.3 07/07/2021   Lab Results  Component Value Date   HGBA1C 5.4 07/07/2021   Lab Results  Component Value Date   VITAMINB12 1,155 (H) 07/07/2021   Lab Results  Component Value Date   TSH 0.144 (L) 07/07/2021       ASSESSMENT AND PLAN  Active secondary progressive multiple sclerosis - Plan: CBC with Differential/Platelet, Hepatic function panel  High risk medication use - Plan: CBC with Differential/Platelet, Hepatic function panel  Wheelchair dependence  Spasticity  Other  fatigue  B12 deficiency - Plan: Vitamin B12  Vitamin D  deficiency - Plan: VITAMIN D  25 Hydroxy (Vit-D Deficiency, Fractures)   Mavenclad for his active SPMS.  He has not completed both years of Mavenclad.  Will check a CBC with differential and liver function test.,  Additionally we need to check vitamin D  and B12 as he has had  deficiencies in the past. Continue baclofen  and valium  for spasticity Tamsulosin for urinary hesitancy 4.   Rtc 8 months  Call sooner if new or worsening issues   This visit is part of a comprehensive longitudinal care medical relationship regarding the patients primary diagnosis of MS and related concerns.   Clerance Umland A. Vear, MD, [facility] 08/08/2024, 7:30 PM Certified in Neurology, Clinical Neurophysiology, Sleep Medicine and Neuroimaging  [facility] [address], [address] [place], [place] [address] [phone]

## 2024-08-09 ENCOUNTER — Ambulatory Visit: Payer: Self-pay | Admitting: Neurology

## 2024-08-09 LAB — CBC WITH DIFFERENTIAL/PLATELET
Basophils Absolute: 0 x10E3/uL (ref 0.0–0.2)
Basos: 0 %
EOS (ABSOLUTE): 0.4 x10E3/uL (ref 0.0–0.4)
Eos: 11 %
Hematocrit: 38.9 % (ref 37.5–51.0)
Hemoglobin: 12.7 g/dL — ABNORMAL LOW (ref 13.0–17.7)
Immature Grans (Abs): 0 x10E3/uL (ref 0.0–0.1)
Immature Granulocytes: 0 %
Lymphocytes Absolute: 0.5 x10E3/uL — ABNORMAL LOW (ref 0.7–3.1)
Lymphs: 15 %
MCH: 31.8 pg (ref 26.6–33.0)
MCHC: 32.6 g/dL (ref 31.5–35.7)
MCV: 98 fL — ABNORMAL HIGH (ref 79–97)
Monocytes Absolute: 0.4 x10E3/uL (ref 0.1–0.9)
Monocytes: 13 %
Neutrophils Absolute: 2 x10E3/uL (ref 1.4–7.0)
Neutrophils: 61 %
Platelets: 164 x10E3/uL (ref 150–450)
RBC: 3.99 x10E6/uL — ABNORMAL LOW (ref 4.14–5.80)
RDW: 14.4 % (ref 11.6–15.4)
WBC: 3.3 x10E3/uL — ABNORMAL LOW (ref 3.4–10.8)

## 2024-08-09 LAB — HEPATIC FUNCTION PANEL
ALT: 15 IU/L (ref 0–44)
AST: 24 IU/L (ref 0–40)
Albumin: 4.2 g/dL (ref 3.9–4.9)
Alkaline Phosphatase: 81 IU/L (ref 47–123)
Bilirubin Total: 0.3 mg/dL (ref 0.0–1.2)
Bilirubin, Direct: 0.14 mg/dL (ref 0.00–0.40)
Total Protein: 6.4 g/dL (ref 6.0–8.5)

## 2024-08-09 LAB — VITAMIN B12: Vitamin B-12: 485 pg/mL (ref 232–1245)

## 2024-08-09 LAB — VITAMIN D 25 HYDROXY (VIT D DEFICIENCY, FRACTURES): Vit D, 25-Hydroxy: 73.4 ng/mL (ref 30.0–100.0)

## 2024-08-15 DIAGNOSIS — R7309 Other abnormal glucose: Secondary | ICD-10-CM | POA: Diagnosis not present

## 2024-08-15 DIAGNOSIS — E039 Hypothyroidism, unspecified: Secondary | ICD-10-CM | POA: Diagnosis not present

## 2024-08-15 DIAGNOSIS — E559 Vitamin D deficiency, unspecified: Secondary | ICD-10-CM | POA: Diagnosis not present

## 2024-08-15 DIAGNOSIS — E785 Hyperlipidemia, unspecified: Secondary | ICD-10-CM | POA: Diagnosis not present

## 2024-08-15 DIAGNOSIS — G35D Multiple sclerosis, unspecified: Secondary | ICD-10-CM | POA: Diagnosis not present

## 2024-08-15 DIAGNOSIS — I10 Essential (primary) hypertension: Secondary | ICD-10-CM | POA: Diagnosis not present

## 2024-08-15 DIAGNOSIS — Z0000 Encounter for general adult medical examination without abnormal findings: Secondary | ICD-10-CM | POA: Diagnosis not present

## 2024-08-15 DIAGNOSIS — Z23 Encounter for immunization: Secondary | ICD-10-CM | POA: Diagnosis not present

## 2024-08-15 DIAGNOSIS — Z125 Encounter for screening for malignant neoplasm of prostate: Secondary | ICD-10-CM | POA: Diagnosis not present

## 2024-08-29 ENCOUNTER — Other Ambulatory Visit: Payer: Self-pay | Admitting: Neurology

## 2024-08-29 NOTE — Telephone Encounter (Signed)
 Dr.Sater did you want to continue Rx?  Last vitamin d  check was 08/08/24 was 73.4

## 2024-09-10 ENCOUNTER — Ambulatory Visit: Admitting: Neurology

## 2025-03-18 ENCOUNTER — Ambulatory Visit: Admitting: Neurology
